# Patient Record
Sex: Female | Born: 1945 | Race: White | Hispanic: No
Health system: Southern US, Community
[De-identification: ages and names within clinical notes are randomized; demographics above are authoritative.]

## PROBLEM LIST (undated history)

## (undated) DIAGNOSIS — M069 Rheumatoid arthritis, unspecified: Secondary | ICD-10-CM

## (undated) DIAGNOSIS — M519 Unspecified thoracic, thoracolumbar and lumbosacral intervertebral disc disorder: Secondary | ICD-10-CM

## (undated) DIAGNOSIS — N189 Chronic kidney disease, unspecified: Secondary | ICD-10-CM

## (undated) DIAGNOSIS — E039 Hypothyroidism, unspecified: Secondary | ICD-10-CM

## (undated) DIAGNOSIS — F32A Depression, unspecified: Secondary | ICD-10-CM

## (undated) DIAGNOSIS — A6920 Lyme disease, unspecified: Secondary | ICD-10-CM

## (undated) DIAGNOSIS — J189 Pneumonia, unspecified organism: Secondary | ICD-10-CM

## (undated) DIAGNOSIS — G709 Myoneural disorder, unspecified: Secondary | ICD-10-CM

## (undated) DIAGNOSIS — M1990 Unspecified osteoarthritis, unspecified site: Secondary | ICD-10-CM

## (undated) DIAGNOSIS — Z87442 Personal history of urinary calculi: Secondary | ICD-10-CM

## (undated) DIAGNOSIS — M797 Fibromyalgia: Secondary | ICD-10-CM

## (undated) DIAGNOSIS — J342 Deviated nasal septum: Secondary | ICD-10-CM

## (undated) DIAGNOSIS — R5382 Chronic fatigue, unspecified: Secondary | ICD-10-CM

## (undated) DIAGNOSIS — N3010 Interstitial cystitis (chronic) without hematuria: Secondary | ICD-10-CM

## (undated) DIAGNOSIS — J449 Chronic obstructive pulmonary disease, unspecified: Secondary | ICD-10-CM

## (undated) DIAGNOSIS — M810 Age-related osteoporosis without current pathological fracture: Secondary | ICD-10-CM

## (undated) DIAGNOSIS — D649 Anemia, unspecified: Secondary | ICD-10-CM

## (undated) DIAGNOSIS — F419 Anxiety disorder, unspecified: Secondary | ICD-10-CM

## (undated) DIAGNOSIS — K219 Gastro-esophageal reflux disease without esophagitis: Secondary | ICD-10-CM

## (undated) DIAGNOSIS — F329 Major depressive disorder, single episode, unspecified: Secondary | ICD-10-CM

## (undated) DIAGNOSIS — J45909 Unspecified asthma, uncomplicated: Secondary | ICD-10-CM

## (undated) HISTORY — PX: EYE SURGERY: SHX253

## (undated) HISTORY — DX: Anxiety disorder, unspecified: F41.9

## (undated) HISTORY — DX: Unspecified asthma, uncomplicated: J45.909

## (undated) HISTORY — PX: BREAST SURGERY: SHX581

## (undated) HISTORY — DX: Chronic fatigue, unspecified: R53.82

---

## 1976-02-12 HISTORY — PX: APPENDECTOMY: SHX54

## 1996-02-12 HISTORY — PX: OTHER SURGICAL HISTORY: SHX169

## 1999-07-27 ENCOUNTER — Encounter: Payer: Self-pay | Admitting: Urology

## 1999-07-27 ENCOUNTER — Ambulatory Visit (HOSPITAL_COMMUNITY): Admission: RE | Admit: 1999-07-27 | Discharge: 1999-07-27 | Payer: Self-pay | Admitting: Urology

## 1999-11-19 ENCOUNTER — Encounter: Payer: Self-pay | Admitting: Urology

## 1999-11-19 ENCOUNTER — Encounter: Admission: RE | Admit: 1999-11-19 | Discharge: 1999-11-19 | Payer: Self-pay | Admitting: Urology

## 2003-02-12 HISTORY — PX: COSMETIC SURGERY: SHX468

## 2007-07-16 ENCOUNTER — Other Ambulatory Visit: Admission: RE | Admit: 2007-07-16 | Discharge: 2007-07-16 | Payer: Self-pay | Admitting: Obstetrics & Gynecology

## 2007-10-23 ENCOUNTER — Encounter: Admission: RE | Admit: 2007-10-23 | Discharge: 2007-10-23 | Payer: Self-pay | Admitting: Internal Medicine

## 2007-12-18 ENCOUNTER — Encounter: Admission: RE | Admit: 2007-12-18 | Discharge: 2007-12-18 | Payer: Self-pay | Admitting: Internal Medicine

## 2009-06-12 ENCOUNTER — Encounter: Admission: RE | Admit: 2009-06-12 | Discharge: 2009-08-10 | Payer: Self-pay | Admitting: Internal Medicine

## 2009-07-21 ENCOUNTER — Ambulatory Visit (HOSPITAL_COMMUNITY): Admission: RE | Admit: 2009-07-21 | Discharge: 2009-07-21 | Payer: Self-pay | Admitting: Internal Medicine

## 2010-06-29 NOTE — Op Note (Signed)
[facility]  Patient:    Pamela Larson, Pamela Larson                     MRN: [id number] Proc. Date: 07/27/99 Adm. Date:  [id number] Disc. Date: [id number] Attending:  Londell Moh CC:         [name], M.D.             Eliezer Bottom, M.D.                           Operative Report  SERVICE:  Urology.  PREOPERATIVE DIAGNOSES:  Chronic painful bladder syndrome consistent with interstitial cystitis. Severe clinical depression.  POSTOPERATIVE DIAGNOSES:  Chronic painful bladder syndrome consistent with interstitial cystitis. Severe clinical depression.  PROCEDURES:  Cystoscopy, urethral calibration and hydrodistention of the bladder, Marcaine and Pyridium installation, Marcaine and Kenalog injections.  SURGEON:  [name], M.D.  ANESTHESIA:  General.  COMPLICATIONS:  None.  DRAINS:  None.  BRIEF HISTORY:  This 65 year old female is known to have interstitial cystitis based on her initial history and physical which showed somewhat diminished bladder capacity along with glomerulations. The patient has been on combination therapy consisting of Elmiron and Atarax plus other medications. The patient has required several different antidepressants because of severe clinical depression and is actually out on disability due to both her bladder symptoms and her depression. The patients IC symptoms have flared significantly recently despite the fact that she has not changed her medications. She has requested that hydrodistention be performed. We note that she has done intravesical therapy in the past and recent course of DMSL was not helpful. The understands the risks and benefits of the procedure including the possibility that there could be no improvement in her symptoms with distention. She also is aware of the fact that she will have at least 2 weeks of increasing symptoms prior to any effect from the procedure. She gave full and informed  consent.  DESCRIPTION OF PROCEDURE:  After successful induction of general anesthesia, the patient was placed in the dorsal lithotomy position, prepped with Betadine and draped in the usual sterile fashion. The urethra was calibrated to 30 [place] with [employer] sounds. There was no evidence of stenosis or stricture. The cystoscope was inserted, the bladder was carefully inspected and was free of any tumor stones. Both ureteral orifices normal in configuration and location. A previous biopsy site was noted. This was unremarkable in its appearance. The patients bladder was then filled to a pressure of ______ water for 5 minutes and when the bladder was drained no real glomerulations were seen. This has been noted on her last hydrodistention. It appears that through the use of Elmiron therapy, her ______ has stabilized and that glomerulations are no longer present. The capacity is now increased up to the normal level and is at 1100 cc. This would suggest that she has had a strong response to the medication program. The patients bladder was drained and a mixture of Marcaine and Pyridium was left in the bladder. Marcaine and Kenalog were injected periurethrally, vaginal gauze was applied. She was given a Toradol injection. She was taken to the recovery room in good condition. She will give a B&O suppository as well as appropriate supportive pain medicine. She will be sent home with a prescription for Lorcet plus to taken as needed for pain and doxycycline. She will continue on all her other medications. The decision  as to the appropriate antidepressive therapy will need to be made by Dr. Evelene Croon and we will make no changes in her protocol at this time. D:  07/27/99 TD:  07/31/99 Job: [id number]

## 2011-02-12 DIAGNOSIS — M519 Unspecified thoracic, thoracolumbar and lumbosacral intervertebral disc disorder: Secondary | ICD-10-CM

## 2011-02-12 HISTORY — DX: Unspecified thoracic, thoracolumbar and lumbosacral intervertebral disc disorder: M51.9

## 2011-02-27 DIAGNOSIS — F4323 Adjustment disorder with mixed anxiety and depressed mood: Secondary | ICD-10-CM | POA: Diagnosis not present

## 2011-03-12 DIAGNOSIS — B370 Candidal stomatitis: Secondary | ICD-10-CM | POA: Diagnosis not present

## 2011-03-12 DIAGNOSIS — R5382 Chronic fatigue, unspecified: Secondary | ICD-10-CM | POA: Diagnosis not present

## 2011-03-12 DIAGNOSIS — R05 Cough: Secondary | ICD-10-CM | POA: Diagnosis not present

## 2011-03-13 DIAGNOSIS — F4323 Adjustment disorder with mixed anxiety and depressed mood: Secondary | ICD-10-CM | POA: Diagnosis not present

## 2011-04-19 DIAGNOSIS — M5126 Other intervertebral disc displacement, lumbar region: Secondary | ICD-10-CM | POA: Diagnosis not present

## 2011-05-06 DIAGNOSIS — M5126 Other intervertebral disc displacement, lumbar region: Secondary | ICD-10-CM | POA: Diagnosis not present

## 2011-05-23 DIAGNOSIS — M19049 Primary osteoarthritis, unspecified hand: Secondary | ICD-10-CM | POA: Diagnosis not present

## 2011-05-23 DIAGNOSIS — M069 Rheumatoid arthritis, unspecified: Secondary | ICD-10-CM | POA: Diagnosis not present

## 2011-05-23 DIAGNOSIS — R5383 Other fatigue: Secondary | ICD-10-CM | POA: Diagnosis not present

## 2011-05-23 DIAGNOSIS — IMO0001 Reserved for inherently not codable concepts without codable children: Secondary | ICD-10-CM | POA: Diagnosis not present

## 2011-05-23 DIAGNOSIS — R5381 Other malaise: Secondary | ICD-10-CM | POA: Diagnosis not present

## 2011-05-24 DIAGNOSIS — M5126 Other intervertebral disc displacement, lumbar region: Secondary | ICD-10-CM | POA: Diagnosis not present

## 2011-05-29 DIAGNOSIS — M5126 Other intervertebral disc displacement, lumbar region: Secondary | ICD-10-CM | POA: Diagnosis not present

## 2011-05-30 ENCOUNTER — Other Ambulatory Visit: Payer: Self-pay | Admitting: Otolaryngology

## 2011-06-05 ENCOUNTER — Encounter (HOSPITAL_COMMUNITY): Payer: Self-pay | Admitting: Pharmacy Technician

## 2011-06-06 ENCOUNTER — Ambulatory Visit (HOSPITAL_COMMUNITY)
Admission: RE | Admit: 2011-06-06 | Discharge: 2011-06-06 | Disposition: A | Discharge status: Home / Self Care | Payer: Medicare Other | Source: Ambulatory Visit | Attending: Otolaryngology | Admitting: Otolaryngology

## 2011-06-06 ENCOUNTER — Encounter (HOSPITAL_COMMUNITY)
Admission: RE | Admit: 2011-06-06 | Discharge: 2011-06-06 | Disposition: A | Discharge status: Home / Self Care | Payer: Medicare Other | Source: Ambulatory Visit | Attending: Specialist | Admitting: Specialist

## 2011-06-06 ENCOUNTER — Encounter (HOSPITAL_COMMUNITY): Payer: Self-pay

## 2011-06-06 DIAGNOSIS — Z01812 Encounter for preprocedural laboratory examination: Secondary | ICD-10-CM | POA: Insufficient documentation

## 2011-06-06 DIAGNOSIS — Z01818 Encounter for other preprocedural examination: Secondary | ICD-10-CM | POA: Insufficient documentation

## 2011-06-06 DIAGNOSIS — M5126 Other intervertebral disc displacement, lumbar region: Secondary | ICD-10-CM | POA: Diagnosis not present

## 2011-06-06 HISTORY — DX: Anemia, unspecified: D64.9

## 2011-06-06 HISTORY — DX: Hypothyroidism, unspecified: E03.9

## 2011-06-06 HISTORY — DX: Myoneural disorder, unspecified: G70.9

## 2011-06-06 HISTORY — DX: Major depressive disorder, single episode, unspecified: F32.9

## 2011-06-06 HISTORY — DX: Depression, unspecified: F32.A

## 2011-06-06 HISTORY — DX: Chronic kidney disease, unspecified: N18.9

## 2011-06-06 HISTORY — DX: Unspecified osteoarthritis, unspecified site: M19.90

## 2011-06-06 HISTORY — DX: Pneumonia, unspecified organism: J18.9

## 2011-06-06 HISTORY — DX: Lyme disease, unspecified: A69.20

## 2011-06-06 LAB — CBC
Hemoglobin: 13.9 g/dL (ref 12.0–15.0)
MCV: 98.6 fL (ref 78.0–100.0)
Platelets: 327 10*3/uL (ref 150–400)
RBC: 4.15 MIL/uL (ref 3.87–5.11)
WBC: 5.2 10*3/uL (ref 4.0–10.5)

## 2011-06-06 LAB — DIFFERENTIAL
Eosinophils Relative: 1 % (ref 0–5)
Lymphocytes Relative: 37 % (ref 12–46)
Lymphs Abs: 1.9 10*3/uL (ref 0.7–4.0)
Monocytes Absolute: 0.6 10*3/uL (ref 0.1–1.0)
Monocytes Relative: 11 % (ref 3–12)

## 2011-06-06 LAB — COMPREHENSIVE METABOLIC PANEL
ALT: 10 U/L (ref 0–35)
AST: 17 U/L (ref 0–37)
CO2: 26 mEq/L (ref 19–32)
Chloride: 99 mEq/L (ref 96–112)
Creatinine, Ser: 0.6 mg/dL (ref 0.50–1.10)
GFR calc non Af Amer: >90 mL/min (ref >90)
Sodium: 137 mEq/L (ref 135–145)
Total Bilirubin: 0.3 mg/dL (ref 0.3–1.2)

## 2011-06-06 LAB — URINALYSIS, ROUTINE W REFLEX MICROSCOPIC
Hgb urine dipstick: NEGATIVE
Protein, ur: NEGATIVE mg/dL
Urobilinogen, UA: 0.2 mg/dL (ref 0.0–1.0)

## 2011-06-06 LAB — URINE MICROSCOPIC-ADD ON

## 2011-06-06 NOTE — Pre-Procedure Instructions (Signed)
06/06/11 Left message with Harley Hallmark regarding urinalysis with leukocytes and few bacteria in urine microscopic.

## 2011-06-06 NOTE — Patient Instructions (Signed)
20 Pamela Larson  06/06/2011   Your procedure is scheduled on:  06/12/11 1030am-1219pm  Report to [facility] at 0800 AM.  Call this number if you have problems the morning of surgery: [phone]   Remember:   Do not eat food:After Midnight.  May have clear liquids:until Midnight .  Marland Kitchen  Take these medicines the morning of surgery with A SIP OF WATER:   Do not wear jewelry, make-up or nail polish.  Do not wear lotions, powders, or perfumes.   Do not shave 48 hours prior to surgery.  Do not bring valuables to the hospital.  Contacts, dentures or bridgework may not be worn into surgery.  Leave suitcase in the car. After surgery it may be brought to your room.  For patients admitted to the hospital, checkout time is 11:00 AM the day of discharge.   Special Instructions: CHG Shower Use Special Wash: 1/2 bottle night before surgery and 1/2 bottle morning of surgery. shower chin to toes with CHG.  Wash face and private parts with regular soap.    Please read over the following fact sheets that you were given: MRSA Information, Incentive Spirometry Fact Sheet, coughing and deep breathing exercises, leg exercises

## 2011-06-07 ENCOUNTER — Other Ambulatory Visit: Payer: Self-pay | Admitting: Neurosurgery

## 2011-06-07 DIAGNOSIS — M545 Low back pain, unspecified: Secondary | ICD-10-CM | POA: Diagnosis not present

## 2011-06-07 DIAGNOSIS — M5137 Other intervertebral disc degeneration, lumbosacral region: Secondary | ICD-10-CM | POA: Diagnosis not present

## 2011-06-07 DIAGNOSIS — M47817 Spondylosis without myelopathy or radiculopathy, lumbosacral region: Secondary | ICD-10-CM | POA: Diagnosis not present

## 2011-06-07 DIAGNOSIS — M5126 Other intervertebral disc displacement, lumbar region: Secondary | ICD-10-CM | POA: Diagnosis not present

## 2011-06-07 DIAGNOSIS — IMO0002 Reserved for concepts with insufficient information to code with codable children: Secondary | ICD-10-CM | POA: Diagnosis not present

## 2011-06-09 MED ORDER — CEFAZOLIN SODIUM 1-5 GM-% IV SOLN: 1.0000 g | INTRAVENOUS | Status: DC

## 2011-06-09 MED ORDER — CHLORHEXIDINE GLUCONATE 4 % EX LIQD: 60.0000 mL | Freq: Once | CUTANEOUS | Status: DC

## 2011-06-10 ENCOUNTER — Inpatient Hospital Stay (HOSPITAL_COMMUNITY): Payer: Medicare Other

## 2011-06-10 ENCOUNTER — Encounter (HOSPITAL_COMMUNITY): Payer: Self-pay | Admitting: *Deleted

## 2011-06-10 ENCOUNTER — Encounter (HOSPITAL_COMMUNITY)
Admission: RE | Disposition: A | Discharge status: Home / Self Care | Payer: Self-pay | Source: Ambulatory Visit | Attending: Neurosurgery

## 2011-06-10 ENCOUNTER — Encounter (HOSPITAL_COMMUNITY): Payer: Self-pay | Admitting: Anesthesiology

## 2011-06-10 ENCOUNTER — Inpatient Hospital Stay (HOSPITAL_COMMUNITY): Payer: Medicare Other | Admitting: Anesthesiology

## 2011-06-10 ENCOUNTER — Inpatient Hospital Stay (HOSPITAL_COMMUNITY)
Admission: RE | Admit: 2011-06-10 | Discharge: 2011-06-11 | DRG: 491 | Disposition: A | Discharge status: Home / Self Care | Payer: Medicare Other | Source: Ambulatory Visit | Attending: Neurosurgery | Admitting: Neurosurgery

## 2011-06-10 DIAGNOSIS — M51379 Other intervertebral disc degeneration, lumbosacral region without mention of lumbar back pain or lower extremity pain: Secondary | ICD-10-CM | POA: Diagnosis present

## 2011-06-10 DIAGNOSIS — F329 Major depressive disorder, single episode, unspecified: Secondary | ICD-10-CM | POA: Diagnosis present

## 2011-06-10 DIAGNOSIS — M519 Unspecified thoracic, thoracolumbar and lumbosacral intervertebral disc disorder: Secondary | ICD-10-CM | POA: Diagnosis not present

## 2011-06-10 DIAGNOSIS — E039 Hypothyroidism, unspecified: Secondary | ICD-10-CM | POA: Diagnosis not present

## 2011-06-10 DIAGNOSIS — M069 Rheumatoid arthritis, unspecified: Secondary | ICD-10-CM | POA: Diagnosis present

## 2011-06-10 DIAGNOSIS — M5137 Other intervertebral disc degeneration, lumbosacral region: Secondary | ICD-10-CM | POA: Diagnosis not present

## 2011-06-10 DIAGNOSIS — D510 Vitamin B12 deficiency anemia due to intrinsic factor deficiency: Secondary | ICD-10-CM | POA: Diagnosis present

## 2011-06-10 DIAGNOSIS — N189 Chronic kidney disease, unspecified: Secondary | ICD-10-CM | POA: Diagnosis not present

## 2011-06-10 DIAGNOSIS — M5126 Other intervertebral disc displacement, lumbar region: Principal | ICD-10-CM | POA: Diagnosis present

## 2011-06-10 DIAGNOSIS — Z79899 Other long term (current) drug therapy: Secondary | ICD-10-CM

## 2011-06-10 DIAGNOSIS — IMO0001 Reserved for inherently not codable concepts without codable children: Secondary | ICD-10-CM | POA: Diagnosis not present

## 2011-06-10 DIAGNOSIS — M47817 Spondylosis without myelopathy or radiculopathy, lumbosacral region: Secondary | ICD-10-CM | POA: Diagnosis present

## 2011-06-10 DIAGNOSIS — F3289 Other specified depressive episodes: Secondary | ICD-10-CM | POA: Diagnosis present

## 2011-06-10 DIAGNOSIS — IMO0002 Reserved for concepts with insufficient information to code with codable children: Secondary | ICD-10-CM | POA: Diagnosis not present

## 2011-06-10 HISTORY — DX: Fibromyalgia: M79.7

## 2011-06-10 HISTORY — PX: LUMBAR LAMINECTOMY/DECOMPRESSION MICRODISCECTOMY: SHX5026

## 2011-06-10 SURGERY — LUMBAR LAMINECTOMY/DECOMPRESSION MICRODISCECTOMY 1 LEVEL
Anesthesia: General | Laterality: Left | Wound class: Clean

## 2011-06-10 MED ORDER — PANTOPRAZOLE SODIUM 40 MG PO TBEC
40.0000 mg | DELAYED_RELEASE_TABLET | Freq: Every day | ORAL | Status: DC
  Filled 2011-06-10: qty 1

## 2011-06-10 MED ORDER — BISACODYL 10 MG RE SUPP: 10.0000 mg | Freq: Every day | RECTAL | Status: DC | PRN

## 2011-06-10 MED ORDER — GLYCOPYRROLATE 0.2 MG/ML IJ SOLN
INTRAMUSCULAR | Status: DC | PRN
  Administered 2011-06-10: .5 mg via INTRAVENOUS

## 2011-06-10 MED ORDER — MENTHOL 3 MG MT LOZG: 1.0000 | LOZENGE | OROMUCOSAL | Status: DC | PRN

## 2011-06-10 MED ORDER — SODIUM CHLORIDE 0.9 % IR SOLN
Status: DC | PRN
  Administered 2011-06-10: 12:00:00

## 2011-06-10 MED ORDER — ONDANSETRON HCL 4 MG/2ML IJ SOLN: 4.0000 mg | Freq: Once | INTRAMUSCULAR | Status: DC | PRN

## 2011-06-10 MED ORDER — HEMOSTATIC AGENTS (NO CHARGE) OPTIME
TOPICAL | Status: DC | PRN
  Administered 2011-06-10: 1 via TOPICAL

## 2011-06-10 MED ORDER — VANCOMYCIN HCL IN DEXTROSE 1-5 GM/200ML-% IV SOLN: 1000.0000 mg | INTRAVENOUS | Status: DC

## 2011-06-10 MED ORDER — HYDROCODONE-ACETAMINOPHEN 5-325 MG PO TABS
1.0000 | ORAL_TABLET | ORAL | Status: DC | PRN
  Administered 2011-06-10 (×3): 1 via ORAL
  Administered 2011-06-11: 2 via ORAL
  Administered 2011-06-11: 1 via ORAL
  Filled 2011-06-10: qty 2
  Filled 2011-06-10 (×4): qty 1

## 2011-06-10 MED ORDER — THROMBIN 5000 UNITS EX SOLR
CUTANEOUS | Status: DC | PRN
  Administered 2011-06-10 (×2): 5000 [IU] via TOPICAL

## 2011-06-10 MED ORDER — KETOROLAC TROMETHAMINE 30 MG/ML IJ SOLN
INTRAMUSCULAR | Status: AC
  Administered 2011-06-10: 30 mg
  Filled 2011-06-10: qty 1

## 2011-06-10 MED ORDER — HYDROMORPHONE HCL PF 1 MG/ML IJ SOLN
INTRAMUSCULAR | Status: AC
  Filled 2011-06-10: qty 1

## 2011-06-10 MED ORDER — FENTANYL CITRATE 0.05 MG/ML IJ SOLN
INTRAMUSCULAR | Status: DC | PRN
  Administered 2011-06-10: 100 ug via INTRAVENOUS
  Administered 2011-06-10: 150 ug via INTRAVENOUS

## 2011-06-10 MED ORDER — LIDOCAINE HCL 4 % MT SOLN
OROMUCOSAL | Status: DC | PRN
  Administered 2011-06-10: 4 mL via TOPICAL

## 2011-06-10 MED ORDER — ALPRAZOLAM 0.25 MG PO TABS
0.2500 mg | ORAL_TABLET | Freq: Three times a day (TID) | ORAL | Status: DC | PRN
  Administered 2011-06-10: 0.25 mg via ORAL
  Filled 2011-06-10: qty 1

## 2011-06-10 MED ORDER — FENTANYL CITRATE 0.05 MG/ML IJ SOLN
INTRAMUSCULAR | Status: DC | PRN
  Administered 2011-06-10: 100 ug via INTRAVENOUS

## 2011-06-10 MED ORDER — ZOLPIDEM TARTRATE 5 MG PO TABS: 10.0000 mg | ORAL_TABLET | Freq: Every evening | ORAL | Status: DC | PRN

## 2011-06-10 MED ORDER — MORPHINE SULFATE 4 MG/ML IJ SOLN: 4.0000 mg | INTRAMUSCULAR | Status: DC | PRN

## 2011-06-10 MED ORDER — PROPOFOL 10 MG/ML IV BOLUS
INTRAVENOUS | Status: DC | PRN
  Administered 2011-06-10: 200 mg via INTRAVENOUS

## 2011-06-10 MED ORDER — KETOROLAC TROMETHAMINE 30 MG/ML IJ SOLN
30.0000 mg | Freq: Four times a day (QID) | INTRAMUSCULAR | Status: DC
  Administered 2011-06-10 – 2011-06-11 (×3): 30 mg via INTRAVENOUS
  Filled 2011-06-10 (×7): qty 1

## 2011-06-10 MED ORDER — LIDOCAINE HCL (CARDIAC) 20 MG/ML IV SOLN
INTRAVENOUS | Status: DC | PRN
  Administered 2011-06-10: 40 mg via INTRAVENOUS

## 2011-06-10 MED ORDER — VECURONIUM BROMIDE 10 MG IV SOLR
INTRAVENOUS | Status: DC | PRN
  Administered 2011-06-10: 4 mg via INTRAVENOUS
  Administered 2011-06-10: 1 mg via INTRAVENOUS

## 2011-06-10 MED ORDER — BACITRACIN 50000 UNITS IM SOLR
INTRAMUSCULAR | Status: AC
  Filled 2011-06-10: qty 1

## 2011-06-10 MED ORDER — METHYLPREDNISOLONE ACETATE 80 MG/ML IJ SUSP
INTRAMUSCULAR | Status: DC | PRN
  Administered 2011-06-10: 80 mg

## 2011-06-10 MED ORDER — LIDOCAINE-EPINEPHRINE 1 %-1:100000 IJ SOLN
INTRAMUSCULAR | Status: DC | PRN
  Administered 2011-06-10: 10 mL

## 2011-06-10 MED ORDER — PHENOL 1.4 % MT LIQD: 1.0000 | OROMUCOSAL | Status: DC | PRN

## 2011-06-10 MED ORDER — OXYCODONE-ACETAMINOPHEN 5-325 MG PO TABS: 1.0000 | ORAL_TABLET | ORAL | Status: DC | PRN

## 2011-06-10 MED ORDER — SODIUM CHLORIDE 0.9 % IV SOLN
INTRAVENOUS | Status: AC
  Filled 2011-06-10: qty 500

## 2011-06-10 MED ORDER — HYDROXYZINE HCL 50 MG/ML IM SOLN: 50.0000 mg | INTRAMUSCULAR | Status: DC | PRN

## 2011-06-10 MED ORDER — LACTATED RINGERS IV SOLN: INTRAVENOUS | Status: DC

## 2011-06-10 MED ORDER — ACETAMINOPHEN 650 MG RE SUPP: 650.0000 mg | RECTAL | Status: DC | PRN

## 2011-06-10 MED ORDER — ALUM & MAG HYDROXIDE-SIMETH 200-200-20 MG/5ML PO SUSP: 30.0000 mL | Freq: Four times a day (QID) | ORAL | Status: DC | PRN

## 2011-06-10 MED ORDER — FLUOXETINE HCL 20 MG PO CAPS
20.0000 mg | ORAL_CAPSULE | Freq: Two times a day (BID) | ORAL | Status: DC
  Administered 2011-06-10 – 2011-06-11 (×3): 20 mg via ORAL
  Filled 2011-06-10 (×5): qty 1

## 2011-06-10 MED ORDER — ACETAMINOPHEN 325 MG PO TABS: 650.0000 mg | ORAL_TABLET | ORAL | Status: DC | PRN

## 2011-06-10 MED ORDER — CEFAZOLIN SODIUM 1-5 GM-% IV SOLN
INTRAVENOUS | Status: AC
  Administered 2011-06-10: 1 g via INTRAVENOUS
  Filled 2011-06-10: qty 50

## 2011-06-10 MED ORDER — HYDROXYZINE HCL 25 MG PO TABS: 50.0000 mg | ORAL_TABLET | ORAL | Status: DC | PRN

## 2011-06-10 MED ORDER — EPHEDRINE SULFATE 50 MG/ML IJ SOLN
INTRAMUSCULAR | Status: DC | PRN
  Administered 2011-06-10 (×2): 10 mg via INTRAVENOUS

## 2011-06-10 MED ORDER — ONDANSETRON HCL 4 MG/2ML IJ SOLN
INTRAMUSCULAR | Status: DC | PRN
  Administered 2011-06-10: 4 mg via INTRAVENOUS

## 2011-06-10 MED ORDER — SODIUM CHLORIDE 0.9 % IJ SOLN: 3.0000 mL | INTRAMUSCULAR | Status: DC | PRN

## 2011-06-10 MED ORDER — HYDROMORPHONE HCL PF 1 MG/ML IJ SOLN
0.2500 mg | INTRAMUSCULAR | Status: DC | PRN
  Administered 2011-06-10: 0.25 mg via INTRAVENOUS
  Administered 2011-06-10: 0.5 mg via INTRAVENOUS

## 2011-06-10 MED ORDER — LACTATED RINGERS IV SOLN
INTRAVENOUS | Status: DC | PRN
  Administered 2011-06-10 (×2): via INTRAVENOUS

## 2011-06-10 MED ORDER — VANCOMYCIN HCL IN DEXTROSE 1-5 GM/200ML-% IV SOLN: 1000.0000 mg | Freq: Once | INTRAVENOUS | Status: DC

## 2011-06-10 MED ORDER — MAGNESIUM HYDROXIDE 400 MG/5ML PO SUSP: 30.0000 mL | Freq: Every day | ORAL | Status: DC | PRN

## 2011-06-10 MED ORDER — FENTANYL CITRATE 0.05 MG/ML IJ SOLN
INTRAMUSCULAR | Status: AC
  Filled 2011-06-10: qty 2

## 2011-06-10 MED ORDER — NEOSTIGMINE METHYLSULFATE 1 MG/ML IJ SOLN
INTRAMUSCULAR | Status: DC | PRN
  Administered 2011-06-10: 3 mg via INTRAVENOUS

## 2011-06-10 MED ORDER — BUPIVACAINE HCL (PF) 0.5 % IJ SOLN
INTRAMUSCULAR | Status: DC | PRN
  Administered 2011-06-10: 10 mL

## 2011-06-10 MED ORDER — SODIUM CHLORIDE 0.9 % IJ SOLN
3.0000 mL | Freq: Two times a day (BID) | INTRAMUSCULAR | Status: DC
  Administered 2011-06-10 (×2): 3 mL via INTRAVENOUS

## 2011-06-10 MED ORDER — CIPROFLOXACIN HCL 250 MG PO TABS
250.0000 mg | ORAL_TABLET | Freq: Two times a day (BID) | ORAL | Status: DC
  Administered 2011-06-10 – 2011-06-11 (×3): 250 mg via ORAL
  Filled 2011-06-10 (×5): qty 1

## 2011-06-10 MED ORDER — KETOROLAC TROMETHAMINE 30 MG/ML IJ SOLN: 30.0000 mg | Freq: Once | INTRAMUSCULAR | Status: DC

## 2011-06-10 MED ORDER — LEVOTHYROXINE SODIUM 125 MCG PO TABS
125.0000 ug | ORAL_TABLET | Freq: Every day | ORAL | Status: DC
  Administered 2011-06-10 – 2011-06-11 (×2): 125 ug via ORAL
  Filled 2011-06-10 (×4): qty 1

## 2011-06-10 MED ORDER — CYCLOBENZAPRINE HCL 10 MG PO TABS
10.0000 mg | ORAL_TABLET | Freq: Three times a day (TID) | ORAL | Status: DC | PRN
  Administered 2011-06-10: 10 mg via ORAL
  Filled 2011-06-10: qty 1

## 2011-06-10 MED ORDER — DEXAMETHASONE SODIUM PHOSPHATE 4 MG/ML IJ SOLN
INTRAMUSCULAR | Status: DC | PRN
  Administered 2011-06-10: 4 mg via INTRAVENOUS

## 2011-06-10 MED ORDER — KCL IN DEXTROSE-NACL 20-5-0.45 MEQ/L-%-% IV SOLN
INTRAVENOUS | Status: DC
  Administered 2011-06-10: 16:00:00 via INTRAVENOUS
  Filled 2011-06-10 (×7): qty 1000

## 2011-06-10 SURGICAL SUPPLY — 60 items
ADH SKN CLS APL DERMABOND .7 (GAUZE/BANDAGES/DRESSINGS) ×2
APL SKNCLS STERI-STRIP NONHPOA (GAUZE/BANDAGES/DRESSINGS)
BAG DECANTER FOR FLEXI CONT (MISCELLANEOUS) ×2 IMPLANT
BENZOIN TINCTURE PRP APPL 2/3 (GAUZE/BANDAGES/DRESSINGS) IMPLANT
BLADE SURG ROTATE 9660 (MISCELLANEOUS) IMPLANT
BRUSH SCRUB EZ PLAIN DRY (MISCELLANEOUS) ×2 IMPLANT
BUR ACORN 6.0 ACORN (BURR) ×1 IMPLANT
BUR ACRON 5.0MM COATED (BURR) ×2 IMPLANT
BUR MATCHSTICK NEURO 3.0 LAGG (BURR) ×2 IMPLANT
CANISTER SUCTION 2500CC (MISCELLANEOUS) ×2 IMPLANT
CLOTH BEACON ORANGE TIMEOUT ST (SAFETY) ×2 IMPLANT
CONT SPEC 4OZ CLIKSEAL STRL BL (MISCELLANEOUS) IMPLANT
DERMABOND ADVANCED (GAUZE/BANDAGES/DRESSINGS) ×2
DERMABOND ADVANCED .7 DNX12 (GAUZE/BANDAGES/DRESSINGS) ×2 IMPLANT
DRAPE LAPAROTOMY 100X72X124 (DRAPES) ×2 IMPLANT
DRAPE MICROSCOPE LEICA (MISCELLANEOUS) ×2 IMPLANT
DRAPE POUCH INSTRU U-SHP 10X18 (DRAPES) ×2 IMPLANT
DRSG EMULSION OIL 3X3 NADH (GAUZE/BANDAGES/DRESSINGS) IMPLANT
ELECT REM PT RETURN 9FT ADLT (ELECTROSURGICAL) ×2
ELECTRODE REM PT RTRN 9FT ADLT (ELECTROSURGICAL) ×1 IMPLANT
GAUZE SPONGE 4X4 16PLY XRAY LF (GAUZE/BANDAGES/DRESSINGS) IMPLANT
GLOVE BIOGEL PI IND STRL 7.0 (GLOVE) IMPLANT
GLOVE BIOGEL PI IND STRL 8 (GLOVE) ×1 IMPLANT
GLOVE BIOGEL PI INDICATOR 7.0 (GLOVE) ×1
GLOVE BIOGEL PI INDICATOR 8 (GLOVE) ×1
GLOVE ECLIPSE 7.5 STRL STRAW (GLOVE) ×2 IMPLANT
GLOVE EXAM NITRILE LRG STRL (GLOVE) IMPLANT
GLOVE EXAM NITRILE MD LF STRL (GLOVE) IMPLANT
GLOVE EXAM NITRILE XL STR (GLOVE) IMPLANT
GLOVE EXAM NITRILE XS STR PU (GLOVE) IMPLANT
GLOVE SURG SS PI 6.5 STRL IVOR (GLOVE) ×4 IMPLANT
GOWN BRE IMP SLV AUR LG STRL (GOWN DISPOSABLE) ×3 IMPLANT
GOWN BRE IMP SLV AUR XL STRL (GOWN DISPOSABLE) ×2 IMPLANT
GOWN STRL REIN 2XL LVL4 (GOWN DISPOSABLE) IMPLANT
KIT BASIN OR (CUSTOM PROCEDURE TRAY) ×2 IMPLANT
KIT ROOM TURNOVER OR (KITS) ×2 IMPLANT
NDL HYPO 18GX1.5 BLUNT FILL (NEEDLE) IMPLANT
NDL SPNL 22GX3.5 QUINCKE BK (NEEDLE) ×1 IMPLANT
NEEDLE HYPO 18GX1.5 BLUNT FILL (NEEDLE) ×2 IMPLANT
NEEDLE SPNL 18GX3.5 QUINCKE PK (NEEDLE) ×2 IMPLANT
NEEDLE SPNL 22GX3.5 QUINCKE BK (NEEDLE) ×2 IMPLANT
NS IRRIG 1000ML POUR BTL (IV SOLUTION) ×2 IMPLANT
PACK LAMINECTOMY NEURO (CUSTOM PROCEDURE TRAY) ×2 IMPLANT
PAD ARMBOARD 7.5X6 YLW CONV (MISCELLANEOUS) ×6 IMPLANT
PATTIES SURGICAL .5 X1 (DISPOSABLE) ×2 IMPLANT
RUBBERBAND STERILE (MISCELLANEOUS) ×4 IMPLANT
SPONGE GAUZE 4X4 12PLY (GAUZE/BANDAGES/DRESSINGS) IMPLANT
SPONGE LAP 4X18 X RAY DECT (DISPOSABLE) IMPLANT
SPONGE SURGIFOAM ABS GEL SZ50 (HEMOSTASIS) ×2 IMPLANT
STRIP CLOSURE SKIN 1/2X4 (GAUZE/BANDAGES/DRESSINGS) IMPLANT
SUT PROLENE 6 0 BV (SUTURE) IMPLANT
SUT VIC AB 1 CT1 18XBRD ANBCTR (SUTURE) ×1 IMPLANT
SUT VIC AB 1 CT1 8-18 (SUTURE) ×2
SUT VIC AB 2-0 CP2 18 (SUTURE) ×2 IMPLANT
SUT VIC AB 3-0 SH 8-18 (SUTURE) ×1 IMPLANT
SYR 20CC LL (SYRINGE) ×2 IMPLANT
SYR 5ML LL (SYRINGE) ×1 IMPLANT
TOWEL OR 17X24 6PK STRL BLUE (TOWEL DISPOSABLE) ×2 IMPLANT
TOWEL OR 17X26 10 PK STRL BLUE (TOWEL DISPOSABLE) ×2 IMPLANT
WATER STERILE IRR 1000ML POUR (IV SOLUTION) ×2 IMPLANT

## 2011-06-10 NOTE — Anesthesia Procedure Notes (Signed)
Procedure Name: Intubation Date/Time: 06/10/2011 10:50 AM Performed by: Marena Chancy Pre-anesthesia Checklist: Patient identified, Emergency Drugs available, Suction available, Patient being monitored and Timeout performed Patient Re-evaluated:Patient Re-evaluated prior to inductionOxygen Delivery Method: Circle system utilized Preoxygenation: Pre-oxygenation with 100% oxygen Intubation Type: IV induction Ventilation: Mask ventilation without difficulty Laryngoscope Size: Miller and 2 Grade View: Grade I Tube type: Oral Tube size: 7.0 mm Number of attempts: 1 Placement Confirmation: ETT inserted through vocal cords under direct vision,  positive ETCO2 and breath sounds checked- equal and bilateral Secured at: 20 cm Tube secured with: Tape Dental Injury: Teeth and Oropharynx as per pre-operative assessment

## 2011-06-10 NOTE — Transfer of Care (Signed)
Immediate Anesthesia Transfer of Care Note  Patient: Pamela Larson  Procedure(s) Performed: Procedure(s) (LRB): LUMBAR LAMINECTOMY/DECOMPRESSION MICRODISCECTOMY 1 LEVEL (Left)  Patient Location: PACU  Anesthesia Type: General  Level of Consciousness: awake, alert  and oriented  Airway & Oxygen Therapy: Patient Spontanous Breathing and Patient connected to nasal cannula oxygen  Post-op Assessment: Report given to PACU RN, Post -op Vital signs reviewed and stable and Patient moving all extremities X 4  Post vital signs: Reviewed and stable  Complications: No apparent anesthesia complications

## 2011-06-10 NOTE — Progress Notes (Signed)
Filed Vitals:   06/10/11 1307 06/10/11 1309 06/10/11 1325 06/10/11 1540  BP: 105/53  127/66 138/74  Pulse:  75 80 76  Temp:   97.6 F (36.4 C) 97.6 F (36.4 C)  TempSrc:      Resp:  12 18 20   SpO2:  95% 99% 93%    Resting comfortably in bed. Excellent relief of radicular pain. Wound clean and dry. Voiding well. Eating well. Has ambulated in the halls.  Plan: Encouraged to continue to progress the postoperative recovery, encouraged to ambulate in halls.  Hewitt Shorts, MD 06/10/2011, 6:06 PM

## 2011-06-10 NOTE — Op Note (Signed)
06/10/2011  12:01 PM  PATIENT:  Pamela Larson  66 y.o. female  PRE-OPERATIVE DIAGNOSIS:  LUMBAR HERNIATED DISC, LUMBAR DEGENERATIVE DISC DISEASE, LUMBAR SPONDYLOSIS, LUMBAR RADICULOPATHY LEFT L5-S1  POST-OPERATIVE DIAGNOSIS:  LUMBAR HERNIATED DISC, LUMBAR DEGENERATIVE DISC DISEASE, LUMBAR SPONDYLOSIS, LUMBAR RADICULOPATHY LEFT L5-S1  PROCEDURE:  Procedure(s): LUMBAR LAMINECTOMY/DECOMPRESSION MICRODISCECTOMY 1 LEVEL: Left L5-S1 lumbar laminotomy and microdiscectomy, with microdissection, microsurgical technique, and the operating microscope  SURGEON:  Surgeon(s): Hewitt Shorts, MD  ANESTHESIA:   general  EBL:  Total I/O In: 1000 [I.V.:1000] Out: -   BLOOD ADMINISTERED:none  COUNT: Correct per nursing staff  DICTATION: Patient was brought to the operating room and placed under general endotracheal anesthesia. Patient was turned to prone position the lumbar region was prepped with Betadine soap and solution and draped in a sterile fashion. The midline was infiltrated with local anesthetic with epinephrine. A localizing x-ray was taken and the L5-S1 level was identified. Midline incision was made over the L5-S1 level and was carried down through the subcutaneous tissue to the lumbar fascia. The lumbar fascia was incised on the left side and the paraspinal muscles were dissected from the spinous processes and lamina in a subperiosteal fashion. Another x-ray was taken and the L5-S1 intralaminar space was identified. Laminotomy was performed using the high-speed drill and Kerrison punches. The ligamentum flavum was carefully resected. The underlying thecal sac and nerve root were identified. The disc herniation was identified and the thecal sac and nerve root gently retracted medially. A free fragment was found, it had migrated caudally behind the body of S1. It was removed in a piecemeal fashion, and all loose fact was the disc material were removed from the epidural space. We examined the  height of body of S1 and L2 the S1 nerve root foramen. We examined the annulus. A small rent in the annulus was seen, through which the disc herniation had occurred. However it is felt that good decompression of the neural structures had been achieved, and that there would be no chance which to entering the disc space and performing an intradiscal discectomy. Once the discectomy was completed and good decompression of the thecal sac and nerve had been achieved hemostasis was established with the use of bipolar cautery and Gelfoam with thrombin. The Gelfoam was removed and hemostasis confirmed. We then instilled 2 cc of fentanyl and 80 mg of Depo-Medrol into the epidural space. Deep fascia was closed with interrupted undyed 1 Vicryl sutures. Scarpa's fascia was closed with interrupted undyed 1 Vicryl sutures in the subcutaneous and subcuticular layer were closed with interrupted inverted 2-0 undyed Vicryl sutures. The skin edges were approximated with Dermabond. Following surgery the patient was turned back to a supine position to be reversed from the anesthetic extubated and transferred to the recovery room for further care.   PLAN OF CARE: Admit to inpatient   PATIENT DISPOSITION:  PACU - hemodynamically stable.   Delay start of Pharmacological VTE agent (>24hrs) due to surgical blood loss or risk of bleeding:  yes

## 2011-06-10 NOTE — Anesthesia Preprocedure Evaluation (Signed)
Anesthesia Evaluation  Patient identified by MRN, date of birth, ID band Patient awake    Reviewed: Allergy & Precautions, H&P , NPO status , Patient's Chart, lab work & pertinent test results  Airway Mallampati: I TM Distance: >3 FB Neck ROM: full    Dental  (+) Teeth Intact   Pulmonary          Cardiovascular Rhythm:regular Rate:Normal     Neuro/Psych PSYCHIATRIC DISORDERS  Neuromuscular disease    GI/Hepatic   Endo/Other    Renal/GU      Musculoskeletal   Abdominal   Peds  Hematology   Anesthesia Other Findings   Reproductive/Obstetrics                           Anesthesia Physical Anesthesia Plan  ASA: II  Anesthesia Plan: General   Post-op Pain Management:    Induction: Intravenous  Airway Management Planned: Oral ETT  Additional Equipment:   Intra-op Plan:   Post-operative Plan: Extubation in OR  Informed Consent: I have reviewed the patients History and Physical, chart, labs and discussed the procedure including the risks, benefits and alternatives for the proposed anesthesia with the patient or authorized representative who has indicated his/her understanding and acceptance.     Plan Discussed with: CRNA, Anesthesiologist and Surgeon  Anesthesia Plan Comments:         Anesthesia Quick Evaluation

## 2011-06-10 NOTE — H&P (Signed)
Subjective: Patient is a 66 y.o. female who is admitted for treatment of left lumbar radiculopathy secondary to a L5-S1 lumbar disc herniation. Symptoms began about very first of this year with pain radiating from the left buttock of the posterior thigh and calf to the lateral aspect of the left foot. She denies midline low back pain. She does have numbness and paresthesias in the left buttock, posterior thigh, calf, and at the lateral left foot. She has a sense of weakness in the left calf, and believes he may be some atrophy in the left calf. Patient was treated with prednisone Dosepak without any significant lasting improvement, as well as epidural steroid injection which did not give her any relief, she's been using hydrocodone and Aleve, but continues to have disabling pain. MRI scan reveals a broad based L5-S1 lumbar disc herniation, extending more to the left at the right, corresponding to a radiculopathy. Patient is admitted now for a left L5-S1 lumbar laminotomy and microdiscectomy.    Past Medical History  Diagnosis Date  . Pneumonia     hx of   . Hypothyroidism   . Anemia     pernicious anemia - 1991   . Chronic kidney disease     hx of interstitial cystitis   . 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 Arthritis     rheumatoid arthritis   . Depression   . Lyme disease     hx of possible   . Fibromyalgia     Past Surgical History  Procedure Date  . Appendectomy   . Bladder hydrodistention 1998    Prescriptions prior to admission  Medication Sig Dispense Refill  . acetaminophen (TYLENOL) 500 MG tablet Take 1,000 mg by mouth every 6 (six) hours as needed. For pain      . ALPRAZolam (XANAX) 0.25 MG tablet Take 0.25 mg by mouth 3 (three) times daily as needed. For anxiety      . calcium carbonate (OS-CAL - DOSED IN MG OF ELEMENTAL CALCIUM) 1250 MG tablet Take 1 tablet by mouth daily.      . ciprofloxacin (CIPRO) 250 MG tablet Take 250 mg  by mouth 2 (two) times daily.      Marland Kitchen FLUoxetine (PROZAC) 20 MG capsule Take 20 mg by mouth 2 (two) times daily.      Marland Kitchen HYDROcodone-acetaminophen (NORCO) 5-325 MG per tablet Take 2 tablets by mouth every 6 (six) hours as needed.      Marland Kitchen HYDROcodone-acetaminophen (VICODIN) 5-500 MG per tablet Take 2 tablets by mouth every 6 (six) hours as needed. For pain       . levothyroxine (SYNTHROID, LEVOTHROID) 125 MCG tablet Take 125 mcg by mouth daily with breakfast.      . naproxen sodium (ANAPROX) 220 MG tablet Take 220 mg by mouth 2 (two) times daily as needed. For pain      . omeprazole (PRILOSEC) 20 MG capsule Take 20 mg by mouth daily as needed. For indigestion      . Vitamin D, Ergocalciferol, (DRISDOL) 50000 UNITS CAPS Take 50,000 Units by mouth every 7 (seven) days. On Mondays       Allergies  Allergen Reactions  . Augmentin Nausea And Vomiting  . Demerol (Meperidine Hcl) Nausea And Vomiting  . Sulfa Antibiotics Nausea And Vomiting    History  Substance Use Topics  . Smoking status: Former Smoker    Quit date: 02/11/1990  . Smokeless tobacco: Never  Used  . Alcohol Use: 8.4 oz/week    14 Glasses of wine per week    History reviewed. No pertinent family history.   Review of Systems A comprehensive review of systems was negative.  Objective: Vital signs in last 24 hours: Temp:  [98.6 F (37 C)] 98.6 F (37 C) (04/29 0659) Pulse Rate:  [79] 79  (04/29 0659) Resp:  [18] 18  (04/29 0659) BP: (126)/(67) 126/67 mmHg (04/29 0659) SpO2:  [94 %] 94 % (04/29 0659)  EXAM:  Patient is a well-developed well-nourished thin white female in no acute distress. Lungs are clear to auscultation , the patient has symmetrical respiratory excursion. Heart has a regular rate and rhythm normal S1 and S2 no murmur.   Abdomen is soft nontender nondistended bowel sounds are present. Extremity examination shows no clubbing cyanosis or edema. Musculoskeletal examination shows no tenderness to palpation over the  lumbar spinous processes or paralumbar musculature. Mobility is significantly limited, flexion is limited to about 60 extension is limited at less than 5. Straight leg raising is positive the left at 60, and negative on the right. Motor exam shows 5 over 5 strength to direct testing to confrontation in the iliopsoas, quadriceps, dorsiflexor, centralis longus, and plantar flexors bilaterally. However she has difficulty with toe standing on the left and no difficulty with toe standing on the right. This suggests mild weakness of the left gastrocnemius. Further examination shows atrophy of the left calf musculature compared to the right calf musculature. Sensory examination shows intact sensation to pinprick in the legs and feet bilaterally. Reflexes are 2 in the quadriceps bilaterally, left gastrocnemius is minimal, and right gastrocnemius is 2. Toes are downgoing bilaterally. She has a normal gait and stance.  Data Review:CBC    Component Value Date/Time   WBC 5.2 06/06/2011 1130   RBC 4.15 06/06/2011 1130   HGB 13.9 06/06/2011 1130   HCT 40.9 06/06/2011 1130   PLT 327 06/06/2011 1130   MCV 98.6 06/06/2011 1130   MCH 33.5 06/06/2011 1130   MCHC 34.0 06/06/2011 1130   RDW 13.6 06/06/2011 1130   LYMPHSABS 1.9 06/06/2011 1130   MONOABS 0.6 06/06/2011 1130   EOSABS 0.1 06/06/2011 1130   BASOSABS 0.0 06/06/2011 1130                          BMET    Component Value Date/Time   NA 137 06/06/2011 1130   K 3.8 06/06/2011 1130   CL 99 06/06/2011 1130   CO2 26 06/06/2011 1130   GLUCOSE 91 06/06/2011 1130   BUN 15 06/06/2011 1130   CREATININE 0.60 06/06/2011 1130   CALCIUM 9.7 06/06/2011 1130   GFRNONAA >90 06/06/2011 1130   GFRAA >90 06/06/2011 1130     Assessment/Plan:  Patient with a left lumbar radiculopathy secondary to a broad-based L5-S1 disc herniation extending more to the left at the right. She is admitted now for a left L5-S1 lumbar laminotomy and microdiscectomy. I've discussed with the patient the  nature of his condition, the nature the surgical procedure, the typical length of surgery, hospital stay, and overall recuperation. We discussed limitations postoperatively. I discussed risks of surgery including risks of infection, bleeding, possibly need for transfusion, the risk of nerve root dysfunction with pain, weakness, numbness, or paresthesias, or risk of dural tear and CSF leakage and possible need for further surgery, the risk of recurrent disc herniation and the possible need for further surgery, and the  risk of anesthetic complications including myocardial infarction, stroke, pneumonia, and death. Understanding all this the patient does wish to proceed with surgery and is admitted for such.    Hewitt Shorts, MD 06/10/2011 7:53 AM

## 2011-06-10 NOTE — Preoperative (Signed)
Beta Blockers   Reason not to administer Beta Blockers:Not Applicable 

## 2011-06-10 NOTE — Anesthesia Postprocedure Evaluation (Signed)
  Anesthesia Post-op Note  Patient: Pamela Larson  Procedure(s) Performed: Procedure(s) (LRB): LUMBAR LAMINECTOMY/DECOMPRESSION MICRODISCECTOMY 1 LEVEL (Left)  Patient Location: PACU  Anesthesia Type: General  Level of Consciousness: awake, alert , oriented and patient cooperative  Airway and Oxygen Therapy: Patient Spontanous Breathing and Patient connected to nasal cannula oxygen  Post-op Pain: mild  Post-op Assessment: Post-op Vital signs reviewed, Patient's Cardiovascular Status Stable, Respiratory Function Stable, Patent Airway, No signs of Nausea or vomiting and Pain level controlled  Post-op Vital Signs: stable  Complications: No apparent anesthesia complications

## 2011-06-11 ENCOUNTER — Encounter (HOSPITAL_COMMUNITY): Payer: Self-pay | Admitting: Neurosurgery

## 2011-06-11 MED ORDER — HYDROCODONE-ACETAMINOPHEN 5-325 MG PO TABS: 0.5000 | ORAL_TABLET | ORAL | Status: AC | PRN

## 2011-06-11 NOTE — Discharge Instructions (Signed)
Wound Care Leave incision open to air. You may shower. Do not scrub directly on incision.  Do not put any creams, lotions, or ointments on incision. Activity Walk each and every day, increasing distance each day. No lifting greater than 5 lbs.  Avoid bending, arching, and twisting. No driving for 2 weeks; may ride as a passenger locally. No bending,no lifting, and No twisting. Diet Resume your normal diet.  Return to Work Will be discussed at you follow up appointment. Call Your Doctor If Any of These Occur Redness, drainage, or swelling at the wound.  Temperature greater than 101 degrees. Severe pain not relieved by pain medication. Incision starts to come apart. Follow Up Appt Call today for appointment in 3 weeks [phone]) or for problems.  If you have any hardware placed in your spine, you will need an x-ray before your appointment.

## 2011-06-11 NOTE — Discharge Summary (Signed)
Physician Discharge Summary  Patient ID: Pamela Larson MRN: [id number] DOB/AGE: 1945-07-08 66 y.o.  Admit date: 06/10/2011 Discharge date: 06/11/2011  Admission Diagnoses: Lumbar HNP  Discharge Diagnoses: Lumbar HNP  Discharged Condition: good  Hospital Course: Patient was admitted underwent a left L5-S1 lumbar laminotomy and microdiscectomy. She has had excellent relief of her radicular pain. She's been up and ambulating. Her wound is healing nicely. She is being discharged home with instructions regarding wound care and activities following discharge. She is to return for followup with me in 3 weeks.  Discharge Exam: Blood pressure 135/58, pulse 73, temperature 98.5 F (36.9 C), temperature source Oral, resp. rate 18, SpO2 96.00%.  Disposition: Home   Medication List  As of 06/11/2011  8:19 AM   TAKE these medications         acetaminophen 500 MG tablet   Commonly known as: TYLENOL   Take 1,000 mg by mouth every 6 (six) hours as needed. For pain      ALPRAZolam 0.25 MG tablet   Commonly known as: XANAX   Take 0.25 mg by mouth 3 (three) times daily as needed. For anxiety      calcium carbonate 1250 MG tablet   Commonly known as: OS-CAL - dosed in mg of elemental calcium   Take 1 tablet by mouth daily.      ciprofloxacin 250 MG tablet   Commonly known as: CIPRO   Take 250 mg by mouth 2 (two) times daily.      FLUoxetine 20 MG capsule   Commonly known as: PROZAC   Take 20 mg by mouth 2 (two) times daily.      HYDROcodone-acetaminophen 5-500 MG per tablet   Commonly known as: VICODIN   Take 2 tablets by mouth every 6 (six) hours as needed. For pain        HYDROcodone-acetaminophen 5-325 MG per tablet   Commonly known as: NORCO   Take 2 tablets by mouth every 6 (six) hours as needed.      HYDROcodone-acetaminophen 5-325 MG per tablet   Commonly known as: NORCO   Take 0.5-1 tablets by mouth every 4 (four) hours as needed for pain.      levothyroxine 125 MCG  tablet   Commonly known as: SYNTHROID, LEVOTHROID   Take 125 mcg by mouth daily with breakfast.      naproxen sodium 220 MG tablet   Commonly known as: ANAPROX   Take 220 mg by mouth 2 (two) times daily as needed. For pain      omeprazole 20 MG capsule   Commonly known as: PRILOSEC   Take 20 mg by mouth daily as needed. For indigestion      Vitamin D (Ergocalciferol) 50000 UNITS Caps   Commonly known as: DRISDOL   Take 50,000 Units by mouth every 7 (seven) days. On Mondays             Signed: Hewitt Shorts, MD 06/11/2011, 8:19 AM

## 2011-06-12 ENCOUNTER — Encounter (HOSPITAL_COMMUNITY): Admission: RE | Payer: Self-pay | Source: Ambulatory Visit

## 2011-06-12 ENCOUNTER — Ambulatory Visit (HOSPITAL_COMMUNITY): Admission: RE | Admit: 2011-06-12 | Payer: Medicare Other | Source: Ambulatory Visit | Admitting: Specialist

## 2011-06-12 SURGERY — LUMBAR LAMINECTOMY/DECOMPRESSION MICRODISCECTOMY
Anesthesia: General | Laterality: Bilateral

## 2011-06-18 DIAGNOSIS — M810 Age-related osteoporosis without current pathological fracture: Secondary | ICD-10-CM | POA: Diagnosis not present

## 2011-06-18 DIAGNOSIS — E039 Hypothyroidism, unspecified: Secondary | ICD-10-CM | POA: Diagnosis not present

## 2011-06-18 DIAGNOSIS — E559 Vitamin D deficiency, unspecified: Secondary | ICD-10-CM | POA: Diagnosis not present

## 2011-06-18 DIAGNOSIS — M069 Rheumatoid arthritis, unspecified: Secondary | ICD-10-CM | POA: Diagnosis not present

## 2011-06-18 DIAGNOSIS — IMO0001 Reserved for inherently not codable concepts without codable children: Secondary | ICD-10-CM | POA: Diagnosis not present

## 2011-06-18 DIAGNOSIS — D510 Vitamin B12 deficiency anemia due to intrinsic factor deficiency: Secondary | ICD-10-CM | POA: Diagnosis not present

## 2011-06-19 ENCOUNTER — Encounter: Payer: Self-pay | Admitting: Physical Therapy

## 2011-06-25 ENCOUNTER — Encounter: Payer: Self-pay | Admitting: Gastroenterology

## 2011-06-25 DIAGNOSIS — Z0000 Encounter for general adult medical examination without abnormal findings: Secondary | ICD-10-CM | POA: Diagnosis not present

## 2011-06-25 DIAGNOSIS — E039 Hypothyroidism, unspecified: Secondary | ICD-10-CM | POA: Diagnosis not present

## 2011-06-25 DIAGNOSIS — F329 Major depressive disorder, single episode, unspecified: Secondary | ICD-10-CM | POA: Diagnosis not present

## 2011-06-25 DIAGNOSIS — R5382 Chronic fatigue, unspecified: Secondary | ICD-10-CM | POA: Diagnosis not present

## 2011-07-17 DIAGNOSIS — M810 Age-related osteoporosis without current pathological fracture: Secondary | ICD-10-CM | POA: Diagnosis not present

## 2011-08-20 ENCOUNTER — Encounter: Payer: Medicare Other | Admitting: Internal Medicine

## 2011-09-17 DIAGNOSIS — M5126 Other intervertebral disc displacement, lumbar region: Secondary | ICD-10-CM | POA: Diagnosis not present

## 2011-09-27 DIAGNOSIS — F4323 Adjustment disorder with mixed anxiety and depressed mood: Secondary | ICD-10-CM | POA: Diagnosis not present

## 2011-10-02 DIAGNOSIS — F4323 Adjustment disorder with mixed anxiety and depressed mood: Secondary | ICD-10-CM | POA: Diagnosis not present

## 2011-10-25 DIAGNOSIS — F4323 Adjustment disorder with mixed anxiety and depressed mood: Secondary | ICD-10-CM | POA: Diagnosis not present

## 2011-11-01 DIAGNOSIS — H2510 Age-related nuclear cataract, unspecified eye: Secondary | ICD-10-CM | POA: Diagnosis not present

## 2011-11-01 DIAGNOSIS — H5210 Myopia, unspecified eye: Secondary | ICD-10-CM | POA: Diagnosis not present

## 2011-11-06 DIAGNOSIS — F4323 Adjustment disorder with mixed anxiety and depressed mood: Secondary | ICD-10-CM | POA: Diagnosis not present

## 2011-11-13 DIAGNOSIS — F4323 Adjustment disorder with mixed anxiety and depressed mood: Secondary | ICD-10-CM | POA: Diagnosis not present

## 2011-11-20 DIAGNOSIS — M810 Age-related osteoporosis without current pathological fracture: Secondary | ICD-10-CM | POA: Diagnosis not present

## 2011-11-20 DIAGNOSIS — Z79899 Other long term (current) drug therapy: Secondary | ICD-10-CM | POA: Diagnosis not present

## 2011-11-20 DIAGNOSIS — E559 Vitamin D deficiency, unspecified: Secondary | ICD-10-CM | POA: Diagnosis not present

## 2011-11-20 DIAGNOSIS — Z23 Encounter for immunization: Secondary | ICD-10-CM | POA: Diagnosis not present

## 2011-11-26 DIAGNOSIS — F4323 Adjustment disorder with mixed anxiety and depressed mood: Secondary | ICD-10-CM | POA: Diagnosis not present

## 2011-12-06 ENCOUNTER — Ambulatory Visit (HOSPITAL_COMMUNITY): Payer: Medicare Other

## 2011-12-06 DIAGNOSIS — F4323 Adjustment disorder with mixed anxiety and depressed mood: Secondary | ICD-10-CM | POA: Diagnosis not present

## 2011-12-10 ENCOUNTER — Other Ambulatory Visit (HOSPITAL_COMMUNITY): Payer: Self-pay | Admitting: *Deleted

## 2011-12-11 ENCOUNTER — Encounter (HOSPITAL_COMMUNITY): Payer: Self-pay

## 2011-12-11 ENCOUNTER — Encounter (HOSPITAL_COMMUNITY)
Admission: RE | Admit: 2011-12-11 | Discharge: 2011-12-11 | Disposition: A | Discharge status: Home / Self Care | Payer: Medicare Other | Source: Ambulatory Visit | Attending: Internal Medicine | Admitting: Internal Medicine

## 2011-12-11 DIAGNOSIS — F4323 Adjustment disorder with mixed anxiety and depressed mood: Secondary | ICD-10-CM | POA: Diagnosis not present

## 2011-12-11 DIAGNOSIS — M810 Age-related osteoporosis without current pathological fracture: Secondary | ICD-10-CM | POA: Insufficient documentation

## 2011-12-11 HISTORY — DX: Age-related osteoporosis without current pathological fracture: M81.0

## 2011-12-11 MED ORDER — SODIUM CHLORIDE 0.9 % IV SOLN
Freq: Once | INTRAVENOUS | Status: AC
  Administered 2011-12-11: 14:00:00 via INTRAVENOUS

## 2011-12-11 MED ORDER — ZOLEDRONIC ACID 5 MG/100ML IV SOLN
5.0000 mg | Freq: Once | INTRAVENOUS | Status: AC
  Administered 2011-12-11: 5 mg via INTRAVENOUS
  Filled 2011-12-11: qty 100

## 2011-12-19 DIAGNOSIS — F4323 Adjustment disorder with mixed anxiety and depressed mood: Secondary | ICD-10-CM | POA: Diagnosis not present

## 2011-12-25 DIAGNOSIS — F4323 Adjustment disorder with mixed anxiety and depressed mood: Secondary | ICD-10-CM | POA: Diagnosis not present

## 2011-12-26 DIAGNOSIS — J0100 Acute maxillary sinusitis, unspecified: Secondary | ICD-10-CM | POA: Diagnosis not present

## 2011-12-26 DIAGNOSIS — R05 Cough: Secondary | ICD-10-CM | POA: Diagnosis not present

## 2012-01-01 DIAGNOSIS — F4323 Adjustment disorder with mixed anxiety and depressed mood: Secondary | ICD-10-CM | POA: Diagnosis not present

## 2012-01-07 DIAGNOSIS — F4323 Adjustment disorder with mixed anxiety and depressed mood: Secondary | ICD-10-CM | POA: Diagnosis not present

## 2012-01-15 DIAGNOSIS — F4323 Adjustment disorder with mixed anxiety and depressed mood: Secondary | ICD-10-CM | POA: Diagnosis not present

## 2012-01-21 DIAGNOSIS — F4323 Adjustment disorder with mixed anxiety and depressed mood: Secondary | ICD-10-CM | POA: Diagnosis not present

## 2012-01-28 DIAGNOSIS — F4323 Adjustment disorder with mixed anxiety and depressed mood: Secondary | ICD-10-CM | POA: Diagnosis not present

## 2012-02-11 DIAGNOSIS — F4323 Adjustment disorder with mixed anxiety and depressed mood: Secondary | ICD-10-CM | POA: Diagnosis not present

## 2012-02-11 DIAGNOSIS — R05 Cough: Secondary | ICD-10-CM | POA: Diagnosis not present

## 2012-02-11 DIAGNOSIS — Z1231 Encounter for screening mammogram for malignant neoplasm of breast: Secondary | ICD-10-CM | POA: Diagnosis not present

## 2012-02-11 DIAGNOSIS — J0100 Acute maxillary sinusitis, unspecified: Secondary | ICD-10-CM | POA: Diagnosis not present

## 2012-02-11 DIAGNOSIS — J209 Acute bronchitis, unspecified: Secondary | ICD-10-CM | POA: Diagnosis not present

## 2012-02-11 DIAGNOSIS — N6489 Other specified disorders of breast: Secondary | ICD-10-CM | POA: Diagnosis not present

## 2012-02-12 ENCOUNTER — Ambulatory Visit (INDEPENDENT_AMBULATORY_CARE_PROVIDER_SITE_OTHER): Payer: Medicare Other | Admitting: Family Medicine

## 2012-02-12 VITALS — BP 145/86 | HR 78 | Temp 98.9°F | Resp 16 | Ht 62.0 in | Wt 108.2 lb

## 2012-02-12 DIAGNOSIS — D539 Nutritional anemia, unspecified: Secondary | ICD-10-CM

## 2012-02-12 DIAGNOSIS — R110 Nausea: Secondary | ICD-10-CM | POA: Diagnosis not present

## 2012-02-12 DIAGNOSIS — S0990XA Unspecified injury of head, initial encounter: Secondary | ICD-10-CM | POA: Diagnosis not present

## 2012-02-12 DIAGNOSIS — J111 Influenza due to unidentified influenza virus with other respiratory manifestations: Secondary | ICD-10-CM

## 2012-02-12 DIAGNOSIS — R52 Pain, unspecified: Secondary | ICD-10-CM

## 2012-02-12 LAB — POCT CBC
Granulocyte percent: 71.7 %G (ref 37–80)
Hemoglobin: 11.8 g/dL — AB (ref 12.2–16.2)
MCH, POC: 32.8 pg — AB (ref 27–31.2)
MCV: 105.8 fL — AB (ref 80–97)
MID (cbc): 0.4 (ref 0–0.9)
Platelet Count, POC: 194 10*3/uL (ref 142–424)
RBC: 3.6 M/uL — AB (ref 4.04–5.48)
WBC: 4.2 10*3/uL — AB (ref 4.6–10.2)

## 2012-02-12 LAB — POCT INFLUENZA A/B
Influenza A, POC: POSITIVE
Influenza B, POC: NEGATIVE

## 2012-02-12 LAB — FOLATE: Folate: 10.9 ng/mL

## 2012-02-12 MED ORDER — OSELTAMIVIR PHOSPHATE 75 MG PO CAPS: 75.0000 mg | ORAL_CAPSULE | Freq: Two times a day (BID) | ORAL | Status: DC

## 2012-02-12 NOTE — Patient Instructions (Addendum)
Add the tamiflu to your regimen.  If you are not getting better in the next couple of days please let me know.  Please have Fraser Din send me the weights of both boys and everyone's date of birth so I can send tamiflu in for the rest of the family.  We hope that you feel better soon!

## 2012-02-12 NOTE — Progress Notes (Signed)
[facility] [address], [place] [address] [id number]  Date:  02/12/2012   Name:  Pamela Larson   DOB:  06/28/1945   MRN:  [id number]  PCP:  No primary provider on file.    Chief Complaint: Cough, Headache and Generalized Body Aches   History of Present Illness:  Pamela Larson is a 67 y.o. very pleasant female patient who presents with the following:  Here today with illness.  She recently went to [place] with her daughter and family.  She tripped and fell last Friday (today is Wednesday)- she had some resulting body aches and hit her head.  She fell forward and broke her fall with her hands.  Her head did hit the ground but she did not have a severe blow to the head or any LOC.The next day she noted some more aches and HA, thought this was due to her fall.  They drove home from [place] on saturday and she threw up in the car.   Sunday she work up "felling like I had been hit by a truck" with HA and body aches.  She felt awful on Monday.  She felt exhausted, and started to cough Monday night.  She was seen at her PCPs office on Monday and was diagnosed with bronchitis.  She was started on levaquin and a steroid nasal spray.  However, her cough and other symptoms have not improved yet.    Last night she had a hard time sleeping with body aches and HA.    She is taking aleve every 12 hours and tylenol for the last several days. No aleve since 1230 this morning, no tylenol this am either.   She does have a ST and earache.    She has vomited a couple of times- this last occurred on Monday- she last felt nauseated yesterday.  Today the nausea is ok and her HA is lessened  There is no problem list on file for this patient.   Past Medical History  Diagnosis Date  . Pneumonia     hx of   . Hypothyroidism   . Anemia     pernicious anemia - 1991   . Chronic kidney disease     hx of interstitial cystitis   . Neuromuscular disorder     hx of neuropathy  in 1991 due to pernicious anemia   . Arthritis     rheumatoid arthritis   . Depression   . Lyme disease     hx of possible   . Fibromyalgia   . Osteoporosis     Past Surgical History  Procedure Date  . Appendectomy   . Bladder hydrodistention 1998  . Lumbar laminectomy/decompression microdiscectomy 06/10/2011    Procedure: LUMBAR LAMINECTOMY/DECOMPRESSION MICRODISCECTOMY 1 LEVEL;  Surgeon: Hewitt Shorts, MD;  Location: [facility];  Service: Neurosurgery;  Laterality: Left;  Lumbar Five Sacral One Lumbar Laminectomy Decompression Microdiscectomy   . Breast surgery   . Spine surgery     History  Substance Use Topics  . Smoking status: Former Smoker    Quit date: 02/11/1990  . Smokeless tobacco: Never Used  . Alcohol Use: 8.4 oz/week    14 Glasses of wine per week    Family History  Problem Relation Age of Onset  . Cancer Mother   . Cancer Maternal Grandmother     Allergies  Allergen Reactions  . Amoxicillin-Pot Clavulanate Nausea And Vomiting  . Demerol (Meperidine Hcl) Nausea And  Vomiting  . Sulfa Antibiotics Nausea And Vomiting    Medication list has been reviewed and updated.  Current Outpatient Prescriptions on File Prior to Visit  Medication Sig Dispense Refill  . acetaminophen (TYLENOL) 500 MG tablet Take 1,000 mg by mouth every 6 (six) hours as needed. For pain      . ALPRAZolam (XANAX) 0.25 MG tablet Take 0.25 mg by mouth 3 (three) times daily as needed. For anxiety      . calcium carbonate (OS-CAL - DOSED IN MG OF ELEMENTAL CALCIUM) 1250 MG tablet Take 1 tablet by mouth daily.      Marland Kitchen FLUoxetine (PROZAC) 20 MG capsule Take 20 mg by mouth 2 (two) times daily.      Marland Kitchen levothyroxine (SYNTHROID, LEVOTHROID) 125 MCG tablet Take 125 mcg by mouth daily with breakfast.      . omeprazole (PRILOSEC) 20 MG capsule Take 20 mg by mouth daily as needed. For indigestion      . Vitamin D, Ergocalciferol, (DRISDOL) 50000 UNITS CAPS Take 50,000 Units by mouth every 7  (seven) days. On Mondays      . ciprofloxacin (CIPRO) 250 MG tablet Take 250 mg by mouth 2 (two) times daily.      Marland Kitchen HYDROcodone-acetaminophen (NORCO) 5-325 MG per tablet Take 2 tablets by mouth every 6 (six) hours as needed.      Marland Kitchen HYDROcodone-acetaminophen (VICODIN) 5-500 MG per tablet Take 2 tablets by mouth every 6 (six) hours as needed. For pain       . naproxen sodium (ANAPROX) 220 MG tablet Take 220 mg by mouth 2 (two) times daily as needed. For pain        Review of Systems:  As per HPI- otherwise negative.   Physical Examination: Filed Vitals:   02/12/12 1100  BP: 145/86  Pulse: 78  Temp: 98.9 F (37.2 C)  Resp: 16   Filed Vitals:   02/12/12 1100  Height: 5\' 2"  (1.575 m)  Weight: 108 lb 3.2 oz (49.079 kg)   Body mass index is 19.79 kg/(m^2). Ideal Body Weight: Weight in (lb) to have BMI = 25: 136.4   GEN: WDWN, NAD, Non-toxic, A & O x 3, slim build, appears to have the flu HEENT: Atraumatic, Normocephalic. Neck supple. No masses, No LAD. Bilateral TM wnl, oropharynx normal.  PEERL,EOMI.   Ears and Nose: No external deformity. CV: RRR, No M/G/R. No JVD. No thrill. No extra heart sounds. PULM: CTA B, no wheezes, crackles, rhonchi. No retractions. No resp. distress. No accessory muscle use. ABD: S, NT, ND, +BS. No rebound. No HSM. EXTR: No c/c/e.  Hands, wrists, forearms ok, no evidence of fracture such as tenderness or swelling/ bruising NEURO Normal gait.  PSYCH: Normally interactive. Conversant. Not depressed or anxious appearing.  Calm demeanor.   Results for orders placed in visit on 02/12/12  POCT INFLUENZA A/B      Component Value Range   Influenza A, POC Positive     Influenza B, POC Negative    POCT CBC      Component Value Range   WBC 4.2 (*) 4.6 - 10.2 K/uL   Lymph, poc 0.8  0.6 - 3.4   POC LYMPH PERCENT 19.2  10 - 50 %L   MID (cbc) 0.4  0 - 0.9   POC MID % 9.1  0 - 12 %M   POC Granulocyte 3.0  2 - 6.9   Granulocyte percent 71.7  37 - 80 %G   RBC  3.60 (*)  4.04 - 5.48 M/uL   Hemoglobin 11.8 (*) 12.2 - 16.2 g/dL   HCT, POC 14.7  82.9 - 47.9 %   MCV 105.8 (*) 80 - 97 fL   MCH, POC 32.8 (*) 27 - 31.2 pg   MCHC 31.0 (*) 31.8 - 35.4 g/dL   RDW, POC 56.2     Platelet Count, POC 194  142 - 424 K/uL   MPV 7.7  0 - 99.8 fL   Per pt she has a known history of pernicious anemia which has been monitored over time.   Assessment and Plan: 1. Body aches  POCT Influenza A/B, POCT CBC  2. Nausea  POCT Influenza A/B, POCT CBC  3. Head injury  CT Head Wo Contrast  4. Influenza  oseltamivir (TAMIFLU) 75 MG capsule  5. Macrocytic anemia  Vitamin B12, Folate   Meds ordered this encounter  Medications  . oseltamivir (TAMIFLU) 75 MG capsule    Sig: Take 1 capsule (75 mg total) by mouth 2 (two) times daily.    Dispense:  10 capsule    Refill:  0    Will start treatment for influenza with tamiflu.  Ok to continue levaquin.  Discussed doing a head CT in detail as she did hit her head and then had vomiting a couple of days later. Benefits include discovery of any serious pathology such as a bleed, drawbacks include radiation.   At this time we have another explanation for her symptoms and Tamela Oddi does not wish to do the CT. If she develops worsening HA or her vomiting returns she will seek help right away.  Mild macrocytic anemia- will check B12 and folate  Betsy's daughter Fraser Din is a friend and neighbor. The whole family was together on their trip to [place] when [place] got sick. She would like to treat the family prophyactically with Tamiflu. Asked her to weigh her two sons Reita Cliche and Sherilyn Cooter for appropriate dosing.  None of the family has any medication allergies or significant medical histories/ medication interactions, and all are currently well.  Called in Tamiflu at prophylactic dosage for Raynelle Fanning, and the children Reita Cliche and Sherilyn Cooter to the [facility] on [address]  Penni Homans 07/03/76 Ursula Beath 11/25/75 Bobby Young 63 lbs, 11/28/04 Myrtie Soman  35 lbs, 10/15/06    Abbe Amsterdam, MD

## 2012-02-13 ENCOUNTER — Emergency Department (HOSPITAL_COMMUNITY): Payer: Medicare Other

## 2012-02-13 ENCOUNTER — Encounter (HOSPITAL_COMMUNITY): Payer: Self-pay

## 2012-02-13 ENCOUNTER — Emergency Department (HOSPITAL_COMMUNITY)
Admission: EM | Admit: 2012-02-13 | Discharge: 2012-02-13 | Disposition: A | Discharge status: Home / Self Care | Payer: Medicare Other | Attending: Emergency Medicine | Admitting: Emergency Medicine

## 2012-02-13 DIAGNOSIS — Z8739 Personal history of other diseases of the musculoskeletal system and connective tissue: Secondary | ICD-10-CM | POA: Diagnosis not present

## 2012-02-13 DIAGNOSIS — R51 Headache: Secondary | ICD-10-CM | POA: Diagnosis not present

## 2012-02-13 DIAGNOSIS — R059 Cough, unspecified: Secondary | ICD-10-CM | POA: Insufficient documentation

## 2012-02-13 DIAGNOSIS — Z862 Personal history of diseases of the blood and blood-forming organs and certain disorders involving the immune mechanism: Secondary | ICD-10-CM | POA: Diagnosis not present

## 2012-02-13 DIAGNOSIS — J111 Influenza due to unidentified influenza virus with other respiratory manifestations: Secondary | ICD-10-CM | POA: Insufficient documentation

## 2012-02-13 DIAGNOSIS — R509 Fever, unspecified: Secondary | ICD-10-CM | POA: Insufficient documentation

## 2012-02-13 DIAGNOSIS — M069 Rheumatoid arthritis, unspecified: Secondary | ICD-10-CM | POA: Insufficient documentation

## 2012-02-13 DIAGNOSIS — Z8701 Personal history of pneumonia (recurrent): Secondary | ICD-10-CM | POA: Diagnosis not present

## 2012-02-13 DIAGNOSIS — Z79899 Other long term (current) drug therapy: Secondary | ICD-10-CM | POA: Insufficient documentation

## 2012-02-13 DIAGNOSIS — M810 Age-related osteoporosis without current pathological fracture: Secondary | ICD-10-CM | POA: Diagnosis not present

## 2012-02-13 DIAGNOSIS — E039 Hypothyroidism, unspecified: Secondary | ICD-10-CM | POA: Diagnosis not present

## 2012-02-13 DIAGNOSIS — N189 Chronic kidney disease, unspecified: Secondary | ICD-10-CM | POA: Insufficient documentation

## 2012-02-13 DIAGNOSIS — Z8669 Personal history of other diseases of the nervous system and sense organs: Secondary | ICD-10-CM | POA: Insufficient documentation

## 2012-02-13 DIAGNOSIS — F329 Major depressive disorder, single episode, unspecified: Secondary | ICD-10-CM | POA: Diagnosis not present

## 2012-02-13 DIAGNOSIS — R112 Nausea with vomiting, unspecified: Secondary | ICD-10-CM | POA: Diagnosis not present

## 2012-02-13 DIAGNOSIS — Z1231 Encounter for screening mammogram for malignant neoplasm of breast: Secondary | ICD-10-CM | POA: Diagnosis not present

## 2012-02-13 DIAGNOSIS — IMO0002 Reserved for concepts with insufficient information to code with codable children: Secondary | ICD-10-CM | POA: Insufficient documentation

## 2012-02-13 DIAGNOSIS — R05 Cough: Secondary | ICD-10-CM | POA: Insufficient documentation

## 2012-02-13 DIAGNOSIS — Y939 Activity, unspecified: Secondary | ICD-10-CM | POA: Insufficient documentation

## 2012-02-13 DIAGNOSIS — S0990XA Unspecified injury of head, initial encounter: Secondary | ICD-10-CM | POA: Diagnosis not present

## 2012-02-13 DIAGNOSIS — Z872 Personal history of diseases of the skin and subcutaneous tissue: Secondary | ICD-10-CM | POA: Insufficient documentation

## 2012-02-13 DIAGNOSIS — N6489 Other specified disorders of breast: Secondary | ICD-10-CM | POA: Diagnosis not present

## 2012-02-13 DIAGNOSIS — Y929 Unspecified place or not applicable: Secondary | ICD-10-CM | POA: Insufficient documentation

## 2012-02-13 DIAGNOSIS — Z87891 Personal history of nicotine dependence: Secondary | ICD-10-CM | POA: Insufficient documentation

## 2012-02-13 DIAGNOSIS — F3289 Other specified depressive episodes: Secondary | ICD-10-CM | POA: Insufficient documentation

## 2012-02-13 LAB — CBC WITH DIFFERENTIAL/PLATELET
Basophils Absolute: 0 10*3/uL (ref 0.0–0.1)
HCT: 35.1 % — ABNORMAL LOW (ref 36.0–46.0)
Lymphocytes Relative: 14 % (ref 12–46)
Lymphs Abs: 0.5 10*3/uL — ABNORMAL LOW (ref 0.7–4.0)
Monocytes Absolute: 0.3 10*3/uL (ref 0.1–1.0)
Neutro Abs: 2.8 10*3/uL (ref 1.7–7.7)
Platelets: 170 10*3/uL (ref 150–400)
RBC: 3.5 MIL/uL — ABNORMAL LOW (ref 3.87–5.11)
RDW: 13.4 % (ref 11.5–15.5)
WBC: 3.6 10*3/uL — ABNORMAL LOW (ref 4.0–10.5)

## 2012-02-13 LAB — COMPREHENSIVE METABOLIC PANEL
ALT: 13 U/L (ref 0–35)
AST: 23 U/L (ref 0–37)
CO2: 26 mEq/L (ref 19–32)
Chloride: 100 mEq/L (ref 96–112)
GFR calc non Af Amer: >90 mL/min (ref >90)
Glucose, Bld: 104 mg/dL — ABNORMAL HIGH (ref 70–99)
Sodium: 137 mEq/L (ref 135–145)
Total Bilirubin: 0.2 mg/dL — ABNORMAL LOW (ref 0.3–1.2)

## 2012-02-13 LAB — URINALYSIS, MICROSCOPIC ONLY
Bilirubin Urine: NEGATIVE
Hgb urine dipstick: NEGATIVE
Ketones, ur: 15 mg/dL — AB
Protein, ur: NEGATIVE mg/dL
Urobilinogen, UA: 0.2 mg/dL (ref 0.0–1.0)

## 2012-02-13 MED ORDER — SODIUM CHLORIDE 0.9 % IV BOLUS (SEPSIS)
500.0000 mL | Freq: Once | INTRAVENOUS | Status: AC
  Administered 2012-02-13: 500 mL via INTRAVENOUS

## 2012-02-13 MED ORDER — ONDANSETRON HCL 4 MG PO TABS: 4.0000 mg | ORAL_TABLET | Freq: Four times a day (QID) | ORAL | Status: DC

## 2012-02-13 NOTE — ED Provider Notes (Signed)
History     CSN: [id number]  Arrival date & time 02/13/12  1659   First MD Initiated Contact with Patient 02/13/12 1857      Chief Complaint  Patient presents with  . flu sx   . Head Injury  . Emesis    (Consider location/radiation/quality/duration/timing/severity/associated sxs/prior treatment) HPI Comments: Patient comes to the ER for evaluation multiple problems. Patient reports that she felt more than a week ago and hit her head. There was no loss of consciousness. She had a headache the next day and it got worse for the next couple of days. Then she started having fever, chills or flu symptoms. She saw her doctor a week ago was put on Levaquin for bronchitis. Symptoms worsened yesterday and she was diagnosed with the flu, started on Tamiflu. Today she started having nausea and vomiting. There is no diarrhea. Patient has not had chest pain. She does have a cough productive of yellow sputum.  Patient is a 67 y.o. female presenting with head injury and vomiting.  Head Injury  Associated symptoms include vomiting.  Emesis  Associated symptoms include chills, cough, a fever and headaches. Pertinent negatives include no diarrhea.    Past Medical History  Diagnosis Date  . Pneumonia     hx of   . Hypothyroidism   . Anemia     pernicious anemia - 1991   . Chronic kidney disease     hx of interstitial cystitis   . 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 Arthritis     rheumatoid arthritis   . Depression   . Lyme disease     hx of possible   . Fibromyalgia   . Osteoporosis     Past Surgical History  Procedure Date  . Appendectomy   . Bladder hydrodistention 1998  . Lumbar laminectomy/decompression microdiscectomy 06/10/2011    Procedure: LUMBAR LAMINECTOMY/DECOMPRESSION MICRODISCECTOMY 1 LEVEL;  Surgeon: Hewitt Shorts, MD;  Location: [facility];  Service: Neurosurgery;  Laterality: Left;  Lumbar Five Sacral One Lumbar  Laminectomy Decompression Microdiscectomy   . Breast surgery   . Spine surgery     Family History  Problem Relation Age of Onset  . Cancer Mother   . Cancer Maternal Grandmother     History  Substance Use Topics  . Smoking status: Former Smoker    Quit date: 02/11/1990  . Smokeless tobacco: Never Used  . Alcohol Use: 8.4 oz/week    14 Glasses of wine per week    OB History    Grav Para Term Preterm Abortions TAB SAB Ect Mult Living                  Review of Systems  Constitutional: Positive for fever, chills and fatigue.  Respiratory: Positive for cough.   Gastrointestinal: Positive for nausea and vomiting. Negative for diarrhea.  Neurological: Positive for headaches.  All other systems reviewed and are negative.    Allergies  Amoxicillin-pot clavulanate; Demerol; and Sulfa antibiotics  Home Medications   Current Outpatient Rx  Name  Route  Sig  Dispense  Refill  . ACETAMINOPHEN 500 MG PO TABS   Oral   Take 1,000 mg by mouth every 6 (six) hours as needed. For pain         . ALBUTEROL SULFATE HFA 108 (90 BASE) MCG/ACT IN AERS   Inhalation   Inhale 2 puffs into the lungs every 6 (six) hours as  needed. For shortness of breath         . ALPRAZOLAM 0.25 MG PO TABS   Oral   Take 0.25 mg by mouth 3 (three) times daily as needed. For anxiety         . BENZONATATE 100 MG PO CAPS   Oral   Take 100 mg by mouth 3 (three) times daily as needed. For cough         . CALCIUM CARBONATE 1250 MG PO TABS   Oral   Take 1 tablet by mouth daily.         Marland Kitchen FLUOXETINE HCL 20 MG PO CAPS   Oral   Take 20 mg by mouth 2 (two) times daily.         . GUAIFENESIN ER 600 MG PO TB12   Oral   Take 1,200 mg by mouth 2 (two) times daily.         Marland Kitchen LEVOFLOXACIN 500 MG PO TABS   Oral   Take 500 mg by mouth daily. Started 1.1.14 for 7 days, ending 1.8.14         . LEVOTHYROXINE SODIUM 125 MCG PO TABS   Oral   Take 125 mcg by mouth daily with breakfast.           . NAPROXEN SODIUM 220 MG PO TABS   Oral   Take 220 mg by mouth 2 (two) times daily as needed. For pain         . OMEPRAZOLE 20 MG PO CPDR   Oral   Take 20 mg by mouth daily as needed. For indigestion         . OSELTAMIVIR PHOSPHATE 75 MG PO CAPS   Oral   Take 1 capsule (75 mg total) by mouth 2 (two) times daily.   10 capsule   0   . VITAMIN D (ERGOCALCIFEROL) 50000 UNITS PO CAPS   Oral   Take 50,000 Units by mouth every 7 (seven) days. On Mondays           BP 160/80  Pulse 74  Temp 98.2 F (36.8 C) (Oral)  Resp 16  SpO2 99%  Physical Exam  Constitutional: She is oriented to person, place, and time. She appears well-developed and well-nourished. No distress.  HENT:  Head: Normocephalic and atraumatic.  Right Ear: Hearing normal.  Nose: Nose normal.  Mouth/Throat: Oropharynx is clear and moist and mucous membranes are normal.  Eyes: Conjunctivae normal and EOM are normal. Pupils are equal, round, and reactive to light.  Neck: Normal range of motion. Neck supple.  Cardiovascular: Normal rate, regular rhythm, S1 normal and S2 normal.  Exam reveals no gallop and no friction rub.   No murmur heard. Pulmonary/Chest: Effort normal and breath sounds normal. No respiratory distress. She exhibits no tenderness.  Abdominal: Soft. Normal appearance and bowel sounds are normal. There is no hepatosplenomegaly. There is no tenderness. There is no rebound, no guarding, no tenderness at McBurney's point and negative Murphy's sign. No hernia.  Musculoskeletal: Normal range of motion.  Neurological: She is alert and oriented to person, place, and time. She has normal strength. No cranial nerve deficit or sensory deficit. Coordination normal. GCS eye subscore is 4. GCS verbal subscore is 5. GCS motor subscore is 6.  Skin: Skin is warm, dry and intact. No rash noted. No cyanosis.  Psychiatric: She has a normal mood and affect. Her speech is normal and behavior is normal. Thought content  normal.    ED  Course  Procedures (including critical care time)  Labs Reviewed  CBC WITH DIFFERENTIAL - Abnormal; Notable for the following:    WBC 3.6 (*)     RBC 3.50 (*)     Hemoglobin 11.7 (*)     HCT 35.1 (*)     MCV 100.3 (*)     Lymphs Abs 0.5 (*)     All other components within normal limits  COMPREHENSIVE METABOLIC PANEL - Abnormal; Notable for the following:    Potassium 3.3 (*)     Glucose, Bld 104 (*)     Total Bilirubin 0.2 (*)     All other components within normal limits  URINALYSIS, MICROSCOPIC ONLY   Ct Head Wo Contrast  02/13/2012  *RADIOLOGY REPORT*  Clinical Data: Trauma, emesis  CT HEAD WITHOUT CONTRAST  Technique:  Contiguous axial images were obtained from the base of the skull through the vertex without contrast.  Comparison: NONE Findings: No intracranial hemorrhage.  No parenchymal contusion. No midline shift or mass effect.  Basilar cisterns are patent. No skull base fracture.  No fluid in the paranasal sinuses or mastoid air cells.  There is a hypodensity in the external capsule on the left beneath the insular ribbon consistent with a white matter infarction. White matter hypodensities in the right external capsule which are less prominent.  IMPRESSION:  1.  No intracranial trauma. 2.  Deep white matter hypodensities in the external capsules are most consistent with chronic microvascular disease.   Original Report Authenticated By: Genevive Bi, M.D.      1. Influenza       MDM  Patient presents with flu symptoms. She was diagnosed with influenza by her primary doctor and started on Tamiflu yesterday. Her current symptoms are consistent with flu. She was concerned, however, because she has a headache he did hit her head in the past. CT of head was negative. Blood work was unremarkable. Patient reassured, hydrated and will be discharged with symptomatic treatment.        Gilda Crease, MD 02/13/12 2004

## 2012-02-13 NOTE — ED Notes (Addendum)
Pt reports she feels she needs to have a CT scan on her head d/t the fall. Pt reports her pcp sent her to ED to have a CT scan of her head

## 2012-02-13 NOTE — ED Notes (Signed)
Dx with flu yesterday-- also c/o fell Saturday night, hit head on concrete- no LOC, having vomiting and headache now

## 2012-02-13 NOTE — ED Notes (Signed)
Pt reports falling last Saturday while at [facility], headache x5 days, dx w/the flu yesterday, and vomiting x1 hour. Pt denies abd pain, chest pain, or diarrhea. Pt also dx w/Bronchitis on Tuesday, productive cough w/yellow colored mucus x3 days, pt currently taking Levaquin

## 2012-02-13 NOTE — ED Notes (Signed)
Pt c/o HA x 5 days after a fall where she tripped and landed on her elbows and forehead. Pt also c/o n/v. Pt reports she was dx with the flu and has been taking tamiflu since Wednesday. Pt wasn't sure if the vomiting was caused by the head injury or flu sx and wanted to rule that out. Pt was sent here by PCP for a head CT.

## 2012-02-13 NOTE — ED Notes (Signed)
Pt reports she is feeling much better

## 2012-02-18 ENCOUNTER — Encounter: Payer: Self-pay | Admitting: Family Medicine

## 2012-03-05 ENCOUNTER — Encounter: Payer: Self-pay | Admitting: Internal Medicine

## 2012-03-05 DIAGNOSIS — E039 Hypothyroidism, unspecified: Secondary | ICD-10-CM | POA: Diagnosis not present

## 2012-03-05 DIAGNOSIS — J0100 Acute maxillary sinusitis, unspecified: Secondary | ICD-10-CM | POA: Diagnosis not present

## 2012-03-05 DIAGNOSIS — D510 Vitamin B12 deficiency anemia due to intrinsic factor deficiency: Secondary | ICD-10-CM | POA: Diagnosis not present

## 2012-03-05 DIAGNOSIS — R5382 Chronic fatigue, unspecified: Secondary | ICD-10-CM | POA: Diagnosis not present

## 2012-03-09 DIAGNOSIS — E039 Hypothyroidism, unspecified: Secondary | ICD-10-CM | POA: Diagnosis not present

## 2012-03-09 DIAGNOSIS — J0100 Acute maxillary sinusitis, unspecified: Secondary | ICD-10-CM | POA: Diagnosis not present

## 2012-03-09 DIAGNOSIS — D510 Vitamin B12 deficiency anemia due to intrinsic factor deficiency: Secondary | ICD-10-CM | POA: Diagnosis not present

## 2012-03-09 DIAGNOSIS — R5382 Chronic fatigue, unspecified: Secondary | ICD-10-CM | POA: Diagnosis not present

## 2012-03-09 DIAGNOSIS — D649 Anemia, unspecified: Secondary | ICD-10-CM | POA: Diagnosis not present

## 2012-03-09 DIAGNOSIS — R05 Cough: Secondary | ICD-10-CM | POA: Diagnosis not present

## 2012-03-13 DIAGNOSIS — M810 Age-related osteoporosis without current pathological fracture: Secondary | ICD-10-CM | POA: Diagnosis not present

## 2012-03-13 DIAGNOSIS — D649 Anemia, unspecified: Secondary | ICD-10-CM | POA: Diagnosis not present

## 2012-03-25 DIAGNOSIS — D649 Anemia, unspecified: Secondary | ICD-10-CM | POA: Diagnosis not present

## 2012-04-02 ENCOUNTER — Ambulatory Visit (AMBULATORY_SURGERY_CENTER): Payer: Medicare Other | Admitting: *Deleted

## 2012-04-02 DIAGNOSIS — Z1211 Encounter for screening for malignant neoplasm of colon: Secondary | ICD-10-CM

## 2012-04-10 ENCOUNTER — Telehealth: Payer: Self-pay | Admitting: Internal Medicine

## 2012-04-10 NOTE — Telephone Encounter (Signed)
Pt called and states that she came in for a previsit for her colon on 04/02/12. Pt mentioned to previsit RN that she had been to an urgent care and her Hgb had been down in Jan. Pts Hgb was 11.7. Pt was taken off the schedule for a colon and scheduled for an OV with Dr. Marina Goodell. Pt state that her PCP Dr. Buren Kos rechecked her HGB and it was over 14, states he did not understand why her colon had been cancelled. Pt wants to know if she needs to have the OV now or can she just have the Colonoscopy that they originally planned for. Please advise.

## 2012-04-10 NOTE — Telephone Encounter (Signed)
Explain that anemia can't be from many causes. Often reassess to see if there are other areas of the gastrointestinal tract it may need investigated, in addition to the colon. That is why we often have patient seen a doctor in the office for anemia, prior to scheduling procedures. All in an effort to provide her with the best and most efficient healthcare. In any event, as her hemoglobin is normal, she may be set up for a direct colonoscopy. Thank you

## 2012-04-10 NOTE — Telephone Encounter (Signed)
Pt aware and scheduled for previsit and colon. Pt aware of appt dates and times.

## 2012-04-16 ENCOUNTER — Encounter: Payer: Medicare Other | Admitting: Internal Medicine

## 2012-04-20 ENCOUNTER — Encounter: Payer: Medicare Other | Admitting: Internal Medicine

## 2012-04-21 ENCOUNTER — Ambulatory Visit: Payer: Medicare Other | Admitting: Internal Medicine

## 2012-04-22 ENCOUNTER — Ambulatory Visit: Payer: Medicare Other | Admitting: Internal Medicine

## 2012-05-18 ENCOUNTER — Ambulatory Visit (AMBULATORY_SURGERY_CENTER): Payer: Medicare Other | Admitting: *Deleted

## 2012-05-18 ENCOUNTER — Telehealth: Payer: Self-pay | Admitting: *Deleted

## 2012-05-18 VITALS — Ht 61.0 in | Wt 105.0 lb

## 2012-05-18 DIAGNOSIS — Z1211 Encounter for screening for malignant neoplasm of colon: Secondary | ICD-10-CM

## 2012-05-18 MED ORDER — MOVIPREP 100 G PO SOLR: ORAL | Status: DC

## 2012-05-18 NOTE — Progress Notes (Addendum)
Medical Record Release sent to [facility] CMA.  [name] had her first 2 colon at [facility].  They were normal.  She has family history of colon cancer in her Mother.  Her colon is scheduled for 06/02/12 with Dr. Marina Goodell.    Pt. Did not sign the medical release form;  I left a message on her phone to call with fax number where I could send form for her to sign.  Pamela Larson

## 2012-05-18 NOTE — Telephone Encounter (Signed)
I spoke with Pamela Larson and she will call back in the morning and give [place] her Fax number so we can fax the Request for medical information form to her to sign.

## 2012-05-18 NOTE — Telephone Encounter (Signed)
Medical Record Release sent to [facility] CMA.  Pamela Larson had her first 2 colon at [facility].  They were normal.  She has family history of colon cancer in her Mother.  Her colon is scheduled for 06/02/12 with Dr. Marina Goodell.

## 2012-05-18 NOTE — Telephone Encounter (Signed)
Ms. Ladd did not sign her Medical Release Form.  I left a message for her to call [phone] and leave me a FAX number so I can get her sig.

## 2012-06-01 ENCOUNTER — Encounter: Payer: Self-pay | Admitting: *Deleted

## 2012-06-02 ENCOUNTER — Encounter: Payer: Self-pay | Admitting: Internal Medicine

## 2012-06-02 ENCOUNTER — Ambulatory Visit (AMBULATORY_SURGERY_CENTER): Payer: Medicare Other | Admitting: Internal Medicine

## 2012-06-02 VITALS — BP 126/58 | HR 67 | Temp 97.2°F | Resp 22 | Ht 61.0 in | Wt 105.0 lb

## 2012-06-02 DIAGNOSIS — Z800 Family history of malignant neoplasm of digestive organs: Secondary | ICD-10-CM | POA: Diagnosis not present

## 2012-06-02 DIAGNOSIS — D126 Benign neoplasm of colon, unspecified: Secondary | ICD-10-CM | POA: Diagnosis not present

## 2012-06-02 DIAGNOSIS — Z1211 Encounter for screening for malignant neoplasm of colon: Secondary | ICD-10-CM | POA: Diagnosis not present

## 2012-06-02 MED ORDER — SODIUM CHLORIDE 0.9 % IV SOLN: 500.0000 mL | INTRAVENOUS | Status: DC

## 2012-06-02 NOTE — Progress Notes (Signed)
Patient did not experience any of the following events: a burn prior to discharge; a fall within the facility; wrong site/side/patient/procedure/implant event; or a hospital transfer or hospital admission upon discharge from the facility. (G8907) Patient did not have preoperative order for IV antibiotic SSI prophylaxis. (G8918)  

## 2012-06-02 NOTE — Op Note (Signed)
[facility] [address] [place], [address]   COLONOSCOPY PROCEDURE REPORT  PATIENT: Pamela, [place]ta[place] Capucine Larson  MR#: [id number] BIRTHDATE: 1945-03-31 , 66  yrs. old GENDER: Female ENDOSCOPIST: Roxy Cedar, MD REFERRED BY:W.  Buren Kos, M.D. PROCEDURE DATE:  06/02/2012 PROCEDURE:   Colonoscopy with snare polypectomy  x 3 ASA CLASS:   Class II INDICATIONS:Patient's immediate family (mother 18's) history of colon cancer.  Prior colonoscopies at [facility] (no polyps). Last exam 6 yrs ago MEDICATIONS: MAC sedation, administered by CRNA and propofol (Diprivan) 350mg  IV  DESCRIPTION OF PROCEDURE:   After the risks benefits and alternatives of the procedure were thoroughly explained, informed consent was obtained.  A digital rectal exam revealed no abnormalities of the rectum.   The LB [id number]  endoscope was introduced through the anus and advanced to the cecum, which was identified by both the appendix and ileocecal valve. No adverse events experienced.   The quality of the prep was excellent, using MoviPrep  The instrument was then slowly withdrawn as the colon was fully examined.      COLON FINDINGS: Three polyps were found at the cecum (3mm), in the ascending colon (3mm), and transverse colon (6mm sessile). Polyps removed with cold snare , retrieved, and submitted.   The colon mucosa was otherwise normal.  Retroflexed views revealed no abnormalities. The time to cecum=5 minutes 05 seconds.  Withdrawal time=9 minutes 50 seconds.  The scope was withdrawn and the procedure completed. COMPLICATIONS: There were no complications.  ENDOSCOPIC IMPRESSION: 1.   Three polyps were found at the cecum, ascending colon, and transverse colon were removed with cold snare 2.   The colon mucosa was otherwise normal  RECOMMENDATIONS: 1. Follow up colonoscopy in 5 years   eSigned:  Roxy Cedar, MD 06/02/2012 9:37 AM   cc: Kari Baars, MD and The  Patient   PATIENT NAME:  Larson, Pamela Kassin MR#: [id number]

## 2012-06-02 NOTE — Patient Instructions (Addendum)

## 2012-06-02 NOTE — Progress Notes (Signed)
Called to room to assist during endoscopic procedure.  Patient ID and intended procedure confirmed with present staff. Received instructions for my participation in the procedure from the performing physician.  

## 2012-06-03 ENCOUNTER — Telehealth: Payer: Self-pay

## 2012-06-03 NOTE — Telephone Encounter (Signed)
  Follow up Call-  Call back number 06/02/2012  Post procedure Call Back phone  # [phone]  Permission to leave phone message Yes     Patient questions:  Do you have a fever, pain , or abdominal swelling? no Pain Score  0 *  Have you tolerated food without any problems? yes  Have you been able to return to your normal activities? yes  Do you have any questions about your discharge instructions: Diet   no Medications  no Follow up visit  no  Do you have questions or concerns about your Care? no  Actions: * If pain score is 4 or above: No action needed, pain <4.   No problems per the pt. Maw

## 2012-06-09 ENCOUNTER — Encounter: Payer: Self-pay | Admitting: Internal Medicine

## 2012-06-09 ENCOUNTER — Other Ambulatory Visit: Payer: Self-pay

## 2012-06-22 DIAGNOSIS — E039 Hypothyroidism, unspecified: Secondary | ICD-10-CM | POA: Diagnosis not present

## 2012-06-22 DIAGNOSIS — I951 Orthostatic hypotension: Secondary | ICD-10-CM | POA: Diagnosis not present

## 2012-06-22 DIAGNOSIS — E559 Vitamin D deficiency, unspecified: Secondary | ICD-10-CM | POA: Diagnosis not present

## 2012-06-22 DIAGNOSIS — D539 Nutritional anemia, unspecified: Secondary | ICD-10-CM | POA: Diagnosis not present

## 2012-06-22 DIAGNOSIS — D649 Anemia, unspecified: Secondary | ICD-10-CM | POA: Diagnosis not present

## 2012-06-29 DIAGNOSIS — D510 Vitamin B12 deficiency anemia due to intrinsic factor deficiency: Secondary | ICD-10-CM | POA: Diagnosis not present

## 2012-06-29 DIAGNOSIS — M810 Age-related osteoporosis without current pathological fracture: Secondary | ICD-10-CM | POA: Diagnosis not present

## 2012-06-29 DIAGNOSIS — IMO0001 Reserved for inherently not codable concepts without codable children: Secondary | ICD-10-CM | POA: Diagnosis not present

## 2012-06-29 DIAGNOSIS — F329 Major depressive disorder, single episode, unspecified: Secondary | ICD-10-CM | POA: Diagnosis not present

## 2012-06-29 DIAGNOSIS — E559 Vitamin D deficiency, unspecified: Secondary | ICD-10-CM | POA: Diagnosis not present

## 2012-06-29 DIAGNOSIS — Z23 Encounter for immunization: Secondary | ICD-10-CM | POA: Diagnosis not present

## 2012-06-29 DIAGNOSIS — Z0000 Encounter for general adult medical examination without abnormal findings: Secondary | ICD-10-CM | POA: Diagnosis not present

## 2012-06-29 DIAGNOSIS — E039 Hypothyroidism, unspecified: Secondary | ICD-10-CM | POA: Diagnosis not present

## 2012-06-29 DIAGNOSIS — R5382 Chronic fatigue, unspecified: Secondary | ICD-10-CM | POA: Diagnosis not present

## 2012-07-01 DIAGNOSIS — N3010 Interstitial cystitis (chronic) without hematuria: Secondary | ICD-10-CM | POA: Diagnosis not present

## 2012-07-15 DIAGNOSIS — D510 Vitamin B12 deficiency anemia due to intrinsic factor deficiency: Secondary | ICD-10-CM | POA: Diagnosis not present

## 2012-07-30 DIAGNOSIS — IMO0002 Reserved for concepts with insufficient information to code with codable children: Secondary | ICD-10-CM | POA: Diagnosis not present

## 2012-07-30 DIAGNOSIS — M47812 Spondylosis without myelopathy or radiculopathy, cervical region: Secondary | ICD-10-CM | POA: Diagnosis not present

## 2012-07-30 DIAGNOSIS — S139XXA Sprain of joints and ligaments of unspecified parts of neck, initial encounter: Secondary | ICD-10-CM | POA: Diagnosis not present

## 2012-07-30 DIAGNOSIS — D510 Vitamin B12 deficiency anemia due to intrinsic factor deficiency: Secondary | ICD-10-CM | POA: Diagnosis not present

## 2012-09-23 DIAGNOSIS — J019 Acute sinusitis, unspecified: Secondary | ICD-10-CM | POA: Diagnosis not present

## 2012-09-23 DIAGNOSIS — R05 Cough: Secondary | ICD-10-CM | POA: Diagnosis not present

## 2012-09-23 DIAGNOSIS — IMO0002 Reserved for concepts with insufficient information to code with codable children: Secondary | ICD-10-CM | POA: Diagnosis not present

## 2012-09-23 DIAGNOSIS — D649 Anemia, unspecified: Secondary | ICD-10-CM | POA: Diagnosis not present

## 2012-10-01 DIAGNOSIS — IMO0002 Reserved for concepts with insufficient information to code with codable children: Secondary | ICD-10-CM | POA: Diagnosis not present

## 2012-10-01 DIAGNOSIS — E559 Vitamin D deficiency, unspecified: Secondary | ICD-10-CM | POA: Diagnosis not present

## 2012-10-01 DIAGNOSIS — J019 Acute sinusitis, unspecified: Secondary | ICD-10-CM | POA: Diagnosis not present

## 2012-10-01 DIAGNOSIS — D510 Vitamin B12 deficiency anemia due to intrinsic factor deficiency: Secondary | ICD-10-CM | POA: Diagnosis not present

## 2012-10-01 DIAGNOSIS — E039 Hypothyroidism, unspecified: Secondary | ICD-10-CM | POA: Diagnosis not present

## 2012-10-01 DIAGNOSIS — M810 Age-related osteoporosis without current pathological fracture: Secondary | ICD-10-CM | POA: Diagnosis not present

## 2012-10-16 DIAGNOSIS — F329 Major depressive disorder, single episode, unspecified: Secondary | ICD-10-CM | POA: Diagnosis not present

## 2012-10-16 DIAGNOSIS — G9332 Myalgic encephalomyelitis/chronic fatigue syndrome: Secondary | ICD-10-CM | POA: Diagnosis not present

## 2012-10-16 DIAGNOSIS — D510 Vitamin B12 deficiency anemia due to intrinsic factor deficiency: Secondary | ICD-10-CM | POA: Diagnosis not present

## 2012-10-16 DIAGNOSIS — IMO0002 Reserved for concepts with insufficient information to code with codable children: Secondary | ICD-10-CM | POA: Diagnosis not present

## 2012-10-16 DIAGNOSIS — E039 Hypothyroidism, unspecified: Secondary | ICD-10-CM | POA: Diagnosis not present

## 2012-10-16 DIAGNOSIS — R5382 Chronic fatigue, unspecified: Secondary | ICD-10-CM | POA: Diagnosis not present

## 2012-10-16 DIAGNOSIS — D649 Anemia, unspecified: Secondary | ICD-10-CM | POA: Diagnosis not present

## 2012-10-16 DIAGNOSIS — J329 Chronic sinusitis, unspecified: Secondary | ICD-10-CM | POA: Diagnosis not present

## 2012-11-05 DIAGNOSIS — D510 Vitamin B12 deficiency anemia due to intrinsic factor deficiency: Secondary | ICD-10-CM | POA: Diagnosis not present

## 2012-11-16 DIAGNOSIS — H2510 Age-related nuclear cataract, unspecified eye: Secondary | ICD-10-CM | POA: Diagnosis not present

## 2012-11-26 DIAGNOSIS — D649 Anemia, unspecified: Secondary | ICD-10-CM | POA: Diagnosis not present

## 2012-11-26 DIAGNOSIS — E559 Vitamin D deficiency, unspecified: Secondary | ICD-10-CM | POA: Diagnosis not present

## 2012-11-26 DIAGNOSIS — M810 Age-related osteoporosis without current pathological fracture: Secondary | ICD-10-CM | POA: Diagnosis not present

## 2012-11-26 DIAGNOSIS — IMO0002 Reserved for concepts with insufficient information to code with codable children: Secondary | ICD-10-CM | POA: Diagnosis not present

## 2012-12-24 ENCOUNTER — Other Ambulatory Visit (HOSPITAL_COMMUNITY): Payer: Self-pay | Admitting: Internal Medicine

## 2012-12-24 ENCOUNTER — Ambulatory Visit (HOSPITAL_COMMUNITY): Admission: RE | Admit: 2012-12-24 | Payer: Medicare Other | Source: Ambulatory Visit

## 2012-12-24 DIAGNOSIS — D510 Vitamin B12 deficiency anemia due to intrinsic factor deficiency: Secondary | ICD-10-CM | POA: Diagnosis not present

## 2012-12-25 DIAGNOSIS — R5382 Chronic fatigue, unspecified: Secondary | ICD-10-CM | POA: Diagnosis not present

## 2012-12-25 DIAGNOSIS — L03039 Cellulitis of unspecified toe: Secondary | ICD-10-CM | POA: Diagnosis not present

## 2012-12-25 DIAGNOSIS — Z23 Encounter for immunization: Secondary | ICD-10-CM | POA: Diagnosis not present

## 2012-12-25 DIAGNOSIS — IMO0002 Reserved for concepts with insufficient information to code with codable children: Secondary | ICD-10-CM | POA: Diagnosis not present

## 2013-06-30 DIAGNOSIS — E559 Vitamin D deficiency, unspecified: Secondary | ICD-10-CM | POA: Diagnosis not present

## 2013-06-30 DIAGNOSIS — D472 Monoclonal gammopathy: Secondary | ICD-10-CM | POA: Diagnosis not present

## 2013-06-30 DIAGNOSIS — D649 Anemia, unspecified: Secondary | ICD-10-CM | POA: Diagnosis not present

## 2013-06-30 DIAGNOSIS — I951 Orthostatic hypotension: Secondary | ICD-10-CM | POA: Diagnosis not present

## 2013-06-30 DIAGNOSIS — E039 Hypothyroidism, unspecified: Secondary | ICD-10-CM | POA: Diagnosis not present

## 2013-07-07 DIAGNOSIS — E039 Hypothyroidism, unspecified: Secondary | ICD-10-CM | POA: Diagnosis not present

## 2013-07-07 DIAGNOSIS — N3010 Interstitial cystitis (chronic) without hematuria: Secondary | ICD-10-CM | POA: Diagnosis not present

## 2013-07-07 DIAGNOSIS — D472 Monoclonal gammopathy: Secondary | ICD-10-CM | POA: Diagnosis not present

## 2013-07-07 DIAGNOSIS — Z0000 Encounter for general adult medical examination without abnormal findings: Secondary | ICD-10-CM | POA: Diagnosis not present

## 2013-07-07 DIAGNOSIS — Z1331 Encounter for screening for depression: Secondary | ICD-10-CM | POA: Diagnosis not present

## 2013-07-07 DIAGNOSIS — D510 Vitamin B12 deficiency anemia due to intrinsic factor deficiency: Secondary | ICD-10-CM | POA: Diagnosis not present

## 2013-07-07 DIAGNOSIS — M810 Age-related osteoporosis without current pathological fracture: Secondary | ICD-10-CM | POA: Diagnosis not present

## 2013-07-07 DIAGNOSIS — Z23 Encounter for immunization: Secondary | ICD-10-CM | POA: Diagnosis not present

## 2013-07-07 DIAGNOSIS — IMO0001 Reserved for inherently not codable concepts without codable children: Secondary | ICD-10-CM | POA: Diagnosis not present

## 2013-07-07 DIAGNOSIS — E559 Vitamin D deficiency, unspecified: Secondary | ICD-10-CM | POA: Diagnosis not present

## 2013-07-09 DIAGNOSIS — Z1231 Encounter for screening mammogram for malignant neoplasm of breast: Secondary | ICD-10-CM | POA: Diagnosis not present

## 2013-07-09 DIAGNOSIS — Z803 Family history of malignant neoplasm of breast: Secondary | ICD-10-CM | POA: Diagnosis not present

## 2013-07-23 MED ORDER — ZOLEDRONIC ACID 5 MG/100ML IV SOLN: 5.0000 mg | Freq: Once | INTRAVENOUS | Status: DC

## 2013-07-26 ENCOUNTER — Encounter (HOSPITAL_COMMUNITY): Payer: Self-pay

## 2013-07-26 ENCOUNTER — Other Ambulatory Visit (HOSPITAL_COMMUNITY): Payer: Self-pay | Admitting: Internal Medicine

## 2013-07-26 ENCOUNTER — Ambulatory Visit (HOSPITAL_COMMUNITY)
Admission: RE | Admit: 2013-07-26 | Discharge: 2013-07-26 | Disposition: A | Discharge status: Home / Self Care | Payer: Medicare Other | Source: Ambulatory Visit | Attending: Internal Medicine | Admitting: Internal Medicine

## 2013-07-26 DIAGNOSIS — M810 Age-related osteoporosis without current pathological fracture: Secondary | ICD-10-CM | POA: Insufficient documentation

## 2013-07-26 HISTORY — DX: Unspecified thoracic, thoracolumbar and lumbosacral intervertebral disc disorder: M51.9

## 2013-07-26 MED ORDER — ZOLEDRONIC ACID 5 MG/100ML IV SOLN
5.0000 mg | Freq: Once | INTRAVENOUS | Status: AC
  Administered 2013-07-26: 5 mg via INTRAVENOUS
  Filled 2013-07-26: qty 100

## 2013-07-26 MED ORDER — SODIUM CHLORIDE 0.9 % IV SOLN
INTRAVENOUS | Status: DC
  Administered 2013-07-26: 14:00:00 via INTRAVENOUS

## 2013-08-03 DIAGNOSIS — B029 Zoster without complications: Secondary | ICD-10-CM | POA: Diagnosis not present

## 2013-08-03 DIAGNOSIS — IMO0002 Reserved for concepts with insufficient information to code with codable children: Secondary | ICD-10-CM | POA: Diagnosis not present

## 2013-08-03 DIAGNOSIS — D510 Vitamin B12 deficiency anemia due to intrinsic factor deficiency: Secondary | ICD-10-CM | POA: Diagnosis not present

## 2013-09-19 ENCOUNTER — Ambulatory Visit (INDEPENDENT_AMBULATORY_CARE_PROVIDER_SITE_OTHER): Payer: Medicare Other

## 2013-09-19 ENCOUNTER — Ambulatory Visit (INDEPENDENT_AMBULATORY_CARE_PROVIDER_SITE_OTHER): Payer: Medicare Other | Admitting: Family Medicine

## 2013-09-19 VITALS — BP 134/80 | HR 80 | Temp 98.5°F | Resp 12 | Ht 60.71 in | Wt 105.1 lb

## 2013-09-19 DIAGNOSIS — R059 Cough, unspecified: Secondary | ICD-10-CM

## 2013-09-19 DIAGNOSIS — Z8701 Personal history of pneumonia (recurrent): Secondary | ICD-10-CM

## 2013-09-19 DIAGNOSIS — R0789 Other chest pain: Secondary | ICD-10-CM

## 2013-09-19 DIAGNOSIS — J069 Acute upper respiratory infection, unspecified: Secondary | ICD-10-CM

## 2013-09-19 DIAGNOSIS — R05 Cough: Secondary | ICD-10-CM

## 2013-09-19 MED ORDER — AZITHROMYCIN 250 MG PO TABS: ORAL_TABLET | ORAL | Status: DC

## 2013-09-19 NOTE — Patient Instructions (Addendum)
We can start antibiotic, and will have reading from radiologist in next day or two.  There is a possible infection in right side of xray, but this can also be scarring form a previous pneumonia.  We can always recheck a chest xray in 4-6 weeks to make sure this resolves.  Mucinex if needed for cough, rest and drink plenty of fluids.  Return to the clinic or go to the nearest emergency room if any of your symptoms worsen or new symptoms occur.  Cough, Adult  A cough is a reflex that helps clear your throat and airways. It can help heal the body or may be a reaction to an irritated airway. A cough may only last 2 or 3 weeks (acute) or may last more than 8 weeks (chronic).  CAUSES Acute cough:  Viral or bacterial infections. Chronic cough:  Infections.  Allergies.  Asthma.  Post-nasal drip.  Smoking.  Heartburn or acid reflux.  Some medicines.  Chronic lung problems (COPD).  Cancer. SYMPTOMS   Cough.  Fever.  Chest pain.  Increased breathing rate.  High-pitched whistling sound when breathing (wheezing).  Colored mucus that you cough up (sputum). TREATMENT   A bacterial cough may be treated with antibiotic medicine.  A viral cough must run its course and will not respond to antibiotics.  Your caregiver may recommend other treatments if you have a chronic cough. HOME CARE INSTRUCTIONS   Only take over-the-counter or prescription medicines for pain, discomfort, or fever as directed by your caregiver. Use cough suppressants only as directed by your caregiver.  Use a cold steam vaporizer or humidifier in your bedroom or home to help loosen secretions.  Sleep in a semi-upright position if your cough is worse at night.  Rest as needed.  Stop smoking if you smoke. SEEK IMMEDIATE MEDICAL CARE IF:   You have pus in your sputum.  Your cough starts to worsen.  You cannot control your cough with suppressants and are losing sleep.  You begin coughing up  blood.  You have difficulty breathing.  You develop pain which is getting worse or is uncontrolled with medicine.  You have a fever. MAKE SURE YOU:   Understand these instructions.  Will watch your condition.  Will get help right away if you are not doing well or get worse. Document Released: 07/27/2010 Document Revised: 04/22/2011 Document Reviewed: 07/27/2010 [facility] Patient Information 2015 [place], [place]. This information is not intended to replace advice given to you by your health care provider. Make sure you discuss any questions you have with your health care provider.

## 2013-09-19 NOTE — Progress Notes (Signed)
This chart was scribed for Pamela Ray, MD by Thea Alken, ED Scribe. This patient was seen in room 2 and the patient's care was started at 4:08 PM.  Subjective:    Patient ID: Pamela AGRUSA, female    DOB: [birth year], 68 y.o.   MRN: [id number]  Cough Associated symptoms include chills and rhinorrhea. Pertinent negatives include no chest pain or fever.  URI  Associated symptoms include coughing and rhinorrhea. Pertinent negatives include no chest pain.   Chief Complaint  Patient presents with  . Cough    Chest Tightness  . Chills  . URI   HPI Comments: Pamela Larson is a 68 y.o. female who presents to the [facility] complaining of URI symptoms onset last night. Pt states she woke up this morning with chest tightness, chest congestion and shoulder pain followed by a cough, chills, and mild rhinnorrhea. She also c/o of scratchy voice with tightness in throat that she believes is laryngitis. Pt denies taking OTC medication. Pt denies h/o heart problem, heart disease, and HTN. Pt denies sick contacts  Pt denies fever.  Pt has h/o pneumonia 4 years ago. Pt reports last having blood work in the spring.   Had breast reduction surgery in the past, and with this, has to have other readings done at times with breast exam (?scar tissue).   Past Medical History  Diagnosis Date  . Pneumonia     hx of   . Hypothyroidism   . Anemia     pernicious anemia - 1991   . Chronic kidney disease     hx of interstitial cystitis   . 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 Arthritis     rheumatoid arthritis   . Depression   . Lyme disease     hx of possible   . Fibromyalgia   . Osteoporosis   . Anxiety   . Disc disorder of lumbar region 2013   Past Surgical History  Procedure Laterality Date  . Bladder hydrodistention  1998  . Lumbar laminectomy/decompression microdiscectomy  06/10/2011    Procedure: LUMBAR  LAMINECTOMY/DECOMPRESSION MICRODISCECTOMY 1 LEVEL;  Surgeon: Hosie Spangle, MD;  Location: [facility];  Service: Neurosurgery;  Laterality: Left;  Lumbar Five Sacral One Lumbar Laminectomy Decompression Microdiscectomy   . Breast surgery      augment  . Appendectomy  1978   Prior to Admission medications   Medication Sig Start Date End Date Taking? Authorizing Provider  acetaminophen (TYLENOL) 500 MG tablet Take 1,000 mg by mouth every 6 (six) hours as needed. For pain   Yes Historical Provider, MD  albuterol (PROVENTIL HFA;VENTOLIN HFA) 108 (90 BASE) MCG/ACT inhaler Inhale 2 puffs into the lungs every 6 (six) hours as needed. For shortness of breath   Yes Historical Provider, MD  ALPRAZolam (XANAX) 0.25 MG tablet Take 0.25 mg by mouth 3 (three) times daily as needed. For anxiety   Yes Historical Provider, MD  calcium carbonate (OS-CAL - DOSED IN MG OF ELEMENTAL CALCIUM) 1250 MG tablet Take 1 tablet by mouth daily.   Yes Historical Provider, MD  FLUoxetine (PROZAC) 20 MG capsule Take 20 mg by mouth 2 (two) times daily.   Yes Historical Provider, MD  guaiFENesin (MUCINEX) 600 MG 12 hr tablet Take 1,200 mg by mouth 2 (two) times daily.   Yes Historical Provider, MD  levothyroxine (SYNTHROID, LEVOTHROID) 125 MCG tablet Take 125 mcg by  mouth daily with breakfast.   Yes Historical Provider, MD  omeprazole (PRILOSEC) 20 MG capsule Take 20 mg by mouth daily as needed. For indigestion   Yes Historical Provider, MD  Vitamin D, Ergocalciferol, (DRISDOL) 50000 UNITS CAPS Take 50,000 Units by mouth every 7 (seven) days. On Mondays   Yes Historical Provider, MD  benzonatate (TESSALON) 100 MG capsule Take 100 mg by mouth 3 (three) times daily as needed. For cough    Historical Provider, MD   Review of Systems  Constitutional: Positive for chills. Negative for fever.  HENT: Positive for rhinorrhea.   Respiratory: Positive for cough and chest tightness.   Cardiovascular: Negative for chest pain.    Objective:   Physical Exam  Vitals reviewed. Constitutional: She is oriented to person, place, and time. She appears well-developed and well-nourished. No distress.  HENT:  Head: Normocephalic and atraumatic.  Right Ear: Hearing, tympanic membrane, external ear and ear canal normal.  Left Ear: Hearing, tympanic membrane, external ear and ear canal normal.  Nose: Nose normal.  Mouth/Throat: Oropharynx is clear and moist. No oropharyngeal exudate.  Eyes: Conjunctivae and EOM are normal. Pupils are equal, round, and reactive to light.  Cardiovascular: Normal rate, regular rhythm, normal heart sounds and intact distal pulses.  Exam reveals no gallop and no friction rub.   No murmur heard. Pulmonary/Chest: Effort normal and breath sounds normal. No stridor ( breathing normally). No respiratory distress. She has no wheezes. She has no rhonchi. She has no rales. She exhibits no tenderness.  Neurological: She is alert and oriented to person, place, and time.  Skin: Skin is warm and dry. No rash noted.  Psychiatric: She has a normal mood and affect. Her behavior is normal.   Filed Vitals:   09/19/13 1600  BP: 134/80  Pulse: 80  Temp: 98.5 F (36.9 C)  TempSrc: Oral  Resp: 12  Height: 5' 0.71" (1.542 m)  Weight: 105 lb 2 oz (47.684 kg)  SpO2: 96%   [facility] reading (PRIMARY) by Dr. Carlota Raspberry. No acute findings. Increased right lower lobe marking without infiltrate.    EKG reading. Sinus rhythm low voltage but no acute finding   Assessment & Plan:   Pamela Larson is a 68 y.o. female Cough - Plan: DG Chest 2 View, azithromycin (ZITHROMAX) 250 MG tablet  Chest tightness - Plan: DG Chest 2 View, EKG 12-Lead, azithromycin (ZITHROMAX) 250 MG tablet  Acute upper respiratory infections of unspecified site - Plan: azithromycin (ZITHROMAX) 250 MG tablet  History of pneumonia  Sx's of cough, malaise, chest tightness since last night, no true fever, but subjective f/c and scratchy throat.  Viral syndrome possible, but possible mild infiltrate/increased markings RLL. Will start zpak, XR to over read, Mucinex and sx care. ER/RTC precautions discussed.  Consider repeat CXR in 4-6 weeks if infiltrate is present on overread.    Meds ordered this encounter  Medications  . azithromycin (ZITHROMAX) 250 MG tablet    Sig: Take 2 pills by mouth on day 1, then 1 pill by mouth per day on days 2 through 5.    Dispense:  6 tablet    Refill:  0   Patient Instructions  We can start antibiotic, and will have reading from radiologist in next day or two.  There is a possible infection in right side of xray, but this can also be scarring form a previous pneumonia.  We can always recheck a chest xray in 4-6 weeks to make sure this resolves.  Mucinex  if needed for cough, rest and drink plenty of fluids.  Return to the clinic or go to the nearest emergency room if any of your symptoms worsen or new symptoms occur.  Cough, Adult  A cough is a reflex that helps clear your throat and airways. It can help heal the body or may be a reaction to an irritated airway. A cough may only last 2 or 3 weeks (acute) or may last more than 8 weeks (chronic).  CAUSES Acute cough:  Viral or bacterial infections. Chronic cough:  Infections.  Allergies.  Asthma.  Post-nasal drip.  Smoking.  Heartburn or acid reflux.  Some medicines.  Chronic lung problems (COPD).  Cancer. SYMPTOMS   Cough.  Fever.  Chest pain.  Increased breathing rate.  High-pitched whistling sound when breathing (wheezing).  Colored mucus that you cough up (sputum). TREATMENT   A bacterial cough may be treated with antibiotic medicine.  A viral cough must run its course and will not respond to antibiotics.  Your caregiver may recommend other treatments if you have a chronic cough. HOME CARE INSTRUCTIONS   Only take over-the-counter or prescription medicines for pain, discomfort, or fever as directed by your  caregiver. Use cough suppressants only as directed by your caregiver.  Use a cold steam vaporizer or humidifier in your bedroom or home to help loosen secretions.  Sleep in a semi-upright position if your cough is worse at night.  Rest as needed.  Stop smoking if you smoke. SEEK IMMEDIATE MEDICAL CARE IF:   You have pus in your sputum.  Your cough starts to worsen.  You cannot control your cough with suppressants and are losing sleep.  You begin coughing up blood.  You have difficulty breathing.  You develop pain which is getting worse or is uncontrolled with medicine.  You have a fever. MAKE SURE YOU:   Understand these instructions.  Will watch your condition.  Will get help right away if you are not doing well or get worse. Document Released: 07/27/2010 Document Revised: 04/22/2011 Document Reviewed: 07/27/2010 [facility] Patient Information 2015 [place], [place]. This information is not intended to replace advice given to you by your health care provider. Make sure you discuss any questions you have with your health care provider.     I personally performed the services described in this documentation, which was scribed in my presence. The recorded information has been reviewed and considered, and addended by me as needed.

## 2013-09-20 ENCOUNTER — Telehealth: Payer: Self-pay

## 2013-09-20 NOTE — Telephone Encounter (Signed)
Pt requesting x-ray cd of chest and her ekg results,she will sign release when she is called to pick up same   Best phone number is [phone]

## 2013-09-20 NOTE — Telephone Encounter (Signed)
CxR copy made on CD at the patient's request and put in the pick up drawer.

## 2013-09-20 NOTE — Telephone Encounter (Signed)
Gave Xray a copy of this message to make copy of CD. Printed EKG and put in pick up drawer. LM for pt to advise pick up.

## 2013-10-03 ENCOUNTER — Ambulatory Visit: Payer: Medicare Other

## 2013-10-03 DIAGNOSIS — J209 Acute bronchitis, unspecified: Secondary | ICD-10-CM | POA: Diagnosis not present

## 2013-10-05 DIAGNOSIS — R059 Cough, unspecified: Secondary | ICD-10-CM | POA: Diagnosis not present

## 2013-10-05 DIAGNOSIS — J209 Acute bronchitis, unspecified: Secondary | ICD-10-CM | POA: Diagnosis not present

## 2013-10-05 DIAGNOSIS — IMO0002 Reserved for concepts with insufficient information to code with codable children: Secondary | ICD-10-CM | POA: Diagnosis not present

## 2013-10-05 DIAGNOSIS — R05 Cough: Secondary | ICD-10-CM | POA: Diagnosis not present

## 2013-10-07 DIAGNOSIS — M810 Age-related osteoporosis without current pathological fracture: Secondary | ICD-10-CM | POA: Diagnosis not present

## 2013-10-07 DIAGNOSIS — B370 Candidal stomatitis: Secondary | ICD-10-CM | POA: Diagnosis not present

## 2013-10-07 DIAGNOSIS — IMO0002 Reserved for concepts with insufficient information to code with codable children: Secondary | ICD-10-CM | POA: Diagnosis not present

## 2013-10-07 DIAGNOSIS — D510 Vitamin B12 deficiency anemia due to intrinsic factor deficiency: Secondary | ICD-10-CM | POA: Diagnosis not present

## 2013-10-07 DIAGNOSIS — J209 Acute bronchitis, unspecified: Secondary | ICD-10-CM | POA: Diagnosis not present

## 2013-10-07 DIAGNOSIS — E039 Hypothyroidism, unspecified: Secondary | ICD-10-CM | POA: Diagnosis not present

## 2013-10-20 DIAGNOSIS — R05 Cough: Secondary | ICD-10-CM | POA: Diagnosis not present

## 2013-10-20 DIAGNOSIS — IMO0002 Reserved for concepts with insufficient information to code with codable children: Secondary | ICD-10-CM | POA: Diagnosis not present

## 2013-10-20 DIAGNOSIS — R059 Cough, unspecified: Secondary | ICD-10-CM | POA: Diagnosis not present

## 2013-10-20 DIAGNOSIS — J069 Acute upper respiratory infection, unspecified: Secondary | ICD-10-CM | POA: Diagnosis not present

## 2013-10-22 DIAGNOSIS — IMO0002 Reserved for concepts with insufficient information to code with codable children: Secondary | ICD-10-CM | POA: Diagnosis not present

## 2013-10-22 DIAGNOSIS — J069 Acute upper respiratory infection, unspecified: Secondary | ICD-10-CM | POA: Diagnosis not present

## 2013-10-29 DIAGNOSIS — R059 Cough, unspecified: Secondary | ICD-10-CM | POA: Diagnosis not present

## 2013-10-29 DIAGNOSIS — IMO0002 Reserved for concepts with insufficient information to code with codable children: Secondary | ICD-10-CM | POA: Diagnosis not present

## 2013-10-29 DIAGNOSIS — J069 Acute upper respiratory infection, unspecified: Secondary | ICD-10-CM | POA: Diagnosis not present

## 2013-10-29 DIAGNOSIS — R05 Cough: Secondary | ICD-10-CM | POA: Diagnosis not present

## 2013-11-02 ENCOUNTER — Emergency Department (HOSPITAL_COMMUNITY): Payer: Medicare Other

## 2013-11-02 ENCOUNTER — Encounter (HOSPITAL_COMMUNITY): Payer: Self-pay | Admitting: Emergency Medicine

## 2013-11-02 ENCOUNTER — Emergency Department (HOSPITAL_COMMUNITY)
Admission: EM | Admit: 2013-11-02 | Discharge: 2013-11-02 | Disposition: A | Discharge status: Home / Self Care | Payer: Medicare Other | Attending: Emergency Medicine | Admitting: Emergency Medicine

## 2013-11-02 DIAGNOSIS — M069 Rheumatoid arthritis, unspecified: Secondary | ICD-10-CM | POA: Diagnosis not present

## 2013-11-02 DIAGNOSIS — IMO0001 Reserved for inherently not codable concepts without codable children: Secondary | ICD-10-CM | POA: Insufficient documentation

## 2013-11-02 DIAGNOSIS — G9332 Myalgic encephalomyelitis/chronic fatigue syndrome: Secondary | ICD-10-CM | POA: Diagnosis not present

## 2013-11-02 DIAGNOSIS — Z8701 Personal history of pneumonia (recurrent): Secondary | ICD-10-CM | POA: Insufficient documentation

## 2013-11-02 DIAGNOSIS — Z880 Allergy status to penicillin: Secondary | ICD-10-CM | POA: Diagnosis not present

## 2013-11-02 DIAGNOSIS — IMO0002 Reserved for concepts with insufficient information to code with codable children: Secondary | ICD-10-CM | POA: Diagnosis not present

## 2013-11-02 DIAGNOSIS — S0180XA Unspecified open wound of other part of head, initial encounter: Secondary | ICD-10-CM | POA: Insufficient documentation

## 2013-11-02 DIAGNOSIS — W19XXXA Unspecified fall, initial encounter: Secondary | ICD-10-CM

## 2013-11-02 DIAGNOSIS — S199XXA Unspecified injury of neck, initial encounter: Secondary | ICD-10-CM | POA: Diagnosis not present

## 2013-11-02 DIAGNOSIS — Z79899 Other long term (current) drug therapy: Secondary | ICD-10-CM | POA: Insufficient documentation

## 2013-11-02 DIAGNOSIS — F3289 Other specified depressive episodes: Secondary | ICD-10-CM | POA: Insufficient documentation

## 2013-11-02 DIAGNOSIS — S0100XA Unspecified open wound of scalp, initial encounter: Secondary | ICD-10-CM | POA: Diagnosis not present

## 2013-11-02 DIAGNOSIS — M5137 Other intervertebral disc degeneration, lumbosacral region: Secondary | ICD-10-CM | POA: Diagnosis not present

## 2013-11-02 DIAGNOSIS — F329 Major depressive disorder, single episode, unspecified: Secondary | ICD-10-CM | POA: Diagnosis not present

## 2013-11-02 DIAGNOSIS — Z862 Personal history of diseases of the blood and blood-forming organs and certain disorders involving the immune mechanism: Secondary | ICD-10-CM | POA: Insufficient documentation

## 2013-11-02 DIAGNOSIS — M51379 Other intervertebral disc degeneration, lumbosacral region without mention of lumbar back pain or lower extremity pain: Secondary | ICD-10-CM | POA: Diagnosis not present

## 2013-11-02 DIAGNOSIS — Z87891 Personal history of nicotine dependence: Secondary | ICD-10-CM | POA: Insufficient documentation

## 2013-11-02 DIAGNOSIS — Z8669 Personal history of other diseases of the nervous system and sense organs: Secondary | ICD-10-CM | POA: Insufficient documentation

## 2013-11-02 DIAGNOSIS — S0990XA Unspecified injury of head, initial encounter: Secondary | ICD-10-CM | POA: Diagnosis not present

## 2013-11-02 DIAGNOSIS — Y9389 Activity, other specified: Secondary | ICD-10-CM | POA: Insufficient documentation

## 2013-11-02 DIAGNOSIS — Y92009 Unspecified place in unspecified non-institutional (private) residence as the place of occurrence of the external cause: Secondary | ICD-10-CM | POA: Diagnosis not present

## 2013-11-02 DIAGNOSIS — F411 Generalized anxiety disorder: Secondary | ICD-10-CM | POA: Diagnosis not present

## 2013-11-02 DIAGNOSIS — E039 Hypothyroidism, unspecified: Secondary | ICD-10-CM | POA: Insufficient documentation

## 2013-11-02 DIAGNOSIS — S0003XA Contusion of scalp, initial encounter: Secondary | ICD-10-CM | POA: Insufficient documentation

## 2013-11-02 DIAGNOSIS — Z8619 Personal history of other infectious and parasitic diseases: Secondary | ICD-10-CM | POA: Insufficient documentation

## 2013-11-02 DIAGNOSIS — S0993XA Unspecified injury of face, initial encounter: Secondary | ICD-10-CM | POA: Diagnosis not present

## 2013-11-02 DIAGNOSIS — W06XXXA Fall from bed, initial encounter: Secondary | ICD-10-CM | POA: Diagnosis not present

## 2013-11-02 DIAGNOSIS — N189 Chronic kidney disease, unspecified: Secondary | ICD-10-CM | POA: Diagnosis not present

## 2013-11-02 DIAGNOSIS — M810 Age-related osteoporosis without current pathological fracture: Secondary | ICD-10-CM | POA: Diagnosis not present

## 2013-11-02 DIAGNOSIS — R5382 Chronic fatigue, unspecified: Secondary | ICD-10-CM | POA: Diagnosis not present

## 2013-11-02 DIAGNOSIS — T148XXA Other injury of unspecified body region, initial encounter: Secondary | ICD-10-CM | POA: Diagnosis not present

## 2013-11-02 DIAGNOSIS — D510 Vitamin B12 deficiency anemia due to intrinsic factor deficiency: Secondary | ICD-10-CM | POA: Diagnosis not present

## 2013-11-02 DIAGNOSIS — J069 Acute upper respiratory infection, unspecified: Secondary | ICD-10-CM | POA: Diagnosis not present

## 2013-11-02 DIAGNOSIS — S0181XA Laceration without foreign body of other part of head, initial encounter: Secondary | ICD-10-CM

## 2013-11-02 DIAGNOSIS — S1093XA Contusion of unspecified part of neck, initial encounter: Secondary | ICD-10-CM

## 2013-11-02 DIAGNOSIS — S0083XA Contusion of other part of head, initial encounter: Secondary | ICD-10-CM | POA: Insufficient documentation

## 2013-11-02 LAB — COMPREHENSIVE METABOLIC PANEL
ALBUMIN: 3.8 g/dL (ref 3.5–5.2)
ALK PHOS: 59 U/L (ref 39–117)
ALT: 10 U/L (ref 0–35)
AST: 18 U/L (ref 0–37)
Anion gap: 18 — ABNORMAL HIGH (ref 5–15)
BUN: 10 mg/dL (ref 6–23)
CHLORIDE: 99 meq/L (ref 96–112)
CO2: 22 meq/L (ref 19–32)
Calcium: 9.1 mg/dL (ref 8.4–10.5)
Creatinine, Ser: 0.69 mg/dL (ref 0.50–1.10)
GFR calc Af Amer: >90 mL/min (ref >90)
GFR, EST NON AFRICAN AMERICAN: 89 mL/min — AB (ref >90)
Glucose, Bld: 115 mg/dL — ABNORMAL HIGH (ref 70–99)
POTASSIUM: 3.4 meq/L — AB (ref 3.7–5.3)
SODIUM: 139 meq/L (ref 137–147)
Total Protein: 6.9 g/dL (ref 6.0–8.3)

## 2013-11-02 LAB — CBC WITH DIFFERENTIAL/PLATELET
BASOS ABS: 0 10*3/uL (ref 0.0–0.1)
BASOS PCT: 0 % (ref 0–1)
Eosinophils Absolute: 0.1 10*3/uL (ref 0.0–0.7)
Eosinophils Relative: 1 % (ref 0–5)
HEMATOCRIT: 41 % (ref 36.0–46.0)
Hemoglobin: 14 g/dL (ref 12.0–15.0)
LYMPHS PCT: 25 % (ref 12–46)
Lymphs Abs: 1.9 10*3/uL (ref 0.7–4.0)
MCH: 33.7 pg (ref 26.0–34.0)
MCHC: 34.1 g/dL (ref 30.0–36.0)
MCV: 98.6 fL (ref 78.0–100.0)
MONO ABS: 0.5 10*3/uL (ref 0.1–1.0)
Monocytes Relative: 7 % (ref 3–12)
NEUTROS ABS: 5.1 10*3/uL (ref 1.7–7.7)
NEUTROS PCT: 67 % (ref 43–77)
PLATELETS: 310 10*3/uL (ref 150–400)
RBC: 4.16 MIL/uL (ref 3.87–5.11)
RDW: 13.1 % (ref 11.5–15.5)
WBC: 7.5 10*3/uL (ref 4.0–10.5)

## 2013-11-02 LAB — URINALYSIS, ROUTINE W REFLEX MICROSCOPIC
Bilirubin Urine: NEGATIVE
GLUCOSE, UA: NEGATIVE mg/dL
HGB URINE DIPSTICK: NEGATIVE
KETONES UR: NEGATIVE mg/dL
LEUKOCYTES UA: NEGATIVE
Nitrite: NEGATIVE
PH: 5.5 (ref 5.0–8.0)
PROTEIN: NEGATIVE mg/dL
Specific Gravity, Urine: 1.005 (ref 1.005–1.030)
Urobilinogen, UA: 0.2 mg/dL (ref 0.0–1.0)

## 2013-11-02 MED ORDER — TRAMADOL HCL 50 MG PO TABS: 50.0000 mg | ORAL_TABLET | Freq: Four times a day (QID) | ORAL | Status: DC | PRN

## 2013-11-02 MED ORDER — LIDOCAINE-EPINEPHRINE (PF) 2 %-1:200000 IJ SOLN
10.0000 mg | Freq: Once | INTRAMUSCULAR | Status: AC
  Administered 2013-11-02: 10 mg
  Filled 2013-11-02: qty 20

## 2013-11-02 MED ORDER — TRAMADOL HCL 50 MG PO TABS
50.0000 mg | ORAL_TABLET | Freq: Once | ORAL | Status: AC
  Administered 2013-11-02: 50 mg via ORAL
  Filled 2013-11-02: qty 1

## 2013-11-02 NOTE — ED Notes (Signed)
Patient transported to CT 

## 2013-11-02 NOTE — Discharge Instructions (Signed)
Sutures need to be removed in 4-5 days. That can be done here, at your doctor's office, or at an urgent care center.  Fall Prevention and Home Safety Falls cause injuries and can affect all age groups. It is possible to use preventive measures to significantly decrease the likelihood of falls. There are many simple measures which can make your home safer and prevent falls. OUTDOORS  Repair cracks and edges of walkways and driveways.  Remove high doorway thresholds.  Trim shrubbery on the main path into your home.  Have good outside lighting.  Clear walkways of tools, rocks, debris, and clutter.  Check that handrails are not broken and are securely fastened. Both sides of steps should have handrails.  Have leaves, snow, and ice cleared regularly.  Use sand or salt on walkways during winter months.  In the garage, clean up grease or oil spills. BATHROOM  Install night lights.  Install grab bars by the toilet and in the tub and shower.  Use non-skid mats or decals in the tub or shower.  Place a plastic non-slip stool in the shower to sit on, if needed.  Keep floors dry and clean up all water on the floor immediately.  Remove soap buildup in the tub or shower on a regular basis.  Secure bath mats with non-slip, double-sided rug tape.  Remove throw rugs and tripping hazards from the floors. BEDROOMS  Install night lights.  Make sure a bedside light is easy to reach.  Do not use oversized bedding.  Keep a telephone by your bedside.  Have a firm chair with side arms to use for getting dressed.  Remove throw rugs and tripping hazards from the floor. KITCHEN  Keep handles on pots and pans turned toward the center of the stove. Use back burners when possible.  Clean up spills quickly and allow time for drying.  Avoid walking on wet floors.  Avoid hot utensils and knives.  Position shelves so they are not too high or low.  Place commonly used objects within easy  reach.  If necessary, use a sturdy step stool with a grab bar when reaching.  Keep electrical cables out of the way.  Do not use floor polish or wax that makes floors slippery. If you must use wax, use non-skid floor wax.  Remove throw rugs and tripping hazards from the floor. STAIRWAYS  Never leave objects on stairs.  Place handrails on both sides of stairways and use them. Fix any loose handrails. Make sure handrails on both sides of the stairways are as long as the stairs.  Check carpeting to make sure it is firmly attached along stairs. Make repairs to worn or loose carpet promptly.  Avoid placing throw rugs at the top or bottom of stairways, or properly secure the rug with carpet tape to prevent slippage. Get rid of throw rugs, if possible.  Have an electrician put in a light switch at the top and bottom of the stairs. OTHER FALL PREVENTION TIPS  Wear low-heel or rubber-soled shoes that are supportive and fit well. Wear closed toe shoes.  When using a stepladder, make sure it is fully opened and both spreaders are firmly locked. Do not climb a closed stepladder.  Add color or contrast paint or tape to grab bars and handrails in your home. Place contrasting color strips on first and last steps.  Learn and use mobility aids as needed. Install an electrical emergency response system.  Turn on lights to avoid dark areas. Replace  light bulbs that burn out immediately. Get light switches that glow.  Arrange furniture to create clear pathways. Keep furniture in the same place.  Firmly attach carpet with non-skid or double-sided tape.  Eliminate uneven floor surfaces.  Select a carpet pattern that does not visually hide the edge of steps.  Be aware of all pets. OTHER HOME SAFETY TIPS  Set the water temperature for 120 F (48.8 C).  Keep emergency numbers on or near the telephone.  Keep smoke detectors on every level of the home and near sleeping areas. Document Released:  01/18/2002 Document Revised: 07/30/2011 Document Reviewed: 04/19/2011 [facility] Patient Information 2015 [place], [place]. This information is not intended to replace advice given to you by your health care provider. Make sure you discuss any questions you have with your health care provider.   Contusion A contusion is a deep bruise. Contusions are the result of an injury that caused bleeding under the skin. The contusion may turn blue, purple, or yellow. Minor injuries will give you a painless contusion, but more severe contusions may stay painful and swollen for a few weeks.  CAUSES  A contusion is usually caused by a blow, trauma, or direct force to an area of the body. SYMPTOMS   Swelling and redness of the injured area.  Bruising of the injured area.  Tenderness and soreness of the injured area.  Pain. DIAGNOSIS  The diagnosis can be made by taking a history and physical exam. An X-ray, CT scan, or MRI may be needed to determine if there were any associated injuries, such as fractures. TREATMENT  Specific treatment will depend on what area of the body was injured. In general, the best treatment for a contusion is resting, icing, elevating, and applying cold compresses to the injured area. Over-the-counter medicines may also be recommended for pain control. Ask your caregiver what the best treatment is for your contusion. HOME CARE INSTRUCTIONS   Put ice on the injured area.  Put ice in a plastic bag.  Place a towel between your skin and the bag.  Leave the ice on for 15-20 minutes, 3-4 times a day, or as directed by your health care provider.  Only take over-the-counter or prescription medicines for pain, discomfort, or fever as directed by your caregiver. Your caregiver may recommend avoiding anti-inflammatory medicines (aspirin, ibuprofen, and naproxen) for 48 hours because these medicines may increase bruising.  Rest the injured area.  If possible, elevate the injured area to  reduce swelling. SEEK IMMEDIATE MEDICAL CARE IF:   You have increased bruising or swelling.  You have pain that is getting worse.  Your swelling or pain is not relieved with medicines. MAKE SURE YOU:   Understand these instructions.  Will watch your condition.  Will get help right away if you are not doing well or get worse. Document Released: 11/07/2004 Document Revised: 02/02/2013 Document Reviewed: 12/03/2010 [facility] Patient Information 2015 [place], [place]. This information is not intended to replace advice given to you by your health care provider. Make sure you discuss any questions you have with your health care provider.  Laceration Care, Adult A laceration is a cut or lesion that goes through all layers of the skin and into the tissue just beneath the skin. TREATMENT  Some lacerations may not require closure. Some lacerations may not be able to be closed due to an increased risk of infection. It is important to see your caregiver as soon as possible after an injury to minimize the risk of  infection and maximize the opportunity for successful closure. If closure is appropriate, pain medicines may be given, if needed. The wound will be cleaned to help prevent infection. Your caregiver will use stitches (sutures), staples, wound glue (adhesive), or skin adhesive strips to repair the laceration. These tools bring the skin edges together to allow for faster healing and a better cosmetic outcome. However, all wounds will heal with a scar. Once the wound has healed, scarring can be minimized by covering the wound with sunscreen during the day for 1 full year. HOME CARE INSTRUCTIONS  For sutures or staples:  Keep the wound clean and dry.  If you were given a bandage (dressing), you should change it at least once a day. Also, change the dressing if it becomes wet or dirty, or as directed by your caregiver.  Wash the wound with soap and water 2 times a day. Rinse the wound off with  water to remove all soap. Pat the wound dry with a clean towel.  After cleaning, apply a thin layer of the antibiotic ointment as recommended by your caregiver. This will help prevent infection and keep the dressing from sticking.  You may shower as usual after the first 24 hours. Do not soak the wound in water until the sutures are removed.  Only take over-the-counter or prescription medicines for pain, discomfort, or fever as directed by your caregiver.  Get your sutures or staples removed as directed by your caregiver. For skin adhesive strips:  Keep the wound clean and dry.  Do not get the skin adhesive strips wet. You may bathe carefully, using caution to keep the wound dry.  If the wound gets wet, pat it dry with a clean towel.  Skin adhesive strips will fall off on their own. You may trim the strips as the wound heals. Do not remove skin adhesive strips that are still stuck to the wound. They will fall off in time. For wound adhesive:  You may briefly wet your wound in the shower or bath. Do not soak or scrub the wound. Do not swim. Avoid periods of heavy perspiration until the skin adhesive has fallen off on its own. After showering or bathing, gently pat the wound dry with a clean towel.  Do not apply liquid medicine, cream medicine, or ointment medicine to your wound while the skin adhesive is in place. This may loosen the film before your wound is healed.  If a dressing is placed over the wound, be careful not to apply tape directly over the skin adhesive. This may cause the adhesive to be pulled off before the wound is healed.  Avoid prolonged exposure to sunlight or tanning lamps while the skin adhesive is in place. Exposure to ultraviolet light in the first year will darken the scar.  The skin adhesive will usually remain in place for 5 to 10 days, then naturally fall off the skin. Do not pick at the adhesive film. You may need a tetanus shot if:  You cannot remember when  you had your last tetanus shot.  You have never had a tetanus shot. If you get a tetanus shot, your arm may swell, get red, and feel warm to the touch. This is common and not a problem. If you need a tetanus shot and you choose not to have one, there is a rare chance of getting tetanus. Sickness from tetanus can be serious. SEEK MEDICAL CARE IF:   You have redness, swelling, or increasing pain in the  wound.  You see a red line that goes away from the wound.  You have yellowish-white fluid (pus) coming from the wound.  You have a fever.  You notice a bad smell coming from the wound or dressing.  Your wound breaks open before or after sutures have been removed.  You notice something coming out of the wound such as wood or glass.  Your wound is on your hand or foot and you cannot move a finger or toe. SEEK IMMEDIATE MEDICAL CARE IF:   Your pain is not controlled with prescribed medicine.  You have severe swelling around the wound causing pain and numbness or a change in color in your arm, hand, leg, or foot.  Your wound splits open and starts bleeding.  You have worsening numbness, weakness, or loss of function of any joint around or beyond the wound.  You develop painful lumps near the wound or on the skin anywhere on your body. MAKE SURE YOU:   Understand these instructions.  Will watch your condition.  Will get help right away if you are not doing well or get worse. Document Released: 01/28/2005 Document Revised: 04/22/2011 Document Reviewed: 07/24/2010 [facility] Patient Information 2015 [place], [place]. This information is not intended to replace advice given to you by your health care provider. Make sure you discuss any questions you have with your health care provider.  Tramadol tablets What is this medicine? TRAMADOL (TRA ma dole) is a pain reliever. It is used to treat moderate to severe pain in adults. This medicine may be used for other purposes; ask your health  care provider or pharmacist if you have questions. COMMON BRAND NAME(S): Ultram What should I tell my health care provider before I take this medicine? They need to know if you have any of these conditions: -brain tumor -depression -drug abuse or addiction -head injury -if you frequently drink alcohol containing drinks -kidney disease or trouble passing urine -liver disease -lung disease, asthma, or breathing problems -seizures or epilepsy -suicidal thoughts, plans, or attempt; a previous suicide attempt by you or a family member -an unusual or allergic reaction to tramadol, codeine, other medicines, foods, dyes, or preservatives -pregnant or trying to get pregnant -breast-feeding How should I use this medicine? Take this medicine by mouth with a full glass of water. Follow the directions on the prescription label. If the medicine upsets your stomach, take it with food or milk. Do not take more medicine than you are told to take. Talk to your pediatrician regarding the use of this medicine in children. Special care may be needed. Overdosage: If you think you have taken too much of this medicine contact a poison control center or emergency room at once. NOTE: This medicine is only for you. Do not share this medicine with others. What if I miss a dose? If you miss a dose, take it as soon as you can. If it is almost time for your next dose, take only that dose. Do not take double or extra doses. What may interact with this medicine? Do not take this medicine with any of the following medications: -MAOIs like Carbex, Eldepryl, Marplan, Nardil, and Parnate This medicine may also interact with the following medications: -alcohol or medicines that contain alcohol -antihistamines -benzodiazepines -bupropion -carbamazepine or oxcarbazepine -clozapine -cyclobenzaprine -digoxin -furazolidone -linezolid -medicines for depression, anxiety, or psychotic disturbances -medicines for migraine  headache like almotriptan, eletriptan, frovatriptan, naratriptan, rizatriptan, sumatriptan, zolmitriptan -medicines for pain like pentazocine, buprenorphine, butorphanol, meperidine, nalbuphine, and propoxyphene -medicines  for sleep -muscle relaxants -naltrexone -phenobarbital -phenothiazines like perphenazine, thioridazine, chlorpromazine, mesoridazine, fluphenazine, prochlorperazine, promazine, and trifluoperazine -procarbazine -warfarin This list may not describe all possible interactions. Give your health care provider a list of all the medicines, herbs, non-prescription drugs, or dietary supplements you use. Also tell them if you smoke, drink alcohol, or use illegal drugs. Some items may interact with your medicine. What should I watch for while using this medicine? Tell your doctor or health care professional if your pain does not go away, if it gets worse, or if you have new or a different type of pain. You may develop tolerance to the medicine. Tolerance means that you will need a higher dose of the medicine for pain relief. Tolerance is normal and is expected if you take this medicine for a long time. Do not suddenly stop taking your medicine because you may develop a severe reaction. Your body becomes used to the medicine. This does NOT mean you are addicted. Addiction is a behavior related to getting and using a drug for a non-medical reason. If you have pain, you have a medical reason to take pain medicine. Your doctor will tell you how much medicine to take. If your doctor wants you to stop the medicine, the dose will be slowly lowered over time to avoid any side effects. You may get drowsy or dizzy. Do not drive, use machinery, or do anything that needs mental alertness until you know how this medicine affects you. Do not stand or sit up quickly, especially if you are an older patient. This reduces the risk of dizzy or fainting spells. Alcohol can increase or decrease the effects of this  medicine. Avoid alcoholic drinks. You may have constipation. Try to have a bowel movement at least every 2 to 3 days. If you do not have a bowel movement for 3 days, call your doctor or health care professional. Your mouth may get dry. Chewing sugarless gum or sucking hard candy, and drinking plenty of water may help. Contact your doctor if the problem does not go away or is severe. What side effects may I notice from receiving this medicine? Side effects that you should report to your doctor or health care professional as soon as possible: -allergic reactions like skin rash, itching or hives, swelling of the face, lips, or tongue -breathing difficulties, wheezing -confusion -itching -light headedness or fainting spells -redness, blistering, peeling or loosening of the skin, including inside the mouth -seizures Side effects that usually do not require medical attention (report to your doctor or health care professional if they continue or are bothersome): -constipation -dizziness -drowsiness -headache -nausea, vomiting This list may not describe all possible side effects. Call your doctor for medical advice about side effects. You may report side effects to FDA at 1-800-FDA-1088. Where should I keep my medicine? Keep out of the reach of children. Store at room temperature between 15 and 30 degrees C (59 and 86 degrees F). Keep container tightly closed. Throw away any unused medicine after the expiration date. NOTE: This sheet is a summary. It may not cover all possible information. If you have questions about this medicine, talk to your doctor, pharmacist, or health care provider.  2015, [employer]. (2009-10-11 11:55:44)

## 2013-11-02 NOTE — ED Notes (Signed)
Notified Md that pt now request pain med

## 2013-11-02 NOTE — ED Notes (Signed)
Suture cart set up at bedside

## 2013-11-02 NOTE — ED Notes (Signed)
EMS - Pt coming from home after having a fall.  Pt was conscious but unaware of the situation.  Hematoma to the left temporal area.  Pt states she had a couple of drinks (B[employer]) last night.  No neurological deficits.   12 Lead is unremarkable.  Pt had walked from her apartment to her neighbors to call for help.  The blood trail started in the patients bathroom and lead to the neighbors house.  No blood thinners.

## 2013-11-02 NOTE — ED Provider Notes (Addendum)
CSN: [id number]     Arrival date & time 11/02/13  0402 History   First MD Initiated Contact with Patient 11/02/13 0411     Chief Complaint  Patient presents with  . Fall     (Consider location/radiation/quality/duration/timing/severity/associated sxs/prior Treatment) The history is provided by the patient and a friend.   68 year old female apparently fell getting out of bed going to the bathroom. She walked to a neighbors house and they did find blood on her bathroom floor. She does not remember the incident. She feels she is up-to-date on tetanus immunizations. She suffered a laceration to her for head. Neighbors states that her mental status about at her baseline. There has been no nausea or vomiting. She denies any other injury. She is not on any anticoagulants. Of note, she was treated for pneumonia about 6 weeks ago and has had ongoing problems of feeling generally weak since then.  Past Medical History  Diagnosis Date  . Pneumonia     hx of   . Hypothyroidism   . Anemia     pernicious anemia - 1991   . Chronic kidney disease     hx of interstitial cystitis   . 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 Arthritis     rheumatoid arthritis   . Depression   . Lyme disease     hx of possible   . Fibromyalgia   . Osteoporosis   . Anxiety   . Disc disorder of lumbar region 2013   Past Surgical History  Procedure Laterality Date  . Bladder hydrodistention  1998  . Lumbar laminectomy/decompression microdiscectomy  06/10/2011    Procedure: LUMBAR LAMINECTOMY/DECOMPRESSION MICRODISCECTOMY 1 LEVEL;  Surgeon: Hosie Spangle, MD;  Location: [facility];  Service: Neurosurgery;  Laterality: Left;  Lumbar Five Sacral One Lumbar Laminectomy Decompression Microdiscectomy   . Breast surgery      augment  . Appendectomy  1978   Family History  Problem Relation Age of Onset  . Cancer Mother   . Breast cancer Mother   . Colon cancer Mother   .  Pancreatic cancer Mother   . Cancer Maternal Grandmother    History  Substance Use Topics  . Smoking status: Former Smoker    Quit date: 02/11/1990  . Smokeless tobacco: Never Used  . Alcohol Use: 4.8 oz/week    8 Glasses of wine per week   OB History   Grav Para Term Preterm Abortions TAB SAB Ect Mult Living                 Review of Systems  All other systems reviewed and are negative.     Allergies  Amoxicillin-pot clavulanate; Demerol; and Sulfa antibiotics  Home Medications   Prior to Admission medications   Medication Sig Start Date End Date Taking? Authorizing Provider  acetaminophen (TYLENOL) 500 MG tablet Take 1,000 mg by mouth every 6 (six) hours as needed. For pain    Historical Provider, MD  albuterol (PROVENTIL HFA;VENTOLIN HFA) 108 (90 BASE) MCG/ACT inhaler Inhale 2 puffs into the lungs every 6 (six) hours as needed. For shortness of breath    Historical Provider, MD  ALPRAZolam (XANAX) 0.25 MG tablet Take 0.25 mg by mouth 3 (three) times daily as needed. For anxiety    Historical Provider, MD  azithromycin (ZITHROMAX) 250 MG tablet Take 2 pills by mouth on day 1, then 1 pill by mouth per day on days  2 through 5. 09/19/13   Wendie Agreste, MD  benzonatate (TESSALON) 100 MG capsule Take 100 mg by mouth 3 (three) times daily as needed. For cough    Historical Provider, MD  calcium carbonate (OS-CAL - DOSED IN MG OF ELEMENTAL CALCIUM) 1250 MG tablet Take 1 tablet by mouth daily.    Historical Provider, MD  FLUoxetine (PROZAC) 20 MG capsule Take 20 mg by mouth 2 (two) times daily.    Historical Provider, MD  guaiFENesin (MUCINEX) 600 MG 12 hr tablet Take 1,200 mg by mouth 2 (two) times daily.    Historical Provider, MD  levothyroxine (SYNTHROID, LEVOTHROID) 125 MCG tablet Take 125 mcg by mouth daily with breakfast.    Historical Provider, MD  omeprazole (PRILOSEC) 20 MG capsule Take 20 mg by mouth daily as needed. For indigestion    Historical Provider, MD  Vitamin D,  Ergocalciferol, (DRISDOL) 50000 UNITS CAPS Take 50,000 Units by mouth every 7 (seven) days. On Mondays    Historical Provider, MD   BP 156/67  Temp(Src) 98.1 F (36.7 C) (Oral)  Resp 22  Wt 104 lb (47.174 kg)  SpO2 95% Physical Exam  Nursing note and vitals reviewed.  68 year old female, resting comfortably and in no acute distress. Vital signs are significant for hypertension and tachypnea. Oxygen saturation is 95%, which is normal. Head is normocephalic. PERRLA, EOMI. Oropharynx is clear. Stellate laceration is present over the onset of the forehead. 4 head hematoma is present. Neck is immobilized in a stiff cervical collar and is mildly tender in the midline. There is no adenopathy or JVD. Back is nontender and there is no CVA tenderness. Lungs are clear without rales, wheezes, or rhonchi. Chest is nontender. Heart has regular rate and rhythm without murmur. Abdomen is soft, flat, nontender without masses or hepatosplenomegaly and peristalsis is normoactive. Extremities have no cyanosis or edema, full range of motion is present. Skin is warm and dry without rash. Neurologic: Mental status is normal, cranial nerves are intact, there are no motor or sensory deficits.  ED Course  Procedures (including critical care time) LACERATION REPAIR Performed by: VWUJW,JXBJY Authorized by: NWGNF,AOZHY Consent: Verbal consent obtained. Risks and benefits: risks, benefits and alternatives were discussed Consent given by: patient Patient identity confirmed: provided demographic data Prepped and Draped in normal sterile fashion Wound explored  Laceration Location: forehead Laceration Length: 2.5 cm  No Foreign Bodies seen or palpated  Anesthesia: local infiltration  Local anesthetic: lidocaine 2% with epinephrine  Anesthetic total: 3 ml  Amount of cleaning: standard  Skin closure: close  Number of sutures: 5 5-0 Prolene  Technique: simple interrupted  Patient tolerance: Patient  tolerated the procedure well with no immediate complications.  Labs Review Results for orders placed during the hospital encounter of 11/02/13  CBC WITH DIFFERENTIAL      Result Value Ref Range   WBC 7.5  4.0 - 10.5 K/uL   RBC 4.16  3.87 - 5.11 MIL/uL   Hemoglobin 14.0  12.0 - 15.0 g/dL   HCT 41.0  36.0 - 46.0 %   MCV 98.6  78.0 - 100.0 fL   MCH 33.7  26.0 - 34.0 pg   MCHC 34.1  30.0 - 36.0 g/dL   RDW 13.1  11.5 - 15.5 %   Platelets 310  150 - 400 K/uL   Neutrophils Relative % 67  43 - 77 %   Neutro Abs 5.1  1.7 - 7.7 K/uL   Lymphocytes Relative 25  12 -  46 %   Lymphs Abs 1.9  0.7 - 4.0 K/uL   Monocytes Relative 7  3 - 12 %   Monocytes Absolute 0.5  0.1 - 1.0 K/uL   Eosinophils Relative 1  0 - 5 %   Eosinophils Absolute 0.1  0.0 - 0.7 K/uL   Basophils Relative 0  0 - 1 %   Basophils Absolute 0.0  0.0 - 0.1 K/uL  COMPREHENSIVE METABOLIC PANEL      Result Value Ref Range   Sodium 139  137 - 147 mEq/L   Potassium 3.4 (*) 3.7 - 5.3 mEq/L   Chloride 99  96 - 112 mEq/L   CO2 22  19 - 32 mEq/L   Glucose, Bld 115 (*) 70 - 99 mg/dL   BUN 10  6 - 23 mg/dL   Creatinine, Ser 0.69  0.50 - 1.10 mg/dL   Calcium 9.1  8.4 - 10.5 mg/dL   Total Protein 6.9  6.0 - 8.3 g/dL   Albumin 3.8  3.5 - 5.2 g/dL   AST 18  0 - 37 U/L   ALT 10  0 - 35 U/L   Alkaline Phosphatase 59  39 - 117 U/L   Total Bilirubin <0.2 (*) 0.3 - 1.2 mg/dL   GFR calc non Af Amer 89 (*) >90 mL/min   GFR calc Af Amer >90  >90 mL/min   Anion gap 18 (*) 5 - 15   Imaging Review Ct Head Wo Contrast  11/02/2013   CLINICAL DATA:  Fall  EXAM: CT HEAD WITHOUT CONTRAST  CT CERVICAL SPINE WITHOUT CONTRAST  TECHNIQUE: Multidetector CT imaging of the head and cervical spine was performed following the standard protocol without intravenous contrast. Multiplanar CT image reconstructions of the cervical spine were also generated.  COMPARISON:  Prior study from 02/13/2012  FINDINGS: CT HEAD FINDINGS  There is no acute intracranial  hemorrhage or infarct. No mass lesion or midline shift. Gray-white matter differentiation is well maintained. Ventricles are normal in size without evidence of hydrocephalus. CSF containing spaces are within normal limits. No extra-axial fluid collection.  The calvarium is intact.  Orbital soft tissues are within normal limits.  The paranasal sinuses and mastoid air cells are well pneumatized and free of fluid.  Left forehead contusion present.  CT CERVICAL SPINE FINDINGS  Trace anterolisthesis of C3 on C4 and C4 on C5 is present, likely chronic in nature. Vertebral bodies are otherwise normally aligned. Vertebral body heights are preserved. Normal C1-2 articulations are intact. No prevertebral soft tissue swelling. No acute fracture or listhesis.  Mild to moderate multilevel degenerative disc disease present as evidenced by intervertebral disc space narrowing and endplate osteophytosis. Multilevel facet arthropathy present throughout the cervical spine, most prevalent at the C3-4 level bilaterally.  Visualized soft tissues of the neck are within normal limits. Visualized lung apices are clear without evidence of apical pneumothorax.  IMPRESSION: CT BRAIN:  1. No acute intracranial process. 2. Left forehead contusion.  CT CERVICAL SPINE:  No acute traumatic injury within the cervical spine.   Electronically Signed   By: Jeannine Boga M.D.   On: 11/02/2013 05:39   Ct Cervical Spine Wo Contrast  11/02/2013   CLINICAL DATA:  Fall  EXAM: CT HEAD WITHOUT CONTRAST  CT CERVICAL SPINE WITHOUT CONTRAST  TECHNIQUE: Multidetector CT imaging of the head and cervical spine was performed following the standard protocol without intravenous contrast. Multiplanar CT image reconstructions of the cervical spine were also generated.  COMPARISON:  Prior  study from 02/13/2012  FINDINGS: CT HEAD FINDINGS  There is no acute intracranial hemorrhage or infarct. No mass lesion or midline shift. Gray-white matter differentiation is  well maintained. Ventricles are normal in size without evidence of hydrocephalus. CSF containing spaces are within normal limits. No extra-axial fluid collection.  The calvarium is intact.  Orbital soft tissues are within normal limits.  The paranasal sinuses and mastoid air cells are well pneumatized and free of fluid.  Left forehead contusion present.  CT CERVICAL SPINE FINDINGS  Trace anterolisthesis of C3 on C4 and C4 on C5 is present, likely chronic in nature. Vertebral bodies are otherwise normally aligned. Vertebral body heights are preserved. Normal C1-2 articulations are intact. No prevertebral soft tissue swelling. No acute fracture or listhesis.  Mild to moderate multilevel degenerative disc disease present as evidenced by intervertebral disc space narrowing and endplate osteophytosis. Multilevel facet arthropathy present throughout the cervical spine, most prevalent at the C3-4 level bilaterally.  Visualized soft tissues of the neck are within normal limits. Visualized lung apices are clear without evidence of apical pneumothorax.  IMPRESSION: CT BRAIN:  1. No acute intracranial process. 2. Left forehead contusion.  CT CERVICAL SPINE:  No acute traumatic injury within the cervical spine.   Electronically Signed   By: Jeannine Boga M.D.   On: 11/02/2013 05:39    MDM   Final diagnoses:  Fall at home, initial encounter  Laceration of forehead, initial encounter  Contusion of forehead, initial encounter    Fall with forehead laceration. She will be sent for CT of head and cervical spine. Laceration will need suture repair.  CT shows no acute injury. She is discharged with prescription for tramadol for pain in his shoulder sutures removed in 4-5 days.  Delora Fuel, MD 45/62/56 [id number]  Delora Fuel, MD 73/42/87 [id number]

## 2013-11-02 NOTE — ED Notes (Signed)
MD at bedside. 

## 2013-11-08 DIAGNOSIS — IMO0002 Reserved for concepts with insufficient information to code with codable children: Secondary | ICD-10-CM | POA: Diagnosis not present

## 2013-11-08 DIAGNOSIS — D510 Vitamin B12 deficiency anemia due to intrinsic factor deficiency: Secondary | ICD-10-CM | POA: Diagnosis not present

## 2013-11-08 DIAGNOSIS — S0180XA Unspecified open wound of other part of head, initial encounter: Secondary | ICD-10-CM | POA: Diagnosis not present

## 2013-11-08 DIAGNOSIS — Z4802 Encounter for removal of sutures: Secondary | ICD-10-CM | POA: Diagnosis not present

## 2013-12-05 DIAGNOSIS — J209 Acute bronchitis, unspecified: Secondary | ICD-10-CM | POA: Diagnosis not present

## 2013-12-24 DIAGNOSIS — H2513 Age-related nuclear cataract, bilateral: Secondary | ICD-10-CM | POA: Diagnosis not present

## 2014-01-01 DIAGNOSIS — Z23 Encounter for immunization: Secondary | ICD-10-CM | POA: Diagnosis not present

## 2014-01-26 DIAGNOSIS — H2513 Age-related nuclear cataract, bilateral: Secondary | ICD-10-CM | POA: Diagnosis not present

## 2014-02-17 DIAGNOSIS — Z881 Allergy status to other antibiotic agents status: Secondary | ICD-10-CM | POA: Diagnosis not present

## 2014-02-17 DIAGNOSIS — E079 Disorder of thyroid, unspecified: Secondary | ICD-10-CM | POA: Diagnosis not present

## 2014-02-17 DIAGNOSIS — H2512 Age-related nuclear cataract, left eye: Secondary | ICD-10-CM | POA: Diagnosis not present

## 2014-02-17 DIAGNOSIS — Z885 Allergy status to narcotic agent status: Secondary | ICD-10-CM | POA: Diagnosis not present

## 2014-02-17 DIAGNOSIS — Z87891 Personal history of nicotine dependence: Secondary | ICD-10-CM | POA: Diagnosis not present

## 2014-02-17 DIAGNOSIS — Z79899 Other long term (current) drug therapy: Secondary | ICD-10-CM | POA: Diagnosis not present

## 2014-02-17 DIAGNOSIS — Z880 Allergy status to penicillin: Secondary | ICD-10-CM | POA: Diagnosis not present

## 2014-02-17 DIAGNOSIS — Z882 Allergy status to sulfonamides status: Secondary | ICD-10-CM | POA: Diagnosis not present

## 2014-03-03 DIAGNOSIS — D510 Vitamin B12 deficiency anemia due to intrinsic factor deficiency: Secondary | ICD-10-CM | POA: Diagnosis not present

## 2014-03-17 DIAGNOSIS — Z79899 Other long term (current) drug therapy: Secondary | ICD-10-CM | POA: Diagnosis not present

## 2014-03-17 DIAGNOSIS — Z882 Allergy status to sulfonamides status: Secondary | ICD-10-CM | POA: Diagnosis not present

## 2014-03-17 DIAGNOSIS — Z87891 Personal history of nicotine dependence: Secondary | ICD-10-CM | POA: Diagnosis not present

## 2014-03-17 DIAGNOSIS — Z9842 Cataract extraction status, left eye: Secondary | ICD-10-CM | POA: Diagnosis not present

## 2014-03-17 DIAGNOSIS — Z880 Allergy status to penicillin: Secondary | ICD-10-CM | POA: Diagnosis not present

## 2014-03-17 DIAGNOSIS — Z961 Presence of intraocular lens: Secondary | ICD-10-CM | POA: Diagnosis not present

## 2014-03-17 DIAGNOSIS — E039 Hypothyroidism, unspecified: Secondary | ICD-10-CM | POA: Diagnosis not present

## 2014-03-17 DIAGNOSIS — H2511 Age-related nuclear cataract, right eye: Secondary | ICD-10-CM | POA: Diagnosis not present

## 2014-04-28 DIAGNOSIS — H43812 Vitreous degeneration, left eye: Secondary | ICD-10-CM | POA: Diagnosis not present

## 2014-05-10 DIAGNOSIS — N3010 Interstitial cystitis (chronic) without hematuria: Secondary | ICD-10-CM | POA: Diagnosis not present

## 2014-05-10 DIAGNOSIS — R399 Unspecified symptoms and signs involving the genitourinary system: Secondary | ICD-10-CM | POA: Diagnosis not present

## 2014-05-10 DIAGNOSIS — M792 Neuralgia and neuritis, unspecified: Secondary | ICD-10-CM | POA: Diagnosis not present

## 2014-06-15 DIAGNOSIS — M810 Age-related osteoporosis without current pathological fracture: Secondary | ICD-10-CM | POA: Diagnosis not present

## 2014-06-15 DIAGNOSIS — D649 Anemia, unspecified: Secondary | ICD-10-CM | POA: Diagnosis not present

## 2014-06-15 DIAGNOSIS — E559 Vitamin D deficiency, unspecified: Secondary | ICD-10-CM | POA: Diagnosis not present

## 2014-07-12 DIAGNOSIS — N3010 Interstitial cystitis (chronic) without hematuria: Secondary | ICD-10-CM | POA: Diagnosis not present

## 2014-08-11 DIAGNOSIS — Z1231 Encounter for screening mammogram for malignant neoplasm of breast: Secondary | ICD-10-CM | POA: Diagnosis not present

## 2014-08-11 DIAGNOSIS — Z803 Family history of malignant neoplasm of breast: Secondary | ICD-10-CM | POA: Diagnosis not present

## 2014-10-10 DIAGNOSIS — M25511 Pain in right shoulder: Secondary | ICD-10-CM | POA: Diagnosis not present

## 2014-10-13 DIAGNOSIS — M25511 Pain in right shoulder: Secondary | ICD-10-CM | POA: Diagnosis not present

## 2014-11-06 ENCOUNTER — Encounter (HOSPITAL_COMMUNITY): Payer: Self-pay | Admitting: Emergency Medicine

## 2014-11-06 ENCOUNTER — Emergency Department (HOSPITAL_COMMUNITY): Payer: Medicare Other

## 2014-11-06 ENCOUNTER — Emergency Department (HOSPITAL_COMMUNITY)
Admission: EM | Admit: 2014-11-06 | Discharge: 2014-11-06 | Disposition: A | Discharge status: Home / Self Care | Payer: Medicare Other | Attending: Emergency Medicine | Admitting: Emergency Medicine

## 2014-11-06 ENCOUNTER — Emergency Department (INDEPENDENT_AMBULATORY_CARE_PROVIDER_SITE_OTHER)
Admission: EM | Admit: 2014-11-06 | Discharge: 2014-11-06 | Disposition: A | Discharge status: Nursing Home (Includes ICF) | Payer: Medicare Other | Source: Home / Self Care | Attending: Emergency Medicine | Admitting: Emergency Medicine

## 2014-11-06 DIAGNOSIS — M1990 Unspecified osteoarthritis, unspecified site: Secondary | ICD-10-CM | POA: Insufficient documentation

## 2014-11-06 DIAGNOSIS — M797 Fibromyalgia: Secondary | ICD-10-CM | POA: Insufficient documentation

## 2014-11-06 DIAGNOSIS — Z880 Allergy status to penicillin: Secondary | ICD-10-CM | POA: Insufficient documentation

## 2014-11-06 DIAGNOSIS — Z862 Personal history of diseases of the blood and blood-forming organs and certain disorders involving the immune mechanism: Secondary | ICD-10-CM | POA: Diagnosis not present

## 2014-11-06 DIAGNOSIS — Z79899 Other long term (current) drug therapy: Secondary | ICD-10-CM | POA: Insufficient documentation

## 2014-11-06 DIAGNOSIS — Z8701 Personal history of pneumonia (recurrent): Secondary | ICD-10-CM | POA: Diagnosis not present

## 2014-11-06 DIAGNOSIS — R079 Chest pain, unspecified: Secondary | ICD-10-CM | POA: Diagnosis not present

## 2014-11-06 DIAGNOSIS — E039 Hypothyroidism, unspecified: Secondary | ICD-10-CM | POA: Insufficient documentation

## 2014-11-06 DIAGNOSIS — Z87891 Personal history of nicotine dependence: Secondary | ICD-10-CM | POA: Insufficient documentation

## 2014-11-06 DIAGNOSIS — F419 Anxiety disorder, unspecified: Secondary | ICD-10-CM | POA: Diagnosis not present

## 2014-11-06 DIAGNOSIS — N189 Chronic kidney disease, unspecified: Secondary | ICD-10-CM | POA: Diagnosis not present

## 2014-11-06 DIAGNOSIS — R0789 Other chest pain: Secondary | ICD-10-CM | POA: Diagnosis not present

## 2014-11-06 DIAGNOSIS — F329 Major depressive disorder, single episode, unspecified: Secondary | ICD-10-CM | POA: Insufficient documentation

## 2014-11-06 LAB — BASIC METABOLIC PANEL
Anion gap: 13 (ref 5–15)
BUN: 10 mg/dL (ref 6–20)
CHLORIDE: 99 mmol/L — AB (ref 101–111)
CO2: 27 mmol/L (ref 22–32)
CREATININE: 0.77 mg/dL (ref 0.44–1.00)
Calcium: 9.7 mg/dL (ref 8.9–10.3)
GFR calc Af Amer: >60 mL/min (ref >60)
GFR calc non Af Amer: >60 mL/min (ref >60)
GLUCOSE: 87 mg/dL (ref 65–99)
POTASSIUM: 4.1 mmol/L (ref 3.5–5.1)
SODIUM: 139 mmol/L (ref 135–145)

## 2014-11-06 LAB — CBC
HEMATOCRIT: 41.1 % (ref 36.0–46.0)
Hemoglobin: 13.7 g/dL (ref 12.0–15.0)
MCH: 34 pg (ref 26.0–34.0)
MCHC: 33.3 g/dL (ref 30.0–36.0)
MCV: 102 fL — AB (ref 78.0–100.0)
PLATELETS: 305 10*3/uL (ref 150–400)
RBC: 4.03 MIL/uL (ref 3.87–5.11)
RDW: 12.8 % (ref 11.5–15.5)
WBC: 8.5 10*3/uL (ref 4.0–10.5)

## 2014-11-06 LAB — I-STAT TROPONIN, ED: Troponin i, poc: 0 ng/mL (ref 0.00–0.08)

## 2014-11-06 NOTE — ED Provider Notes (Signed)
CSN: [id number]     Arrival date & time 11/06/14  1950 History   First MD Initiated Contact with Patient 11/06/14 2025     Chief Complaint  Patient presents with  . Chest Pain   (Consider location/radiation/quality/duration/timing/severity/associated sxs/prior Treatment) HPI Comments: 69 year old female presents to the urgent care stating that she has not felt well in general today. While driving around 1:19 PM she experienced pressure in the chest that radiated to the back. She states the pressure is located in the center of the chest. She has had no shortness of breath, no fever, no nausea or vomiting or diaphoresis. She is anxious and attributed the discomfort to anxiety. She has no CAD history. No history of embolic disease.  Patient is a 69 y.o. female presenting with chest pain. The history is provided by the patient.  Chest Pain Pain location:  Substernal area Pain quality: pressure   Pain radiates to:  Mid back Pain radiates to the back: yes   Pain severity:  Mild Onset quality:  Sudden Timing:  Constant Progression:  Partially resolved Chronicity:  New Context: at rest and stress   Context: not breathing, not eating, not lifting, no movement, not raising an arm and no trauma   Associated symptoms: anxiety and back pain   Associated symptoms: no abdominal pain, no altered mental status, no cough, no fatigue, no fever, no lower extremity edema, no nausea, no palpitations, no shortness of breath, not vomiting and no weakness   Risk factors: no aortic disease, no coronary artery disease, no diabetes mellitus, no hypertension and not obese     Past Medical History  Diagnosis Date  . Pneumonia     hx of   . Hypothyroidism   . Anemia     pernicious anemia - 1991   . Chronic kidney disease     hx of interstitial cystitis   . 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 Arthritis     rheumatoid arthritis   . Depression   . Lyme disease     hx of possible   . Fibromyalgia   . Osteoporosis   . Anxiety   . Disc disorder of lumbar region 2013   Past Surgical History  Procedure Laterality Date  . Bladder hydrodistention  1998  . Lumbar laminectomy/decompression microdiscectomy  06/10/2011    Procedure: LUMBAR LAMINECTOMY/DECOMPRESSION MICRODISCECTOMY 1 LEVEL;  Surgeon: Hosie Spangle, MD;  Location: [facility];  Service: Neurosurgery;  Laterality: Left;  Lumbar Five Sacral One Lumbar Laminectomy Decompression Microdiscectomy   . Breast surgery      augment  . Appendectomy  1978   Family History  Problem Relation Age of Onset  . Cancer Mother   . Breast cancer Mother   . Colon cancer Mother   . Pancreatic cancer Mother   . Cancer Maternal Grandmother    Social History  Substance Use Topics  . Smoking status: Former Smoker    Quit date: 02/11/1990  . Smokeless tobacco: Never Used  . Alcohol Use: 4.8 oz/week    8 Glasses of wine per week     Comment: 2-3oz every night   OB History    No data available     Review of Systems  Constitutional: Negative for fever, activity change and fatigue.  HENT: Negative.   Respiratory: Negative for cough and shortness of breath.   Cardiovascular: Positive for chest pain. Negative for palpitations and leg swelling.  Gastrointestinal: Negative.  Negative for nausea, vomiting and abdominal pain.  Genitourinary: Negative.   Musculoskeletal: Positive for back pain.  Skin: Negative.   Neurological: Negative.  Negative for weakness.  Psychiatric/Behavioral: Negative.     Allergies  Amoxicillin-pot clavulanate; Demerol; and Sulfa antibiotics  Home Medications   Prior to Admission medications   Medication Sig Start Date End Date Taking? Authorizing Provider  FLUoxetine (PROZAC) 20 MG capsule Take 20 mg by mouth 2 (two) times daily.   Yes Historical Provider, MD  levothyroxine (SYNTHROID, LEVOTHROID) 125 MCG tablet Take 125 mcg by mouth daily with breakfast.   Yes Historical  Provider, MD  acetaminophen (TYLENOL) 500 MG tablet Take 1,000 mg by mouth every 6 (six) hours as needed. For pain    Historical Provider, MD  albuterol (PROVENTIL HFA;VENTOLIN HFA) 108 (90 BASE) MCG/ACT inhaler Inhale 2 puffs into the lungs every 6 (six) hours as needed. For shortness of breath    Historical Provider, MD  ALPRAZolam (XANAX) 0.25 MG tablet Take 0.25 mg by mouth 3 (three) times daily as needed. For anxiety    Historical Provider, MD  azithromycin (ZITHROMAX) 250 MG tablet Take 2 pills by mouth on day 1, then 1 pill by mouth per day on days 2 through 5. 09/19/13   Wendie Agreste, MD  benzonatate (TESSALON) 100 MG capsule Take 100 mg by mouth 3 (three) times daily as needed. For cough    Historical Provider, MD  calcium carbonate (OS-CAL - DOSED IN MG OF ELEMENTAL CALCIUM) 1250 MG tablet Take 1 tablet by mouth daily.    Historical Provider, MD  guaiFENesin (MUCINEX) 600 MG 12 hr tablet Take 1,200 mg by mouth 2 (two) times daily.    Historical Provider, MD  omeprazole (PRILOSEC) 20 MG capsule Take 20 mg by mouth daily as needed. For indigestion    Historical Provider, MD  traMADol (ULTRAM) 50 MG tablet Take 1 tablet (50 mg total) by mouth every 6 (six) hours as needed. 8/85/02   Delora Fuel, MD  Vitamin D, Ergocalciferol, (DRISDOL) 50000 UNITS CAPS Take 50,000 Units by mouth every 7 (seven) days. On Mondays    Historical Provider, MD   Meds Ordered and Administered this Visit  Medications - No data to display  BP 191/72 mmHg  Pulse 93  Temp(Src) 98.3 F (36.8 C) (Oral)  Resp 16  SpO2 96% No data found.   Physical Exam  Constitutional: She is oriented to person, place, and time. She appears well-developed and well-nourished. No distress.  Eyes: Conjunctivae and EOM are normal.  Neck: Normal range of motion. Neck supple.  Cardiovascular: Normal rate, regular rhythm, normal heart sounds and intact distal pulses.   Pulmonary/Chest: Effort normal and breath sounds normal. No  respiratory distress. She has no wheezes. She has no rales. She exhibits no tenderness.  Abdominal: Soft. There is no tenderness.  Musculoskeletal: She exhibits no edema or tenderness.  Neurological: She is alert and oriented to person, place, and time. She exhibits normal muscle tone.  Skin: Skin is warm and dry. No erythema.  Psychiatric: Her mood appears anxious.  Nursing note and vitals reviewed.   ED Course  Procedures (including critical care time)  Labs Review Labs Reviewed - No data to display  Imaging Review No results found. ED ECG REPORT   Date: 11/06/2014  Rate: 86  Rhythm: normal sinus rhythm  QRS Axis: normal  Intervals: normal  ST/T Wave abnormalities: normal  Conduction Disutrbances:none  Narrative Interpretation:   Old EKG Reviewed:  none available  I have personally reviewed the EKG tracing and agree with the computerized printout as noted.   Visual Acuity Review  Right Eye Distance:   Left Eye Distance:   Bilateral Distance:    Right Eye Near:   Left Eye Near:    Bilateral Near:         MDM   1. Chest pain, unspecified chest pain type    Patient is alert and stable. She is obviously anxious on arrival to the urgent care. She appears generally well. EKG is normal sinus rhythm without evidence of ischemia and considered normal. She has no history of CAD or thromboembolic disease. Due to her age and the history of precordial chest pressure radiating to the back she will be transferred to the emergency department via shuttle.    Janne Napoleon, NP 11/06/14 2046

## 2014-11-06 NOTE — Discharge Instructions (Signed)

## 2014-11-06 NOTE — ED Notes (Signed)
Pt. transferred from [facility] reports central chest tightness/pressure radiating to mid back onset this afternoon , denies nausea or diaphoresis , no SOB or cough .

## 2014-11-06 NOTE — ED Notes (Signed)
Reports chest pain that started around 5:00 pm.  Patient was driving when pain started.  Patient reports no vomiting, patient says no sob.  Patient has minimal nausea.  Pain in chest and through to back.  Reports arms feel heavy.  Reports discomfort reminds her of anxiety

## 2014-11-06 NOTE — ED Notes (Signed)
Discharge instructions reviewed, voiced understanding.  

## 2014-11-06 NOTE — ED Notes (Signed)
Patient states earlier today she ate a fried chicken wing at her daughters house.  About 30 mins after eating the chicken wing she started with epigastric discomfort.  Stated it continued for about 2 hours and then she decided to go to Urgent Care.  Patient admits to having to take Zantac off and on and sometimes for about a month at a time.  States she gets a "burning feeling" when she has to take the Zantac.  Denies any pain or discomfort at this time.

## 2014-11-06 NOTE — ED Provider Notes (Signed)
CSN: [id number]     Arrival date & time 11/06/14  2109 History   First MD Initiated Contact with Patient 11/06/14 2124     Chief Complaint  Patient presents with  . Chest Pain     (Consider location/radiation/quality/duration/timing/severity/associated sxs/prior Treatment) HPI   Eleisha Branscomb Maslowski is a 69 y.o. female who presents for evaluation of a chest tightness which occurred, shortly after she ate a chicken wing. A tightness feels "heavy". Tightness was originally 5/10, and spontaneously improved now to 3/10. There is no associated diaphoresis, nausea, vomiting, shortness of breath, weakness or dizziness. She was evaluated briefly at the urgent care center, then sent here for further evaluation. No similar problem in the past. No typical problems with eating foods. There are no other known modifying factors.   Past Medical History  Diagnosis Date  . Pneumonia     hx of   . Hypothyroidism   . Anemia     pernicious anemia - 1991   . Chronic kidney disease     hx of interstitial cystitis   . 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 Arthritis     rheumatoid arthritis   . Depression   . Lyme disease     hx of possible   . Fibromyalgia   . Osteoporosis   . Anxiety   . Disc disorder of lumbar region 2013   Past Surgical History  Procedure Laterality Date  . Bladder hydrodistention  1998  . Lumbar laminectomy/decompression microdiscectomy  06/10/2011    Procedure: LUMBAR LAMINECTOMY/DECOMPRESSION MICRODISCECTOMY 1 LEVEL;  Surgeon: Hosie Spangle, MD;  Location: [facility];  Service: Neurosurgery;  Laterality: Left;  Lumbar Five Sacral One Lumbar Laminectomy Decompression Microdiscectomy   . Breast surgery      augment  . Appendectomy  1978   Family History  Problem Relation Age of Onset  . Cancer Mother   . Breast cancer Mother   . Colon cancer Mother   . Pancreatic cancer Mother   . Cancer Maternal Grandmother    Social  History  Substance Use Topics  . Smoking status: Former Smoker    Quit date: 02/11/1990  . Smokeless tobacco: Never Used  . Alcohol Use: Yes   OB History    No data available     Review of Systems  All other systems reviewed and are negative.     Allergies  Amoxicillin-pot clavulanate; Demerol; and Sulfa antibiotics  Home Medications   Prior to Admission medications   Medication Sig Start Date End Date Taking? Authorizing Elizah Lydon  acetaminophen (TYLENOL) 500 MG tablet Take 1,000 mg by mouth every 6 (six) hours as needed. For pain    Historical Dama Hedgepeth, MD  albuterol (PROVENTIL HFA;VENTOLIN HFA) 108 (90 BASE) MCG/ACT inhaler Inhale 2 puffs into the lungs every 6 (six) hours as needed. For shortness of breath    Historical Kirtan Sada, MD  ALPRAZolam (XANAX) 0.25 MG tablet Take 0.25 mg by mouth 3 (three) times daily as needed. For anxiety    Historical Dajia Gunnels, MD  azithromycin (ZITHROMAX) 250 MG tablet Take 2 pills by mouth on day 1, then 1 pill by mouth per day on days 2 through 5. 09/19/13   Wendie Agreste, MD  benzonatate (TESSALON) 100 MG capsule Take 100 mg by mouth 3 (three) times daily as needed. For cough    Historical Lania Zawistowski, MD  calcium carbonate (OS-CAL - DOSED IN MG OF ELEMENTAL CALCIUM) 1250  MG tablet Take 1 tablet by mouth daily.    Historical Moriyah Byington, MD  FLUoxetine (PROZAC) 20 MG capsule Take 20 mg by mouth 2 (two) times daily.    Historical Lakoda Mcanany, MD  guaiFENesin (MUCINEX) 600 MG 12 hr tablet Take 1,200 mg by mouth 2 (two) times daily.    Historical Ameyah Bangura, MD  levothyroxine (SYNTHROID, LEVOTHROID) 125 MCG tablet Take 125 mcg by mouth daily with breakfast.    Historical Gentle Hoge, MD  omeprazole (PRILOSEC) 20 MG capsule Take 20 mg by mouth daily as needed. For indigestion    Historical Perle Gibbon, MD  traMADol (ULTRAM) 50 MG tablet Take 1 tablet (50 mg total) by mouth every 6 (six) hours as needed. 2/67/12   Delora Fuel, MD  Vitamin D, Ergocalciferol, (DRISDOL)  50000 UNITS CAPS Take 50,000 Units by mouth every 7 (seven) days. On Mondays    Historical Jurnei Latini, MD   BP 151/67 mmHg  Pulse 78  Temp(Src) 98.5 F (36.9 C) (Oral)  Resp 19  Ht 5\' 1"  (1.549 m)  Wt 105 lb (47.628 kg)  BMI 19.85 kg/m2  SpO2 96% Physical Exam  Constitutional: She is oriented to person, place, and time. She appears well-developed and well-nourished. No distress.  HENT:  Head: Normocephalic and atraumatic.  Right Ear: External ear normal.  Left Ear: External ear normal.  Eyes: Conjunctivae and EOM are normal. Pupils are equal, round, and reactive to light.  Neck: Normal range of motion and phonation normal. Neck supple.  Cardiovascular: Normal rate, regular rhythm and normal heart sounds.   Pulmonary/Chest: Effort normal and breath sounds normal. No respiratory distress. She has no wheezes. She exhibits no tenderness and no bony tenderness.  Abdominal: Soft. There is no tenderness.  Musculoskeletal: Normal range of motion.  Neurological: She is alert and oriented to person, place, and time. No cranial nerve deficit or sensory deficit. She exhibits normal muscle tone. Coordination normal.  Skin: Skin is warm, dry and intact.  Psychiatric: She has a normal mood and affect. Her behavior is normal. Judgment and thought content normal.  Nursing note and vitals reviewed.   ED Course  Procedures (including critical care time) Medications - No data to display  Patient Vitals for the past 24 hrs:  BP Temp Temp src Pulse Resp SpO2 Height Weight  11/06/14 2230 151/67 mmHg - - 78 19 96 % - -  11/06/14 2215 163/77 mmHg - - 77 19 98 % - -  11/06/14 2116 169/88 mmHg 98.5 F (36.9 C) Oral 82 16 94 % 5\' 1"  (1.549 m) 105 lb (47.628 kg)   Heart score is 3, by the Heart Pathway. Less than 1% risk of major cardiac event within the first 30 days.  11:06 PM Reevaluation with update and discussion. After initial assessment and treatment, an updated evaluation reveals her chest  tightness has almost completely resolved. Findings discussed with the patient, all questions were answered. WENTZ,ELLIOTT L     Labs Review Labs Reviewed  BASIC METABOLIC PANEL - Abnormal; Notable for the following:    Chloride 99 (*)    All other components within normal limits  CBC - Abnormal; Notable for the following:    MCV 102.0 (*)    All other components within normal limits  I-STAT TROPOININ, ED    Imaging Review Dg Chest 2 View  11/06/2014   CLINICAL DATA:  Acute onset of chest pressure radiating to the back earlier today.  EXAM: CHEST  2 VIEW  COMPARISON:  09/19/2013  FINDINGS: The  heart size is normal. Mild tortuosity of the thoracic aorta, unchanged. An azygos fissure is noted. Pulmonary vasculature is normal. No consolidation, pleural effusion, or pneumothorax. No acute osseous abnormalities are seen. There is a remote left posterior rib fracture. Chronic compression deformity at the thoracolumbar junction, unchanged.  IMPRESSION: No acute pulmonary process.   Electronically Signed   By: Jeb Levering M.D.   On: 11/06/2014 22:16   I have personally reviewed and evaluated these images and lab results as part of my medical decision-making.   EKG Interpretation   Date/Time:  Sunday November 06 2014 21:16:55 EDT Ventricular Rate:  79 PR Interval:  144 QRS Duration: 78 QT Interval:  384 QTC Calculation: 440 R Axis:   -71 Text Interpretation:  Normal sinus rhythm Indeterminate axis Borderline  ECG since last tracing no significant change Confirmed by Eulis Foster  MD,  ELLIOTT ([id number]) on 11/06/2014 9:25:13 PM      MDM   Final diagnoses:  Chest pain, unspecified chest pain type    Nonspecific chest pain, with low risk, heart score. Doubt ACS, PE or pneumonia.  Nursing Notes Reviewed/ Care Coordinated Applicable Imaging Reviewed Interpretation of Laboratory Data incorporated into ED treatment  The patient appears reasonably screened and/or stabilized for discharge  and I doubt any other medical condition or other [facility] requiring further screening, evaluation, or treatment in the ED at this time prior to discharge.  Plan: Home Medications- Antacid prn; Home Treatments- rest; return here if the recommended treatment, does not improve the symptoms; Recommended follow up- PCP prn     Daleen Bo, MD 11/06/14 2307

## 2014-11-17 DIAGNOSIS — Z23 Encounter for immunization: Secondary | ICD-10-CM | POA: Diagnosis not present

## 2014-11-17 IMAGING — CR DG CHEST 2V
2 series · 2 of 2 positions shown · non-contrast
Comparison: None.

CLINICAL DATA: Cough.  History of smoking.

EXAM:
CHEST  2 VIEW

[PA]
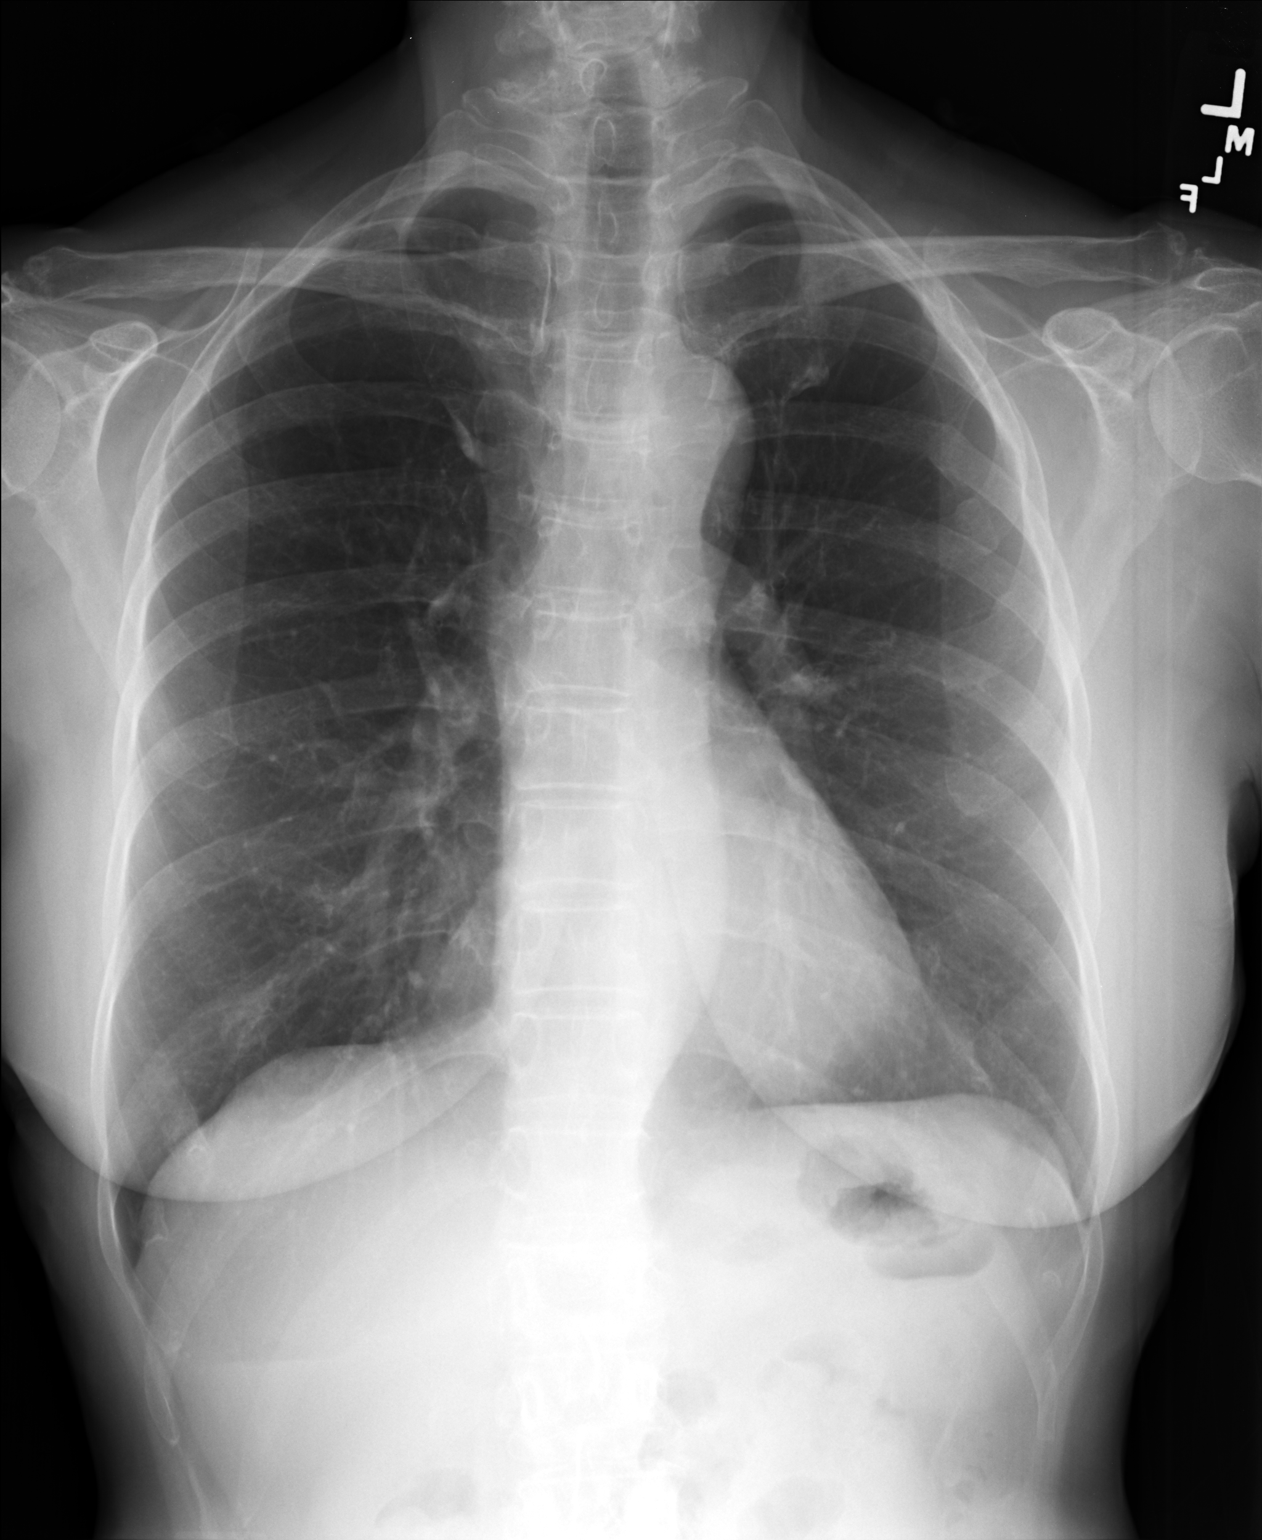

[lateral]
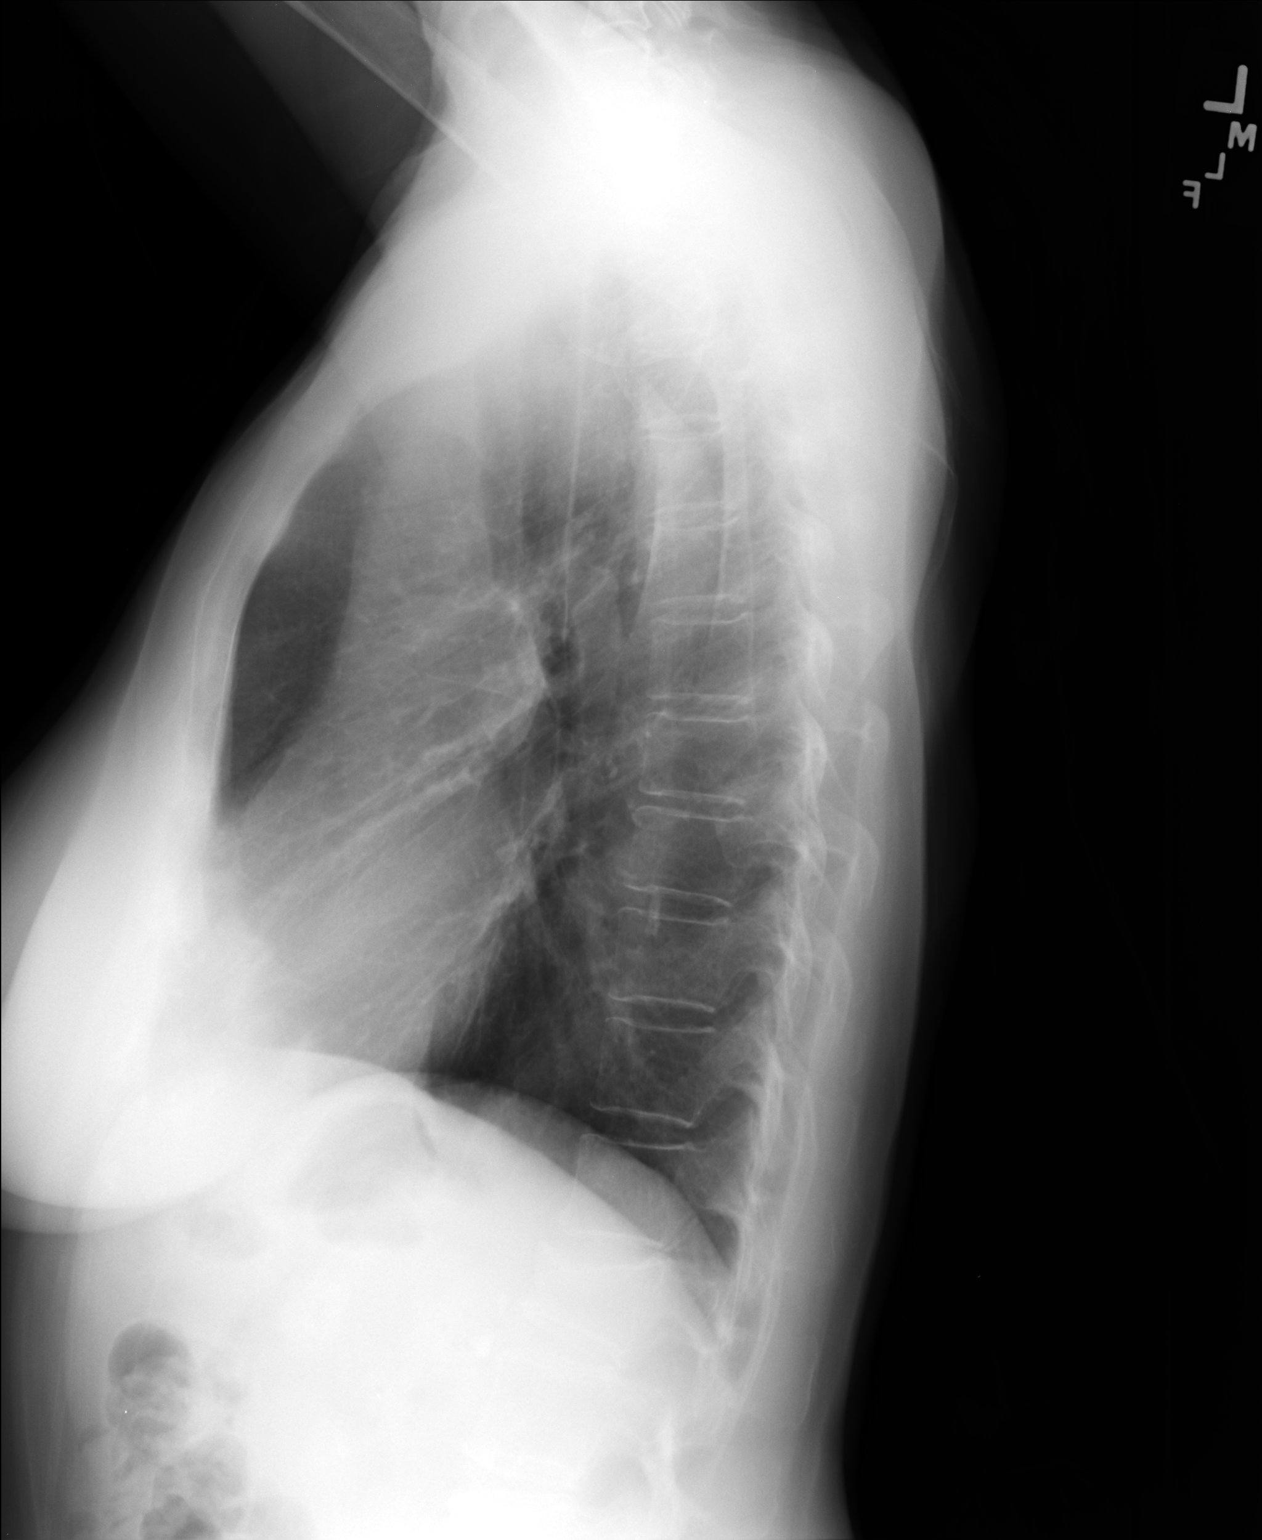

[2 of 2 positions shown; findings below may reference images not displayed]

FINDINGS: Heart size is normal. The lungs are free of focal consolidations and
pleural effusions. No pulmonary edema. Old rib fractures are noted.
IMPRESSION: No evidence for acute  abnormality.

## 2014-12-12 DIAGNOSIS — M810 Age-related osteoporosis without current pathological fracture: Secondary | ICD-10-CM | POA: Diagnosis not present

## 2014-12-12 DIAGNOSIS — D510 Vitamin B12 deficiency anemia due to intrinsic factor deficiency: Secondary | ICD-10-CM | POA: Diagnosis not present

## 2014-12-12 DIAGNOSIS — R8290 Unspecified abnormal findings in urine: Secondary | ICD-10-CM | POA: Diagnosis not present

## 2014-12-12 DIAGNOSIS — Z79899 Other long term (current) drug therapy: Secondary | ICD-10-CM | POA: Diagnosis not present

## 2014-12-12 DIAGNOSIS — E559 Vitamin D deficiency, unspecified: Secondary | ICD-10-CM | POA: Diagnosis not present

## 2014-12-12 DIAGNOSIS — E039 Hypothyroidism, unspecified: Secondary | ICD-10-CM | POA: Diagnosis not present

## 2014-12-12 DIAGNOSIS — N390 Urinary tract infection, site not specified: Secondary | ICD-10-CM | POA: Diagnosis not present

## 2014-12-12 DIAGNOSIS — R5382 Chronic fatigue, unspecified: Secondary | ICD-10-CM | POA: Diagnosis not present

## 2014-12-12 DIAGNOSIS — Z0000 Encounter for general adult medical examination without abnormal findings: Secondary | ICD-10-CM | POA: Diagnosis not present

## 2014-12-20 DIAGNOSIS — D510 Vitamin B12 deficiency anemia due to intrinsic factor deficiency: Secondary | ICD-10-CM | POA: Diagnosis not present

## 2014-12-20 DIAGNOSIS — E039 Hypothyroidism, unspecified: Secondary | ICD-10-CM | POA: Diagnosis not present

## 2014-12-20 DIAGNOSIS — Z0000 Encounter for general adult medical examination without abnormal findings: Secondary | ICD-10-CM | POA: Diagnosis not present

## 2014-12-20 DIAGNOSIS — F329 Major depressive disorder, single episode, unspecified: Secondary | ICD-10-CM | POA: Diagnosis not present

## 2014-12-20 DIAGNOSIS — M810 Age-related osteoporosis without current pathological fracture: Secondary | ICD-10-CM | POA: Diagnosis not present

## 2014-12-20 DIAGNOSIS — Z1389 Encounter for screening for other disorder: Secondary | ICD-10-CM | POA: Diagnosis not present

## 2014-12-20 DIAGNOSIS — R5382 Chronic fatigue, unspecified: Secondary | ICD-10-CM | POA: Diagnosis not present

## 2014-12-20 DIAGNOSIS — E559 Vitamin D deficiency, unspecified: Secondary | ICD-10-CM | POA: Diagnosis not present

## 2015-01-03 ENCOUNTER — Ambulatory Visit (HOSPITAL_COMMUNITY)
Admission: RE | Admit: 2015-01-03 | Discharge: 2015-01-03 | Disposition: A | Discharge status: Home / Self Care | Payer: Medicare Other | Source: Ambulatory Visit | Attending: Internal Medicine | Admitting: Internal Medicine

## 2015-01-03 ENCOUNTER — Encounter (HOSPITAL_COMMUNITY): Payer: Self-pay

## 2015-01-03 DIAGNOSIS — M810 Age-related osteoporosis without current pathological fracture: Secondary | ICD-10-CM | POA: Insufficient documentation

## 2015-01-03 MED ORDER — ZOLEDRONIC ACID 5 MG/100ML IV SOLN
5.0000 mg | Freq: Once | INTRAVENOUS | Status: AC
  Administered 2015-01-03: 5 mg via INTRAVENOUS
  Filled 2015-01-03: qty 100

## 2015-01-03 MED ORDER — SODIUM CHLORIDE 0.9 % IV SOLN
Freq: Once | INTRAVENOUS | Status: AC
  Administered 2015-01-03: 14:00:00 via INTRAVENOUS

## 2015-01-03 NOTE — Discharge Instructions (Signed)

## 2015-01-03 NOTE — Progress Notes (Signed)
D/c instructions on reclast given to pt and explained. Pt is currently on daily vitamin d and calcium.

## 2015-01-12 ENCOUNTER — Encounter: Payer: Self-pay | Admitting: Obstetrics and Gynecology

## 2015-01-12 ENCOUNTER — Ambulatory Visit (INDEPENDENT_AMBULATORY_CARE_PROVIDER_SITE_OTHER): Payer: Medicare Other | Admitting: Obstetrics and Gynecology

## 2015-01-12 VITALS — BP 162/86 | HR 68 | Resp 16 | Ht 60.5 in | Wt 105.0 lb

## 2015-01-12 DIAGNOSIS — Z01419 Encounter for gynecological examination (general) (routine) without abnormal findings: Secondary | ICD-10-CM

## 2015-01-12 DIAGNOSIS — A6920 Lyme disease, unspecified: Secondary | ICD-10-CM | POA: Insufficient documentation

## 2015-01-12 DIAGNOSIS — N189 Chronic kidney disease, unspecified: Secondary | ICD-10-CM | POA: Insufficient documentation

## 2015-01-12 DIAGNOSIS — Z124 Encounter for screening for malignant neoplasm of cervix: Secondary | ICD-10-CM | POA: Diagnosis not present

## 2015-01-12 DIAGNOSIS — E039 Hypothyroidism, unspecified: Secondary | ICD-10-CM | POA: Insufficient documentation

## 2015-01-12 DIAGNOSIS — Z0000 Encounter for general adult medical examination without abnormal findings: Secondary | ICD-10-CM | POA: Diagnosis not present

## 2015-01-12 DIAGNOSIS — M810 Age-related osteoporosis without current pathological fracture: Secondary | ICD-10-CM | POA: Diagnosis not present

## 2015-01-12 DIAGNOSIS — M1990 Unspecified osteoarthritis, unspecified site: Secondary | ICD-10-CM | POA: Insufficient documentation

## 2015-01-12 DIAGNOSIS — R5382 Chronic fatigue, unspecified: Secondary | ICD-10-CM | POA: Insufficient documentation

## 2015-01-12 DIAGNOSIS — M797 Fibromyalgia: Secondary | ICD-10-CM | POA: Insufficient documentation

## 2015-01-12 LAB — POCT URINALYSIS DIPSTICK
BILIRUBIN UA: NEGATIVE
Glucose, UA: NEGATIVE
Ketones, UA: NEGATIVE
Leukocytes, UA: NEGATIVE
NITRITE UA: NEGATIVE
PH UA: 7
PROTEIN UA: NEGATIVE
RBC UA: NEGATIVE
UROBILINOGEN UA: NEGATIVE

## 2015-01-12 NOTE — Progress Notes (Addendum)
Patient ID: Pamela Larson, female   DOB: 1945/09/19, 69 y.o.   MRN: [id number] 69 y.o. G1P1001 SingleCaucasianF here for annual exam.  She deals with abdominal discomfort that she thinks it is interstitial cystitis. She does notice her pants are getting tight, no weight gain. No vaginal bleeding. No bowel c/o. Bladder symptoms are better than it used to be. Not being sexually active helps. No urinary leakage. Intermittent urgency, no change.      No LMP recorded. Patient is postmenopausal.          Sexually active: No.  The current method of family planning is post menopausal status.    Exercising: No.  The patient does not participate in regular exercise at present. Smoker:  former  Health Maintenance: Pap:  2009 WNL per patient History of abnormal Pap:  no MMG:  Summer 2016 WNL Colonoscopy:  2014 Polyps repeat in 5 years  BMD:  2015  osteoporosis on reclast  TDaP:  PCP does  Gardasil: N/A   reports that she quit smoking about 24 years ago. She has never used smokeless tobacco. She reports that she drinks about 8.4 oz of alcohol per week. She reports that she does not use illicit drugs.One daughter, local, 2 grandchildren (53 and 67)  Past Medical History  Diagnosis Date  . Pneumonia     hx of   . Hypothyroidism   . Anemia     pernicious anemia - 1991   . Chronic kidney disease     hx of interstitial cystitis   . 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 Arthritis     rheumatoid arthritis   . Depression   . Lyme disease     hx of possible   . Fibromyalgia   . Osteoporosis   . Anxiety   . Disc disorder of lumbar region 2013    Past Surgical History  Procedure Laterality Date  . Bladder hydrodistention  1998  . Lumbar laminectomy/decompression microdiscectomy  06/10/2011    Procedure: LUMBAR LAMINECTOMY/DECOMPRESSION MICRODISCECTOMY 1 LEVEL;  Surgeon: Hosie Spangle, MD;  Location: [facility];  Service: Neurosurgery;   Laterality: Left;  Lumbar Five Sacral One Lumbar Laminectomy Decompression Microdiscectomy   . Appendectomy  1978  . Breast surgery      reduction   . Cosmetic surgery  2005    face lift    Current Outpatient Prescriptions  Medication Sig Dispense Refill  . acetaminophen (TYLENOL) 500 MG tablet Take 1,000 mg by mouth every 6 (six) hours as needed. For pain    . albuterol (PROVENTIL HFA;VENTOLIN HFA) 108 (90 BASE) MCG/ACT inhaler Inhale 2 puffs into the lungs every 6 (six) hours as needed. For shortness of breath    . ALPRAZolam (XANAX) 0.25 MG tablet Take 0.25 mg by mouth 3 (three) times daily as needed. For anxiety    . calcium carbonate (OS-CAL - DOSED IN MG OF ELEMENTAL CALCIUM) 1250 MG tablet Take 1 tablet by mouth daily.    Marland Kitchen FLUoxetine (PROZAC) 20 MG capsule Take 20 mg by mouth 2 (two) times daily.    Marland Kitchen levothyroxine (SYNTHROID, LEVOTHROID) 125 MCG tablet Take 125 mcg by mouth daily with breakfast.    . omeprazole (PRILOSEC) 20 MG capsule Take 20 mg by mouth daily as needed. For indigestion    . Vitamin D, Ergocalciferol, (DRISDOL) 50000 UNITS CAPS Take 50,000 Units by mouth every 7 (seven) days. On Mondays    .  zoledronic acid (RECLAST) 5 MG/100ML SOLN injection Inject 5 mg into the vein once.     No current facility-administered medications for this visit.    Family History  Problem Relation Age of Onset  . Cancer Mother   . Breast cancer Mother   . Colon cancer Mother   . Pancreatic cancer Mother   . Cancer Maternal Grandmother     Review of Systems  Constitutional: Negative.   HENT: Negative.   Eyes: Negative.   Respiratory: Negative.   Cardiovascular: Negative.   Gastrointestinal: Negative.   Endocrine: Negative.   Genitourinary: Negative.   Musculoskeletal: Negative.   Skin: Negative.   Allergic/Immunologic: Negative.   Neurological: Negative.   Psychiatric/Behavioral: Negative.     Exam:   BP 162/86 mmHg  Pulse 68  Resp 16  Ht 5' 0.5" (1.537 m)  Wt  105 lb (47.628 kg)  BMI 20.16 kg/m2  Weight change: @WEIGHTCHANGE @ Height:   Height: 5' 0.5" (153.7 cm)  Ht Readings from Last 3 Encounters:  01/12/15 5' 0.5" (1.537 m)  11/06/14 5\' 1"  (1.549 m)  09/19/13 5' 0.71" (1.542 m)    General appearance: alert, cooperative and appears stated age Head: Normocephalic, without obvious abnormality, atraumatic Neck: no adenopathy, supple, symmetrical, trachea midline and thyroid normal to inspection and palpation Lungs: clear to auscultation bilaterally Breasts: normal appearance, no masses or tenderness Heart: regular rate and rhythm Abdomen: soft, non-tender; bowel sounds normal; no masses,  no organomegaly Extremities: extremities normal, atraumatic, no cyanosis or edema Skin: Skin color, texture, turgor normal. No rashes or lesions Lymph nodes: Cervical, supraclavicular, and axillary nodes normal. No abnormal inguinal nodes palpated Neurologic: Grossly normal   Pelvic: External genitalia:  no lesions              Urethra:  normal appearing urethra with no masses, tenderness or lesions              Bartholins and Skenes: normal                 Vagina: normal appearing vagina with normal color and discharge, no lesions, atrophic              Cervix: no lesions               Bimanual Exam:  Uterus:  normal size, contour, position, consistency, mobility, non-tender              Adnexa: no mass, fullness, tenderness               Rectovaginal: Confirms               Anus:  normal sphincter tone, no lesions  Chaperone was present for exam.  A:  Well Woman with normal exam  Screening cervical cancer  Osteoporosis, on reclast  P:   Pap with hpv  Mammogram summer 2017  Colonoscopy in 2019  DEXA with primary  Discussed calcium and vit D  Breast self exams discusse  Discussed guidelines for ETOH use in women   Addendum: BP 162/86 Records from her primary MD reviewed. Last BP at primary MD's was 152/80 (12/21/14) Will have her f/u with Dr  Brigitte Pulse  Cc: Dr Marton Redwood

## 2015-01-12 NOTE — Patient Instructions (Signed)

## 2015-01-16 LAB — IPS PAP TEST WITH HPV

## 2015-01-27 ENCOUNTER — Ambulatory Visit (INDEPENDENT_AMBULATORY_CARE_PROVIDER_SITE_OTHER): Payer: Medicare Other | Admitting: Podiatry

## 2015-01-27 ENCOUNTER — Ambulatory Visit (INDEPENDENT_AMBULATORY_CARE_PROVIDER_SITE_OTHER): Payer: Medicare Other

## 2015-01-27 ENCOUNTER — Encounter: Payer: Self-pay | Admitting: Podiatry

## 2015-01-27 DIAGNOSIS — D3610 Benign neoplasm of peripheral nerves and autonomic nervous system, unspecified: Secondary | ICD-10-CM | POA: Diagnosis not present

## 2015-01-27 DIAGNOSIS — M779 Enthesopathy, unspecified: Secondary | ICD-10-CM

## 2015-01-27 MED ORDER — TRIAMCINOLONE ACETONIDE 10 MG/ML IJ SUSP
10.0000 mg | Freq: Once | INTRAMUSCULAR | Status: AC
  Administered 2015-01-27: 10 mg

## 2015-01-27 NOTE — Progress Notes (Signed)
   Subjective:    Patient ID: Pamela Larson, female    DOB: 05-01-1945, 69 y.o.   MRN: [id number]  HPI  Pt stated lt foot between 3rd/4th met. Is been painful and burning sensation for 10 years. Foot get worse especially when weather get cold. Tried to treatment.  Review of Systems  Musculoskeletal: Positive for joint swelling and gait problem.       Objective:   Physical Exam        Assessment & Plan:

## 2015-02-05 NOTE — Progress Notes (Signed)
Subjective:     Patient ID: Pamela Larson, female   DOB: 18-Oct-1945, 69 y.o.   MRN: [id number]  HPI patient presents stating I get a lot of discomfort underneath my left over right foot especially when I stand and it seems like it's been getting gradually worse and I've had some type of discomfort for around 10 years   Review of Systems  All other systems reviewed and are negative.      Objective:   Physical Exam  Constitutional: She is oriented to person, place, and time.  Cardiovascular: Intact distal pulses.   Musculoskeletal: Normal range of motion.  Neurological: She is oriented to person, place, and time.  Skin: Skin is warm.  Nursing note and vitals reviewed.  neurovascular status found to be intact muscle strength adequate range of motion within normal limits with patient found to have discomfort mostly around the fourth metatarsophalangeal joint and also mild discomfort third interspace left over right with no current palpable mass noted. Patient's found have good digital perfusion and is well oriented 3     Assessment:      possibility for inflammatory capsulitis left versus possibility of neuroma symptoms    Plan:      H&P and x-rays reviewed with patient. Discussed both conditions were to focus on the bone structure first and I did a proximal nerve block aspirated the fourth MPJ getting out of small amount of clear fluid and injected with a quarter cc dexamethasone Kenalog and applied thick plantar padding area reappoint for [place] to recheck again

## 2015-02-24 ENCOUNTER — Encounter: Payer: Self-pay | Admitting: Podiatry

## 2015-02-24 ENCOUNTER — Ambulatory Visit (INDEPENDENT_AMBULATORY_CARE_PROVIDER_SITE_OTHER): Payer: Medicare Other | Admitting: Podiatry

## 2015-02-24 VITALS — BP 134/81 | HR 75 | Resp 16

## 2015-02-24 DIAGNOSIS — G5762 Lesion of plantar nerve, left lower limb: Secondary | ICD-10-CM | POA: Diagnosis not present

## 2015-02-24 DIAGNOSIS — D3610 Benign neoplasm of peripheral nerves and autonomic nervous system, unspecified: Secondary | ICD-10-CM

## 2015-02-24 DIAGNOSIS — M779 Enthesopathy, unspecified: Secondary | ICD-10-CM

## 2015-02-26 NOTE — Progress Notes (Signed)
Subjective:     Patient ID: Pamela Larson, female   DOB: 1945/08/16, 70 y.o.   MRN: [id number]  HPI patient states the joint seems better but I'm having a lot of pain between the third and fourth toes of my left foot with shooting discomfort   Review of Systems     Objective:   Physical Exam Neurological status intact negative Homans sign noted with increased discomfort third interspace left with significant reduction of pain fourth MPJ left    Assessment:     Doing well post capsular injection with appearance of acute neuroma symptomatology left which was probably part of the process    Plan:     Educated patient on condition and we will initiate treatment for the nerve condition with the possibility long-term for neuro lysis or for possible neurectomy. Did a sterile prep and then injected directly into the third interspace with a purified alcohol Marcaine solution to try to sclerose the nerve and prevent her from needing surgery

## 2015-03-09 ENCOUNTER — Ambulatory Visit (INDEPENDENT_AMBULATORY_CARE_PROVIDER_SITE_OTHER): Payer: Medicare Other | Admitting: Podiatry

## 2015-03-09 ENCOUNTER — Encounter: Payer: Self-pay | Admitting: Podiatry

## 2015-03-09 DIAGNOSIS — G5762 Lesion of plantar nerve, left lower limb: Secondary | ICD-10-CM

## 2015-03-09 DIAGNOSIS — M779 Enthesopathy, unspecified: Secondary | ICD-10-CM

## 2015-03-09 DIAGNOSIS — D3610 Benign neoplasm of peripheral nerves and autonomic nervous system, unspecified: Secondary | ICD-10-CM

## 2015-03-09 NOTE — Progress Notes (Signed)
Subjective:     Patient ID: Pamela Larson, female   DOB: March 14, 1945, 70 y.o.   MRN: [id number]  HPI patient states I still think there's something they are in a create shooting radiating pains and I might need to have it removed but I like to continue to try to treat conservatively   Review of Systems     Objective:   Physical Exam Neurovascular status intact with continued discomfort third interspace left with radiating discomfort into the adjacent digits and pain    Assessment:     Neuroma symptomatology left with reduction of symptoms with injection but still present to a significant degree    Plan:     Prepped the left foot and did a neuro lysis injection of purified alcohol Marcaine solution 1.3 mL for sclerosing and discussed if this does not work we will consider neurectomy and decide that when we see her back in the next 4 weeks or earlier if needed

## 2015-04-04 DIAGNOSIS — M25512 Pain in left shoulder: Secondary | ICD-10-CM | POA: Diagnosis not present

## 2015-04-06 ENCOUNTER — Ambulatory Visit (INDEPENDENT_AMBULATORY_CARE_PROVIDER_SITE_OTHER): Payer: Medicare Other | Admitting: Podiatry

## 2015-04-06 ENCOUNTER — Encounter: Payer: Self-pay | Admitting: Podiatry

## 2015-04-06 VITALS — BP 134/74 | HR 70 | Resp 16

## 2015-04-06 DIAGNOSIS — G5762 Lesion of plantar nerve, left lower limb: Secondary | ICD-10-CM

## 2015-04-06 DIAGNOSIS — D3610 Benign neoplasm of peripheral nerves and autonomic nervous system, unspecified: Secondary | ICD-10-CM

## 2015-04-06 DIAGNOSIS — D510 Vitamin B12 deficiency anemia due to intrinsic factor deficiency: Secondary | ICD-10-CM | POA: Diagnosis not present

## 2015-04-06 NOTE — Progress Notes (Signed)
Subjective:     Patient ID: WINDY COS, female   DOB: 12/06/1945, 70 y.o.   MRN: [id number]  HPI patient states I'm doing some better but I still have some shooting pain between my third and fourth toes and I feel a clicking-like sensation   Review of Systems     Objective:   Physical Exam Neurovascular status intact with continued discomfort third interspace left with radiating discomfort into the adjacent digits    Assessment:     Neuroma symptoms present left    Plan:     Advised on continued conservative care and discussed possible surgical intervention. She is opted for conservative care and I did a sterile prep of the left forefoot and then injected directly into the nerve root prior to breaking his digital branches 4% purified alcohol Marcaine

## 2015-04-07 DIAGNOSIS — H2513 Age-related nuclear cataract, bilateral: Secondary | ICD-10-CM | POA: Diagnosis not present

## 2015-04-27 ENCOUNTER — Encounter: Payer: Self-pay | Admitting: Podiatry

## 2015-04-27 ENCOUNTER — Ambulatory Visit (INDEPENDENT_AMBULATORY_CARE_PROVIDER_SITE_OTHER): Payer: Medicare Other | Admitting: Podiatry

## 2015-04-27 VITALS — BP 117/74 | HR 85 | Resp 16

## 2015-04-27 DIAGNOSIS — D3610 Benign neoplasm of peripheral nerves and autonomic nervous system, unspecified: Secondary | ICD-10-CM

## 2015-04-27 DIAGNOSIS — M25562 Pain in left knee: Secondary | ICD-10-CM | POA: Diagnosis not present

## 2015-04-27 DIAGNOSIS — G5762 Lesion of plantar nerve, left lower limb: Secondary | ICD-10-CM | POA: Diagnosis not present

## 2015-04-28 ENCOUNTER — Other Ambulatory Visit: Payer: Self-pay | Admitting: Sports Medicine

## 2015-04-28 DIAGNOSIS — M25512 Pain in left shoulder: Secondary | ICD-10-CM

## 2015-04-30 ENCOUNTER — Ambulatory Visit
Admission: RE | Admit: 2015-04-30 | Discharge: 2015-04-30 | Disposition: A | Discharge status: Home / Self Care | Payer: Medicare Other | Source: Ambulatory Visit | Attending: Sports Medicine | Admitting: Sports Medicine

## 2015-04-30 DIAGNOSIS — M19012 Primary osteoarthritis, left shoulder: Secondary | ICD-10-CM | POA: Diagnosis not present

## 2015-04-30 DIAGNOSIS — M25512 Pain in left shoulder: Secondary | ICD-10-CM

## 2015-05-01 NOTE — Progress Notes (Signed)
Subjective:     Patient ID: Pamela Larson, female   DOB: 17-Apr-1945, 70 y.o.   MRN: [id number]  HPI Asian states I'm improving with one spot but still bothers me   Review of Systems     Objective:   Physical Exam Neurovascular status intact with improvement of discomfort in the third interspace with radiating discomfort still noted upon deep palpation    Assessment:     Neuroma symptoms improved but present    Plan:     Final injection administered of neuro lysis agent today 4% purified alcohol Marcaine with epinephrine which was tolerated well

## 2015-05-02 DIAGNOSIS — S42255A Nondisplaced fracture of greater tuberosity of left humerus, initial encounter for closed fracture: Secondary | ICD-10-CM | POA: Diagnosis not present

## 2015-05-04 DIAGNOSIS — S42255A Nondisplaced fracture of greater tuberosity of left humerus, initial encounter for closed fracture: Secondary | ICD-10-CM | POA: Diagnosis not present

## 2015-05-09 DIAGNOSIS — B9689 Other specified bacterial agents as the cause of diseases classified elsewhere: Secondary | ICD-10-CM | POA: Diagnosis not present

## 2015-05-09 DIAGNOSIS — J019 Acute sinusitis, unspecified: Secondary | ICD-10-CM | POA: Diagnosis not present

## 2015-05-16 DIAGNOSIS — R062 Wheezing: Secondary | ICD-10-CM | POA: Diagnosis not present

## 2015-05-16 DIAGNOSIS — J22 Unspecified acute lower respiratory infection: Secondary | ICD-10-CM | POA: Diagnosis not present

## 2015-05-31 DIAGNOSIS — S42255D Nondisplaced fracture of greater tuberosity of left humerus, subsequent encounter for fracture with routine healing: Secondary | ICD-10-CM | POA: Diagnosis not present

## 2015-06-15 DIAGNOSIS — R0689 Other abnormalities of breathing: Secondary | ICD-10-CM | POA: Diagnosis not present

## 2015-06-15 DIAGNOSIS — J22 Unspecified acute lower respiratory infection: Secondary | ICD-10-CM | POA: Diagnosis not present

## 2015-06-15 DIAGNOSIS — R05 Cough: Secondary | ICD-10-CM | POA: Diagnosis not present

## 2015-06-15 DIAGNOSIS — J3089 Other allergic rhinitis: Secondary | ICD-10-CM | POA: Diagnosis not present

## 2015-06-15 DIAGNOSIS — R0602 Shortness of breath: Secondary | ICD-10-CM | POA: Diagnosis not present

## 2015-06-19 DIAGNOSIS — J22 Unspecified acute lower respiratory infection: Secondary | ICD-10-CM | POA: Diagnosis not present

## 2015-06-21 DIAGNOSIS — J209 Acute bronchitis, unspecified: Secondary | ICD-10-CM | POA: Diagnosis not present

## 2015-06-21 DIAGNOSIS — D472 Monoclonal gammopathy: Secondary | ICD-10-CM | POA: Diagnosis not present

## 2015-06-21 DIAGNOSIS — R509 Fever, unspecified: Secondary | ICD-10-CM | POA: Diagnosis not present

## 2015-06-21 DIAGNOSIS — Z6820 Body mass index (BMI) 20.0-20.9, adult: Secondary | ICD-10-CM | POA: Diagnosis not present

## 2015-06-26 DIAGNOSIS — B9689 Other specified bacterial agents as the cause of diseases classified elsewhere: Secondary | ICD-10-CM | POA: Diagnosis not present

## 2015-06-26 DIAGNOSIS — J019 Acute sinusitis, unspecified: Secondary | ICD-10-CM | POA: Diagnosis not present

## 2015-06-27 DIAGNOSIS — R509 Fever, unspecified: Secondary | ICD-10-CM | POA: Diagnosis not present

## 2015-06-27 DIAGNOSIS — Z6820 Body mass index (BMI) 20.0-20.9, adult: Secondary | ICD-10-CM | POA: Diagnosis not present

## 2015-06-27 DIAGNOSIS — J209 Acute bronchitis, unspecified: Secondary | ICD-10-CM | POA: Diagnosis not present

## 2015-09-19 DIAGNOSIS — N3010 Interstitial cystitis (chronic) without hematuria: Secondary | ICD-10-CM | POA: Diagnosis not present

## 2015-09-20 DIAGNOSIS — S42255D Nondisplaced fracture of greater tuberosity of left humerus, subsequent encounter for fracture with routine healing: Secondary | ICD-10-CM | POA: Diagnosis not present

## 2015-09-20 DIAGNOSIS — M25512 Pain in left shoulder: Secondary | ICD-10-CM | POA: Diagnosis not present

## 2015-10-09 DIAGNOSIS — Z1231 Encounter for screening mammogram for malignant neoplasm of breast: Secondary | ICD-10-CM | POA: Diagnosis not present

## 2015-10-09 DIAGNOSIS — Z803 Family history of malignant neoplasm of breast: Secondary | ICD-10-CM | POA: Diagnosis not present

## 2015-10-18 DIAGNOSIS — M79672 Pain in left foot: Secondary | ICD-10-CM | POA: Diagnosis not present

## 2015-10-23 DIAGNOSIS — B9689 Other specified bacterial agents as the cause of diseases classified elsewhere: Secondary | ICD-10-CM | POA: Diagnosis not present

## 2015-10-23 DIAGNOSIS — J22 Unspecified acute lower respiratory infection: Secondary | ICD-10-CM | POA: Diagnosis not present

## 2015-10-23 DIAGNOSIS — R0602 Shortness of breath: Secondary | ICD-10-CM | POA: Diagnosis not present

## 2015-10-23 DIAGNOSIS — J019 Acute sinusitis, unspecified: Secondary | ICD-10-CM | POA: Diagnosis not present

## 2015-11-02 DIAGNOSIS — J22 Unspecified acute lower respiratory infection: Secondary | ICD-10-CM | POA: Diagnosis not present

## 2015-11-10 DIAGNOSIS — J22 Unspecified acute lower respiratory infection: Secondary | ICD-10-CM | POA: Diagnosis not present

## 2015-12-19 DIAGNOSIS — E038 Other specified hypothyroidism: Secondary | ICD-10-CM | POA: Diagnosis not present

## 2015-12-19 DIAGNOSIS — M810 Age-related osteoporosis without current pathological fracture: Secondary | ICD-10-CM | POA: Diagnosis not present

## 2015-12-19 DIAGNOSIS — D510 Vitamin B12 deficiency anemia due to intrinsic factor deficiency: Secondary | ICD-10-CM | POA: Diagnosis not present

## 2015-12-26 DIAGNOSIS — Z0000 Encounter for general adult medical examination without abnormal findings: Secondary | ICD-10-CM | POA: Diagnosis not present

## 2015-12-26 DIAGNOSIS — E559 Vitamin D deficiency, unspecified: Secondary | ICD-10-CM | POA: Diagnosis not present

## 2015-12-26 DIAGNOSIS — N390 Urinary tract infection, site not specified: Secondary | ICD-10-CM | POA: Diagnosis not present

## 2015-12-26 DIAGNOSIS — M810 Age-related osteoporosis without current pathological fracture: Secondary | ICD-10-CM | POA: Diagnosis not present

## 2015-12-26 DIAGNOSIS — R8290 Unspecified abnormal findings in urine: Secondary | ICD-10-CM | POA: Diagnosis not present

## 2015-12-26 DIAGNOSIS — Z23 Encounter for immunization: Secondary | ICD-10-CM | POA: Diagnosis not present

## 2015-12-26 DIAGNOSIS — E038 Other specified hypothyroidism: Secondary | ICD-10-CM | POA: Diagnosis not present

## 2015-12-26 DIAGNOSIS — Z1389 Encounter for screening for other disorder: Secondary | ICD-10-CM | POA: Diagnosis not present

## 2015-12-26 DIAGNOSIS — D472 Monoclonal gammopathy: Secondary | ICD-10-CM | POA: Diagnosis not present

## 2015-12-26 DIAGNOSIS — R5382 Chronic fatigue, unspecified: Secondary | ICD-10-CM | POA: Diagnosis not present

## 2015-12-26 DIAGNOSIS — F329 Major depressive disorder, single episode, unspecified: Secondary | ICD-10-CM | POA: Diagnosis not present

## 2015-12-26 DIAGNOSIS — D510 Vitamin B12 deficiency anemia due to intrinsic factor deficiency: Secondary | ICD-10-CM | POA: Diagnosis not present

## 2016-01-17 ENCOUNTER — Ambulatory Visit: Payer: Medicare Other | Admitting: Obstetrics and Gynecology

## 2016-01-17 DIAGNOSIS — B9689 Other specified bacterial agents as the cause of diseases classified elsewhere: Secondary | ICD-10-CM | POA: Diagnosis not present

## 2016-01-17 DIAGNOSIS — J019 Acute sinusitis, unspecified: Secondary | ICD-10-CM | POA: Diagnosis not present

## 2016-01-31 DIAGNOSIS — J22 Unspecified acute lower respiratory infection: Secondary | ICD-10-CM | POA: Diagnosis not present

## 2016-02-11 DIAGNOSIS — J22 Unspecified acute lower respiratory infection: Secondary | ICD-10-CM | POA: Diagnosis not present

## 2016-03-08 DIAGNOSIS — M4312 Spondylolisthesis, cervical region: Secondary | ICD-10-CM | POA: Diagnosis not present

## 2016-03-08 DIAGNOSIS — M542 Cervicalgia: Secondary | ICD-10-CM | POA: Diagnosis not present

## 2016-03-08 DIAGNOSIS — M5030 Other cervical disc degeneration, unspecified cervical region: Secondary | ICD-10-CM | POA: Diagnosis not present

## 2016-03-08 DIAGNOSIS — M5412 Radiculopathy, cervical region: Secondary | ICD-10-CM | POA: Diagnosis not present

## 2016-03-08 DIAGNOSIS — M4722 Other spondylosis with radiculopathy, cervical region: Secondary | ICD-10-CM | POA: Diagnosis not present

## 2016-03-09 DIAGNOSIS — M4722 Other spondylosis with radiculopathy, cervical region: Secondary | ICD-10-CM | POA: Diagnosis not present

## 2016-03-09 DIAGNOSIS — M542 Cervicalgia: Secondary | ICD-10-CM | POA: Diagnosis not present

## 2016-03-13 ENCOUNTER — Other Ambulatory Visit: Payer: Self-pay | Admitting: Neurosurgery

## 2016-03-13 DIAGNOSIS — M5030 Other cervical disc degeneration, unspecified cervical region: Secondary | ICD-10-CM | POA: Diagnosis not present

## 2016-03-13 DIAGNOSIS — R030 Elevated blood-pressure reading, without diagnosis of hypertension: Secondary | ICD-10-CM | POA: Diagnosis not present

## 2016-03-13 DIAGNOSIS — M5412 Radiculopathy, cervical region: Secondary | ICD-10-CM | POA: Diagnosis not present

## 2016-03-13 DIAGNOSIS — M542 Cervicalgia: Secondary | ICD-10-CM | POA: Diagnosis not present

## 2016-03-13 DIAGNOSIS — M4312 Spondylolisthesis, cervical region: Secondary | ICD-10-CM | POA: Diagnosis not present

## 2016-03-13 DIAGNOSIS — M5020 Other cervical disc displacement, unspecified cervical region: Secondary | ICD-10-CM | POA: Diagnosis not present

## 2016-03-13 DIAGNOSIS — M4722 Other spondylosis with radiculopathy, cervical region: Secondary | ICD-10-CM | POA: Diagnosis not present

## 2016-03-27 ENCOUNTER — Encounter (HOSPITAL_COMMUNITY): Payer: Self-pay

## 2016-03-27 ENCOUNTER — Encounter (HOSPITAL_COMMUNITY)
Admission: RE | Admit: 2016-03-27 | Discharge: 2016-03-27 | Disposition: A | Discharge status: Home / Self Care | Payer: Medicare Other | Source: Ambulatory Visit | Attending: Neurosurgery | Admitting: Neurosurgery

## 2016-03-27 DIAGNOSIS — G629 Polyneuropathy, unspecified: Secondary | ICD-10-CM | POA: Diagnosis not present

## 2016-03-27 DIAGNOSIS — M069 Rheumatoid arthritis, unspecified: Secondary | ICD-10-CM | POA: Diagnosis not present

## 2016-03-27 DIAGNOSIS — Z7951 Long term (current) use of inhaled steroids: Secondary | ICD-10-CM | POA: Diagnosis not present

## 2016-03-27 DIAGNOSIS — F329 Major depressive disorder, single episode, unspecified: Secondary | ICD-10-CM | POA: Diagnosis not present

## 2016-03-27 DIAGNOSIS — Z87891 Personal history of nicotine dependence: Secondary | ICD-10-CM | POA: Insufficient documentation

## 2016-03-27 DIAGNOSIS — E039 Hypothyroidism, unspecified: Secondary | ICD-10-CM | POA: Diagnosis not present

## 2016-03-27 DIAGNOSIS — M797 Fibromyalgia: Secondary | ICD-10-CM | POA: Insufficient documentation

## 2016-03-27 DIAGNOSIS — K219 Gastro-esophageal reflux disease without esophagitis: Secondary | ICD-10-CM | POA: Insufficient documentation

## 2016-03-27 DIAGNOSIS — M5020 Other cervical disc displacement, unspecified cervical region: Secondary | ICD-10-CM | POA: Insufficient documentation

## 2016-03-27 DIAGNOSIS — Z87442 Personal history of urinary calculi: Secondary | ICD-10-CM | POA: Insufficient documentation

## 2016-03-27 DIAGNOSIS — I493 Ventricular premature depolarization: Secondary | ICD-10-CM | POA: Insufficient documentation

## 2016-03-27 DIAGNOSIS — D510 Vitamin B12 deficiency anemia due to intrinsic factor deficiency: Secondary | ICD-10-CM | POA: Insufficient documentation

## 2016-03-27 DIAGNOSIS — F419 Anxiety disorder, unspecified: Secondary | ICD-10-CM | POA: Diagnosis not present

## 2016-03-27 DIAGNOSIS — Z01812 Encounter for preprocedural laboratory examination: Secondary | ICD-10-CM | POA: Insufficient documentation

## 2016-03-27 DIAGNOSIS — Z79899 Other long term (current) drug therapy: Secondary | ICD-10-CM | POA: Insufficient documentation

## 2016-03-27 DIAGNOSIS — Z01818 Encounter for other preprocedural examination: Secondary | ICD-10-CM | POA: Diagnosis not present

## 2016-03-27 HISTORY — DX: Rheumatoid arthritis, unspecified: M06.9

## 2016-03-27 HISTORY — DX: Interstitial cystitis (chronic) without hematuria: N30.10

## 2016-03-27 HISTORY — DX: Gastro-esophageal reflux disease without esophagitis: K21.9

## 2016-03-27 HISTORY — DX: Personal history of urinary calculi: Z87.442

## 2016-03-27 LAB — CBC
HCT: 42.2 % (ref 36.0–46.0)
Hemoglobin: 14 g/dL (ref 12.0–15.0)
MCH: 34.3 pg — AB (ref 26.0–34.0)
MCHC: 33.2 g/dL (ref 30.0–36.0)
MCV: 103.4 fL — AB (ref 78.0–100.0)
PLATELETS: 328 10*3/uL (ref 150–400)
RBC: 4.08 MIL/uL (ref 3.87–5.11)
RDW: 13.6 % (ref 11.5–15.5)
WBC: 6.1 10*3/uL (ref 4.0–10.5)

## 2016-03-27 LAB — BASIC METABOLIC PANEL
Anion gap: 13 (ref 5–15)
BUN: 10 mg/dL (ref 6–20)
CALCIUM: 9.8 mg/dL (ref 8.9–10.3)
CO2: 25 mmol/L (ref 22–32)
Chloride: 101 mmol/L (ref 101–111)
Creatinine, Ser: 0.65 mg/dL (ref 0.44–1.00)
GFR calc Af Amer: >60 mL/min (ref >60)
GLUCOSE: 86 mg/dL (ref 65–99)
Potassium: 4.3 mmol/L (ref 3.5–5.1)
Sodium: 139 mmol/L (ref 135–145)

## 2016-03-27 LAB — SURGICAL PCR SCREEN
MRSA, PCR: NEGATIVE
Staphylococcus aureus: NEGATIVE

## 2016-03-27 NOTE — Pre-Procedure Instructions (Addendum)
Pamela Larson  03/27/2016      [facility], [place] - [place] [address] Phone: [phone] Fax: [phone]    Your procedure is scheduled on 04/03/16.  Report to [facility] Admitting at 630 A.M.  Call this number if you have problems the morning of surgery:  [phone]   Remember:  Do not eat food or drink liquids after midnight.  Take these medicines the morning of surgery with A SIP OF WATER    Inhaler if needed(bring with you),xanax if needed,fluroxitine(prozac),levothyroxine(synthroid) Omeprazole(prilosec)  STOP all herbel meds, nsaids (aleve,naproxen,advil,ibuprofen) 7 days prior to surgery starting Tomorrow 03/28/16, including all vitamins/supplements,probiotic,melatonin,etc   Do not wear jewelry, make-up or nail polish.  Do not wear lotions, powders, or perfumes, or deoderant.  Do not shave 48 hours prior to surgery.  Men may shave face and neck.  Do not bring valuables to the hospital.  [facility] is not responsible for any belongings or valuables.  Contacts, dentures or bridgework may not be worn into surgery.  Leave your suitcase in the car.  After surgery it may be brought to your room.  For patients admitted to the hospital, discharge time will be determined by your treatment team.  Patients discharged the day of surgery will not be allowed to drive home.   Special instructions:  Special Instructions: [place] - Preparing for Surgery  Before surgery, you can play an important role.  Because skin is not sterile, your skin needs to be as free of germs as possible.  You can reduce the number of germs on you skin by washing with CHG (chlorahexidine gluconate) soap before surgery.  CHG is an antiseptic cleaner which kills germs and bonds with the skin to continue killing germs even after washing.  Please DO NOT use if you have an allergy to CHG or antibacterial  soaps.  If your skin becomes reddened/irritated stop using the CHG and inform your nurse when you arrive at Short Stay.  Do not shave (including legs and underarms) for at least 48 hours prior to the first CHG shower.  You may shave your face.  Please follow these instructions carefully:   1.  Shower with CHG Soap the night before surgery and the morning of Surgery.  2.  If you choose to wash your hair, wash your hair first as usual with your normal shampoo.  3.  After you shampoo, rinse your hair and body thoroughly to remove the Shampoo.  4.  Use CHG as you would any other liquid soap.  You can apply chg directly  to the skin and wash gently with scrungie or a clean washcloth.  5.  Apply the CHG Soap to your body ONLY FROM THE NECK DOWN.  Do not use on open wounds or open sores.  Avoid contact with your eyes ears, mouth and genitals (private parts).  Wash genitals (private parts)       with your normal soap.  6.  Wash thoroughly, paying special attention to the area where your surgery will be performed.  7.  Thoroughly rinse your body with warm water from the neck down.  8.  DO NOT shower/wash with your normal soap after using and rinsing off the CHG Soap.  9.  Pat yourself dry with a clean towel.            10.  Wear clean pajamas.  11.  Place clean sheets on your bed the night of your first shower and do not sleep with pets.  Day of Surgery  Do not apply any lotions/deodorants the morning of surgery.  Please wear clean clothes to the hospital/surgery center.  Please read over the fact sheets that you were given.

## 2016-03-28 NOTE — Progress Notes (Signed)
Anesthesia Chart Review: Patient is a 71 year old female scheduled for C6-7 disc arthroplasty on 03/25/16 by Dr. Sherwood Gambler.  History includes former smoker (quit '92), hypothyroidism, pernicious anemia with associated neuropathy '91, rheumatoid arthritis, depression, fibromyalgia, anxiety, nephrolithiasis, GERD, interstitial cystitis, appendectomy, L5-S1 laminectomy '13, cosmetic surgery (face lift) '05, possible Lyme disease.    PCP is Dr. Marton Redwood at [facility].  Meds include albuterol, Xanax, Os-Cal, Prozac, Flonase, Mucinex, levothyroxine, melatonin, Prilosec, Reclast.  BP (!) 158/61   Pulse 89   Temp 36.7 C   Resp 18   Ht 5' (1.524 m)   Wt 108 lb 1.6 oz (49 kg)   SpO2 97%   BMI 21.11 kg/m   EKG 03/27/16: SR with occasional PVC. Single PVC is new when compared to 11/06/14 tracing.  Preoperative labs noted.   If no acute changes then I anticipate that she can proceed as planned.  George [facility]/Anesthesiology Phone [phone] 03/28/2016 5:31 PM

## 2016-04-03 ENCOUNTER — Inpatient Hospital Stay (HOSPITAL_COMMUNITY): Payer: Medicare Other

## 2016-04-03 ENCOUNTER — Inpatient Hospital Stay (HOSPITAL_COMMUNITY)
Admission: RE | Admit: 2016-04-03 | Discharge: 2016-04-03 | DRG: 518 | Disposition: A | Discharge status: Home / Self Care | Payer: Medicare Other | Source: Ambulatory Visit | Attending: Neurosurgery | Admitting: Neurosurgery

## 2016-04-03 ENCOUNTER — Inpatient Hospital Stay (HOSPITAL_COMMUNITY): Payer: Medicare Other | Admitting: Certified Registered Nurse Anesthetist

## 2016-04-03 ENCOUNTER — Encounter (HOSPITAL_COMMUNITY)
Admission: RE | Disposition: A | Discharge status: Home / Self Care | Payer: Self-pay | Source: Ambulatory Visit | Attending: Neurosurgery

## 2016-04-03 ENCOUNTER — Inpatient Hospital Stay (HOSPITAL_COMMUNITY): Payer: Medicare Other | Admitting: Vascular Surgery

## 2016-04-03 DIAGNOSIS — Z881 Allergy status to other antibiotic agents status: Secondary | ICD-10-CM

## 2016-04-03 DIAGNOSIS — Z9889 Other specified postprocedural states: Secondary | ICD-10-CM | POA: Diagnosis not present

## 2016-04-03 DIAGNOSIS — E039 Hypothyroidism, unspecified: Secondary | ICD-10-CM | POA: Diagnosis present

## 2016-04-03 DIAGNOSIS — Z7983 Long term (current) use of bisphosphonates: Secondary | ICD-10-CM | POA: Diagnosis not present

## 2016-04-03 DIAGNOSIS — M4722 Other spondylosis with radiculopathy, cervical region: Secondary | ICD-10-CM | POA: Diagnosis present

## 2016-04-03 DIAGNOSIS — M47812 Spondylosis without myelopathy or radiculopathy, cervical region: Secondary | ICD-10-CM | POA: Diagnosis not present

## 2016-04-03 DIAGNOSIS — K219 Gastro-esophageal reflux disease without esophagitis: Secondary | ICD-10-CM | POA: Diagnosis not present

## 2016-04-03 DIAGNOSIS — F419 Anxiety disorder, unspecified: Secondary | ICD-10-CM | POA: Diagnosis not present

## 2016-04-03 DIAGNOSIS — F329 Major depressive disorder, single episode, unspecified: Secondary | ICD-10-CM | POA: Diagnosis not present

## 2016-04-03 DIAGNOSIS — Z882 Allergy status to sulfonamides status: Secondary | ICD-10-CM | POA: Diagnosis not present

## 2016-04-03 DIAGNOSIS — M069 Rheumatoid arthritis, unspecified: Secondary | ICD-10-CM | POA: Diagnosis present

## 2016-04-03 DIAGNOSIS — Z87890 Personal history of sex reassignment: Secondary | ICD-10-CM | POA: Diagnosis not present

## 2016-04-03 DIAGNOSIS — M5010 Cervical disc disorder with radiculopathy, unspecified cervical region: Principal | ICD-10-CM | POA: Diagnosis present

## 2016-04-03 DIAGNOSIS — M810 Age-related osteoporosis without current pathological fracture: Secondary | ICD-10-CM | POA: Diagnosis present

## 2016-04-03 DIAGNOSIS — M50223 Other cervical disc displacement at C6-C7 level: Secondary | ICD-10-CM | POA: Diagnosis not present

## 2016-04-03 DIAGNOSIS — M5020 Other cervical disc displacement, unspecified cervical region: Secondary | ICD-10-CM | POA: Diagnosis present

## 2016-04-03 DIAGNOSIS — Z885 Allergy status to narcotic agent status: Secondary | ICD-10-CM

## 2016-04-03 DIAGNOSIS — M797 Fibromyalgia: Secondary | ICD-10-CM | POA: Diagnosis not present

## 2016-04-03 DIAGNOSIS — Z419 Encounter for procedure for purposes other than remedying health state, unspecified: Secondary | ICD-10-CM

## 2016-04-03 DIAGNOSIS — M25511 Pain in right shoulder: Secondary | ICD-10-CM | POA: Diagnosis not present

## 2016-04-03 DIAGNOSIS — M5030 Other cervical disc degeneration, unspecified cervical region: Secondary | ICD-10-CM | POA: Diagnosis not present

## 2016-04-03 DIAGNOSIS — Z880 Allergy status to penicillin: Secondary | ICD-10-CM | POA: Diagnosis not present

## 2016-04-03 DIAGNOSIS — M1990 Unspecified osteoarthritis, unspecified site: Secondary | ICD-10-CM | POA: Diagnosis not present

## 2016-04-03 HISTORY — PX: CERVICAL DISC ARTHROPLASTY: SHX587

## 2016-04-03 SURGERY — CERVICAL ANTERIOR DISC ARTHROPLASTY
Anesthesia: General | Site: Spine Cervical

## 2016-04-03 MED ORDER — LIDOCAINE HCL (CARDIAC) 20 MG/ML IV SOLN
INTRAVENOUS | Status: DC | PRN
  Administered 2016-04-03: 40 mg via INTRAVENOUS

## 2016-04-03 MED ORDER — ONDANSETRON HCL 4 MG/2ML IJ SOLN
INTRAMUSCULAR | Status: DC | PRN
  Administered 2016-04-03: 4 mg via INTRAVENOUS

## 2016-04-03 MED ORDER — BISACODYL 10 MG RE SUPP: 10.0000 mg | Freq: Every day | RECTAL | Status: DC | PRN

## 2016-04-03 MED ORDER — LIDOCAINE-EPINEPHRINE (PF) 2 %-1:200000 IJ SOLN
INTRAMUSCULAR | Status: AC
  Filled 2016-04-03: qty 20

## 2016-04-03 MED ORDER — ALUM & MAG HYDROXIDE-SIMETH 200-200-20 MG/5ML PO SUSP: 30.0000 mL | Freq: Four times a day (QID) | ORAL | Status: DC | PRN

## 2016-04-03 MED ORDER — SUGAMMADEX SODIUM 200 MG/2ML IV SOLN
INTRAVENOUS | Status: DC | PRN
  Administered 2016-04-03: 100 mg via INTRAVENOUS

## 2016-04-03 MED ORDER — KETOROLAC TROMETHAMINE 15 MG/ML IJ SOLN
15.0000 mg | Freq: Four times a day (QID) | INTRAMUSCULAR | Status: DC
  Administered 2016-04-03: 15 mg via INTRAVENOUS
  Filled 2016-04-03: qty 1

## 2016-04-03 MED ORDER — SODIUM CHLORIDE 0.9% FLUSH: 3.0000 mL | INTRAVENOUS | Status: DC | PRN

## 2016-04-03 MED ORDER — FENTANYL CITRATE (PF) 100 MCG/2ML IJ SOLN
INTRAMUSCULAR | Status: AC
  Filled 2016-04-03: qty 4

## 2016-04-03 MED ORDER — PROPOFOL 10 MG/ML IV BOLUS
INTRAVENOUS | Status: AC
  Filled 2016-04-03: qty 20

## 2016-04-03 MED ORDER — LIDOCAINE-EPINEPHRINE (PF) 2 %-1:200000 IJ SOLN
INTRAMUSCULAR | Status: DC | PRN
  Administered 2016-04-03: 5 mL

## 2016-04-03 MED ORDER — BUPIVACAINE HCL (PF) 0.5 % IJ SOLN
INTRAMUSCULAR | Status: DC | PRN
  Administered 2016-04-03: 5 mL

## 2016-04-03 MED ORDER — ACETAMINOPHEN 650 MG RE SUPP: 650.0000 mg | Freq: Four times a day (QID) | RECTAL | Status: DC | PRN

## 2016-04-03 MED ORDER — KETOROLAC TROMETHAMINE 15 MG/ML IJ SOLN
INTRAMUSCULAR | Status: AC
  Filled 2016-04-03: qty 1

## 2016-04-03 MED ORDER — SUGAMMADEX SODIUM 200 MG/2ML IV SOLN
INTRAVENOUS | Status: AC
  Filled 2016-04-03: qty 2

## 2016-04-03 MED ORDER — ACETAMINOPHEN 325 MG PO TABS: 650.0000 mg | ORAL_TABLET | Freq: Four times a day (QID) | ORAL | Status: DC | PRN

## 2016-04-03 MED ORDER — ACETAMINOPHEN 10 MG/ML IV SOLN
INTRAVENOUS | Status: DC | PRN
  Administered 2016-04-03: 1000 mg via INTRAVENOUS

## 2016-04-03 MED ORDER — HYDROMORPHONE HCL 1 MG/ML IJ SOLN
INTRAMUSCULAR | Status: AC
  Administered 2016-04-03: 0.25 mg via INTRAVENOUS
  Filled 2016-04-03: qty 0.5

## 2016-04-03 MED ORDER — ROCURONIUM BROMIDE 100 MG/10ML IV SOLN
INTRAVENOUS | Status: DC | PRN
  Administered 2016-04-03: 50 mg via INTRAVENOUS

## 2016-04-03 MED ORDER — KCL IN DEXTROSE-NACL 20-5-0.45 MEQ/L-%-% IV SOLN: INTRAVENOUS | Status: DC

## 2016-04-03 MED ORDER — FENTANYL CITRATE (PF) 100 MCG/2ML IJ SOLN
INTRAMUSCULAR | Status: DC | PRN
  Administered 2016-04-03 (×4): 50 ug via INTRAVENOUS

## 2016-04-03 MED ORDER — DEXAMETHASONE SODIUM PHOSPHATE 10 MG/ML IJ SOLN
INTRAMUSCULAR | Status: DC | PRN
  Administered 2016-04-03: 5 mg via INTRAVENOUS

## 2016-04-03 MED ORDER — ACETAMINOPHEN 325 MG PO TABS: 650.0000 mg | ORAL_TABLET | ORAL | Status: DC | PRN

## 2016-04-03 MED ORDER — CEFAZOLIN SODIUM-DEXTROSE 2-3 GM-% IV SOLR
INTRAVENOUS | Status: DC | PRN
  Administered 2016-04-03: 2 g via INTRAVENOUS

## 2016-04-03 MED ORDER — DEXAMETHASONE SODIUM PHOSPHATE 10 MG/ML IJ SOLN
INTRAMUSCULAR | Status: AC
  Filled 2016-04-03: qty 1

## 2016-04-03 MED ORDER — HYDROXYZINE HCL 25 MG PO TABS: 50.0000 mg | ORAL_TABLET | ORAL | Status: DC | PRN

## 2016-04-03 MED ORDER — KETOROLAC TROMETHAMINE 15 MG/ML IJ SOLN
15.0000 mg | Freq: Once | INTRAMUSCULAR | Status: AC
  Administered 2016-04-03: 15 mg via INTRAVENOUS

## 2016-04-03 MED ORDER — NYSTATIN 100000 UNIT/ML MT SUSP: 5.0000 mL | Freq: Four times a day (QID) | OROMUCOSAL | 0 refills | Status: DC

## 2016-04-03 MED ORDER — THROMBIN 5000 UNITS EX SOLR
CUTANEOUS | Status: DC | PRN
  Administered 2016-04-03 (×2): 5000 [IU] via TOPICAL

## 2016-04-03 MED ORDER — LEVOTHYROXINE SODIUM 50 MCG PO TABS
50.0000 ug | ORAL_TABLET | Freq: Every day | ORAL | Status: DC
  Filled 2016-04-03: qty 1

## 2016-04-03 MED ORDER — SODIUM CHLORIDE 0.9% FLUSH: 3.0000 mL | Freq: Two times a day (BID) | INTRAVENOUS | Status: DC

## 2016-04-03 MED ORDER — 0.9 % SODIUM CHLORIDE (POUR BTL) OPTIME
TOPICAL | Status: DC | PRN
  Administered 2016-04-03: 1000 mL

## 2016-04-03 MED ORDER — EPHEDRINE SULFATE 50 MG/ML IJ SOLN
INTRAMUSCULAR | Status: DC | PRN
  Administered 2016-04-03 (×2): 5 mg via INTRAVENOUS

## 2016-04-03 MED ORDER — MORPHINE SULFATE (PF) 4 MG/ML IV SOLN: 4.0000 mg | INTRAVENOUS | Status: DC | PRN

## 2016-04-03 MED ORDER — ONDANSETRON HCL 4 MG/2ML IJ SOLN
INTRAMUSCULAR | Status: AC
  Filled 2016-04-03: qty 2

## 2016-04-03 MED ORDER — VANCOMYCIN HCL IN DEXTROSE 1-5 GM/200ML-% IV SOLN
1000.0000 mg | INTRAVENOUS | Status: DC
  Filled 2016-04-03: qty 200

## 2016-04-03 MED ORDER — OXYCODONE HCL 5 MG PO TABS
ORAL_TABLET | ORAL | Status: AC
  Administered 2016-04-03: 5 mg via ORAL
  Filled 2016-04-03: qty 1

## 2016-04-03 MED ORDER — CEFAZOLIN SODIUM-DEXTROSE 2-4 GM/100ML-% IV SOLN
INTRAVENOUS | Status: AC
  Filled 2016-04-03: qty 100

## 2016-04-03 MED ORDER — PHENOL 1.4 % MT LIQD: 1.0000 | OROMUCOSAL | Status: DC | PRN

## 2016-04-03 MED ORDER — OXYCODONE HCL 5 MG/5ML PO SOLN: 5.0000 mg | Freq: Once | ORAL | Status: AC | PRN

## 2016-04-03 MED ORDER — PROPOFOL 10 MG/ML IV BOLUS
INTRAVENOUS | Status: DC | PRN
  Administered 2016-04-03: 120 mg via INTRAVENOUS

## 2016-04-03 MED ORDER — MELATONIN 3 MG PO TABS
3.0000 mg | ORAL_TABLET | Freq: Every evening | ORAL | Status: DC | PRN
  Filled 2016-04-03: qty 1

## 2016-04-03 MED ORDER — FLUOXETINE HCL 20 MG PO CAPS: 20.0000 mg | ORAL_CAPSULE | Freq: Two times a day (BID) | ORAL | Status: DC

## 2016-04-03 MED ORDER — MIDAZOLAM HCL 5 MG/5ML IJ SOLN
INTRAMUSCULAR | Status: DC | PRN
  Administered 2016-04-03: 2 mg via INTRAVENOUS

## 2016-04-03 MED ORDER — LACTATED RINGERS IV SOLN
INTRAVENOUS | Status: DC | PRN
  Administered 2016-04-03 (×2): via INTRAVENOUS

## 2016-04-03 MED ORDER — ACETAMINOPHEN 10 MG/ML IV SOLN
INTRAVENOUS | Status: AC
  Filled 2016-04-03: qty 100

## 2016-04-03 MED ORDER — ACETAMINOPHEN 650 MG RE SUPP: 650.0000 mg | RECTAL | Status: DC | PRN

## 2016-04-03 MED ORDER — HYDROXYZINE HCL 50 MG/ML IM SOLN: 50.0000 mg | INTRAMUSCULAR | Status: DC | PRN

## 2016-04-03 MED ORDER — OXYCODONE HCL 5 MG PO TABS
5.0000 mg | ORAL_TABLET | Freq: Once | ORAL | Status: AC | PRN
  Administered 2016-04-03: 5 mg via ORAL

## 2016-04-03 MED ORDER — HYDROMORPHONE HCL 1 MG/ML IJ SOLN
0.2500 mg | INTRAMUSCULAR | Status: DC | PRN
  Administered 2016-04-03: 0.25 mg via INTRAVENOUS

## 2016-04-03 MED ORDER — MAGNESIUM HYDROXIDE 400 MG/5ML PO SUSP: 30.0000 mL | Freq: Every day | ORAL | Status: DC | PRN

## 2016-04-03 MED ORDER — THROMBIN 5000 UNITS EX SOLR
OROMUCOSAL | Status: DC | PRN
  Administered 2016-04-03: 5 mL via TOPICAL

## 2016-04-03 MED ORDER — SODIUM CHLORIDE 0.9 % IR SOLN
Status: DC | PRN
  Administered 2016-04-03: 500 mL

## 2016-04-03 MED ORDER — HYDROCODONE-ACETAMINOPHEN 5-325 MG PO TABS: 1.0000 | ORAL_TABLET | ORAL | 0 refills | Status: DC | PRN

## 2016-04-03 MED ORDER — ONDANSETRON HCL 4 MG PO TABS: 4.0000 mg | ORAL_TABLET | Freq: Four times a day (QID) | ORAL | Status: DC | PRN

## 2016-04-03 MED ORDER — FLEET ENEMA 7-19 GM/118ML RE ENEM: 1.0000 | ENEMA | Freq: Once | RECTAL | Status: DC | PRN

## 2016-04-03 MED ORDER — MIDAZOLAM HCL 2 MG/2ML IJ SOLN
INTRAMUSCULAR | Status: AC
  Filled 2016-04-03: qty 2

## 2016-04-03 MED ORDER — MENTHOL 3 MG MT LOZG: 1.0000 | LOZENGE | OROMUCOSAL | Status: DC | PRN

## 2016-04-03 MED ORDER — ONDANSETRON HCL 4 MG/2ML IJ SOLN: 4.0000 mg | Freq: Four times a day (QID) | INTRAMUSCULAR | Status: DC | PRN

## 2016-04-03 MED ORDER — CYCLOBENZAPRINE HCL 5 MG PO TABS: 5.0000 mg | ORAL_TABLET | Freq: Three times a day (TID) | ORAL | Status: DC | PRN

## 2016-04-03 MED ORDER — THROMBIN 5000 UNITS EX SOLR
CUTANEOUS | Status: AC
  Filled 2016-04-03: qty 15000

## 2016-04-03 MED ORDER — HYDROCODONE-ACETAMINOPHEN 5-325 MG PO TABS
1.0000 | ORAL_TABLET | ORAL | Status: DC | PRN
  Administered 2016-04-03: 1 via ORAL
  Filled 2016-04-03: qty 1

## 2016-04-03 MED ORDER — HEMOSTATIC AGENTS (NO CHARGE) OPTIME
TOPICAL | Status: DC | PRN
  Administered 2016-04-03: 1 via TOPICAL

## 2016-04-03 MED ORDER — BUPIVACAINE HCL (PF) 0.5 % IJ SOLN
INTRAMUSCULAR | Status: AC
  Filled 2016-04-03: qty 30

## 2016-04-03 SURGICAL SUPPLY — 61 items
ADH SKN CLS APL DERMABOND .7 (GAUZE/BANDAGES/DRESSINGS) ×1
BAG DECANTER FOR FLEXI CONT (MISCELLANEOUS) ×2 IMPLANT
BIT DRILL  MCOR (BIT) ×1 IMPLANT
BIT DRILL NEURO 2X3.1 SFT TUCH (MISCELLANEOUS) ×1 IMPLANT
BLADE ULTRA TIP 2M (BLADE) ×2 IMPLANT
BUR BARREL STRAIGHT FLUTE 4.0 (BURR) IMPLANT
CANISTER SUCT 3000ML PPV (MISCELLANEOUS) ×2 IMPLANT
CARTRIDGE OIL MAESTRO DRILL (MISCELLANEOUS) ×1 IMPLANT
DECANTER SPIKE VIAL GLASS SM (MISCELLANEOUS) ×2 IMPLANT
DERMABOND ADVANCED (GAUZE/BANDAGES/DRESSINGS) ×1
DERMABOND ADVANCED .7 DNX12 (GAUZE/BANDAGES/DRESSINGS) ×1 IMPLANT
DIFFUSER DRILL AIR PNEUMATIC (MISCELLANEOUS) ×2 IMPLANT
DISC CERVICAL PRESTIGE 7X16MM (Orthopedic Implant) ×1 IMPLANT
DRAPE C-ARM 42X72 X-RAY (DRAPES) ×2 IMPLANT
DRAPE HALF SHEET 40X57 (DRAPES) ×1 IMPLANT
DRAPE LAPAROTOMY 100X72 PEDS (DRAPES) ×2 IMPLANT
DRAPE MICROSCOPE LEICA (MISCELLANEOUS) ×2 IMPLANT
DRAPE POUCH INSTRU U-SHP 10X18 (DRAPES) ×2 IMPLANT
DRILL NEURO 2X3.1 SOFT TOUCH (MISCELLANEOUS) ×2
DRSG PAD ABDOMINAL 8X10 ST (GAUZE/BANDAGES/DRESSINGS) IMPLANT
ELECT COATED BLADE 2.86 ST (ELECTRODE) ×2 IMPLANT
ELECT REM PT RETURN 9FT ADLT (ELECTROSURGICAL) ×2
ELECTRODE REM PT RTRN 9FT ADLT (ELECTROSURGICAL) ×1 IMPLANT
GAUZE SPONGE 4X4 12PLY STRL (GAUZE/BANDAGES/DRESSINGS) IMPLANT
GAUZE SPONGE 4X4 16PLY XRAY LF (GAUZE/BANDAGES/DRESSINGS) IMPLANT
GLOVE BIOGEL PI IND STRL 7.0 (GLOVE) IMPLANT
GLOVE BIOGEL PI IND STRL 7.5 (GLOVE) IMPLANT
GLOVE BIOGEL PI IND STRL 8 (GLOVE) ×1 IMPLANT
GLOVE BIOGEL PI INDICATOR 7.0 (GLOVE) ×1
GLOVE BIOGEL PI INDICATOR 7.5 (GLOVE) ×2
GLOVE BIOGEL PI INDICATOR 8 (GLOVE) ×1
GLOVE ECLIPSE 6.5 STRL STRAW (GLOVE) ×1 IMPLANT
GLOVE ECLIPSE 7.5 STRL STRAW (GLOVE) ×2 IMPLANT
GLOVE EXAM NITRILE LRG STRL (GLOVE) IMPLANT
GLOVE EXAM NITRILE XL STR (GLOVE) IMPLANT
GLOVE EXAM NITRILE XS STR PU (GLOVE) IMPLANT
GLOVE SURG SS PI 7.0 STRL IVOR (GLOVE) ×2 IMPLANT
GOWN STRL REUS W/ TWL LRG LVL3 (GOWN DISPOSABLE) IMPLANT
GOWN STRL REUS W/ TWL XL LVL3 (GOWN DISPOSABLE) ×2 IMPLANT
GOWN STRL REUS W/TWL 2XL LVL3 (GOWN DISPOSABLE) ×1 IMPLANT
GOWN STRL REUS W/TWL LRG LVL3 (GOWN DISPOSABLE) ×2
GOWN STRL REUS W/TWL XL LVL3 (GOWN DISPOSABLE) ×4
HEMOSTAT POWDER KIT SURGIFOAM (HEMOSTASIS) ×2 IMPLANT
KIT BASIN OR (CUSTOM PROCEDURE TRAY) ×2 IMPLANT
KIT ROOM TURNOVER OR (KITS) ×2 IMPLANT
NDL SPNL 22GX3.5 QUINCKE BK (NEEDLE) ×1 IMPLANT
NEEDLE HYPO 25X1 1.5 SAFETY (NEEDLE) ×2 IMPLANT
NEEDLE SPNL 22GX3.5 QUINCKE BK (NEEDLE) ×4 IMPLANT
NS IRRIG 1000ML POUR BTL (IV SOLUTION) ×2 IMPLANT
OIL CARTRIDGE MAESTRO DRILL (MISCELLANEOUS) ×2
PACK LAMINECTOMY NEURO (CUSTOM PROCEDURE TRAY) ×2 IMPLANT
PAD ARMBOARD 7.5X6 YLW CONV (MISCELLANEOUS) ×5 IMPLANT
RUBBERBAND STERILE (MISCELLANEOUS) ×4 IMPLANT
SPONGE INTESTINAL PEANUT (DISPOSABLE) ×2 IMPLANT
SPONGE SURGIFOAM ABS GEL SZ50 (HEMOSTASIS) ×2 IMPLANT
STAPLER SKIN PROX WIDE 3.9 (STAPLE) IMPLANT
SUT VIC AB 2-0 CP2 18 (SUTURE) ×2 IMPLANT
SUT VIC AB 3-0 SH 8-18 (SUTURE) ×3 IMPLANT
TOWEL OR 17X24 6PK STRL BLUE (TOWEL DISPOSABLE) ×2 IMPLANT
TOWEL OR 17X26 10 PK STRL BLUE (TOWEL DISPOSABLE) ×2 IMPLANT
WATER STERILE IRR 1000ML POUR (IV SOLUTION) ×2 IMPLANT

## 2016-04-03 NOTE — Op Note (Signed)
04/03/2016  10:38 AM  PATIENT:  Pamela Larson  71 y.o. 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  ANESTHESIA:   general  EBL:  Total I/O In: -  Out: 25 [Blood:25]  BLOOD ADMINISTERED:none  COUNT: Correct per nursing staff  DICTATION: Patient was brought to the operating room placed under general endotracheal anesthesia. Patient was placed in 10 pounds of halter traction. The neck was prepped with Betadine soap and solution and draped in a sterile fashion. A horizontal incision was made on the left side of the neck. The line of the incision was infiltrated with local anesthetic with epinephrine. Dissection was carried down thru the subcutaneous tissue and platysma, bipolar cautery was used to maintain hemostasis. Dissection was then carried down thru an avascular plane leaving the sternocleidomastoid carotid artery and jugular vein laterally and the trachea and esophagus medially. The ventral aspect of the vertebral column was identified and a localizing x-ray was taken. The C6-7 level was identified. The annulus was incised and the disc space entered. Discectomy was performed with micro-curettes and pituitary rongeurs. The operating microscope was draped and brought into the field provided additional magnification illumination and visualization. Discectomy was continued posteriorly thru the disc space and then the cartilaginous endplate was removed using micro-curettes. Posterior osteophytic overgrowth was removed using the high-speed drill along with a 2 mm thin footplated  Kerrison punch. Posterior longitudinal ligament along with disc herniation was carefully removed, decompressing the spinal canal and thecal sac. We then continued to remove osteophytic overgrowth and disc material decompressing the neural foramina and exiting nerve roots bilaterally. Once the decompression was completed, hemostasis was established with the use of Gelfoam with thrombin and bipolar cautery. The Gelfoam was removed, a thin layer of Surgifoam applied, the wound irrigated, and hemostasis confirmed. The C-arm was then draped and brought in the field to proceed with the disc arthroplasty. Using sizers, we selected a 7 x 16 Prestige LP implant.  Working over the [place], and using a drill guide, four pilot holes were drilled, we then used the chisel to create 4 channels for the implant. We then gently tamped the implant into the interbody space. Good positioning of the implant was confirmed with the C-arm fluoroscope, and then its inserter was removed and final C-arm images in lateral and AP projections were obtained.  The wound was irrigated with bacitracin solution checked for hemostasis which was established and confirmed.  We then proceeded with closure. The platysma was closed with interrupted inverted 2-0 undyed Vicryl suture, the subcutaneous and subcuticular closed with interrupted inverted 3-0 undyed Vicryl suture. The skin edges were approximated with Dermabond. Following surgery the patient was taken out of cervical traction. To be reversed and the anesthetic and taken to the recovery room for further care.  PLAN OF CARE: Admit to inpatient   PATIENT DISPOSITION:  PACU - hemodynamically stable.   Delay start of Pharmacological VTE agent (>24hrs) due to surgical blood loss or risk of bleeding:  yes

## 2016-04-03 NOTE — Discharge Instructions (Signed)

## 2016-04-03 NOTE — Anesthesia Preprocedure Evaluation (Signed)
Anesthesia Evaluation  Patient identified by MRN, date of birth, ID band Patient awake    Reviewed: Allergy & Precautions, NPO status , Patient's Chart, lab work & pertinent test results  History of Anesthesia Complications Negative for: history of anesthetic complications  Airway Mallampati: II  TM Distance: >3 FB Neck ROM: Full    Dental  (+) Teeth Intact   Pulmonary neg shortness of breath, neg COPD, former smoker,    breath sounds clear to auscultation       Cardiovascular negative cardio ROS   Rhythm:Regular     Neuro/Psych PSYCHIATRIC DISORDERS Anxiety Depression  Neuromuscular disease    GI/Hepatic GERD  ,  Endo/Other  Hypothyroidism   Renal/GU Renal disease     Musculoskeletal  (+) Arthritis , Fibromyalgia -  Abdominal   Peds  Hematology  (+) anemia ,   Anesthesia Other Findings   Reproductive/Obstetrics                             Anesthesia Physical Anesthesia Plan  ASA: II  Anesthesia Plan: General   Post-op Pain Management:    Induction: Intravenous  Airway Management Planned: Oral ETT  Additional Equipment: None  Intra-op Plan:   Post-operative Plan: Extubation in OR  Informed Consent: I have reviewed the patients History and Physical, chart, labs and discussed the procedure including the risks, benefits and alternatives for the proposed anesthesia with the patient or authorized representative who has indicated his/her understanding and acceptance.   Dental advisory given  Plan Discussed with: CRNA and Surgeon  Anesthesia Plan Comments:         Anesthesia Quick Evaluation

## 2016-04-03 NOTE — Anesthesia Procedure Notes (Signed)
Procedure Name: Intubation Performed by: Valda Favia Pre-anesthesia Checklist: Patient identified, Emergency Drugs available, Suction available, Patient being monitored and Timeout performed Patient Re-evaluated:Patient Re-evaluated prior to inductionOxygen Delivery Method: Circle system utilized Preoxygenation: Pre-oxygenation with 100% oxygen Intubation Type: IV induction Ventilation: Mask ventilation without difficulty Laryngoscope Size: Miller and 2 Grade View: Grade I Tube type: Oral Tube size: 7.0 mm Number of attempts: 1 Airway Equipment and Method: Stylet Placement Confirmation: ETT inserted through vocal cords under direct vision,  positive ETCO2 and breath sounds checked- equal and bilateral Secured at: 21 cm Tube secured with: Tape Dental Injury: Teeth and Oropharynx as per pre-operative assessment

## 2016-04-03 NOTE — Discharge Summary (Signed)
Physician Discharge Summary  Patient ID: Pamela Larson MRN: [id number] DOB/AGE: 05-30-1945 71 y.o.  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 She has done well following surgery. She is up and ambulate actively in the halls. She has voided. She is comfortable, and is asking to be discharged home. She's been instructed in activities and wound care following discharge. She's been instructed to use the incentive respirometer each hour. She is scheduled to follow-up with me in the office in 3 weeks.  Discharge Exam: Blood pressure (!) 134/53, pulse 74, temperature 98.6 F (37 C), resp. rate 18, weight 49 kg (108 lb), SpO2 97 %.  Disposition: 01-Home or Self Care  Discharge Instructions    Discharge wound care:    Complete by:  As directed    Leave the wound open to air. Shower daily with the wound uncovered. Water and soapy water should run over the incision area. Do not wash directly on the incision for 2 weeks. Remove the glue after 2 weeks.   Driving Restrictions    Complete by:  As directed    No driving for 2 weeks. May ride in the car locally now. May begin to drive locally in 2 weeks.   Other Restrictions    Complete by:  As directed    Walk gradually increasing distances out in the fresh air at least twice a day. Walking additional 6 times inside the house, gradually increasing distances, daily. No bending, lifting, or twisting. Perform activities between shoulder and waist height (that is at counter height when  standing or table height when sitting).     Allergies as of 04/03/2016      Reactions   Amoxicillin Nausea And Vomiting   Has patient had a PCN reaction causing immediate rash, facial/tongue/throat swelling, SOB or lightheadedness with hypotension: #  #  #  YES  #  #  #  Has patient had a PCN reaction causing severe rash involving mucus membranes or skin necrosis: No Has patient had a PCN reaction that required hospitalization No Has patient had a PCN reaction occurring within the last 10 years: #  #  #  YES  #  #  #  If all of the above answers are "NO", then may proceed with Cephalosporin use.   Augmentin [amoxicillin-pot Clavulanate] Nausea And Vomiting   Demerol [meperidine Hcl] Nausea And Vomiting   Sulfa Antibiotics Nausea And Vomiting      Medication List    TAKE these medications   acetaminophen 500 MG tablet Commonly known as:  TYLENOL Take 1,000 mg by mouth every 6 (six) hours as needed for mild pain. For pain   albuterol 108 (90 Base) MCG/ACT inhaler Commonly known as:  PROVENTIL HFA;VENTOLIN HFA Inhale 2 puffs into the lungs every 6 (six) hours as needed for wheezing or shortness of breath. For shortness of breath   ALPRAZolam 0.25 MG tablet Commonly known as:  XANAX Take 0.125 mg by mouth daily as needed for anxiety. Pt takes 1/2 tablet as needed   calcium carbonate 1250 (500 Ca) MG tablet Commonly known as:  OS-CAL - dosed in mg of elemental calcium  Take 1 tablet by mouth daily.   FLUoxetine 20 MG capsule Commonly known as:  PROZAC Take 20 mg by mouth 2 (two) times daily.   fluticasone 50 MCG/ACT nasal spray Commonly known as:  FLONASE Place 1 spray into both nostrils daily as needed for allergies or rhinitis.   guaiFENesin 600 MG 12 hr tablet Commonly known as:  MUCINEX Take 600 mg by mouth 2 (two) times daily as needed for to loosen phlegm.   HYDROcodone-acetaminophen 5-325 MG tablet Commonly known as:  NORCO/VICODIN Take 1-2 tablets by mouth every 4  (four) hours as needed (pain).   levothyroxine 100 MCG tablet Commonly known as:  SYNTHROID, LEVOTHROID Take 50-100 mcg by mouth See admin instructions. TAKES 100 MCG DAILY EXCEPT ON THURSDAYS AND TAKES 50 MCG   Melatonin 3 MG Caps Take 3 mg by mouth at bedtime as needed (SLEEP).   naproxen sodium 220 MG tablet Commonly known as:  ANAPROX Take 440 mg by mouth 2 (two) times daily as needed (PAIN).   nystatin 100000 UNIT/ML suspension Commonly known as:  MYCOSTATIN Take 5 mLs (500,000 Units total) by mouth 4 (four) times daily.   omeprazole 20 MG capsule Commonly known as:  PRILOSEC Take 20 mg by mouth daily as needed. For indigestion   PROBIOTIC PO Take 4 tablets by mouth 4 (four) times daily.   RECLAST 5 MG/100ML Soln injection Generic drug:  zoledronic acid Inject 5 mg into the vein once. Last dose 10/17 yearly   sodium chloride 0.65 % Soln nasal spray Commonly known as:  OCEAN Place 1 spray into both nostrils 2 (two) times daily as needed for congestion.   Vitamin D (Ergocalciferol) 50000 units Caps capsule Commonly known as:  DRISDOL Take 50,000 Units by mouth every 7 (seven) days. On Mondays        Signed: Hosie Spangle 04/03/2016, 6:22 PM

## 2016-04-03 NOTE — Progress Notes (Signed)
Discharged instructions/AVS/ Rx/education given to patient with daughter at bedside and they both verbalized understanding. Patient MAE well, ambulating well, voiding with no difficulties and patient ate her dinner with no issues. No swelling, no drainage and no redness noted on incision site. D/C via wheelchair.

## 2016-04-03 NOTE — H&P (Signed)
Subjective: Patient is a 71 y.o. right-handed white female who is admitted for treatment of right C6-7 cervical disc herniation, with right cervical radiculopathy.  Patient's symptoms began about 3 months ago, with pain to the right shoulder, arm, forearm, and into the right hand and its digits. She's had weakness in the right upper extremity was found to have the multi-level cervical degeneration with a superimposed right C6-7 cervical disc herniation. Patient admitted now for a C6-7 disc arthroplasty.   Patient Active Problem List   Diagnosis Date Noted  . Chronic fatigue   . Hypothyroidism   . Chronic kidney disease   . Arthritis   . Lyme disease   . Fibromyalgia   . Osteoporosis    Past Medical History:  Diagnosis Date  . Anemia    pernicious anemia - 1991   . Anxiety   . Arthritis    rheumatoid arthritis   . Chronic fatigue   . Cystitis, interstitial   . Depression   . Disc disorder of lumbar region 2013  . Fibromyalgia   . GERD (gastroesophageal reflux disease)   . History of kidney stones   . Hypothyroidism   . Lyme disease    hx of possible   . Neuromuscular disorder ([place])    hx of neuropathy in 1991 due to pernicious anemia   . Osteoporosis   . Pneumonia    hx of   . Rheumatoid arthritis [facility])     Past Surgical History:  Procedure Laterality Date  . APPENDECTOMY  1978  . bladder hydrodistention  1998  . BREAST SURGERY     reduction   . COSMETIC SURGERY  2005   face lift  . EYE SURGERY    . LUMBAR LAMINECTOMY/DECOMPRESSION MICRODISCECTOMY  06/10/2011   Procedure: LUMBAR LAMINECTOMY/DECOMPRESSION MICRODISCECTOMY 1 LEVEL;  Surgeon: Hosie Spangle, MD;  Location: [place] NEURO ORS;  Service: Neurosurgery;  Laterality: Left;  Lumbar Five Sacral One Lumbar Laminectomy Decompression Microdiscectomy     Prescriptions Prior to Admission  Medication Sig Dispense Refill Last Dose  . acetaminophen (TYLENOL) 500 MG tablet Take 1,000 mg by mouth every 6 (six) hours as  needed for mild pain. For pain   Past Week at Unknown time  . ALPRAZolam (XANAX) 0.25 MG tablet Take 0.125 mg by mouth daily as needed for anxiety. Pt takes 1/2 tablet as needed    04/02/2016 at 2330  . calcium carbonate (OS-CAL - DOSED IN MG OF ELEMENTAL CALCIUM) 1250 MG tablet Take 1 tablet by mouth daily.   Past Week at Unknown time  . FLUoxetine (PROZAC) 20 MG capsule Take 20 mg by mouth 2 (two) times daily.   Past Week at Unknown time  . fluticasone (FLONASE) 50 MCG/ACT nasal spray Place 1 spray into both nostrils daily as needed for allergies or rhinitis.   Past Week at Unknown time  . levothyroxine (SYNTHROID, LEVOTHROID) 100 MCG tablet Take 50-100 mcg by mouth See admin instructions. TAKES 100 MCG DAILY EXCEPT ON THURSDAYS AND TAKES 50 MCG   04/02/2016 at Unknown time  . Melatonin 3 MG CAPS Take 3 mg by mouth at bedtime as needed (SLEEP).   Past Week at Unknown time  . naproxen sodium (ANAPROX) 220 MG tablet Take 440 mg by mouth 2 (two) times daily as needed (PAIN).   Past Week at Unknown time  . omeprazole (PRILOSEC) 20 MG capsule Take 20 mg by mouth daily as needed. For indigestion   04/02/2016 at Unknown time  . Probiotic Product (PROBIOTIC  PO) Take 4 tablets by mouth 4 (four) times daily.   Past Week at Unknown time  . sodium chloride (OCEAN) 0.65 % SOLN nasal spray Place 1 spray into both nostrils 2 (two) times daily as needed for congestion.   Past Week at Unknown time  . Vitamin D, Ergocalciferol, (DRISDOL) 50000 UNITS CAPS Take 50,000 Units by mouth every 7 (seven) days. On Mondays   Past Week at Unknown time  . albuterol (PROVENTIL HFA;VENTOLIN HFA) 108 (90 BASE) MCG/ACT inhaler Inhale 2 puffs into the lungs every 6 (six) hours as needed for wheezing or shortness of breath. For shortness of breath    Unknown at Unknown time  . guaiFENesin (MUCINEX) 600 MG 12 hr tablet Take 600 mg by mouth 2 (two) times daily as needed for to loosen phlegm.   Unknown at Unknown time  . zoledronic acid  (RECLAST) 5 MG/100ML SOLN injection Inject 5 mg into the vein once. Last dose 10/17 yearly   Taking   Allergies  Allergen Reactions  . Amoxicillin Nausea And Vomiting     Has patient had a PCN reaction causing immediate rash, facial/tongue/throat swelling, SOB or lightheadedness with hypotension: #  #  #  YES  #  #  #  Has patient had a PCN reaction causing severe rash involving mucus membranes or skin necrosis: No Has patient had a PCN reaction that required hospitalization No Has patient had a PCN reaction occurring within the last 10 years: #  #  #  YES  #  #  #  If all of the above answers are "NO", then may proceed with Cephalosporin use.  . Augmentin [Amoxicillin-Pot Clavulanate] Nausea And Vomiting  . Demerol [Meperidine Hcl] Nausea And Vomiting  . Sulfa Antibiotics Nausea And Vomiting    Social History  Substance Use Topics  . Smoking status: Former Smoker    Quit date: 02/11/1990  . Smokeless tobacco: Never Used  . Alcohol use 8.4 oz/week    14 Standard drinks or equivalent per week     Comment: drink per nite scotch    Family History  Problem Relation Age of Onset  . Cancer Mother   . Breast cancer Mother   . Colon cancer Mother   . Pancreatic cancer Mother   . Cancer Maternal Grandmother      Review of Systems A comprehensive review of systems was negative.  Objective: Vital signs in last 24 hours: Temp:  [98.1 F (36.7 C)] 98.1 F (36.7 C) (02/21 0659) Pulse Rate:  [67] 67 (02/21 0659) Resp:  [18] 18 (02/21 0659) BP: (146)/(55) 146/55 (02/21 0659) SpO2:  [97 %] 97 % (02/21 0659) Weight:  [49 kg (108 lb)] 49 kg (108 lb) (02/21 0659)  EXAM: Patient well-developed well-nourished white female in no acute distress. Lungs are clear to auscultation , the patient has symmetrical respiratory excursion. Heart has a regular rate and rhythm normal S1 and S2 no murmur.   Abdomen is soft nontender nondistended bowel sounds are present. Extremity examination shows no  clubbing cyanosis or edema. Motor examination shows 5 over 5 strength in the upper extremities including the deltoid biceps triceps and intrinsics and grip. Sensation is intact to pinprick throughout the digits of the upper extremities. Reflexes are symmetrical and without evidence of pathologic reflexes. Patient has a normal gait and stance.   Data Review:CBC    Component Value Date/Time   WBC 6.1 03/27/2016 1146   RBC 4.08 03/27/2016 1146   HGB 14.0 03/27/2016 1146  HCT 42.2 03/27/2016 1146   PLT 328 03/27/2016 1146   MCV 103.4 (H) 03/27/2016 1146   MCV 105.8 (A) 02/12/2012 1140   MCH 34.3 (H) 03/27/2016 1146   MCHC 33.2 03/27/2016 1146   RDW 13.6 03/27/2016 1146   LYMPHSABS 1.9 11/02/2013 0432   MONOABS 0.5 11/02/2013 0432   EOSABS 0.1 11/02/2013 0432   BASOSABS 0.0 11/02/2013 0432                          BMET    Component Value Date/Time   NA 139 03/27/2016 1146   K 4.3 03/27/2016 1146   CL 101 03/27/2016 1146   CO2 25 03/27/2016 1146   GLUCOSE 86 03/27/2016 1146   BUN 10 03/27/2016 1146   CREATININE 0.65 03/27/2016 1146   CALCIUM 9.8 03/27/2016 1146   GFRNONAA >60 03/27/2016 1146   GFRAA >60 03/27/2016 1146     Assessment/Plan: Patient with right cervical radiculopathy secondary to large right 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  I've discussed with the patient the nature of his condition, the nature the surgical procedure, the typical length of surgery, hospital stay, and overall recuperation. We discussed limitations postoperatively. I discussed risks of surgery including risks of infection, bleeding, possibly need for transfusion, the risk of nerve root dysfunction with pain, weakness, numbness, or paresthesias, the risk of spinal cord dysfunction with paralysis of all 4 limbs and quadriplegia, and the risk of dural tear and CSF leakage and possible need for further surgery, the risk of esophageal dysfunction causing  dysphagia and the risk of laryngeal dysfunction causing hoarseness of the voice, the possible need for further surgery, and the risk of anesthetic complications including myocardial infarction, stroke, pneumonia, and death. We also discussed the need for postoperative immobilization in a cervical collar. Understanding all this the patient does wish to proceed with surgery and is admitted for such.    Hosie Spangle, MD 04/03/2016 8:11 AM

## 2016-04-03 NOTE — Transfer of Care (Signed)
Immediate Anesthesia Transfer of Care Note  Patient: Pamela Larson  Procedure(s) Performed: Procedure(s) with comments: CERVICAL SIX CERVICAL SEVEN [place] (N/A) - left side approach  Patient Location: PACU  Anesthesia Type:General  Level of Consciousness: awake, alert  and oriented  Airway & Oxygen Therapy: Patient Spontanous Breathing and Patient connected to nasal cannula oxygen  Post-op Assessment: Report given to RN and Post -op Vital signs reviewed and stable  Post vital signs: Reviewed and stable  Last Vitals:  Vitals:   04/03/16 0659 04/03/16 1047  BP: (!) 146/55   Pulse: 67   Resp: 18   Temp: 36.7 C 36.7 C    Last Pain:  Vitals:   04/03/16 0739  TempSrc:   PainSc: 1          Complications: No apparent anesthesia complications

## 2016-04-05 ENCOUNTER — Encounter (HOSPITAL_COMMUNITY): Payer: Self-pay | Admitting: Neurosurgery

## 2016-04-06 NOTE — Anesthesia Postprocedure Evaluation (Addendum)
Anesthesia Post Note  Patient: Sury Fidalgo Rigor  Procedure(s) Performed: Procedure(s) (LRB): CERVICAL SIX CERVICAL SEVEN [place] ARTHROPLASTY (N/A)  Patient location during evaluation: PACU Anesthesia Type: General Level of consciousness: awake and alert Pain management: pain level controlled Vital Signs Assessment: post-procedure vital signs reviewed and stable Respiratory status: spontaneous breathing, nonlabored ventilation, respiratory function stable and patient connected to nasal cannula oxygen Cardiovascular status: blood pressure returned to baseline and stable Postop Assessment: no signs of nausea or vomiting Anesthetic complications: no       Last Vitals:  Vitals:   04/03/16 1335 04/03/16 1739  BP: 123/66 (!) 134/53  Pulse: 81 74  Resp: 18 18  Temp: 36.4 C 37 C    Last Pain:  Vitals:   04/03/16 1245  TempSrc:   PainSc: 0-No pain                 Mittie Knittel

## 2016-04-19 DIAGNOSIS — Z9889 Other specified postprocedural states: Secondary | ICD-10-CM | POA: Diagnosis not present

## 2016-04-19 DIAGNOSIS — M5020 Other cervical disc displacement, unspecified cervical region: Secondary | ICD-10-CM | POA: Diagnosis not present

## 2016-04-26 DIAGNOSIS — H524 Presbyopia: Secondary | ICD-10-CM | POA: Diagnosis not present

## 2016-04-26 DIAGNOSIS — H52223 Regular astigmatism, bilateral: Secondary | ICD-10-CM | POA: Diagnosis not present

## 2016-04-26 DIAGNOSIS — H43813 Vitreous degeneration, bilateral: Secondary | ICD-10-CM | POA: Diagnosis not present

## 2016-04-26 DIAGNOSIS — H5203 Hypermetropia, bilateral: Secondary | ICD-10-CM | POA: Diagnosis not present

## 2016-04-26 DIAGNOSIS — Z961 Presence of intraocular lens: Secondary | ICD-10-CM | POA: Diagnosis not present

## 2016-05-20 ENCOUNTER — Encounter: Payer: Self-pay | Admitting: Podiatry

## 2016-06-21 DIAGNOSIS — M4312 Spondylolisthesis, cervical region: Secondary | ICD-10-CM | POA: Diagnosis not present

## 2016-06-21 DIAGNOSIS — Z9889 Other specified postprocedural states: Secondary | ICD-10-CM | POA: Diagnosis not present

## 2016-06-21 DIAGNOSIS — R030 Elevated blood-pressure reading, without diagnosis of hypertension: Secondary | ICD-10-CM | POA: Diagnosis not present

## 2016-06-21 DIAGNOSIS — M4722 Other spondylosis with radiculopathy, cervical region: Secondary | ICD-10-CM | POA: Diagnosis not present

## 2016-06-21 DIAGNOSIS — M5030 Other cervical disc degeneration, unspecified cervical region: Secondary | ICD-10-CM | POA: Diagnosis not present

## 2016-06-21 DIAGNOSIS — M542 Cervicalgia: Secondary | ICD-10-CM | POA: Diagnosis not present

## 2016-07-11 DIAGNOSIS — J0141 Acute recurrent pansinusitis: Secondary | ICD-10-CM | POA: Diagnosis not present

## 2016-07-15 NOTE — Addendum Note (Signed)
Addendum  created 07/15/16 1124 by Oleta Mouse, MD   Sign clinical note

## 2016-07-16 DIAGNOSIS — K121 Other forms of stomatitis: Secondary | ICD-10-CM | POA: Diagnosis not present

## 2016-07-16 DIAGNOSIS — M542 Cervicalgia: Secondary | ICD-10-CM | POA: Diagnosis not present

## 2016-09-10 DIAGNOSIS — B9689 Other specified bacterial agents as the cause of diseases classified elsewhere: Secondary | ICD-10-CM | POA: Diagnosis not present

## 2016-09-10 DIAGNOSIS — J019 Acute sinusitis, unspecified: Secondary | ICD-10-CM | POA: Diagnosis not present

## 2016-09-10 DIAGNOSIS — J22 Unspecified acute lower respiratory infection: Secondary | ICD-10-CM | POA: Diagnosis not present

## 2016-09-18 DIAGNOSIS — J22 Unspecified acute lower respiratory infection: Secondary | ICD-10-CM | POA: Diagnosis not present

## 2016-10-04 DIAGNOSIS — F329 Major depressive disorder, single episode, unspecified: Secondary | ICD-10-CM | POA: Diagnosis not present

## 2016-10-04 DIAGNOSIS — J22 Unspecified acute lower respiratory infection: Secondary | ICD-10-CM | POA: Diagnosis not present

## 2016-10-04 DIAGNOSIS — E039 Hypothyroidism, unspecified: Secondary | ICD-10-CM | POA: Diagnosis not present

## 2016-10-04 DIAGNOSIS — F411 Generalized anxiety disorder: Secondary | ICD-10-CM | POA: Diagnosis not present

## 2016-10-18 DIAGNOSIS — J22 Unspecified acute lower respiratory infection: Secondary | ICD-10-CM | POA: Diagnosis not present

## 2016-10-18 DIAGNOSIS — F411 Generalized anxiety disorder: Secondary | ICD-10-CM | POA: Diagnosis not present

## 2016-10-18 DIAGNOSIS — F329 Major depressive disorder, single episode, unspecified: Secondary | ICD-10-CM | POA: Diagnosis not present

## 2016-10-18 DIAGNOSIS — E039 Hypothyroidism, unspecified: Secondary | ICD-10-CM | POA: Diagnosis not present

## 2016-11-01 DIAGNOSIS — I10 Essential (primary) hypertension: Secondary | ICD-10-CM | POA: Diagnosis not present

## 2016-11-01 DIAGNOSIS — S0083XA Contusion of other part of head, initial encounter: Secondary | ICD-10-CM | POA: Diagnosis not present

## 2016-11-01 DIAGNOSIS — Z882 Allergy status to sulfonamides status: Secondary | ICD-10-CM | POA: Diagnosis not present

## 2016-11-01 DIAGNOSIS — F329 Major depressive disorder, single episode, unspecified: Secondary | ICD-10-CM | POA: Diagnosis not present

## 2016-11-01 DIAGNOSIS — Z885 Allergy status to narcotic agent status: Secondary | ICD-10-CM | POA: Diagnosis not present

## 2016-11-01 DIAGNOSIS — Z79899 Other long term (current) drug therapy: Secondary | ICD-10-CM | POA: Diagnosis not present

## 2016-11-01 DIAGNOSIS — Z880 Allergy status to penicillin: Secondary | ICD-10-CM | POA: Diagnosis not present

## 2016-11-01 DIAGNOSIS — W5501XA Bitten by cat, initial encounter: Secondary | ICD-10-CM | POA: Diagnosis not present

## 2016-11-01 DIAGNOSIS — Z87891 Personal history of nicotine dependence: Secondary | ICD-10-CM | POA: Diagnosis not present

## 2016-11-01 DIAGNOSIS — Z7951 Long term (current) use of inhaled steroids: Secondary | ICD-10-CM | POA: Diagnosis not present

## 2016-11-01 DIAGNOSIS — E079 Disorder of thyroid, unspecified: Secondary | ICD-10-CM | POA: Diagnosis not present

## 2016-11-01 DIAGNOSIS — S61551A Open bite of right wrist, initial encounter: Secondary | ICD-10-CM | POA: Diagnosis not present

## 2016-11-01 DIAGNOSIS — S0033XA Contusion of nose, initial encounter: Secondary | ICD-10-CM | POA: Diagnosis not present

## 2016-11-01 DIAGNOSIS — B370 Candidal stomatitis: Secondary | ICD-10-CM | POA: Diagnosis not present

## 2016-11-15 DIAGNOSIS — Z760 Encounter for issue of repeat prescription: Secondary | ICD-10-CM | POA: Diagnosis not present

## 2016-11-15 DIAGNOSIS — S61551A Open bite of right wrist, initial encounter: Secondary | ICD-10-CM | POA: Diagnosis not present

## 2016-11-15 DIAGNOSIS — W540XXA Bitten by dog, initial encounter: Secondary | ICD-10-CM | POA: Diagnosis not present

## 2016-11-15 DIAGNOSIS — L989 Disorder of the skin and subcutaneous tissue, unspecified: Secondary | ICD-10-CM | POA: Diagnosis not present

## 2016-11-22 DIAGNOSIS — Z803 Family history of malignant neoplasm of breast: Secondary | ICD-10-CM | POA: Diagnosis not present

## 2016-11-22 DIAGNOSIS — Z1231 Encounter for screening mammogram for malignant neoplasm of breast: Secondary | ICD-10-CM | POA: Diagnosis not present

## 2016-12-03 ENCOUNTER — Telehealth: Payer: Self-pay | Admitting: Podiatry

## 2016-12-03 NOTE — Telephone Encounter (Signed)
Hello Pamela Larson, I spoke to you in May I think about getting some records sent to me. I received them, I still have the x-ray. Somehow, the pages got pulled apart when I was looking at them and I didn't get them back together correctly. I've got parts of that says page 1 of 3 or like page 2. Could you please call me back and help me sort this out at [phone]. Thank you.

## 2016-12-05 ENCOUNTER — Telehealth: Payer: Self-pay | Admitting: Podiatry

## 2016-12-05 NOTE — Telephone Encounter (Signed)
Called pt back in regards to message she left the other day. Pt requested dates she was seen because she likes to keep a copy of her records for herself. I told the pt the dates she was seen were 01/27/2015, 02/24/2015, 03/09/2015, 04/06/2015, and 04/27/2015. Pt stated she did not have one of the notes but it was okay. I told pt to call us if she needed anything else.

## 2016-12-07 DIAGNOSIS — Z23 Encounter for immunization: Secondary | ICD-10-CM | POA: Diagnosis not present

## 2016-12-25 DIAGNOSIS — G5762 Lesion of plantar nerve, left lower limb: Secondary | ICD-10-CM | POA: Diagnosis not present

## 2016-12-25 DIAGNOSIS — M79672 Pain in left foot: Secondary | ICD-10-CM | POA: Diagnosis not present

## 2016-12-25 DIAGNOSIS — G8929 Other chronic pain: Secondary | ICD-10-CM | POA: Diagnosis not present

## 2016-12-28 DIAGNOSIS — H6981 Other specified disorders of Eustachian tube, right ear: Secondary | ICD-10-CM | POA: Diagnosis not present

## 2016-12-28 DIAGNOSIS — F411 Generalized anxiety disorder: Secondary | ICD-10-CM | POA: Diagnosis not present

## 2016-12-28 DIAGNOSIS — H9201 Otalgia, right ear: Secondary | ICD-10-CM | POA: Diagnosis not present

## 2016-12-28 DIAGNOSIS — M2669 Other specified disorders of temporomandibular joint: Secondary | ICD-10-CM | POA: Diagnosis not present

## 2017-01-01 DIAGNOSIS — R82998 Other abnormal findings in urine: Secondary | ICD-10-CM | POA: Diagnosis not present

## 2017-01-01 DIAGNOSIS — M810 Age-related osteoporosis without current pathological fracture: Secondary | ICD-10-CM | POA: Diagnosis not present

## 2017-01-01 DIAGNOSIS — E038 Other specified hypothyroidism: Secondary | ICD-10-CM | POA: Diagnosis not present

## 2017-01-01 DIAGNOSIS — D510 Vitamin B12 deficiency anemia due to intrinsic factor deficiency: Secondary | ICD-10-CM | POA: Diagnosis not present

## 2017-01-08 DIAGNOSIS — D472 Monoclonal gammopathy: Secondary | ICD-10-CM | POA: Diagnosis not present

## 2017-01-08 DIAGNOSIS — E559 Vitamin D deficiency, unspecified: Secondary | ICD-10-CM | POA: Diagnosis not present

## 2017-01-08 DIAGNOSIS — M797 Fibromyalgia: Secondary | ICD-10-CM | POA: Diagnosis not present

## 2017-01-08 DIAGNOSIS — M0600 Rheumatoid arthritis without rheumatoid factor, unspecified site: Secondary | ICD-10-CM | POA: Diagnosis not present

## 2017-01-08 DIAGNOSIS — R5382 Chronic fatigue, unspecified: Secondary | ICD-10-CM | POA: Diagnosis not present

## 2017-01-08 DIAGNOSIS — M810 Age-related osteoporosis without current pathological fracture: Secondary | ICD-10-CM | POA: Diagnosis not present

## 2017-01-08 DIAGNOSIS — E038 Other specified hypothyroidism: Secondary | ICD-10-CM | POA: Diagnosis not present

## 2017-01-08 DIAGNOSIS — Z1389 Encounter for screening for other disorder: Secondary | ICD-10-CM | POA: Diagnosis not present

## 2017-01-08 DIAGNOSIS — Z0000 Encounter for general adult medical examination without abnormal findings: Secondary | ICD-10-CM | POA: Diagnosis not present

## 2017-01-08 DIAGNOSIS — D510 Vitamin B12 deficiency anemia due to intrinsic factor deficiency: Secondary | ICD-10-CM | POA: Diagnosis not present

## 2017-01-08 DIAGNOSIS — Z681 Body mass index (BMI) 19 or less, adult: Secondary | ICD-10-CM | POA: Diagnosis not present

## 2017-01-13 DIAGNOSIS — M810 Age-related osteoporosis without current pathological fracture: Secondary | ICD-10-CM | POA: Diagnosis not present

## 2017-03-17 DIAGNOSIS — L821 Other seborrheic keratosis: Secondary | ICD-10-CM | POA: Diagnosis not present

## 2017-03-17 DIAGNOSIS — D485 Neoplasm of uncertain behavior of skin: Secondary | ICD-10-CM | POA: Diagnosis not present

## 2017-03-17 DIAGNOSIS — D2271 Melanocytic nevi of right lower limb, including hip: Secondary | ICD-10-CM | POA: Diagnosis not present

## 2017-03-17 DIAGNOSIS — D1801 Hemangioma of skin and subcutaneous tissue: Secondary | ICD-10-CM | POA: Diagnosis not present

## 2017-03-17 DIAGNOSIS — D225 Melanocytic nevi of trunk: Secondary | ICD-10-CM | POA: Diagnosis not present

## 2017-04-09 ENCOUNTER — Other Ambulatory Visit: Payer: Self-pay | Admitting: Orthopedic Surgery

## 2017-05-05 ENCOUNTER — Other Ambulatory Visit: Payer: Self-pay

## 2017-05-05 ENCOUNTER — Encounter (HOSPITAL_BASED_OUTPATIENT_CLINIC_OR_DEPARTMENT_OTHER): Payer: Self-pay | Admitting: *Deleted

## 2017-05-05 DIAGNOSIS — M25512 Pain in left shoulder: Secondary | ICD-10-CM | POA: Diagnosis not present

## 2017-05-08 ENCOUNTER — Encounter (HOSPITAL_BASED_OUTPATIENT_CLINIC_OR_DEPARTMENT_OTHER): Payer: Self-pay | Admitting: Anesthesiology

## 2017-05-08 ENCOUNTER — Ambulatory Visit (HOSPITAL_BASED_OUTPATIENT_CLINIC_OR_DEPARTMENT_OTHER): Payer: Medicare Other | Admitting: Anesthesiology

## 2017-05-08 ENCOUNTER — Ambulatory Visit (HOSPITAL_BASED_OUTPATIENT_CLINIC_OR_DEPARTMENT_OTHER)
Admission: RE | Admit: 2017-05-08 | Discharge: 2017-05-08 | Disposition: A | Discharge status: Home / Self Care | Payer: Medicare Other | Source: Ambulatory Visit | Attending: Orthopedic Surgery | Admitting: Orthopedic Surgery

## 2017-05-08 ENCOUNTER — Other Ambulatory Visit: Payer: Self-pay

## 2017-05-08 ENCOUNTER — Encounter (HOSPITAL_BASED_OUTPATIENT_CLINIC_OR_DEPARTMENT_OTHER)
Admission: RE | Disposition: A | Discharge status: Home / Self Care | Payer: Self-pay | Source: Ambulatory Visit | Attending: Orthopedic Surgery

## 2017-05-08 DIAGNOSIS — Z87891 Personal history of nicotine dependence: Secondary | ICD-10-CM | POA: Diagnosis not present

## 2017-05-08 DIAGNOSIS — K219 Gastro-esophageal reflux disease without esophagitis: Secondary | ICD-10-CM | POA: Diagnosis not present

## 2017-05-08 DIAGNOSIS — F419 Anxiety disorder, unspecified: Secondary | ICD-10-CM | POA: Insufficient documentation

## 2017-05-08 DIAGNOSIS — G5762 Lesion of plantar nerve, left lower limb: Secondary | ICD-10-CM | POA: Diagnosis not present

## 2017-05-08 DIAGNOSIS — M797 Fibromyalgia: Secondary | ICD-10-CM | POA: Insufficient documentation

## 2017-05-08 DIAGNOSIS — R5382 Chronic fatigue, unspecified: Secondary | ICD-10-CM | POA: Diagnosis not present

## 2017-05-08 DIAGNOSIS — Z981 Arthrodesis status: Secondary | ICD-10-CM | POA: Insufficient documentation

## 2017-05-08 DIAGNOSIS — E039 Hypothyroidism, unspecified: Secondary | ICD-10-CM | POA: Diagnosis not present

## 2017-05-08 DIAGNOSIS — Z885 Allergy status to narcotic agent status: Secondary | ICD-10-CM | POA: Diagnosis not present

## 2017-05-08 DIAGNOSIS — M069 Rheumatoid arthritis, unspecified: Secondary | ICD-10-CM | POA: Diagnosis not present

## 2017-05-08 DIAGNOSIS — Z880 Allergy status to penicillin: Secondary | ICD-10-CM | POA: Insufficient documentation

## 2017-05-08 DIAGNOSIS — F329 Major depressive disorder, single episode, unspecified: Secondary | ICD-10-CM | POA: Diagnosis not present

## 2017-05-08 DIAGNOSIS — Z882 Allergy status to sulfonamides status: Secondary | ICD-10-CM | POA: Insufficient documentation

## 2017-05-08 DIAGNOSIS — N189 Chronic kidney disease, unspecified: Secondary | ICD-10-CM | POA: Diagnosis not present

## 2017-05-08 DIAGNOSIS — Z79899 Other long term (current) drug therapy: Secondary | ICD-10-CM | POA: Insufficient documentation

## 2017-05-08 DIAGNOSIS — Z87442 Personal history of urinary calculi: Secondary | ICD-10-CM | POA: Diagnosis not present

## 2017-05-08 DIAGNOSIS — M810 Age-related osteoporosis without current pathological fracture: Secondary | ICD-10-CM | POA: Diagnosis not present

## 2017-05-08 DIAGNOSIS — D3613 Benign neoplasm of peripheral nerves and autonomic nervous system of lower limb, including hip: Secondary | ICD-10-CM

## 2017-05-08 HISTORY — PX: EXCISION MORTON'S NEUROMA: SHX5013

## 2017-05-08 SURGERY — EXCISION, MORTON'S NEUROMA
Anesthesia: General | Site: Foot | Laterality: Left

## 2017-05-08 MED ORDER — FENTANYL CITRATE (PF) 100 MCG/2ML IJ SOLN
INTRAMUSCULAR | Status: AC
  Filled 2017-05-08: qty 2

## 2017-05-08 MED ORDER — ONDANSETRON HCL 4 MG/2ML IJ SOLN
INTRAMUSCULAR | Status: DC | PRN
  Administered 2017-05-08: 4 mg via INTRAVENOUS

## 2017-05-08 MED ORDER — TRAMADOL HCL 50 MG PO TABS
ORAL_TABLET | ORAL | Status: AC
  Filled 2017-05-08: qty 1

## 2017-05-08 MED ORDER — MIDAZOLAM HCL 2 MG/2ML IJ SOLN
INTRAMUSCULAR | Status: AC
  Filled 2017-05-08: qty 2

## 2017-05-08 MED ORDER — DOCUSATE SODIUM 100 MG PO CAPS: 100.0000 mg | ORAL_CAPSULE | Freq: Two times a day (BID) | ORAL | 0 refills | Status: DC

## 2017-05-08 MED ORDER — VANCOMYCIN HCL IN DEXTROSE 1-5 GM/200ML-% IV SOLN
INTRAVENOUS | Status: AC
  Filled 2017-05-08: qty 200

## 2017-05-08 MED ORDER — CHLORHEXIDINE GLUCONATE 4 % EX LIQD: 60.0000 mL | Freq: Once | CUTANEOUS | Status: DC

## 2017-05-08 MED ORDER — BUPIVACAINE HCL (PF) 0.25 % IJ SOLN
INTRAMUSCULAR | Status: AC
Start: 2017-05-08 — End: ?
  Filled 2017-05-08: qty 30

## 2017-05-08 MED ORDER — MIDAZOLAM HCL 2 MG/2ML IJ SOLN
1.0000 mg | INTRAMUSCULAR | Status: DC | PRN
  Administered 2017-05-08: 1 mg via INTRAVENOUS

## 2017-05-08 MED ORDER — TRAMADOL HCL 50 MG PO TABS: 50.0000 mg | ORAL_TABLET | Freq: Four times a day (QID) | ORAL | 0 refills | Status: DC | PRN

## 2017-05-08 MED ORDER — SODIUM CHLORIDE 0.9 % IV SOLN: INTRAVENOUS | Status: DC

## 2017-05-08 MED ORDER — TRAMADOL HCL 50 MG PO TABS
50.0000 mg | ORAL_TABLET | Freq: Once | ORAL | Status: AC
  Administered 2017-05-08: 50 mg via ORAL

## 2017-05-08 MED ORDER — FENTANYL CITRATE (PF) 100 MCG/2ML IJ SOLN
25.0000 ug | INTRAMUSCULAR | Status: DC | PRN
  Administered 2017-05-08 (×2): 50 ug via INTRAVENOUS

## 2017-05-08 MED ORDER — VANCOMYCIN HCL IN DEXTROSE 1-5 GM/200ML-% IV SOLN
1000.0000 mg | INTRAVENOUS | Status: AC
  Administered 2017-05-08 (×2): 1000 mg via INTRAVENOUS

## 2017-05-08 MED ORDER — ONDANSETRON HCL 4 MG/2ML IJ SOLN
INTRAMUSCULAR | Status: AC
Start: 2017-05-08 — End: ?
  Filled 2017-05-08: qty 2

## 2017-05-08 MED ORDER — FENTANYL CITRATE (PF) 100 MCG/2ML IJ SOLN
50.0000 ug | INTRAMUSCULAR | Status: DC | PRN
  Administered 2017-05-08: 50 ug via INTRAVENOUS

## 2017-05-08 MED ORDER — LIDOCAINE HCL (PF) 1 % IJ SOLN
INTRAMUSCULAR | Status: AC
  Filled 2017-05-08: qty 30

## 2017-05-08 MED ORDER — BUPIVACAINE-EPINEPHRINE (PF) 0.5% -1:200000 IJ SOLN
INTRAMUSCULAR | Status: DC | PRN
  Administered 2017-05-08: 10 mL via PERINEURAL

## 2017-05-08 MED ORDER — PROPOFOL 10 MG/ML IV BOLUS
INTRAVENOUS | Status: DC | PRN
  Administered 2017-05-08: 80 mg via INTRAVENOUS
  Administered 2017-05-08: 120 mg via INTRAVENOUS

## 2017-05-08 MED ORDER — LIDOCAINE HCL (CARDIAC) 20 MG/ML IV SOLN
INTRAVENOUS | Status: DC | PRN
  Administered 2017-05-08: 25 mg via INTRAVENOUS

## 2017-05-08 MED ORDER — DEXAMETHASONE SODIUM PHOSPHATE 4 MG/ML IJ SOLN
INTRAMUSCULAR | Status: DC | PRN
  Administered 2017-05-08: 10 mg via INTRAVENOUS

## 2017-05-08 MED ORDER — SENNA 8.6 MG PO TABS: 2.0000 | ORAL_TABLET | Freq: Two times a day (BID) | ORAL | 0 refills | Status: DC

## 2017-05-08 MED ORDER — LACTATED RINGERS IV SOLN
INTRAVENOUS | Status: DC
  Administered 2017-05-08: 08:00:00 via INTRAVENOUS

## 2017-05-08 MED ORDER — DEXAMETHASONE SODIUM PHOSPHATE 10 MG/ML IJ SOLN
INTRAMUSCULAR | Status: AC
  Filled 2017-05-08: qty 1

## 2017-05-08 MED ORDER — SCOPOLAMINE 1 MG/3DAYS TD PT72: 1.0000 | MEDICATED_PATCH | Freq: Once | TRANSDERMAL | Status: DC | PRN

## 2017-05-08 MED ORDER — PROPOFOL 500 MG/50ML IV EMUL
INTRAVENOUS | Status: AC
  Filled 2017-05-08: qty 50

## 2017-05-08 MED ORDER — LIDOCAINE HCL (CARDIAC) 20 MG/ML IV SOLN
INTRAVENOUS | Status: AC
  Filled 2017-05-08: qty 5

## 2017-05-08 MED ORDER — ONDANSETRON HCL 4 MG/2ML IJ SOLN: 4.0000 mg | Freq: Once | INTRAMUSCULAR | Status: DC | PRN

## 2017-05-08 SURGICAL SUPPLY — 55 items
BANDAGE ESMARK 6X9 LF (GAUZE/BANDAGES/DRESSINGS) IMPLANT
BLADE SURG 15 STRL LF DISP TIS (BLADE) ×2 IMPLANT
BLADE SURG 15 STRL SS (BLADE) ×6
BNDG CMPR 9X6 STRL LF SNTH (GAUZE/BANDAGES/DRESSINGS)
BNDG COHESIVE 4X5 TAN STRL (GAUZE/BANDAGES/DRESSINGS) ×3 IMPLANT
BNDG CONFORM 2 STRL LF (GAUZE/BANDAGES/DRESSINGS) ×3 IMPLANT
BNDG ESMARK 6X9 LF (GAUZE/BANDAGES/DRESSINGS)
CHLORAPREP W/TINT 26ML (MISCELLANEOUS) ×3 IMPLANT
CLOSURE WOUND 1/2 X4 (GAUZE/BANDAGES/DRESSINGS)
CORD BIPOLAR FORCEPS 12FT (ELECTRODE) ×3 IMPLANT
COVER BACK TABLE 60X90IN (DRAPES) ×3 IMPLANT
CUFF TOURNIQUET SINGLE 18IN (TOURNIQUET CUFF) IMPLANT
CUFF TOURNIQUET SINGLE 24IN (TOURNIQUET CUFF) IMPLANT
DRAPE EXTREMITY T 121X128X90 (DRAPE) ×3 IMPLANT
DRAPE SURG 17X23 STRL (DRAPES) ×3 IMPLANT
DRAPE U-SHAPE 47X51 STRL (DRAPES) IMPLANT
DRSG MEPITEL 4X7.2 (GAUZE/BANDAGES/DRESSINGS) ×3 IMPLANT
DRSG PAD ABDOMINAL 8X10 ST (GAUZE/BANDAGES/DRESSINGS) IMPLANT
ELECT REM PT RETURN 9FT ADLT (ELECTROSURGICAL) ×3
ELECTRODE REM PT RTRN 9FT ADLT (ELECTROSURGICAL) ×1 IMPLANT
GAUZE SPONGE 4X4 12PLY STRL (GAUZE/BANDAGES/DRESSINGS) ×3 IMPLANT
GLOVE BIO SURGEON STRL SZ 6.5 (GLOVE) ×2 IMPLANT
GLOVE BIO SURGEON STRL SZ8 (GLOVE) ×3 IMPLANT
GLOVE BIO SURGEONS STRL SZ 6.5 (GLOVE) ×2
GLOVE BIOGEL PI IND STRL 8 (GLOVE) ×2 IMPLANT
GLOVE BIOGEL PI INDICATOR 8 (GLOVE) ×4
GLOVE ECLIPSE 8.0 STRL XLNG CF (GLOVE) ×3 IMPLANT
GOWN STRL REUS W/ TWL LRG LVL3 (GOWN DISPOSABLE) ×1 IMPLANT
GOWN STRL REUS W/ TWL XL LVL3 (GOWN DISPOSABLE) ×2 IMPLANT
GOWN STRL REUS W/TWL LRG LVL3 (GOWN DISPOSABLE) ×3
GOWN STRL REUS W/TWL XL LVL3 (GOWN DISPOSABLE) ×6
NDL HYPO 25X1 1.5 SAFETY (NEEDLE) IMPLANT
NEEDLE HYPO 25X1 1.5 SAFETY (NEEDLE) ×3 IMPLANT
NS IRRIG 1000ML POUR BTL (IV SOLUTION) ×3 IMPLANT
PACK BASIN DAY SURGERY FS (CUSTOM PROCEDURE TRAY) ×3 IMPLANT
PAD CAST 4YDX4 CTTN HI CHSV (CAST SUPPLIES) ×1 IMPLANT
PADDING CAST COTTON 4X4 STRL (CAST SUPPLIES) ×3
PENCIL BUTTON HOLSTER BLD 10FT (ELECTRODE) ×3 IMPLANT
SANITIZER HAND PURELL 535ML FO (MISCELLANEOUS) ×3 IMPLANT
SHEET MEDIUM DRAPE 40X70 STRL (DRAPES) ×3 IMPLANT
SLEEVE SCD COMPRESS KNEE MED (MISCELLANEOUS) ×3 IMPLANT
SPONGE LAP 18X18 RF (DISPOSABLE) ×3 IMPLANT
STOCKINETTE 6  STRL (DRAPES) ×2
STOCKINETTE 6 STRL (DRAPES) ×1 IMPLANT
STRIP CLOSURE SKIN 1/2X4 (GAUZE/BANDAGES/DRESSINGS) IMPLANT
SUCTION FRAZIER HANDLE 10FR (MISCELLANEOUS) ×2
SUCTION TUBE FRAZIER 10FR DISP (MISCELLANEOUS) IMPLANT
SUT ETHILON 3 0 PS 1 (SUTURE) ×3 IMPLANT
SUT MNCRL AB 3-0 PS2 18 (SUTURE) ×2 IMPLANT
SYR BULB 3OZ (MISCELLANEOUS) ×3 IMPLANT
SYR CONTROL 10ML LL (SYRINGE) ×3 IMPLANT
TOWEL OR 17X24 6PK STRL BLUE (TOWEL DISPOSABLE) ×3 IMPLANT
TUBE CONNECTING 20'X1/4 (TUBING) ×1
TUBE CONNECTING 20X1/4 (TUBING) ×1 IMPLANT
UNDERPAD 30X30 (UNDERPADS AND DIAPERS) ×3 IMPLANT

## 2017-05-08 NOTE — Anesthesia Postprocedure Evaluation (Signed)
Anesthesia Post Note  Patient: Pamela Larson  Procedure(s) Performed: EXCISION MORTON'S NEUROMA Left 3rd webspace (Left Foot)     Patient location during evaluation: PACU Anesthesia Type: General Level of consciousness: awake and alert Pain management: pain level controlled Vital Signs Assessment: post-procedure vital signs reviewed and stable Respiratory status: spontaneous breathing, nonlabored ventilation and respiratory function stable Cardiovascular status: blood pressure returned to baseline and stable Postop Assessment: no apparent nausea or vomiting Anesthetic complications: no    Last Vitals:  Vitals:   05/08/17 1030 05/08/17 1045  BP: 123/60 131/60  Pulse: 71 79  Resp: 18 16  Temp:  37.1 C  SpO2: 98% 98%    Last Pain:  Vitals:   05/08/17 1045  TempSrc:   PainSc: 4                  Catalina Gravel

## 2017-05-08 NOTE — Anesthesia Preprocedure Evaluation (Signed)
Anesthesia Evaluation  Patient identified by MRN, date of birth, ID band Patient awake    Reviewed: Allergy & Precautions, NPO status , Patient's Chart, lab work & pertinent test results  Airway Mallampati: II  TM Distance: >3 FB Neck ROM: Full    Dental  (+) Teeth Intact, Dental Advisory Given   Pulmonary Recent URI , Residual Cough, former smoker,    Pulmonary exam normal breath sounds clear to auscultation       Cardiovascular negative cardio ROS Normal cardiovascular exam Rhythm:Regular Rate:Normal     Neuro/Psych PSYCHIATRIC DISORDERS Anxiety Depression  Neuromuscular disease    GI/Hepatic Neg liver ROS, GERD  Medicated,  Endo/Other  Hypothyroidism   Renal/GU Renal InsufficiencyRenal disease Bladder dysfunction      Musculoskeletal  (+) Arthritis , Rheumatoid disorders,  Fibromyalgia -  Abdominal   Peds  Hematology  (+) Blood dyscrasia, anemia ,   Anesthesia Other Findings Day of surgery medications reviewed with the patient.  Reproductive/Obstetrics                             Anesthesia Physical Anesthesia Plan  ASA: III  Anesthesia Plan: General   Post-op Pain Management:    Induction: Intravenous  PONV Risk Score and Plan: 3 and Midazolam, Dexamethasone and Ondansetron  Airway Management Planned: LMA  Additional Equipment:   Intra-op Plan:   Post-operative Plan: Extubation in OR  Informed Consent: I have reviewed the patients History and Physical, chart, labs and discussed the procedure including the risks, benefits and alternatives for the proposed anesthesia with the patient or authorized representative who has indicated his/her understanding and acceptance.   Dental advisory given  Plan Discussed with: CRNA  Anesthesia Plan Comments:         Anesthesia Quick Evaluation

## 2017-05-08 NOTE — H&P (Signed)
Pamela Larson is an 72 y.o. female.   Chief Complaint: left foot pain HPI: 72 y/o female with chronic left foot pain from a 3rd webspace Morton's neuroma.  She has failed nonoperative treatment to date including activity modification, shoewear modification and oral anti-inflammatories.  She presents today for operative treatment of this painful condition.  Past Medical History:  Diagnosis Date  . Anemia    pernicious anemia - 1991   . Anxiety   . Arthritis    rheumatoid arthritis   . Chronic fatigue   . Cystitis, interstitial   . Depression   . Disc disorder of lumbar region 2013  . Fibromyalgia   . GERD (gastroesophageal reflux disease)   . History of kidney stones   . Hypothyroidism   . Lyme disease    hx of possible   . 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 Osteoporosis   . Pneumonia    hx of   . Rheumatoid arthritis [facility])     Past Surgical History:  Procedure Laterality Date  . APPENDECTOMY  1978  . bladder hydrodistention  1998  . BREAST SURGERY     reduction   . CERVICAL DISC ARTHROPLASTY N/A 04/03/2016   Procedure: CERVICAL SIX CERVICAL SEVEN [place] ARTHROPLASTY;  Surgeon: Jovita Gamma, MD;  Location: [place];  Service: Neurosurgery;  Laterality: N/A;  left side approach  . COSMETIC SURGERY  2005   face lift  . EYE SURGERY    . LUMBAR LAMINECTOMY/DECOMPRESSION MICRODISCECTOMY  06/10/2011   Procedure: LUMBAR LAMINECTOMY/DECOMPRESSION MICRODISCECTOMY 1 LEVEL;  Surgeon: Hosie Spangle, MD;  Location: [place] NEURO ORS;  Service: Neurosurgery;  Laterality: Left;  Lumbar Five Sacral One Lumbar Laminectomy Decompression Microdiscectomy     Family History  Problem Relation Age of Onset  . Cancer Mother   . Breast cancer Mother   . Colon cancer Mother   . Pancreatic cancer Mother   . Cancer Maternal Grandmother    Social History:  reports that she quit smoking about 27 years ago. She has never used smokeless tobacco. She  reports that she drinks about 8.4 oz of alcohol per week. She reports that she does not use drugs.  Allergies:  Allergies  Allergen Reactions  . Amoxicillin Nausea And Vomiting     Has patient had a PCN reaction causing immediate rash, facial/tongue/throat swelling, SOB or lightheadedness with hypotension: #  #  #  YES  #  #  #  Has patient had a PCN reaction causing severe rash involving mucus membranes or skin necrosis: No Has patient had a PCN reaction that required hospitalization No Has patient had a PCN reaction occurring within the last 10 years: #  #  #  YES  #  #  #  If all of the above answers are "NO", then may proceed with Cephalosporin use.  . Augmentin [Amoxicillin-Pot Clavulanate] Nausea And Vomiting  . Demerol [Meperidine Hcl] Nausea And Vomiting  . Sulfa Antibiotics Nausea And Vomiting    Medications Prior to Admission  Medication Sig Dispense Refill  . acetaminophen (TYLENOL) 500 MG tablet Take 1,000 mg by mouth every 6 (six) hours as needed for mild pain. For pain    . albuterol (PROVENTIL HFA;VENTOLIN HFA) 108 (90 BASE) MCG/ACT inhaler Inhale 2 puffs into the lungs every 6 (six) hours as needed for wheezing or shortness of breath. For shortness of breath     . ALPRAZolam (XANAX) 0.25 MG  tablet Take 0.125 mg by mouth daily as needed for anxiety. Pt takes 1/2 tablet as needed     . calcium carbonate (OS-CAL - DOSED IN MG OF ELEMENTAL CALCIUM) 1250 MG tablet Take 1 tablet by mouth daily.    . fluticasone (FLONASE) 50 MCG/ACT nasal spray Place 1 spray into both nostrils daily as needed for allergies or rhinitis.    Marland Kitchen guaiFENesin (MUCINEX) 600 MG 12 hr tablet Take 600 mg by mouth 2 (two) times daily as needed for to loosen phlegm.    Marland Kitchen levothyroxine (SYNTHROID, LEVOTHROID) 100 MCG tablet Take 50-100 mcg by mouth See admin instructions. TAKES 100 MCG DAILY EXCEPT ON THURSDAYS AND TAKES 50 MCG    . Melatonin 3 MG CAPS Take 3 mg by mouth at bedtime as needed (SLEEP).    .  naproxen sodium (ANAPROX) 220 MG tablet Take 440 mg by mouth 2 (two) times daily as needed (PAIN).    Marland Kitchen nystatin (MYCOSTATIN) 100000 UNIT/ML suspension Take 5 mLs (500,000 Units total) by mouth 4 (four) times daily. 120 mL 0  . omeprazole (PRILOSEC) 20 MG capsule Take 20 mg by mouth daily as needed. For indigestion    . Probiotic Product (PROBIOTIC PO) Take 4 tablets by mouth 4 (four) times daily.    . sodium chloride (OCEAN) 0.65 % SOLN nasal spray Place 1 spray into both nostrils 2 (two) times daily as needed for congestion.    . Vitamin D, Ergocalciferol, (DRISDOL) 50000 UNITS CAPS Take 50,000 Units by mouth every 7 (seven) days. On Mondays      No results found for this or any previous visit (from the past 48 hour(s)). No results found.  ROS no recent fever, chills, nausea, vomiting or changes in her appetite  Blood pressure 132/64, pulse 73, temperature 98.6 F (37 C), temperature source Oral, resp. rate 20, height 5' (1.524 m), weight 44.4 kg (97 lb 12.8 oz), SpO2 95 %. Physical Exam  Well-nourished well-developed woman in no apparent distress.  Alert and oriented x4.  Mood and affect are normal.  Extraocular motions are intact.  Respirations are unlabored.  Gait is normal.  Left foot has healthy and intact skin.  Tender to palpation at the third webspace.  5 out of 5 strength in plantar flexion and dorsi flexion of the toes.  Sensibility to light touch is intact in the deep and superficial peroneal nerve distribution, medial and lateral plantar nerve distribution  Assessment/Plan Left third webspace Morton's neuroma -to the operating room today for third webspace Morton's neuroma excision from the left foot.  The risks and benefits of the alternative treatment options have been discussed in detail.  The patient wishes to proceed with surgery and specifically understands risks of bleeding, infection, nerve damage, blood clots, need for additional surgery, amputation and death.   [name], MD Jun 02, 2017, 8:49 AM

## 2017-05-08 NOTE — Discharge Instructions (Addendum)
[name], MD [place]  Please read the following information regarding your care after surgery.  Medications  You only need a prescription for the narcotic pain medicine (ex. oxycodone, Percocet, Norco).  All of the other medicines listed below are available over the counter. X Aleve 2 pills twice a day for the first 3 days after surgery. X acetominophen (Tylenol) 650 mg every 4-6 hours as you need for minor to moderate pain X tramadol as prescribed for severe pain  Narcotic pain medicine (ex. oxycodone, Percocet, Vicodin) will cause constipation.  To prevent this problem, take the following medicines while you are taking any pain medicine. X docusate sodium (Colace) 100 mg twice a day X senna (Senokot) 2 tablets twice a day   Weight Bearing X Bear weight when you are able on your operated leg or foot in the flat post-op shoe.  Cast / Splint / Dressing X Remove your dressing 3 days after surgery and cover the incisions with dry dressings.    After your dressing, cast or splint is removed; you may shower, but do not soak or scrub the wound.  Allow the water to run over it, and then gently pat it dry.  Swelling It is normal for you to have swelling where you had surgery.  To reduce swelling and pain, keep your toes above your nose for at least 3 days after surgery.  It may be necessary to keep your foot or leg elevated for several weeks.  If it hurts, it should be elevated.  Follow Up Call my office at [phone] when you are discharged from the hospital or surgery center to schedule an appointment to be seen two weeks after surgery.  Call my office at [phone] if you develop a fever >101.5 F, nausea, vomiting, bleeding from the surgical site or severe pain.      Post Anesthesia Home Care Instructions  Activity: Get plenty of rest for the remainder of the day. A responsible individual must stay with you for 24 hours following the procedure.  For the next 24  hours, DO NOT: -Drive a car -Paediatric nurse -Drink alcoholic beverages -Take any medication unless instructed by your physician -Make any legal decisions or sign important papers.  Meals: Start with liquid foods such as gelatin or soup. Progress to regular foods as tolerated. Avoid greasy, spicy, heavy foods. If nausea and/or vomiting occur, drink only clear liquids until the nausea and/or vomiting subsides. Call your physician if vomiting continues.  Special Instructions/Symptoms: Your throat may feel dry or sore from the anesthesia or the breathing tube placed in your throat during surgery. If this causes discomfort, gargle with warm salt water. The discomfort should disappear within 24 hours.  If you had a scopolamine patch placed behind your ear for the management of post- operative nausea and/or vomiting:  1. The medication in the patch is effective for 72 hours, after which it should be removed.  Wrap patch in a tissue and discard in the trash. Wash hands thoroughly with soap and water. 2. You may remove the patch earlier than 72 hours if you experience unpleasant side effects which may include dry mouth, dizziness or visual disturbances. 3. Avoid touching the patch. Wash your hands with soap and water after contact with the patch.

## 2017-05-08 NOTE — Anesthesia Procedure Notes (Signed)
Procedure Name: LMA Insertion Date/Time: 05/08/2017 8:56 AM Performed by: Lyndee Leo, CRNA Pre-anesthesia Checklist: Patient identified, Emergency Drugs available, Suction available and Patient being monitored Patient Re-evaluated:Patient Re-evaluated prior to induction Oxygen Delivery Method: Circle system utilized Preoxygenation: Pre-oxygenation with 100% oxygen Induction Type: IV induction Ventilation: Mask ventilation without difficulty LMA: LMA inserted LMA Size: 3.0 Number of attempts: 1 Airway Equipment and Method: Bite block Placement Confirmation: positive ETCO2 Tube secured with: Tape Dental Injury: Teeth and Oropharynx as per pre-operative assessment

## 2017-05-08 NOTE — Op Note (Signed)
05/08/2017  9:40 AM  PATIENT:  Pamela Larson  72 y.o. female  PRE-OPERATIVE DIAGNOSIS:  Left 3rd webspace Morton's neuroma  POST-OPERATIVE DIAGNOSIS:  Left 3rd webspace Morton's neuroma  Procedure(s): EXCISION MORTON'S NEUROMA Left 3rd webspace  SURGEON:  [name], MD  ASSISTANT: Mechele Claude, PA-C  ANESTHESIA:   General  EBL:  minimal   TOURNIQUET:   Total Tourniquet Time Documented: Calf (Left) - 25 minutes Total: Calf (Left) - 25 minutes  COMPLICATIONS:  None apparent  DISPOSITION:  Extubated, awake and stable to recovery.  INDICATION FOR PROCEDURE: The patient is a 72 year old female without significant past medical history.  She has a long history of left forefoot pain due to a third webspace Morton's neuroma.  She has failed nonoperative treatment to date including activity modification, oral anti-inflammatories and shoe wear modification.  She presents now for operative treatment of this painful and limiting condition.The risks and benefits of the alternative treatment options have been discussed in detail.  The patient wishes to proceed with surgery and specifically understands risks of bleeding, infection, nerve damage, blood clots, need for additional surgery, amputation and death.  PROCEDURE IN DETAIL:  After pre operative consent was obtained, and the correct operative site was identified, the patient was brought to the operating room and placed supine on the OR table.  Anesthesia was administered.  Pre-operative antibiotics were administered.  A surgical timeout was taken.  The left lower extremity was prepped and draped in standard sterile fashion with a tourniquet around the calf.  The extremity was elevated and the tourniquet was inflated to 200 mmHg.  A longitudinal incision was made over the dorsum of the third webspace.  Dissection was carried down through the subcutaneous tissues.  The intermetatarsal ligament was identified.  It was divided under direct  vision.  The interdigital nerve was identified.  It was dissected free from the surrounding soft tissues.  It appeared quite thickened distally with an hourglass appearance proximal to the bifurcation.  The nerve was dissected proximally and transected at the level of the interosseous muscles and allowed to retract into the muscles.  It was then traced distally past the bifurcation and transected at the third and fourth toes.  It was removed in its entirety.  The wound was irrigated copiously.  Subcutaneous tissues were approximated with 3-0 Monocryl.  Skin incision was closed with a running 3-0 nylon.  Sterile dressings were applied after infiltrating the surgical site with half percent Marcaine with epinephrine.  The tourniquet was released after application of the dressings.  The patient was then awakened from anesthesia and transported to the recovery room in stable condition.  FOLLOW UP PLAN: Weightbearing as tolerated in a flat postop shoe.  Follow-up in 2 weeks for suture removal.   Mechele Claude PA-C was present and scrubbed for the duration of the operative case. His assistance was essential in positioning the patient, prepping and draping, gaining and maintaining exposure, performing the operation, closing and dressing the wounds and applying the splint.

## 2017-05-08 NOTE — Transfer of Care (Signed)
Immediate Anesthesia Transfer of Care Note  Patient: Pamela Larson  Procedure(s) Performed: EXCISION MORTON'S NEUROMA Left 3rd webspace (Left Foot)  Patient Location: PACU  Anesthesia Type:General  Level of Consciousness: awake, sedated and patient cooperative  Airway & Oxygen Therapy: Patient Spontanous Breathing  Post-op Assessment: Report given to RN and Post -op Vital signs reviewed and stable  Post vital signs: Reviewed and stable  Last Vitals:  Vitals Value Taken Time  BP    Temp    Pulse 86 05/08/2017  9:40 AM  Resp 18 05/08/2017  9:40 AM  SpO2 100 % 05/08/2017  9:40 AM  Vitals shown include unvalidated device data.  Last Pain:  Vitals:   05/08/17 0759  TempSrc: Oral  PainSc: 0-No pain         Complications: No apparent anesthesia complications

## 2017-05-09 ENCOUNTER — Encounter (HOSPITAL_BASED_OUTPATIENT_CLINIC_OR_DEPARTMENT_OTHER): Payer: Self-pay | Admitting: Orthopedic Surgery

## 2017-05-25 DIAGNOSIS — R0689 Other abnormalities of breathing: Secondary | ICD-10-CM | POA: Diagnosis not present

## 2017-05-25 DIAGNOSIS — R05 Cough: Secondary | ICD-10-CM | POA: Diagnosis not present

## 2017-05-25 DIAGNOSIS — J22 Unspecified acute lower respiratory infection: Secondary | ICD-10-CM | POA: Diagnosis not present

## 2017-06-18 ENCOUNTER — Encounter: Payer: Self-pay | Admitting: Internal Medicine

## 2017-06-23 DIAGNOSIS — Z961 Presence of intraocular lens: Secondary | ICD-10-CM | POA: Diagnosis not present

## 2017-06-23 DIAGNOSIS — H43813 Vitreous degeneration, bilateral: Secondary | ICD-10-CM | POA: Diagnosis not present

## 2017-06-23 DIAGNOSIS — H524 Presbyopia: Secondary | ICD-10-CM | POA: Diagnosis not present

## 2017-07-04 DIAGNOSIS — F411 Generalized anxiety disorder: Secondary | ICD-10-CM | POA: Diagnosis not present

## 2017-07-04 DIAGNOSIS — F329 Major depressive disorder, single episode, unspecified: Secondary | ICD-10-CM | POA: Diagnosis not present

## 2017-07-04 DIAGNOSIS — E039 Hypothyroidism, unspecified: Secondary | ICD-10-CM | POA: Diagnosis not present

## 2017-07-04 DIAGNOSIS — J22 Unspecified acute lower respiratory infection: Secondary | ICD-10-CM | POA: Diagnosis not present

## 2017-07-13 DIAGNOSIS — J22 Unspecified acute lower respiratory infection: Secondary | ICD-10-CM | POA: Diagnosis not present

## 2017-07-13 DIAGNOSIS — F411 Generalized anxiety disorder: Secondary | ICD-10-CM | POA: Diagnosis not present

## 2017-07-13 DIAGNOSIS — F329 Major depressive disorder, single episode, unspecified: Secondary | ICD-10-CM | POA: Diagnosis not present

## 2017-07-13 DIAGNOSIS — N3289 Other specified disorders of bladder: Secondary | ICD-10-CM | POA: Diagnosis not present

## 2017-07-17 DIAGNOSIS — J22 Unspecified acute lower respiratory infection: Secondary | ICD-10-CM | POA: Diagnosis not present

## 2017-07-17 DIAGNOSIS — F329 Major depressive disorder, single episode, unspecified: Secondary | ICD-10-CM | POA: Diagnosis not present

## 2017-07-17 DIAGNOSIS — F411 Generalized anxiety disorder: Secondary | ICD-10-CM | POA: Diagnosis not present

## 2017-07-25 DIAGNOSIS — F329 Major depressive disorder, single episode, unspecified: Secondary | ICD-10-CM | POA: Diagnosis not present

## 2017-07-25 DIAGNOSIS — E039 Hypothyroidism, unspecified: Secondary | ICD-10-CM | POA: Diagnosis not present

## 2017-07-25 DIAGNOSIS — F411 Generalized anxiety disorder: Secondary | ICD-10-CM | POA: Diagnosis not present

## 2017-07-25 DIAGNOSIS — J22 Unspecified acute lower respiratory infection: Secondary | ICD-10-CM | POA: Diagnosis not present

## 2017-08-20 ENCOUNTER — Encounter: Payer: Self-pay | Admitting: Pulmonary Disease

## 2017-08-20 ENCOUNTER — Ambulatory Visit (INDEPENDENT_AMBULATORY_CARE_PROVIDER_SITE_OTHER): Payer: Medicare Other | Admitting: Pulmonary Disease

## 2017-08-20 VITALS — BP 152/90 | HR 80 | Ht 61.0 in | Wt 106.0 lb

## 2017-08-20 DIAGNOSIS — J40 Bronchitis, not specified as acute or chronic: Secondary | ICD-10-CM | POA: Diagnosis not present

## 2017-08-20 DIAGNOSIS — J471 Bronchiectasis with (acute) exacerbation: Secondary | ICD-10-CM | POA: Diagnosis not present

## 2017-08-20 DIAGNOSIS — J329 Chronic sinusitis, unspecified: Secondary | ICD-10-CM | POA: Diagnosis not present

## 2017-08-20 DIAGNOSIS — R0602 Shortness of breath: Secondary | ICD-10-CM

## 2017-08-20 NOTE — Patient Instructions (Signed)
Recurrent infections/bronchitis: I am suspicious that she may have bronchiectasis though you are also at increased risk for COPD We will assess for COPD with a test called a spirometry test today We will assess for bronchiectasis with a high-resolution CT scan of your chest Please provide [place] with a sample of your mucus so that we can send it to the lab to test for bacterial, fungal, AFB organisms.  We will see you back in about 2 weeks to go over these results and discuss the steps in your management.

## 2017-08-20 NOTE — Progress Notes (Signed)
Synopsis: Referred in July 2019 for recurrent bronchitis episodes ever since August 2015.; smoked 2 packs of cigarettes daily for 20 years, quit in 1992  Subjective:   PATIENT ID: [place]: female DOB: 04-Oct-1945, MRN: [id number]   HPI  Chief Complaint  Patient presents with  . New Consult    chest tightness, short of breath, wheezing, happening every 6 months    Pamela Larson is here to see me for several pulmonary complaints.  She says that primarily because she had pneumonia 10 years ago and prior to that she had only experienced some occasional bronchitis. In August 2015 she had the sudden onset of flu like symptoms and felt like she had been hit in the back.  She was diagnosed with pneumonia again by an Urgent Care physician.  She says that later the radiologist said taht the CXR was clear and she was called and told not to take any antibiotics. However her symptoms worsened and she had persistent fatigue, malaise and dyspnea.  She went back to another Urgent Care facility where she was told that she had a serious lung infection.  She was diagnosed with a lung exam based on a physical exam.  She was treated then with Levaquin and "steroids".  She was still sick and she says that she went to the doctor [facility]) quite a bit and was sick through October 2015.  She finally started to feel better.  However since then she gets sick every six months.    She had Lyme disease diagnosed in 1991.  No respiratory symptoms then.  However she had a rash, fevers, muscle stiffness, lymph node swelling, joint aches.    She says that she had "bronchitis like kids do" when she was a child, typically about every other year.  Her aunt died of lung cancer, no other lung disease in the family. She smoked 2ppd for 20 years through 1991.   In between her episodes of bronchitis (which now occur about every 6 months) she feels "OK".  She says that she will produce "a little bit".    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    Past Medical History:  Diagnosis Date  . Anemia    pernicious anemia - 1991   . Anxiety   . Arthritis    rheumatoid arthritis   . Chronic fatigue   . Cystitis, interstitial   . Depression   . Disc disorder of lumbar region 2013  . Fibromyalgia   . GERD (gastroesophageal reflux disease)   . History of kidney stones   . Hypothyroidism   . Lyme disease    hx of possible   . 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 Osteoporosis   . Pneumonia    hx of   . Rheumatoid arthritis ([place])      Family History  Problem Relation Age of Onset  . Cancer Mother   . Breast cancer Mother   . Colon cancer Mother   . Pancreatic cancer Mother   . Cancer Maternal Grandmother      Social History   Socioeconomic History  . Marital status: Single    Spouse name: Not on file  . Number of children: Not on file  . Years of education: Not on file  . Highest education level: Not on file  Occupational History  . Not on  file  Social Needs  . Financial resource strain: Not on file  . Food insecurity:    Worry: Not on file    Inability: Not on file  . Transportation needs:    Medical: Not on file    Non-medical: Not on file  Tobacco Use  . Smoking status: Former Smoker    Packs/day: 2.00    Years: 20.00    Pack years: 40.00    Types: Cigarettes    Last attempt to quit: 02/11/1990    Years since quitting: 27.5  . Smokeless tobacco: Never Used  Substance and Sexual Activity  . Alcohol use: Yes    Alcohol/week: 8.4 oz    Types: 14 Standard drinks or equivalent per week    Comment: drink per nite scotch  . Drug use: No  . Sexual activity: Never    Partners: Male  Lifestyle  . Physical activity:    Days per week: Not on file    Minutes per session: Not on file  . Stress: Not on file  Relationships  . Social connections:    Talks on phone: Not on file      Gets together: Not on file    Attends religious service: Not on file    Active member of club or organization: Not on file    Attends meetings of clubs or organizations: Not on file    Relationship status: Not on file  . Intimate partner violence:    Fear of current or ex partner: Not on file    Emotionally abused: Not on file    Physically abused: Not on file    Forced sexual activity: Not on file  Other Topics Concern  . Not on file  Social History Narrative  . Not on file     Allergies  Allergen Reactions  . Amoxicillin Nausea And Vomiting     Has patient had a PCN reaction causing immediate rash, facial/tongue/throat swelling, SOB or lightheadedness with hypotension: #  #  #  YES  #  #  #  Has patient had a PCN reaction causing severe rash involving mucus membranes or skin necrosis: No Has patient had a PCN reaction that required hospitalization No Has patient had a PCN reaction occurring within the last 10 years: #  #  #  YES  #  #  #  If all of the above answers are "NO", then may proceed with Cephalosporin use.  . Augmentin [Amoxicillin-Pot Clavulanate] Nausea And Vomiting  . Demerol [Meperidine Hcl] Nausea And Vomiting  . Sulfa Antibiotics Nausea And Vomiting     Outpatient Medications Prior to Visit  Medication Sig Dispense Refill  . acetaminophen (TYLENOL) 500 MG tablet Take 1,000 mg by mouth every 6 (six) hours as needed for mild pain. For pain    . ADVAIR DISKUS 250-50 MCG/DOSE AEPB INL 1 PUFF PO BID  0  . albuterol (PROVENTIL HFA;VENTOLIN HFA) 108 (90 BASE) MCG/ACT inhaler Inhale 2 puffs into the lungs every 6 (six) hours as needed for wheezing or shortness of breath. For shortness of breath     . ALPRAZolam (XANAX) 0.25 MG tablet Take 0.125 mg by mouth daily as needed for anxiety. Pt takes 1/2 tablet as needed     . calcium carbonate (OS-CAL - DOSED IN MG OF ELEMENTAL CALCIUM) 1250 MG tablet Take 1 tablet by mouth daily.    . fluticasone (FLONASE) 50 MCG/ACT  nasal spray Place 1 spray into both nostrils daily as needed for allergies  or rhinitis.    Marland Kitchen guaiFENesin (MUCINEX) 600 MG 12 hr tablet Take 600 mg by mouth 2 (two) times daily as needed for to loosen phlegm.    Marland Kitchen levothyroxine (SYNTHROID, LEVOTHROID) 100 MCG tablet Take 50-100 mcg by mouth See admin instructions. TAKES 100 MCG DAILY EXCEPT ON THURSDAYS AND TAKES 50 MCG    . Melatonin 3 MG CAPS Take 3 mg by mouth at bedtime as needed (SLEEP).    . naproxen sodium (ANAPROX) 220 MG tablet Take 440 mg by mouth 2 (two) times daily as needed (PAIN).    Marland Kitchen sodium chloride (OCEAN) 0.65 % SOLN nasal spray Place 1 spray into both nostrils 2 (two) times daily as needed for congestion.    . Vitamin D, Ergocalciferol, (DRISDOL) 50000 UNITS CAPS Take 50,000 Units by mouth every 7 (seven) days. On Mondays    . nystatin (MYCOSTATIN) 100000 UNIT/ML suspension Take 5 mLs (500,000 Units total) by mouth 4 (four) times daily. (Patient not taking: Reported on 08/20/2017) 120 mL 0  . omeprazole (PRILOSEC) 20 MG capsule Take 20 mg by mouth daily as needed. For indigestion    . Probiotic Product (PROBIOTIC PO) Take 4 tablets by mouth 4 (four) times daily.    Marland Kitchen docusate sodium (COLACE) 100 MG capsule Take 1 capsule (100 mg total) by mouth 2 (two) times daily. While taking narcotic pain medicine. 30 capsule 0  . senna (SENOKOT) 8.6 MG TABS tablet Take 2 tablets (17.2 mg total) by mouth 2 (two) times daily. 30 each 0  . traMADol (ULTRAM) 50 MG tablet Take 1 tablet (50 mg total) by mouth every 6 (six) hours as needed. For no more than 5 days. 20 tablet 0   No facility-administered medications prior to visit.     Review of Systems  Constitutional: Positive for weight loss. Negative for fever.  HENT: Positive for congestion and sore throat. Negative for ear pain and nosebleeds.   Eyes: Negative for redness.  Respiratory: Positive for cough, shortness of breath and wheezing.   Cardiovascular: Negative for palpitations, leg  swelling and PND.  Gastrointestinal: Positive for nausea. Negative for vomiting.  Genitourinary: Negative for dysuria.  Skin: Negative for rash.  Neurological: Negative for headaches.  Endo/Heme/Allergies: Does not bruise/bleed easily.  Psychiatric/Behavioral: Negative for depression. The patient is not nervous/anxious.       Objective:  Physical Exam   Vitals:   08/20/17 1557  BP: (!) 152/90  Pulse: 80  SpO2: 93%  Weight: 106 lb (48.1 kg)  Height: 5\' 1"  (1.549 m)    RA  Gen: well appearing, no acute distress HENT: NCAT, OP clear, neck supple without masses Eyes: PERRL, EOMi Lymph: no cervical lymphadenopathy PULM: CTA B CV: RRR, no mgr, no JVD GI: BS+, soft, nontender, no hsm Derm: no rash or skin breakdown MSK: normal bulk and tone Neuro: A&Ox4, CN II-XII intact, strength 5/5 in all 4 extremities Psyche: normal mood and affect   CBC    Component Value Date/Time   WBC 6.1 03/27/2016 1146   RBC 4.08 03/27/2016 1146   HGB 14.0 03/27/2016 1146   HCT 42.2 03/27/2016 1146   PLT 328 03/27/2016 1146   MCV 103.4 (H) 03/27/2016 1146   MCV 105.8 (A) 02/12/2012 1140   MCH 34.3 (H) 03/27/2016 1146   MCHC 33.2 03/27/2016 1146   RDW 13.6 03/27/2016 1146   LYMPHSABS 1.9 11/02/2013 0432   MONOABS 0.5 11/02/2013 0432   EOSABS 0.1 11/02/2013 0432   BASOSABS 0.0 11/02/2013 0432  Chest imaging: Chest x-ray from August 2015 was independently reviewed showing possible emphysematous changes (increased AP diameter) with airway thickening in the right lower lobe notable  PFT:  Labs:  Path:  Echo:  Heart Catheterization:       Assessment & Plan:   Chronic sinusitis with recurrent bronchitis  Bronchiectasis with acute exacerbation ([place]) - Plan: CT Chest High Resolution, Spirometry with Graph, Fungus Culture with Smear,  MYCOBACTERIA, CULTURE, WITH FLUOROCHROME SMEAR, Respiratory or Resp and Sputum Culture  Shortness of breath - Plan: CT Chest High Resolution,  Spirometry with Graph, Fungus Culture with Smear,  MYCOBACTERIA, CULTURE, WITH FLUOROCHROME SMEAR, Respiratory or Resp and Sputum Culture  Discussion: Pamela Larson comes to my clinic today for evaluation of recurrent bronchitis episodes for the last 5 years or so ever since a very severe episode of pneumonia versus bronchitis.  She smokes 2 packs a day for 20 years so she is at increased risk for COPD however in between her episodes of recurrent bronchitis she has no respiratory symptoms other than a little mucus production so I think the likelihood of this just being COPD alone is lower.  I am more suspicious of bronchiectasis considering the fact that her predominant symptom is recurrent airway infections.  Plan: Recurrent infections/bronchitis: I am suspicious that she may have bronchiectasis though you are also at increased risk for COPD We will assess for COPD with a test called a spirometry test today We will assess for bronchiectasis with a high-resolution CT scan of your chest Please provide [place] with a sample of your mucus so that we can send it to the lab to test for bacterial, fungal, AFB organisms.  We will see you back in about 2 weeks to go over these results and discuss the steps in your management.    Current Outpatient Medications:  .  acetaminophen (TYLENOL) 500 MG tablet, Take 1,000 mg by mouth every 6 (six) hours as needed for mild pain. For pain, Disp: , Rfl:  .  ADVAIR DISKUS 250-50 MCG/DOSE AEPB, INL 1 PUFF PO BID, Disp: , Rfl: 0 .  albuterol (PROVENTIL HFA;VENTOLIN HFA) 108 (90 BASE) MCG/ACT inhaler, Inhale 2 puffs into the lungs every 6 (six) hours as needed for wheezing or shortness of breath. For shortness of breath , Disp: , Rfl:  .  ALPRAZolam (XANAX) 0.25 MG tablet, Take 0.125 mg by mouth daily as needed for anxiety. Pt takes 1/2 tablet as needed , Disp: , Rfl:  .  calcium carbonate (OS-CAL - DOSED IN MG OF ELEMENTAL CALCIUM) 1250 MG tablet, Take 1 tablet by mouth daily.,  Disp: , Rfl:  .  fluticasone (FLONASE) 50 MCG/ACT nasal spray, Place 1 spray into both nostrils daily as needed for allergies or rhinitis., Disp: , Rfl:  .  guaiFENesin (MUCINEX) 600 MG 12 hr tablet, Take 600 mg by mouth 2 (two) times daily as needed for to loosen phlegm., Disp: , Rfl:  .  levothyroxine (SYNTHROID, LEVOTHROID) 100 MCG tablet, Take 50-100 mcg by mouth See admin instructions. TAKES 100 MCG DAILY EXCEPT ON THURSDAYS AND TAKES 50 MCG, Disp: , Rfl:  .  Melatonin 3 MG CAPS, Take 3 mg by mouth at bedtime as needed (SLEEP)., Disp: , Rfl:  .  naproxen sodium (ANAPROX) 220 MG tablet, Take 440 mg by mouth 2 (two) times daily as needed (PAIN)., Disp: , Rfl:  .  sodium chloride (OCEAN) 0.65 % SOLN nasal spray, Place 1 spray into both nostrils 2 (two) times daily as needed for  congestion., Disp: , Rfl:  .  Vitamin D, Ergocalciferol, (DRISDOL) 50000 UNITS CAPS, Take 50,000 Units by mouth every 7 (seven) days. On Mondays, Disp: , Rfl:  .  nystatin (MYCOSTATIN) 100000 UNIT/ML suspension, Take 5 mLs (500,000 Units total) by mouth 4 (four) times daily. (Patient not taking: Reported on 08/20/2017), Disp: 120 mL, Rfl: 0 .  omeprazole (PRILOSEC) 20 MG capsule, Take 20 mg by mouth daily as needed. For indigestion, Disp: , Rfl:  .  Probiotic Product (PROBIOTIC PO), Take 4 tablets by mouth 4 (four) times daily., Disp: , Rfl:

## 2017-08-21 ENCOUNTER — Telehealth: Payer: Self-pay | Admitting: Pulmonary Disease

## 2017-08-21 NOTE — Telephone Encounter (Signed)
Called and spoke with patient, advised her that she is to cough up her mucus and place it in the cups given. Patient is to bring sputum sample to our lab. Patient verbalized understanding. Nothing further needed.

## 2017-08-22 ENCOUNTER — Other Ambulatory Visit: Payer: Medicare Other

## 2017-08-22 DIAGNOSIS — J471 Bronchiectasis with (acute) exacerbation: Secondary | ICD-10-CM

## 2017-08-22 DIAGNOSIS — R0602 Shortness of breath: Secondary | ICD-10-CM

## 2017-08-27 ENCOUNTER — Ambulatory Visit (INDEPENDENT_AMBULATORY_CARE_PROVIDER_SITE_OTHER)
Admission: RE | Admit: 2017-08-27 | Discharge: 2017-08-27 | Disposition: A | Discharge status: Home / Self Care | Payer: Medicare Other | Source: Ambulatory Visit | Attending: Pulmonary Disease | Admitting: Pulmonary Disease

## 2017-08-27 DIAGNOSIS — J471 Bronchiectasis with (acute) exacerbation: Secondary | ICD-10-CM | POA: Diagnosis not present

## 2017-08-27 DIAGNOSIS — R0602 Shortness of breath: Secondary | ICD-10-CM | POA: Diagnosis not present

## 2017-08-27 DIAGNOSIS — J439 Emphysema, unspecified: Secondary | ICD-10-CM | POA: Diagnosis not present

## 2017-08-28 DIAGNOSIS — S42255D Nondisplaced fracture of greater tuberosity of left humerus, subsequent encounter for fracture with routine healing: Secondary | ICD-10-CM | POA: Diagnosis not present

## 2017-08-28 DIAGNOSIS — M25512 Pain in left shoulder: Secondary | ICD-10-CM | POA: Diagnosis not present

## 2017-09-03 ENCOUNTER — Ambulatory Visit (INDEPENDENT_AMBULATORY_CARE_PROVIDER_SITE_OTHER): Payer: Medicare Other | Admitting: Acute Care

## 2017-09-03 ENCOUNTER — Encounter: Payer: Self-pay | Admitting: Acute Care

## 2017-09-03 VITALS — BP 122/84 | HR 82

## 2017-09-03 DIAGNOSIS — J309 Allergic rhinitis, unspecified: Secondary | ICD-10-CM

## 2017-09-03 DIAGNOSIS — R0602 Shortness of breath: Secondary | ICD-10-CM

## 2017-09-03 DIAGNOSIS — J42 Unspecified chronic bronchitis: Secondary | ICD-10-CM | POA: Diagnosis not present

## 2017-09-03 DIAGNOSIS — J449 Chronic obstructive pulmonary disease, unspecified: Secondary | ICD-10-CM | POA: Insufficient documentation

## 2017-09-03 DIAGNOSIS — J441 Chronic obstructive pulmonary disease with (acute) exacerbation: Secondary | ICD-10-CM | POA: Insufficient documentation

## 2017-09-03 MED ORDER — UMECLIDINIUM-VILANTEROL 62.5-25 MCG/INH IN AEPB: 1.0000 | INHALATION_SPRAY | Freq: Every day | RESPIRATORY_TRACT | 0 refills | Status: DC

## 2017-09-03 NOTE — Patient Instructions (Addendum)
It is nice to meet you today. We will refer you to allergist for evaluation of allergies as cause of excessive mucus.( Dr. Alphonsa Gin) Continue Zyrtec once daily Try nasal / sinus irrigations Continue Flonase as you have been doing. We will do a therapeutic trial of Anoro 1 puff once daily. We will instruct you on use. Rinse mouth after use. If you like the Anoro, let us know so we can send in a prescription. We will schedule you for PFT's Follow up in 2 months with Dr. Lake Bells or Judson Roch NP Note your daily symptoms > remember "red flags" :  Increase in cough, increase in sputum production, increase in shortness of breath or activity intolerance. If you notice these symptoms, please call to be seen.   Please contact office for sooner follow up if symptoms do not improve or worsen or seek emergency care

## 2017-09-03 NOTE — Progress Notes (Addendum)
History of Present Illness Pamela Larson is a 72 y.o. female former smoker ( quit 1992 with a 40 pack year smoking history) with recurrent infections, and  bronchitis. She is followed by Dr. Lake Bells  Synopsis: Referred in July 2019 for recurrent bronchitis episodes ever since August 2015.; smoked 2 packs of cigarettes daily for 20 years, quit in 1992   09/03/2017 2 Week Follow up after CT. Pt. Was referred to Dr. Lake Bells  for multiple pulmonary complaints 08/20/2017. She presented with   complaints of worsening pulmonary status since a pneumonia diagnosis 10 years ago. Prior to that she had only experienced some occasional bronchitis. In August 2015 she had the sudden onset of flu like symptoms and felt like she had been hit in the back.  She was diagnosed with pneumonia again by an Urgent Care physician.  She says that later the radiologist said that  the CXR was clear and she was called and told not to take any antibiotics. However her symptoms worsened and she had persistent fatigue, malaise and dyspnea.  She went back to another Urgent Care facility where she was told that she had a serious lung infection.  She was diagnosed with a lung exam based on a physical exam.  She was treated then with Levaquin and "steroids".  She was still sick and she says that she went to the doctor Lang Snow) quite a bit and was sick through October 2015.  She finally started to feel better.  However since then she gets sick every six months.  She had Lyme disease diagnosed in 1991.  No respiratory symptoms then.  However she had a rash, fevers, muscle stiffness, lymph node swelling, joint aches.  After Dr. Lake Bells saw her 7/10 he felt her recurrent bronchitis was most likely due to either COPD, or more likely bronchiectasis. He ordered spirometry, HRCT, and sputum  for bacterial, fungal, AFB organisms.  Pt. Presents today to discuss the results of her HRCT, Spirometry and sputum cultures. All results are as noted  below. We have reviewed the results of her cultures, spirometry and  HRCT. She states she is slowly feeling better. This is about 8 weeks after her initial flare. She does have nasal congestion and what she would consider sinus issues. She states she does produce a good amount of sputum.She is interested in allergy referral. I explained that some of her sputum cultures will take awhile for final results, but that they are negative at present. We discussed that her CT was negative for bronchiectasis, but did show some Emphysema. She is open to trying an inhaler to see if she has some improvement in her symptoms. She denies fever, chest pain, orthopnea or hemoptysis.     Test Results: HRCT Chest 08/27/2017 Cardiovascular: Heart size is normal. There is no significant pericardial fluid, thickening or pericardial calcification. There is aortic atherosclerosis, as well as atherosclerosis of the great vessels of the mediastinum and the coronary arteries, including calcified atherosclerotic plaque in the left main and left anterior descending coronary arteries.  Mediastinum/Nodes: No pathologically enlarged mediastinal or hilar lymph nodes. Please note that accurate exclusion of hilar adenopathy is limited on noncontrast CT scans. Esophagus is unremarkable in appearance. No axillary lymphadenopathy.  Lungs/Pleura: High-resolution images demonstrate no significant regions of ground-glass attenuation, subpleural reticulation, parenchymal banding, traction bronchiectasis or frank honeycombing. Inspiratory and expiratory images demonstrates some mild air trapping indicative of small airways disease. Azygos lobe (normal anatomical variant) incidentally noted. No acute consolidative airspace disease.  No pleural effusions. Mild diffuse bronchial wall thickening with very mild paraseptal emphysema most apparent in the lung apices.  Upper Abdomen: Aortic atherosclerosis. Two low-attenuation  lesions are noted in the liver, largest of which is in segment 4B measuring up to 19 mm in diameter, incompletely characterized on today's noncontrast CT examination, but statistically likely to represent cysts. Aortic atherosclerosis.  Musculoskeletal: Chronic compression fracture of L1 with 30% loss of anterior vertebral body height. There are no aggressive appearing lytic or blastic lesions noted in the visualized portions of the Skeleton.  Impressions: No findings to suggest interstitial lung disease. Mild diffuse bronchial wall thickening with very mild paraseptal emphysema. Aortic atherosclerosis, in addition to left main and left anterior descending coronary artery disease. Please note that although the presence of coronary artery calcium documents the presence of coronary artery disease, the severity of this disease and any potential stenosis cannot be assessed on this non-gated CT examination. Assessment for potential risk factor modification, dietary therapy or pharmacologic therapy may be warranted, if clinically indicated.  Spirometry done 08/20/2017>> Normal Spirometry  Micro 08/22/2017>> Sputum Culture>> Growth of normal oropharyngeal flora.   AFB>> Negative, final result pending Fungal>> Negative at present, final pending   CBC Latest Ref Rng & Units 03/27/2016 11/06/2014 11/02/2013  WBC 4.0 - 10.5 K/uL 6.1 8.5 7.5  Hemoglobin 12.0 - 15.0 g/dL 14.0 13.7 14.0  Hematocrit 36.0 - 46.0 % 42.2 41.1 41.0  Platelets 150 - 400 K/uL 328 305 310    BMP Latest Ref Rng & Units 03/27/2016 11/06/2014 11/02/2013  Glucose 65 - 99 mg/dL 86 87 115(H)  BUN 6 - 20 mg/dL 10 10 10   Creatinine 0.44 - 1.00 mg/dL 0.65 0.77 0.69  Sodium 135 - 145 mmol/L 139 139 139  Potassium 3.5 - 5.1 mmol/L 4.3 4.1 3.4(L)  Chloride 101 - 111 mmol/L 101 99(L) 99  CO2 22 - 32 mmol/L 25 27 22   Calcium 8.9 - 10.3 mg/dL 9.8 9.7 9.1    BNP No results found for: BNP  ProBNP No results found for:  PROBNP  PFT No results found for: FEV1PRE, FEV1POST, FVCPRE, FVCPOST, TLC, DLCOUNC, PREFEV1FVCRT, PSTFEV1FVCRT  Ct Chest High Resolution  Result Date: 08/28/2017 CLINICAL DATA:  72 year old female with history of recurrent bronchitis 4 times in the past 2 years. Shortness of breath. EXAM: CT CHEST WITHOUT CONTRAST TECHNIQUE: Multidetector CT imaging of the chest was performed following the standard protocol without intravenous contrast. High resolution imaging of the lungs, as well as inspiratory and expiratory imaging, was performed. COMPARISON:  No priors. FINDINGS: Cardiovascular: Heart size is normal. There is no significant pericardial fluid, thickening or pericardial calcification. There is aortic atherosclerosis, as well as atherosclerosis of the great vessels of the mediastinum and the coronary arteries, including calcified atherosclerotic plaque in the left main and left anterior descending coronary arteries. Mediastinum/Nodes: No pathologically enlarged mediastinal or hilar lymph nodes. Please note that accurate exclusion of hilar adenopathy is limited on noncontrast CT scans. Esophagus is unremarkable in appearance. No axillary lymphadenopathy. Lungs/Pleura: High-resolution images demonstrate no significant regions of ground-glass attenuation, subpleural reticulation, parenchymal banding, traction bronchiectasis or frank honeycombing. Inspiratory and expiratory images demonstrates some mild air trapping indicative of small airways disease. Azygos lobe (normal anatomical variant) incidentally noted. No acute consolidative airspace disease. No pleural effusions. Mild diffuse bronchial wall thickening with very mild paraseptal emphysema most apparent in the lung apices. Upper Abdomen: Aortic atherosclerosis. Two low-attenuation lesions are noted in the liver, largest of which is in segment  4B measuring up to 19 mm in diameter, incompletely characterized on today's noncontrast CT examination, but  statistically likely to represent cysts. Aortic atherosclerosis. Musculoskeletal: Chronic compression fracture of L1 with 30% loss of anterior vertebral body height. There are no aggressive appearing lytic or blastic lesions noted in the visualized portions of the skeleton. IMPRESSION: 1. No findings to suggest interstitial lung disease. 2. Mild diffuse bronchial wall thickening with very mild paraseptal emphysema. 3. Aortic atherosclerosis, in addition to left main and left anterior descending coronary artery disease. Please note that although the presence of coronary artery calcium documents the presence of coronary artery disease, the severity of this disease and any potential stenosis cannot be assessed on this non-gated CT examination. Assessment for potential risk factor modification, dietary therapy or pharmacologic therapy may be warranted, if clinically indicated. 4. Additional incidental findings, as above. Aortic Atherosclerosis (ICD10-I70.0) and Emphysema (ICD10-J43.9). Electronically Signed   By: Vinnie Langton M.D.   On: 08/28/2017 10:55     Past medical hx Past Medical History:  Diagnosis Date  . Anemia    pernicious anemia - 1991   . Anxiety   . Arthritis    rheumatoid arthritis   . Chronic fatigue   . Cystitis, interstitial   . Depression   . Disc disorder of lumbar region 2013  . Fibromyalgia   . GERD (gastroesophageal reflux disease)   . History of kidney stones   . Hypothyroidism   . Lyme disease    hx of possible   . 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 Osteoporosis   . Pneumonia    hx of   . Rheumatoid arthritis ([place])      Social History   Tobacco Use  . Smoking status: Former Smoker    Packs/day: 2.00    Years: 20.00    Pack years: 40.00    Types: Cigarettes    Last attempt to quit: 02/11/1990    Years since quitting: 27.5  . Smokeless tobacco: Never Used  Substance Use Topics  . Alcohol use: Yes     Alcohol/week: 8.4 oz    Types: 14 Standard drinks or equivalent per week    Comment: drink per nite scotch  . Drug use: No    Ms.Grennan reports that she quit smoking about 27 years ago. Her smoking use included cigarettes. She has a 40.00 pack-year smoking history. She has never used smokeless tobacco. She reports that she drinks about 8.4 oz of alcohol per week. She reports that she does not use drugs.  Tobacco Cessation: Former smoker   Past surgical hx, Family hx, Social hx all reviewed.  Current Outpatient Medications on File Prior to Visit  Medication Sig  . acetaminophen (TYLENOL) 500 MG tablet Take 1,000 mg by mouth every 6 (six) hours as needed for mild pain. For pain  . ADVAIR DISKUS 250-50 MCG/DOSE AEPB INL 1 PUFF PO BID  . albuterol (PROVENTIL HFA;VENTOLIN HFA) 108 (90 BASE) MCG/ACT inhaler Inhale 2 puffs into the lungs every 6 (six) hours as needed for wheezing or shortness of breath. For shortness of breath   . ALPRAZolam (XANAX) 0.25 MG tablet Take 0.125 mg by mouth daily as needed for anxiety. Pt takes 1/2 tablet as needed   . calcium carbonate (OS-CAL - DOSED IN MG OF ELEMENTAL CALCIUM) 1250 MG tablet Take 1 tablet by mouth daily.  . fluticasone (FLONASE) 50 MCG/ACT nasal spray Place 1 spray into both  nostrils daily as needed for allergies or rhinitis.  Marland Kitchen guaiFENesin (MUCINEX) 600 MG 12 hr tablet Take 600 mg by mouth 2 (two) times daily as needed for to loosen phlegm.  Marland Kitchen levothyroxine (SYNTHROID, LEVOTHROID) 100 MCG tablet Take 50-100 mcg by mouth See admin instructions. TAKES 100 MCG DAILY EXCEPT ON THURSDAYS AND TAKES 50 MCG  . Melatonin 3 MG CAPS Take 3 mg by mouth at bedtime as needed (SLEEP).  . naproxen sodium (ANAPROX) 220 MG tablet Take 440 mg by mouth 2 (two) times daily as needed (PAIN).  Marland Kitchen nystatin (MYCOSTATIN) 100000 UNIT/ML suspension Take 5 mLs (500,000 Units total) by mouth 4 (four) times daily.  Marland Kitchen omeprazole (PRILOSEC) 20 MG capsule Take 20 mg by mouth  daily as needed. For indigestion  . Probiotic Product (PROBIOTIC PO) Take 4 tablets by mouth 4 (four) times daily.  . sodium chloride (OCEAN) 0.65 % SOLN nasal spray Place 1 spray into both nostrils 2 (two) times daily as needed for congestion.  . Vitamin D, Ergocalciferol, (DRISDOL) 50000 UNITS CAPS Take 50,000 Units by mouth every 7 (seven) days. On Mondays   No current facility-administered medications on file prior to visit.      Allergies  Allergen Reactions  . Amoxicillin Nausea And Vomiting     Has patient had a PCN reaction causing immediate rash, facial/tongue/throat swelling, SOB or lightheadedness with hypotension: #  #  #  YES  #  #  #  Has patient had a PCN reaction causing severe rash involving mucus membranes or skin necrosis: No Has patient had a PCN reaction that required hospitalization No Has patient had a PCN reaction occurring within the last 10 years: #  #  #  YES  #  #  #  If all of the above answers are "NO", then may proceed with Cephalosporin use.  . Augmentin [Amoxicillin-Pot Clavulanate] Nausea And Vomiting  . Demerol [Meperidine Hcl] Nausea And Vomiting  . Sulfa Antibiotics Nausea And Vomiting    Review Of Systems:  Constitutional:   No  weight loss, night sweats,  Fevers, chills, fatigue, or  lassitude.  HEENT:   No headaches,  Difficulty swallowing,  Tooth/dental problems, or  Sore throat,                No sneezing, itching, ear ache,+ nasal congestion, +post nasal drip,   CV:  No chest pain,  Orthopnea, PND, swelling in lower extremities, anasarca, dizziness, palpitations, syncope.   GI  No heartburn, indigestion, abdominal pain, nausea, vomiting, diarrhea, change in bowel habits, loss of appetite, bloody stools.   Resp: + shortness of breath with exertion less at rest.  + excess mucus, no productive cough,  No non-productive cough,  No coughing up of blood.  No change in color of mucus.  No wheezing.  No chest wall deformity  Skin: no rash or  lesions.  GU: no dysuria, change in color of urine, no urgency or frequency.  No flank pain, no hematuria   MS:  No joint pain or swelling.  No decreased range of motion.  No back pain.  Psych:  No change in mood or affect. No depression or anxiety.  No memory loss.   Vital Signs BP 122/84 (BP Location: Left Arm, Cuff Size: Normal)   Pulse 82   SpO2 98%    Physical Exam:  General- No distress,  A&Ox3, pleasant  ENT: No sinus tenderness, TM clear, pale nasal mucosa, no oral exudate,+ post nasal drip, no LAN  Cardiac: S1, S2, regular rate and rhythm, no murmur Chest: No wheeze/ rales/ dullness; no accessory muscle use, no nasal flaring, no sternal retractions, diminished per bases Abd.: Soft Non-tender, ND, BS +, non-obese Ext: No clubbing cyanosis, edema Neuro:  normal strength, MAE x 4, A&O x 3 Skin: No rashes, warm and dry Psych: normal mood and behavior   Assessment/Plan  COPD with chronic bronchitis and emphysema (HCC) Frequent Flares Current flare is resolving ? If Allergic rhinitis vs COPD could be cause CT negative for bronchiectasis Plan: We will refer you to allergist for evaluation of allergies as cause of excessive mucus.( Dr. Alphonsa Gin), and for allergy eval We will do a therapeutic trial of Anoro 1 puff once daily. We will instruct you on use. Rinse mouth after use. If you like the Anoro, let us know so we can send in a prescription. We will schedule you for PFT's Follow up in 2 months with Dr. Lake Bells or Judson Roch NP Note your daily symptoms > remember "red flags" :  Increase in cough, increase in sputum production, increase in shortness of breath or activity intolerance. If you notice these symptoms, please call to be seen.   Please contact office for sooner follow up if symptoms do not improve or worsen or seek emergency care    Allergic rhinitis ? If contributing to frequent bronchitis flares CT negative for bronchiectasis Plan We will refer you to allergist  for evaluation of allergies as cause of excessive mucus.( Dr. Alphonsa Gin) Continue Zyrtec once daily Try nasal / sinus irrigations Continue Flonase as you have been doing. We will do a therapeutic trial of Anoro 1 puff once daily. We will instruct you on use. Rinse mouth after use. Follow up in 2 months with Dr. Lake Bells or Judson Roch NP Note your daily symptoms > remember "red flags" :  Increase in cough, increase in sputum production, increase in shortness of breath or activity intolerance. If you notice these symptoms, please call to be seen.   Please contact office for sooner follow up if symptoms do not improve or worsen or seek emergency care      [name], NP 09/03/2017  6:05 PM

## 2017-09-03 NOTE — Assessment & Plan Note (Signed)
?   If contributing to frequent bronchitis flares CT negative for bronchiectasis Plan We will refer you to allergist for evaluation of allergies as cause of excessive mucus.( Dr. Alphonsa Gin) Continue Zyrtec once daily Try nasal / sinus irrigations Continue Flonase as you have been doing. We will do a therapeutic trial of Anoro 1 puff once daily. We will instruct you on use. Rinse mouth after use. Follow up in 2 months with Dr. Lake Bells or Judson Roch NP Note your daily symptoms > remember "red flags" :  Increase in cough, increase in sputum production, increase in shortness of breath or activity intolerance. If you notice these symptoms, please call to be seen.   Please contact office for sooner follow up if symptoms do not improve or worsen or seek emergency care

## 2017-09-03 NOTE — Assessment & Plan Note (Addendum)
Frequent Flares Current flare is resolving ? If Allergic rhinitis vs COPD could be cause CT negative for bronchiectasis Plan: We will refer you to allergist for evaluation of allergies as cause of excessive mucus.( Dr. Alphonsa Gin), and for allergy eval We will do a therapeutic trial of Anoro 1 puff once daily. We will instruct you on use. Rinse mouth after use. If you like the Anoro, let us know so we can send in a prescription. We will schedule you for PFT's Follow up in 2 months with Dr. Lake Bells or Judson Roch NP Note your daily symptoms > remember "red flags" :  Increase in cough, increase in sputum production, increase in shortness of breath or activity intolerance. If you notice these symptoms, please call to be seen.   Please contact office for sooner follow up if symptoms do not improve or worsen or seek emergency care

## 2017-09-05 DIAGNOSIS — M25512 Pain in left shoulder: Secondary | ICD-10-CM | POA: Diagnosis not present

## 2017-09-10 ENCOUNTER — Encounter: Payer: Self-pay | Admitting: Acute Care

## 2017-09-10 ENCOUNTER — Ambulatory Visit (INDEPENDENT_AMBULATORY_CARE_PROVIDER_SITE_OTHER): Payer: Medicare Other | Admitting: Acute Care

## 2017-09-10 DIAGNOSIS — J449 Chronic obstructive pulmonary disease, unspecified: Secondary | ICD-10-CM | POA: Diagnosis not present

## 2017-09-10 DIAGNOSIS — J329 Chronic sinusitis, unspecified: Secondary | ICD-10-CM | POA: Insufficient documentation

## 2017-09-10 DIAGNOSIS — J0110 Acute frontal sinusitis, unspecified: Secondary | ICD-10-CM | POA: Diagnosis not present

## 2017-09-10 MED ORDER — UMECLIDINIUM-VILANTEROL 62.5-25 MCG/INH IN AEPB: 1.0000 | INHALATION_SPRAY | Freq: Every day | RESPIRATORY_TRACT | 5 refills | Status: DC

## 2017-09-10 MED ORDER — AZITHROMYCIN 250 MG PO TABS: ORAL_TABLET | ORAL | 0 refills | Status: DC

## 2017-09-10 MED ORDER — UMECLIDINIUM-VILANTEROL 62.5-25 MCG/INH IN AEPB: 1.0000 | INHALATION_SPRAY | Freq: Every day | RESPIRATORY_TRACT | 0 refills | Status: DC

## 2017-09-10 NOTE — Assessment & Plan Note (Signed)
Better on Anoro Plan: Continue Anoro 1 puff once daily. Rinse mouth after use. We will send in a prescription. PFT's as scheduled Follow up in 2 weeks to make sure you are better. Note your daily symptoms > remember "red flags" for COPD:  Increase in cough, increase in sputum production, increase in shortness of breath or activity intolerance. If you notice these symptoms, please call to be seen.

## 2017-09-10 NOTE — Assessment & Plan Note (Signed)
Early sinusitis Plan: We will treat your sinuses with a Z pack Consider Activia yogurt for probiotic while on antibiotic. Continue your Zyrtec daily. Continue your Flonase daily. Continue Anoro 1 puff once daily. Rinse mouth after use. We will send in a prescription. Follow up with Dr. Marlow Baars as is scheduled for 8/25 PFT's as scheduled Follow up in 2 weeks to make sure you are better. Note your daily symptoms > remember "red flags" for COPD:  Increase in cough, increase in sputum production, increase in shortness of breath or activity intolerance. If you notice these symptoms, please call to be seen.

## 2017-09-10 NOTE — Patient Instructions (Addendum)
It is good to see you today. We will treat your sinuses with a Z pack Consider Activia yogurt for probiotic while on antibiotic. Continue your Zyrtec daily. Continue your Flonase daily. Continue Anoro 1 puff once daily. Rinse mouth after use. We will send in a prescription. Follow up with Dr. Marlow Baars as is scheduled for 8/25 PFT's as scheduled Follow up in 2 weeks to make sure you are better. Note your daily symptoms > remember "red flags" for COPD:  Increase in cough, increase in sputum production, increase in shortness of breath or activity intolerance. If you notice these symptoms, please call to be seen.

## 2017-09-10 NOTE — Progress Notes (Signed)
History of Present Illness Pamela Larson is a 72 y.o. female former smoker ( Quit 72 with a 40 pack year smoking history) with recurrent bronchitis. She is followed by Dr. Lake Bells.   Synopsis: Referred in July 2019 for recurrent bronchitis episodes ever since August 2015.; smoked 2 packs of cigarettes daily for 20 years, quit in 1992   09/10/2017  Acute OV: Pt. Presents for Acute OV. She was last seen by me 09/03/2017. At that time she felt she was improving from her most recent episode of Bronchitis. She was started on Anoro as a therapeutic trial to see if this helped with her shortness of breath, as we thought she may have some COPD as a  Result of her smoking history. She presents today stating that she feels she has the start of a sinus infection. Her sinuses are sore, she has increased mucus production which is cloudy white, she has cough and post nasal drip. She is compliant with her Zyrtec every morning. She is compliant with her Flonase at night. She feels the Trial of Anoro has been good. She feels less short of breath on the Anoro.She has taken Advair in the past which she tolerated well just acutely with a bronchitis flare.She does not want to add an inhaled corticosteroid at this point. She does have some sinus tenderness, which is her pre-curser for sinus infection. She wants to be proactive. She states Augmentin does not work well for her. Per her history, she does best with Z pack for her sinus infection.She denies fever, chest pain, orthopnea or hemoptysis.  Test Results: HRCT 08/27/2017 Impressions: No findings to suggest interstitial lung disease. Mild diffuse bronchial wall thickening with very mild paraseptal emphysema. Aortic atherosclerosis, in addition to left main and left anterior descending coronary artery disease. Please note that although the presence of coronary artery calcium documents the presence of coronary artery disease, the severity of this disease and  any potential stenosis cannot be assessed on this non-gated CT examination. Assessment for potential risk factor modification, dietary therapy or pharmacologic therapy may be warranted, if clinically indicated.  Spirometry done 08/20/2017>> Normal Spirometry  Micro 08/22/2017>> Sputum Culture>> Growth of normal oropharyngeal flora.   AFB>> Negative, final result pending Fungal>> Negative at present, final pending  CBC Latest Ref Rng & Units 03/27/2016 11/06/2014 11/02/2013  WBC 4.0 - 10.5 K/uL 6.1 8.5 7.5  Hemoglobin 12.0 - 15.0 g/dL 14.0 13.7 14.0  Hematocrit 36.0 - 46.0 % 42.2 41.1 41.0  Platelets 150 - 400 K/uL 328 305 310    BMP Latest Ref Rng & Units 03/27/2016 11/06/2014 11/02/2013  Glucose 65 - 99 mg/dL 86 87 115(H)  BUN 6 - 20 mg/dL 10 10 10   Creatinine 0.44 - 1.00 mg/dL 0.65 0.77 0.69  Sodium 135 - 145 mmol/L 139 139 139  Potassium 3.5 - 5.1 mmol/L 4.3 4.1 3.4(L)  Chloride 101 - 111 mmol/L 101 99(L) 99  CO2 22 - 32 mmol/L 25 27 22   Calcium 8.9 - 10.3 mg/dL 9.8 9.7 9.1    BNP No results found for: BNP  ProBNP No results found for: PROBNP  PFT No results found for: FEV1PRE, FEV1POST, FVCPRE, FVCPOST, TLC, DLCOUNC, PREFEV1FVCRT, PSTFEV1FVCRT  Ct Chest High Resolution  Result Date: 08/28/2017 CLINICAL DATA:  72 year old female with history of recurrent bronchitis 4 times in the past 2 years. Shortness of breath. EXAM: CT CHEST WITHOUT CONTRAST TECHNIQUE: Multidetector CT imaging of the chest was performed following the standard protocol without intravenous contrast.  High resolution imaging of the lungs, as well as inspiratory and expiratory imaging, was performed. COMPARISON:  No priors. FINDINGS: Cardiovascular: Heart size is normal. There is no significant pericardial fluid, thickening or pericardial calcification. There is aortic atherosclerosis, as well as atherosclerosis of the great vessels of the mediastinum and the coronary arteries, including calcified  atherosclerotic plaque in the left main and left anterior descending coronary arteries. Mediastinum/Nodes: No pathologically enlarged mediastinal or hilar lymph nodes. Please note that accurate exclusion of hilar adenopathy is limited on noncontrast CT scans. Esophagus is unremarkable in appearance. No axillary lymphadenopathy. Lungs/Pleura: High-resolution images demonstrate no significant regions of ground-glass attenuation, subpleural reticulation, parenchymal banding, traction bronchiectasis or frank honeycombing. Inspiratory and expiratory images demonstrates some mild air trapping indicative of small airways disease. Azygos lobe (normal anatomical variant) incidentally noted. No acute consolidative airspace disease. No pleural effusions. Mild diffuse bronchial wall thickening with very mild paraseptal emphysema most apparent in the lung apices. Upper Abdomen: Aortic atherosclerosis. Two low-attenuation lesions are noted in the liver, largest of which is in segment 4B measuring up to 19 mm in diameter, incompletely characterized on today's noncontrast CT examination, but statistically likely to represent cysts. Aortic atherosclerosis. Musculoskeletal: Chronic compression fracture of L1 with 30% loss of anterior vertebral body height. There are no aggressive appearing lytic or blastic lesions noted in the visualized portions of the skeleton. IMPRESSION: 1. No findings to suggest interstitial lung disease. 2. Mild diffuse bronchial wall thickening with very mild paraseptal emphysema. 3. Aortic atherosclerosis, in addition to left main and left anterior descending coronary artery disease. Please note that although the presence of coronary artery calcium documents the presence of coronary artery disease, the severity of this disease and any potential stenosis cannot be assessed on this non-gated CT examination. Assessment for potential risk factor modification, dietary therapy or pharmacologic therapy may be  warranted, if clinically indicated. 4. Additional incidental findings, as above. Aortic Atherosclerosis (ICD10-I70.0) and Emphysema (ICD10-J43.9). Electronically Signed   By: Vinnie Langton M.D.   On: 08/28/2017 10:55     Past medical hx Past Medical History:  Diagnosis Date  . Anemia    pernicious anemia - 1991   . Anxiety   . Arthritis    rheumatoid arthritis   . Chronic fatigue   . Cystitis, interstitial   . Depression   . Disc disorder of lumbar region 2013  . Fibromyalgia   . GERD (gastroesophageal reflux disease)   . History of kidney stones   . Hypothyroidism   . Lyme disease    hx of possible   . 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 Osteoporosis   . Pneumonia    hx of   . Rheumatoid arthritis ([place])      Social History   Tobacco Use  . Smoking status: Former Smoker    Packs/day: 2.00    Years: 20.00    Pack years: 40.00    Types: Cigarettes    Last attempt to quit: 02/11/1990    Years since quitting: 27.5  . Smokeless tobacco: Never Used  Substance Use Topics  . Alcohol use: Yes    Alcohol/week: 8.4 oz    Types: 14 Standard drinks or equivalent per week    Comment: drink per nite scotch  . Drug use: No    Pamela Larson reports that she quit smoking about 27 years ago. Her smoking use included cigarettes. She has a 40.00 pack-year smoking history.  She has never used smokeless tobacco. She reports that she drinks about 8.4 oz of alcohol per week. She reports that she does not use drugs.  Tobacco Cessation: Former smoker Quit 1992 with a 40 pack year smoking history  Past surgical hx, Family hx, Social hx all reviewed.  Current Outpatient Medications on File Prior to Visit  Medication Sig  . acetaminophen (TYLENOL) 500 MG tablet Take 1,000 mg by mouth every 6 (six) hours as needed for mild pain. For pain  . ALPRAZolam (XANAX) 0.25 MG tablet Take 0.125 mg by mouth daily as needed for anxiety. Pt takes 1/2  tablet as needed   . calcium carbonate (OS-CAL - DOSED IN MG OF ELEMENTAL CALCIUM) 1250 MG tablet Take 1 tablet by mouth daily.  . fluticasone (FLONASE) 50 MCG/ACT nasal spray Place 1 spray into both nostrils daily as needed for allergies or rhinitis.  Marland Kitchen levothyroxine (SYNTHROID, LEVOTHROID) 100 MCG tablet Take 50-100 mcg by mouth See admin instructions. TAKES 100 MCG DAILY EXCEPT ON THURSDAYS AND TAKES 50 MCG  . Melatonin 3 MG CAPS Take 3 mg by mouth at bedtime as needed (SLEEP).  . naproxen sodium (ANAPROX) 220 MG tablet Take 440 mg by mouth 2 (two) times daily as needed (PAIN).  Marland Kitchen omeprazole (PRILOSEC) 20 MG capsule Take 20 mg by mouth daily as needed. For indigestion  . sodium chloride (OCEAN) 0.65 % SOLN nasal spray Place 1 spray into both nostrils 2 (two) times daily as needed for congestion.  . Vitamin D, Ergocalciferol, (DRISDOL) 50000 UNITS CAPS Take 50,000 Units by mouth every 7 (seven) days. On Mondays  . albuterol (PROVENTIL HFA;VENTOLIN HFA) 108 (90 BASE) MCG/ACT inhaler Inhale 2 puffs into the lungs every 6 (six) hours as needed for wheezing or shortness of breath. For shortness of breath   . guaiFENesin (MUCINEX) 600 MG 12 hr tablet Take 600 mg by mouth 2 (two) times daily as needed for to loosen phlegm.   No current facility-administered medications on file prior to visit.      Allergies  Allergen Reactions  . Amoxicillin Nausea And Vomiting     Has patient had a PCN reaction causing immediate rash, facial/tongue/throat swelling, SOB or lightheadedness with hypotension: #  #  #  YES  #  #  #  Has patient had a PCN reaction causing severe rash involving mucus membranes or skin necrosis: No Has patient had a PCN reaction that required hospitalization No Has patient had a PCN reaction occurring within the last 10 years: #  #  #  YES  #  #  #  If all of the above answers are "NO", then may proceed with Cephalosporin use.  . Augmentin [Amoxicillin-Pot Clavulanate] Nausea And  Vomiting  . Demerol [Meperidine Hcl] Nausea And Vomiting  . Sulfa Antibiotics Nausea And Vomiting    Review Of Systems:  Constitutional:   No  weight loss, night sweats,  Fevers, chills, fatigue, or  lassitude.  HEENT:   No headaches,  Difficulty swallowing,  Tooth/dental problems, or  Sore throat,                No sneezing, itching, ear ache, +nasal congestion,+ post nasal drip,   CV:  No chest pain,  Orthopnea, PND, swelling in lower extremities, anasarca, dizziness, palpitations, syncope.   GI  No heartburn, indigestion, abdominal pain, nausea, vomiting, diarrhea, change in bowel habits, loss of appetite, bloody stools.   Resp: No shortness of breath with exertion or at rest.  +  excess mucus, no productive cough, + non-productive cough,  No coughing up of blood.  + change in color of mucus.  No wheezing.  No chest wall deformity  Skin: no rash or lesions.  GU: no dysuria, change in color of urine, no urgency or frequency.  No flank pain, no hematuria   MS:  No joint pain or swelling.  No decreased range of motion.  No back pain.  Psych:  No change in mood or affect. No depression or anxiety.  No memory loss.   Vital Signs BP 132/84 (BP Location: Left Arm, Cuff Size: Normal)   Pulse 81   Ht 5' (1.524 m)   Wt 104 lb 3.2 oz (47.3 kg)   SpO2 99%   BMI 20.35 kg/m    Physical Exam:  General- No distress,  A&Ox3, pleaant ENT: + sinus tenderness, No TM clear, pale nasal mucosa, no oral exudate,+ post nasal drip, no LAN Cardiac: S1, S2, regular rate and rhythm, no murmur Chest: No wheeze/ rales/ dullness; no accessory muscle use, no nasal flaring, no sternal retractions Abd.: Soft Non-tender, ND, BS +, Body mass index is 20.35 kg/m. Ext: No clubbing cyanosis, edema Neuro:  normal strength, MAE x 4, A&O x 3 Skin: No rashes, warm and dry Psych: normal mood and behavior   Assessment/Plan  Sinusitis Early sinusitis Plan: We will treat your sinuses with a Z  pack Consider Activia yogurt for probiotic while on antibiotic. Continue your Zyrtec daily. Continue your Flonase daily. Continue Anoro 1 puff once daily. Rinse mouth after use. We will send in a prescription. Follow up with Dr. Marlow Baars as is scheduled for 8/25 PFT's as scheduled Follow up in 2 weeks to make sure you are better. Note your daily symptoms > remember "red flags" for COPD:  Increase in cough, increase in sputum production, increase in shortness of breath or activity intolerance. If you notice these symptoms, please call to be seen.     COPD with chronic bronchitis and emphysema ([place]) Better on Anoro Plan: Continue Anoro 1 puff once daily. Rinse mouth after use. We will send in a prescription. PFT's as scheduled Follow up in 2 weeks to make sure you are better. Note your daily symptoms > remember "red flags" for COPD:  Increase in cough, increase in sputum production, increase in shortness of breath or activity intolerance. If you notice these symptoms, please call to be seen.       [name], NP 09/10/2017  4:44 PM

## 2017-09-11 DIAGNOSIS — M7542 Impingement syndrome of left shoulder: Secondary | ICD-10-CM | POA: Diagnosis not present

## 2017-09-11 DIAGNOSIS — M25512 Pain in left shoulder: Secondary | ICD-10-CM | POA: Diagnosis not present

## 2017-09-11 DIAGNOSIS — M19012 Primary osteoarthritis, left shoulder: Secondary | ICD-10-CM | POA: Diagnosis not present

## 2017-09-16 DIAGNOSIS — H02135 Senile ectropion of left lower eyelid: Secondary | ICD-10-CM | POA: Diagnosis not present

## 2017-09-16 DIAGNOSIS — H02832 Dermatochalasis of right lower eyelid: Secondary | ICD-10-CM | POA: Diagnosis not present

## 2017-09-16 DIAGNOSIS — H02835 Dermatochalasis of left lower eyelid: Secondary | ICD-10-CM | POA: Diagnosis not present

## 2017-09-16 DIAGNOSIS — H02132 Senile ectropion of right lower eyelid: Secondary | ICD-10-CM | POA: Diagnosis not present

## 2017-09-24 ENCOUNTER — Encounter: Payer: Self-pay | Admitting: Primary Care

## 2017-09-24 ENCOUNTER — Ambulatory Visit (INDEPENDENT_AMBULATORY_CARE_PROVIDER_SITE_OTHER): Payer: Medicare Other | Admitting: Primary Care

## 2017-09-24 DIAGNOSIS — J449 Chronic obstructive pulmonary disease, unspecified: Secondary | ICD-10-CM | POA: Diagnosis not present

## 2017-09-24 DIAGNOSIS — J0110 Acute frontal sinusitis, unspecified: Secondary | ICD-10-CM | POA: Diagnosis not present

## 2017-09-24 NOTE — Progress Notes (Signed)
@Patient  ID: Pamela Larson, female    DOB: [birth year], 72 y.o.   MRN: [id number]  Chief Complaint  Patient presents with  . Follow-up    better    Referring provider: Marton Redwood, MD  HPI: 72 year old female. Former smoker 2 ppd x 20 years (quit 1992). Patient of Dr. Lake Bells, originally seen on 07/10 for chronic sinusitis with recurrent bronchitis. HRCT showed no evidence of bronchiectasis or ILD, mild diffuse bronchial wall thickening with very mild paraseptal emphysema. Sputum with normal oropharyngeal flora. AFB negative. Seen by pulmonary NP on 7/31 for acute sinusitis treated with zpack. Augmentin has not worked in the past.   09/24/2017  Feels much better today, symptoms have resolved. Completed zpack, continues Anoro. Started taking oral antihistamine and Flonase regularly over the last 1-2 months. Denies sob or wheezing. She has an upcoming apt with Allergy, Dr. Alphonsa Gin. Will have PFTs on September 25th.   Allergies  Allergen Reactions  . Amoxicillin Nausea And Vomiting     Has patient had a PCN reaction causing immediate rash, facial/tongue/throat swelling, SOB or lightheadedness with hypotension: #  #  #  YES  #  #  #  Has patient had a PCN reaction causing severe rash involving mucus membranes or skin necrosis: No Has patient had a PCN reaction that required hospitalization No Has patient had a PCN reaction occurring within the last 10 years: #  #  #  YES  #  #  #  If all of the above answers are "NO", then may proceed with Cephalosporin use.  . Augmentin [Amoxicillin-Pot Clavulanate] Nausea And Vomiting  . Demerol [Meperidine Hcl] Nausea And Vomiting  . Sulfa Antibiotics Nausea And Vomiting    Immunization History  Administered Date(s) Administered  . Influenza, High Dose Seasonal PF 12/07/2016  . Tdap 11/01/2016  . Zoster Recombinat (Shingrix) 12/26/2016, 06/12/2017    Past Medical History:  Diagnosis Date  . Anemia    pernicious anemia - 1991   . Anxiety     . Arthritis    rheumatoid arthritis   . Chronic fatigue   . Cystitis, interstitial   . Depression   . Disc disorder of lumbar region 2013  . Fibromyalgia   . GERD (gastroesophageal reflux disease)   . History of kidney stones   . Hypothyroidism   . Lyme disease    hx of possible   . 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 Osteoporosis   . Pneumonia    hx of   . Rheumatoid arthritis ([place])     Tobacco History: Social History   Tobacco Use  Smoking Status Former Smoker  . Packs/day: 2.00  . Years: 20.00  . Pack years: 40.00  . Types: Cigarettes  . Last attempt to quit: 02/11/1990  . Years since quitting: 27.6  Smokeless Tobacco Never Used   Counseling given: Not Answered   Outpatient Medications Prior to Visit  Medication Sig Dispense Refill  . acetaminophen (TYLENOL) 500 MG tablet Take 1,000 mg by mouth every 6 (six) hours as needed for mild pain. For pain    . albuterol (PROVENTIL HFA;VENTOLIN HFA) 108 (90 BASE) MCG/ACT inhaler Inhale 2 puffs into the lungs every 6 (six) hours as needed for wheezing or shortness of breath. For shortness of breath     . ALPRAZolam (XANAX) 0.25 MG tablet Take 0.125 mg by mouth daily as needed for anxiety. Pt takes 1/2 tablet  as needed     . calcium carbonate (OS-CAL - DOSED IN MG OF ELEMENTAL CALCIUM) 1250 MG tablet Take 1 tablet by mouth daily.    . fluticasone (FLONASE) 50 MCG/ACT nasal spray Place 1 spray into both nostrils daily as needed for allergies or rhinitis.    Marland Kitchen guaiFENesin (MUCINEX) 600 MG 12 hr tablet Take 600 mg by mouth 2 (two) times daily as needed for to loosen phlegm.    Marland Kitchen levothyroxine (SYNTHROID, LEVOTHROID) 100 MCG tablet Take 50-100 mcg by mouth See admin instructions. TAKES 100 MCG DAILY EXCEPT ON THURSDAYS AND TAKES 50 MCG    . Melatonin 3 MG CAPS Take 3 mg by mouth at bedtime as needed (SLEEP).    . naproxen sodium (ANAPROX) 220 MG tablet Take 440 mg by mouth 2 (two)  times daily as needed (PAIN).    Marland Kitchen omeprazole (PRILOSEC) 20 MG capsule Take 20 mg by mouth daily as needed. For indigestion    . sodium chloride (OCEAN) 0.65 % SOLN nasal spray Place 1 spray into both nostrils 2 (two) times daily as needed for congestion.    Marland Kitchen umeclidinium-vilanterol (ANORO ELLIPTA) 62.5-25 MCG/INH AEPB Inhale 1 puff into the lungs daily. 60 each 5  . umeclidinium-vilanterol (ANORO ELLIPTA) 62.5-25 MCG/INH AEPB Inhale 1 puff into the lungs daily. 2 each 0  . Vitamin D, Ergocalciferol, (DRISDOL) 50000 UNITS CAPS Take 50,000 Units by mouth every 7 (seven) days. On Mondays    . azithromycin (ZITHROMAX) 250 MG tablet Take 2 pills today then one a day for 4 additional days (Patient not taking: Reported on 09/24/2017) 6 tablet 0   No facility-administered medications prior to visit.      Review of Systems  Review of Systems  Constitutional: Negative.   HENT: Negative.   Respiratory: Negative.   Cardiovascular: Negative.   Gastrointestinal: Negative.   Skin: Negative.   Allergic/Immunologic: Negative.   Neurological: Negative.   Psychiatric/Behavioral: Negative.     Physical Exam  BP 134/78   Pulse 77   Ht 5' 1.5" (1.562 m)   Wt 103 lb 3.2 oz (46.8 kg)   SpO2 100%   BMI 19.18 kg/m  Physical Exam  Constitutional: She is oriented to person, place, and time. She appears well-developed and well-nourished.  HENT:  Head: Normocephalic and atraumatic.  Eyes: Pupils are equal, round, and reactive to light. EOM are normal.  Neck: Normal range of motion. Neck supple.  Cardiovascular: Normal rate, regular rhythm and normal heart sounds.  No murmur heard. Pulmonary/Chest: Effort normal and breath sounds normal. No respiratory distress. She has no wheezes.  Abdominal: Soft. Bowel sounds are normal. There is no tenderness.  Neurological: She is alert and oriented to person, place, and time.  Skin: Skin is warm and dry. No rash noted. No erythema.  Psychiatric: She has a  normal mood and affect. Her behavior is normal. Judgment normal.     Lab Results:  CBC    Component Value Date/Time   WBC 6.1 03/27/2016 1146   RBC 4.08 03/27/2016 1146   HGB 14.0 03/27/2016 1146   HCT 42.2 03/27/2016 1146   PLT 328 03/27/2016 1146   MCV 103.4 (H) 03/27/2016 1146   MCV 105.8 (A) 02/12/2012 1140   MCH 34.3 (H) 03/27/2016 1146   MCHC 33.2 03/27/2016 1146   RDW 13.6 03/27/2016 1146   LYMPHSABS 1.9 11/02/2013 0432   MONOABS 0.5 11/02/2013 0432   EOSABS 0.1 11/02/2013 0432   BASOSABS 0.0 11/02/2013 0432  BMET    Component Value Date/Time   NA 139 03/27/2016 1146   K 4.3 03/27/2016 1146   CL 101 03/27/2016 1146   CO2 25 03/27/2016 1146   GLUCOSE 86 03/27/2016 1146   BUN 10 03/27/2016 1146   CREATININE 0.65 03/27/2016 1146   CALCIUM 9.8 03/27/2016 1146   GFRNONAA >60 03/27/2016 1146   GFRAA >60 03/27/2016 1146    BNP No results found for: BNP  ProBNP No results found for: PROBNP  Imaging: Ct Chest High Resolution  Result Date: 08/28/2017 CLINICAL DATA:  72 year old female with history of recurrent bronchitis 4 times in the past 2 years. Shortness of breath. EXAM: CT CHEST WITHOUT CONTRAST TECHNIQUE: Multidetector CT imaging of the chest was performed following the standard protocol without intravenous contrast. High resolution imaging of the lungs, as well as inspiratory and expiratory imaging, was performed. COMPARISON:  No priors. FINDINGS: Cardiovascular: Heart size is normal. There is no significant pericardial fluid, thickening or pericardial calcification. There is aortic atherosclerosis, as well as atherosclerosis of the great vessels of the mediastinum and the coronary arteries, including calcified atherosclerotic plaque in the left main and left anterior descending coronary arteries. Mediastinum/Nodes: No pathologically enlarged mediastinal or hilar lymph nodes. Please note that accurate exclusion of hilar adenopathy is limited on noncontrast CT  scans. Esophagus is unremarkable in appearance. No axillary lymphadenopathy. Lungs/Pleura: High-resolution images demonstrate no significant regions of ground-glass attenuation, subpleural reticulation, parenchymal banding, traction bronchiectasis or frank honeycombing. Inspiratory and expiratory images demonstrates some mild air trapping indicative of small airways disease. Azygos lobe (normal anatomical variant) incidentally noted. No acute consolidative airspace disease. No pleural effusions. Mild diffuse bronchial wall thickening with very mild paraseptal emphysema most apparent in the lung apices. Upper Abdomen: Aortic atherosclerosis. Two low-attenuation lesions are noted in the liver, largest of which is in segment 4B measuring up to 19 mm in diameter, incompletely characterized on today's noncontrast CT examination, but statistically likely to represent cysts. Aortic atherosclerosis. Musculoskeletal: Chronic compression fracture of L1 with 30% loss of anterior vertebral body height. There are no aggressive appearing lytic or blastic lesions noted in the visualized portions of the skeleton. IMPRESSION: 1. No findings to suggest interstitial lung disease. 2. Mild diffuse bronchial wall thickening with very mild paraseptal emphysema. 3. Aortic atherosclerosis, in addition to left main and left anterior descending coronary artery disease. Please note that although the presence of coronary artery calcium documents the presence of coronary artery disease, the severity of this disease and any potential stenosis cannot be assessed on this non-gated CT examination. Assessment for potential risk factor modification, dietary therapy or pharmacologic therapy may be warranted, if clinically indicated. 4. Additional incidental findings, as above. Aortic Atherosclerosis (ICD10-I70.0) and Emphysema (ICD10-J43.9). Electronically Signed   By: Vinnie Langton M.D.   On: 08/28/2017 10:55     Assessment & Plan:    Sinusitis - Symptoms have resolved since 7/31 visit for acute sinusitis  - Completed zpack  - Continues flonase and antihistamine - Has upcoming apt with allergy this month  - Enc good hand hygiene, take vit C and zinc at start of viral cold   COPD with chronic bronchitis and emphysema ([place]) - Breathing is stable, denies sob or wheezing - Continues Anoro - Needs PFTs, scheduled for September with follow-up visit after      Martyn Ehrich, NP 09/24/2017

## 2017-09-24 NOTE — Assessment & Plan Note (Signed)
-   Breathing is stable, denies sob or wheezing - Continues Anoro - Needs PFTs, scheduled for September with follow-up visit after

## 2017-09-24 NOTE — Patient Instructions (Addendum)
Follow up with allergist and then complete PFTs  Next visit 9/25 with [facility]/ Dr. Lake Bells   At start of cold take Vit C and Zinc (over the counter)

## 2017-09-24 NOTE — Assessment & Plan Note (Addendum)
-   Symptoms have resolved since 7/31 visit for acute sinusitis  - Completed zpack  - Continues flonase and antihistamine - Has upcoming apt with allergy this month  - Enc good hand hygiene, take vit C and zinc at start of viral cold

## 2017-09-25 ENCOUNTER — Ambulatory Visit (INDEPENDENT_AMBULATORY_CARE_PROVIDER_SITE_OTHER): Payer: Medicare Other | Admitting: Pulmonary Disease

## 2017-09-25 ENCOUNTER — Encounter: Payer: Self-pay | Admitting: Pulmonary Disease

## 2017-09-25 ENCOUNTER — Telehealth: Payer: Self-pay | Admitting: Primary Care

## 2017-09-25 DIAGNOSIS — J069 Acute upper respiratory infection, unspecified: Secondary | ICD-10-CM | POA: Diagnosis not present

## 2017-09-25 DIAGNOSIS — J449 Chronic obstructive pulmonary disease, unspecified: Secondary | ICD-10-CM | POA: Diagnosis not present

## 2017-09-25 DIAGNOSIS — J0110 Acute frontal sinusitis, unspecified: Secondary | ICD-10-CM | POA: Diagnosis not present

## 2017-09-25 DIAGNOSIS — J309 Allergic rhinitis, unspecified: Secondary | ICD-10-CM

## 2017-09-25 NOTE — Telephone Encounter (Signed)
Spoke with the Pamela Larson  She states woke up today feeling very lethargic  She tried going outside and had to come back in b/c she was so SOB  She states this is a new symptom for her  I advised needs ov for eval  Appt with Aaron Edelman at 4:15 today

## 2017-09-25 NOTE — Progress Notes (Signed)
@Patient  ID: Pamela Larson, female    DOB: 1945-08-28, 72 y.o.   MRN: [id number]  Chief Complaint  Patient presents with  . Acute Visit    Martin Majestic out to cut flowers and noticed her chest felt very heavy and increased drainage.     Referring provider: Marton Redwood, MD  HPI: 72 year old female former smoker (quit 1992, 40-pack-year).  Referred to our office on 08/20/2017 for recurrent bronchitis episodes ever since August 2015.   PMH: Smoker/ Smoking History:  Maintenance:   Pt of:   Recent [facility] Encounters:   08/20/2017- initial office visit-[facility]  patient reporting back in 2015 she had a serious lung infection.  Since then has been to the doctor about every 6 months where she gets sick.  Patient reports she had Lyme disease diagnosed back in 1991.  She says she had bronchitis-like kids do when she was a child typically about every other year.  Patient with a 40-pack-year smoking history.  Chest x-ray from August 2015 showed possible emphysema and airway thickening in right lower lobe. Plan: High-res CT, pulmonary function test, sputum cultures, follow-up in 2 weeks  09/03/2017- office visit-SG Patient reviewed high-res CT, spirometry as well as sputum cultures today.  Patient with nasal congestion.  CT high-res was negative for bronchiectasis but did show some emphysema. Plan: Referral to allergy (Dr. Neldon Mc), therapeutic trial of Anoro Ellipta, scheduled for PFTs, follow-up in 2 months with Dr. Lake Bells, continue Zyrtec  09/10/17- OV - SG  Early sinusitis, treated with Z-Pak, continue Anoro  09/24/2017-office visit-BW Patient doing better   Tests:   08/20/2017-spirometry-normal spirometry 08/22/2017- sputum culture, AFB, fungal all negative 08/27/2017-CT chest high-res- no ILD, mild diffuse bronchial wall thickening with very mild paraseptal emphysema  PFT >>>  Chart Review:    09/25/17 Acute 72 year old patient presents office today after last being seen in  our office on 09/24/2017.  Patient reports increased shortness of breath and chest tightness over the last 24 hours.  Patient attributes this to being outside more as well as being around cutting flowers.  Patient feels more fatigued.  Patient has had chills and subjective fevers but no recorded fevers.  Patient reports known sick contacts to grandson, who recently had a viral cold.   Patient reports upcoming appointment 10/05/2017 with Dr. Neldon Mc for allergy.   Allergies  Allergen Reactions  . Amoxicillin Nausea And Vomiting     Has patient had a PCN reaction causing immediate rash, facial/tongue/throat swelling, SOB or lightheadedness with hypotension: #  #  #  YES  #  #  #  Has patient had a PCN reaction causing severe rash involving mucus membranes or skin necrosis: No Has patient had a PCN reaction that required hospitalization No Has patient had a PCN reaction occurring within the last 10 years: #  #  #  YES  #  #  #  If all of the above answers are "NO", then may proceed with Cephalosporin use.  . Augmentin [Amoxicillin-Pot Clavulanate] Nausea And Vomiting  . Demerol [Meperidine Hcl] Nausea And Vomiting  . Sulfa Antibiotics Nausea And Vomiting    Immunization History  Administered Date(s) Administered  . Influenza, High Dose Seasonal PF 12/07/2016  . Tdap 11/01/2016  . Zoster Recombinat (Shingrix) 12/26/2016, 06/12/2017  Needs pneumonia vaccines as well as flu vaccine  Past Medical History:  Diagnosis Date  . Anemia    pernicious anemia - 1991   . Anxiety   . Arthritis    rheumatoid arthritis   .  Chronic fatigue   . Cystitis, interstitial   . Depression   . Disc disorder of lumbar region 2013  . Fibromyalgia   . GERD (gastroesophageal reflux disease)   . History of kidney stones   . Hypothyroidism   . Lyme disease    hx of possible   . 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 Osteoporosis   . Pneumonia    hx of   .  Rheumatoid arthritis ([place])     Tobacco History: Social History   Tobacco Use  Smoking Status Former Smoker  . Packs/day: 2.00  . Years: 20.00  . Pack years: 40.00  . Types: Cigarettes  . Last attempt to quit: 02/11/1990  . Years since quitting: 27.6  Smokeless Tobacco Never Used   Counseling given: Not Answered Continue not smoking  Outpatient Encounter Medications as of 09/25/2017  Medication Sig  . acetaminophen (TYLENOL) 500 MG tablet Take 1,000 mg by mouth every 6 (six) hours as needed for mild pain. For pain  . albuterol (PROVENTIL HFA;VENTOLIN HFA) 108 (90 BASE) MCG/ACT inhaler Inhale 2 puffs into the lungs every 6 (six) hours as needed for wheezing or shortness of breath. For shortness of breath   . ALPRAZolam (XANAX) 0.25 MG tablet Take 0.125 mg by mouth daily as needed for anxiety. Pt takes 1/2 tablet as needed   . calcium carbonate (OS-CAL - DOSED IN MG OF ELEMENTAL CALCIUM) 1250 MG tablet Take 1 tablet by mouth daily.  . fluticasone (FLONASE) 50 MCG/ACT nasal spray Place 1 spray into both nostrils daily as needed for allergies or rhinitis.  Marland Kitchen guaiFENesin (MUCINEX) 600 MG 12 hr tablet Take 600 mg by mouth 2 (two) times daily as needed for to loosen phlegm.  Marland Kitchen levothyroxine (SYNTHROID, LEVOTHROID) 100 MCG tablet Take 50-100 mcg by mouth See admin instructions. TAKES 100 MCG DAILY EXCEPT ON THURSDAYS AND TAKES 50 MCG  . Melatonin 3 MG CAPS Take 3 mg by mouth at bedtime as needed (SLEEP).  . naproxen sodium (ANAPROX) 220 MG tablet Take 440 mg by mouth 2 (two) times daily as needed (PAIN).  Marland Kitchen omeprazole (PRILOSEC) 20 MG capsule Take 20 mg by mouth daily as needed. For indigestion  . sodium chloride (OCEAN) 0.65 % SOLN nasal spray Place 1 spray into both nostrils 2 (two) times daily as needed for congestion.  Marland Kitchen umeclidinium-vilanterol (ANORO ELLIPTA) 62.5-25 MCG/INH AEPB Inhale 1 puff into the lungs daily.  . Vitamin D, Ergocalciferol, (DRISDOL) 50000 UNITS CAPS Take 50,000 Units  by mouth every 7 (seven) days. On Mondays  . [DISCONTINUED] umeclidinium-vilanterol (ANORO ELLIPTA) 62.5-25 MCG/INH AEPB Inhale 1 puff into the lungs daily. (Patient not taking: Reported on 09/25/2017)   No facility-administered encounter medications on file as of 09/25/2017.      Review of Systems  Review of Systems  Constitutional: Positive for chills and fatigue. Negative for fever (subjective fevers - feels hot ) and unexpected weight change.  HENT: Negative for congestion, ear pain, postnasal drip and rhinorrhea.   Respiratory: Positive for cough (dry cough ), chest tightness and shortness of breath. Negative for wheezing.   Cardiovascular: Negative for chest pain and palpitations.  Gastrointestinal: Negative for blood in stool, diarrhea, nausea and vomiting.  Genitourinary: Negative for dysuria, frequency and urgency.  Musculoskeletal: Negative for arthralgias.  Skin: Negative for color change.  Allergic/Immunologic: Positive for environmental allergies. Negative for food allergies.  Neurological: Negative for dizziness, light-headedness and headaches.  Psychiatric/Behavioral: Negative for dysphoric mood. The patient is not nervous/anxious.      Physical Exam  BP 128/72   Pulse 87   Ht 5\' 1"  (1.549 m)   Wt 103 lb 9.6 oz (47 kg)   SpO2 95%   BMI 19.58 kg/m   Wt Readings from Last 5 Encounters:  09/25/17 103 lb 9.6 oz (47 kg)  09/24/17 103 lb 3.2 oz (46.8 kg)  09/10/17 104 lb 3.2 oz (47.3 kg)  08/20/17 106 lb (48.1 kg)  05/08/17 97 lb 12.8 oz (44.4 kg)     Physical Exam  Constitutional: She is oriented to person, place, and time and well-developed, well-nourished, and in no distress. No distress.  HENT:  Head: Normocephalic and atraumatic.  Right Ear: Hearing, tympanic membrane, external ear and ear canal normal.  Left Ear: Hearing, tympanic membrane, external ear and ear canal normal.  Nose: Rhinorrhea present. Right sinus exhibits no maxillary sinus tenderness and  no frontal sinus tenderness. Left sinus exhibits no maxillary sinus tenderness and no frontal sinus tenderness.  Mouth/Throat: Uvula is midline and oropharynx is clear and moist. No oropharyngeal exudate.  TMs with effusion bilaterally without infection  Eyes: Pupils are equal, round, and reactive to light.  Neck: Normal range of motion. Neck supple. No JVD present.  Cardiovascular: Normal rate, regular rhythm and normal heart sounds.  Pulmonary/Chest: Effort normal and breath sounds normal. No accessory muscle usage. No respiratory distress. She has no decreased breath sounds. She has no wheezes. She has no rhonchi.  Abdominal: Soft. Bowel sounds are normal. There is no tenderness.  Musculoskeletal: Normal range of motion. She exhibits no edema.  Lymphadenopathy:    She has no cervical adenopathy.  Neurological: She is alert and oriented to person, place, and time. Gait normal.  Skin: Skin is warm and dry. She is not diaphoretic. No erythema.  Psychiatric: Memory, affect and judgment normal. Her mood appears anxious.  Nursing note and vitals reviewed.     Lab Results:  CBC    Component Value Date/Time   WBC 6.1 03/27/2016 1146   RBC 4.08 03/27/2016 1146   HGB 14.0 03/27/2016 1146   HCT 42.2 03/27/2016 1146   PLT 328 03/27/2016 1146   MCV 103.4 (H) 03/27/2016 1146   MCV 105.8 (A) 02/12/2012 1140   MCH 34.3 (H) 03/27/2016 1146   MCHC 33.2 03/27/2016 1146   RDW 13.6 03/27/2016 1146   LYMPHSABS 1.9 11/02/2013 0432   MONOABS 0.5 11/02/2013 0432   EOSABS 0.1 11/02/2013 0432   BASOSABS 0.0 11/02/2013 0432    BMET    Component Value Date/Time   NA 139 03/27/2016 1146   K 4.3 03/27/2016 1146   CL 101 03/27/2016 1146   CO2 25 03/27/2016 1146   GLUCOSE 86 03/27/2016 1146   BUN 10 03/27/2016 1146   CREATININE 0.65 03/27/2016 1146   CALCIUM 9.8 03/27/2016 1146   GFRNONAA >60 03/27/2016 1146   GFRAA >60 03/27/2016 1146    BNP No results found for: BNP  ProBNP No results  found for: PROBNP  Imaging: Ct Chest High Resolution  Result Date: 08/28/2017 CLINICAL DATA:  72 year old female with history of recurrent bronchitis 4 times in the past 2 years. Shortness of breath. EXAM: CT CHEST WITHOUT CONTRAST TECHNIQUE: Multidetector CT imaging of the chest was performed following the standard protocol without intravenous contrast. High resolution imaging of the lungs, as well as inspiratory and expiratory imaging, was performed. COMPARISON:  No priors. FINDINGS: Cardiovascular: Heart size is normal.  There is no significant pericardial fluid, thickening or pericardial calcification. There is aortic atherosclerosis, as well as atherosclerosis of the great vessels of the mediastinum and the coronary arteries, including calcified atherosclerotic plaque in the left main and left anterior descending coronary arteries. Mediastinum/Nodes: No pathologically enlarged mediastinal or hilar lymph nodes. Please note that accurate exclusion of hilar adenopathy is limited on noncontrast CT scans. Esophagus is unremarkable in appearance. No axillary lymphadenopathy. Lungs/Pleura: High-resolution images demonstrate no significant regions of ground-glass attenuation, subpleural reticulation, parenchymal banding, traction bronchiectasis or frank honeycombing. Inspiratory and expiratory images demonstrates some mild air trapping indicative of small airways disease. Azygos lobe (normal anatomical variant) incidentally noted. No acute consolidative airspace disease. No pleural effusions. Mild diffuse bronchial wall thickening with very mild paraseptal emphysema most apparent in the lung apices. Upper Abdomen: Aortic atherosclerosis. Two low-attenuation lesions are noted in the liver, largest of which is in segment 4B measuring up to 19 mm in diameter, incompletely characterized on today's noncontrast CT examination, but statistically likely to represent cysts. Aortic atherosclerosis. Musculoskeletal: Chronic  compression fracture of L1 with 30% loss of anterior vertebral body height. There are no aggressive appearing lytic or blastic lesions noted in the visualized portions of the skeleton. IMPRESSION: 1. No findings to suggest interstitial lung disease. 2. Mild diffuse bronchial wall thickening with very mild paraseptal emphysema. 3. Aortic atherosclerosis, in addition to left main and left anterior descending coronary artery disease. Please note that although the presence of coronary artery calcium documents the presence of coronary artery disease, the severity of this disease and any potential stenosis cannot be assessed on this non-gated CT examination. Assessment for potential risk factor modification, dietary therapy or pharmacologic therapy may be warranted, if clinically indicated. 4. Additional incidental findings, as above. Aortic Atherosclerosis (ICD10-I70.0) and Emphysema (ICD10-J43.9). Electronically Signed   By: Vinnie Langton M.D.   On: 08/28/2017 10:55     Assessment & Plan:   Pleasant 72 year old patient seen office today.  Patient with probable viral URI.  Known sick contacts to grandchild.  FeNO performed today was 7.  Encourage patient to keep follow-up with allergy Dr. Neldon Mc.  We will see if we can get patient rescheduled to complete PFTs sooner than 11/05/2017.  Patient to keep follow-up with Dr. Lake Bells on that date.  Encouraged patient to add Chlotab tablets at night (1 to 2 4mg  tablets) as needed for cough and postnasal drip.  Continue to take daily Zyrtec.  Continue Flonase.  Encourage patient to resume Nettie pot saline rinses.  Allergic rhinitis Resume Nettie pot saline rinses Continue Zyrtec daily Can add Chlorphen Hydramine 4 mg tablets 1 to 2 tablets as needed at night for postnasal drip and allergy symptoms Continue Flonase Keep appointment with Dr. Neldon Mc  Sinusitis Stable today, improving  Resume saline/Nettie pot rinses Continue Flonase  COPD with chronic  bronchitis and emphysema ([place]) Will work to coordinate earlier appointment for PFT to be completed Keep follow-up with Dr. Lake Bells in 11/05/2017 Keep appointment with Dr. Neldon Mc  Upper respiratory infection I suspect patient has viral URI as she has known sick contacts to her grandchild   We will work to maintain symptom relief Can use Delsym over-the-counter cough syrup as needed for cough Can add Chlortab's as needed for postnasal drip use at night as this will make you sleepy Continue Zyrtec Continue Flonase  Hydrate appropriately Use Nettie pot nasal saline rinses Get adequate rest   This appointment was 28 minutes along with her 50% of the time  in direct face-to-face patient care, assessment, plan of care follow-up.   Lauraine Rinne, NP 09/25/2017

## 2017-09-25 NOTE — Patient Instructions (Addendum)
FENO today - 6  >>> Normal   Upper Resp Infection   Chlortab  o Chlor tabs 4mg  2 at bedtime  for nasal drip until cough is 100% cough free.   Can also use Delsym as needed for cough this is over-the-counter   Please contact the office if your symptoms worsen or you have concerns that you are not improving.   Thank you for choosing [facility] for your healthcare, and for allowing [place] to partner with you on your healthcare journey. I am thankful to be able to provide care to you today.   Wyn Quaker FNP-C

## 2017-09-25 NOTE — Assessment & Plan Note (Signed)
I suspect patient has viral URI as she has known sick contacts to her grandchild   We will work to maintain symptom relief Can use Delsym over-the-counter cough syrup as needed for cough Can add Chlortab's as needed for postnasal drip use at night as this will make you sleepy Continue Zyrtec Continue Flonase  Hydrate appropriately Use Nettie pot nasal saline rinses Get adequate rest

## 2017-09-25 NOTE — Assessment & Plan Note (Addendum)
Resume Nettie pot saline rinses Continue Zyrtec daily Can add Chlorphen Hydramine 4 mg tablets 1 to 2 tablets as needed at night for postnasal drip and allergy symptoms Continue Flonase Keep appointment with Dr. Neldon Mc

## 2017-09-25 NOTE — Assessment & Plan Note (Signed)
Will work to coordinate earlier appointment for PFT to be completed Keep follow-up with Dr. Lake Bells in 11/05/2017 Keep appointment with Dr. Neldon Mc

## 2017-09-25 NOTE — Assessment & Plan Note (Signed)
Stable today, improving  Resume saline/Nettie pot rinses Continue Flonase

## 2017-09-30 NOTE — Progress Notes (Signed)
Reviewed, agree 

## 2017-10-02 ENCOUNTER — Telehealth: Payer: Self-pay | Admitting: Pulmonary Disease

## 2017-10-02 NOTE — Telephone Encounter (Addendum)
ATC-Voicemail full at this time. Will need to try again later.

## 2017-10-03 NOTE — Telephone Encounter (Signed)
Attempted to contact pt. I did not receive an answer. There was no option for me to leave a message due to her voicemail being full. Will try back.  

## 2017-10-06 NOTE — Telephone Encounter (Signed)
Attempted to contact pt. I did not receive an answer. There was no option for me to leave a message due to her voicemail being full. Will try back.  

## 2017-10-07 ENCOUNTER — Ambulatory Visit (INDEPENDENT_AMBULATORY_CARE_PROVIDER_SITE_OTHER): Payer: Medicare Other | Admitting: Allergy and Immunology

## 2017-10-07 ENCOUNTER — Encounter: Payer: Self-pay | Admitting: Allergy and Immunology

## 2017-10-07 VITALS — BP 118/72 | HR 85 | Resp 16 | Ht 61.0 in | Wt 103.0 lb

## 2017-10-07 DIAGNOSIS — B999 Unspecified infectious disease: Secondary | ICD-10-CM | POA: Diagnosis not present

## 2017-10-07 DIAGNOSIS — J439 Emphysema, unspecified: Secondary | ICD-10-CM | POA: Diagnosis not present

## 2017-10-07 DIAGNOSIS — J3089 Other allergic rhinitis: Secondary | ICD-10-CM | POA: Diagnosis not present

## 2017-10-07 DIAGNOSIS — K219 Gastro-esophageal reflux disease without esophagitis: Secondary | ICD-10-CM | POA: Diagnosis not present

## 2017-10-07 MED ORDER — OMEPRAZOLE 40 MG PO CPDR: 40.0000 mg | DELAYED_RELEASE_CAPSULE | Freq: Every day | ORAL | 1 refills | Status: DC

## 2017-10-07 MED ORDER — MONTELUKAST SODIUM 10 MG PO TABS: 10.0000 mg | ORAL_TABLET | Freq: Every day | ORAL | 5 refills | Status: DC

## 2017-10-07 MED ORDER — RANITIDINE HCL 300 MG PO TABS: 300.0000 mg | ORAL_TABLET | Freq: Every day | ORAL | 5 refills | Status: DC

## 2017-10-07 MED ORDER — ALBUTEROL SULFATE HFA 108 (90 BASE) MCG/ACT IN AERS: 2.0000 | INHALATION_SPRAY | Freq: Four times a day (QID) | RESPIRATORY_TRACT | 1 refills | Status: DC | PRN

## 2017-10-07 NOTE — Progress Notes (Signed)
Dear Dr. Brigitte Pulse,  Thank you for referring Pamela Larson to the [facility] on 10/07/2017.   Below is a summation of this patient's evaluation and recommendations.  Thank you for your referral. I will keep you informed about this patient's response to treatment.   If you have any questions please do not hesitate to contact me.   Sincerely,  Jiles Prows, MD Allergy / Immunology [facility]   ______________________________________________________________________    NEW PATIENT NOTE  Referring Provider: Marton Redwood, MD Primary Provider: Marton Redwood, MD Date of office visit: 10/07/2017    Subjective:   Chief Complaint:  Pamela Larson (DOB: July 16, 1945) is a 72 y.o. female who presents to the clinic on 10/07/2017 with a chief complaint of Allergic Rhinitis  and COPD .     HPI: Pamela Larson presents to this clinic in evaluation of recurrent respiratory tract problems.  Her respiratory issue was preceded by a prolonged illness starting in 1991 which ultimately was diagnosed as Lyme's disease after receiving multiple other diagnoses including chronic fatigue syndrome, fibromyalgia, seronegative rheumatoid arthritis and others.  She was treated with doxycycline around 2010 and did much better at that point in time although she apparently had a "relapse" about 2 years ago with similar presentation for which she was once again treated with doxycycline with improvement.  Starting around 2015 she developed a "pneumonia" and ever since that point in time has had recurrent respiratory tract issues.  She describes a scenario where she developed shortness of breath and coughing and chest tightness and chest pain and back pain along with sinus fullness and sinus pain and gray to green nasal discharge and raspy voice and postnasal drip and occasional chills for which she is usually treated with an  antibiotic with improvement.  The frequency of these episodes is approximately every 6 months.  This past May she had one of these episodes and responded appropriately to antibiotics.  She was started on Advair at that point but she only utilize this medication until she felt better but subsequently visited with Dr. [place] Bells and because of an issue with continued shortness of breath was started on Anoro and also given Flonase nasal spray and she is much better as a result of that treatment.  She apparently did develop some face pain and teeth hurting in July and was treated with a Z-Pak but this cleared within days.  In between these episodes she does relatively well but has chronic postnasal drip and throat clearing and a little bit of a throat clearing like cough without any swallowing difficulties or obvious symptoms suggestive of reflux.  She does drink 1 coffee per day and has 1 or 2 drinks of alcohol every night.  She smoked tobacco for approximately 20 years which she discontinued in 1992.  Past Medical History:  Diagnosis Date  . Anemia    pernicious anemia - 1991   . Anxiety   . Arthritis    rheumatoid arthritis   . Chronic fatigue   . Cystitis, interstitial   . Depression   . Disc disorder of lumbar region 2013  . Fibromyalgia   . GERD (gastroesophageal reflux disease)   . History of kidney stones   . Hypothyroidism   . Lyme disease    hx of possible   . 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  Osteoporosis   . Pneumonia    hx of   . Rheumatoid arthritis [facility])     Past Surgical History:  Procedure Laterality Date  . APPENDECTOMY  1978  . bladder hydrodistention  1998  . BREAST SURGERY     reduction   . CERVICAL DISC ARTHROPLASTY N/A 04/03/2016   Procedure: CERVICAL SIX CERVICAL SEVEN [place] ARTHROPLASTY;  Surgeon: Jovita Gamma, MD;  Location: [place];  Service: Neurosurgery;  Laterality: N/A;  left side approach  . COSMETIC  SURGERY  2005   face lift  . EXCISION MORTON'S NEUROMA Left 05/08/2017   Procedure: EXCISION MORTON'S NEUROMA Left 3rd webspace;  Surgeon: [name], MD;  Location: [place];  Service: Orthopedics;  Laterality: Left;  . EYE SURGERY    . LUMBAR LAMINECTOMY/DECOMPRESSION MICRODISCECTOMY  06/10/2011   Procedure: LUMBAR LAMINECTOMY/DECOMPRESSION MICRODISCECTOMY 1 LEVEL;  Surgeon: Hosie Spangle, MD;  Location: [place] [facility];  Service: Neurosurgery;  Laterality: Left;  Lumbar Five Sacral One Lumbar Laminectomy Decompression Microdiscectomy     Allergies as of 10/07/2017      Reactions   Amoxicillin Nausea And Vomiting   Has patient had a PCN reaction causing immediate rash, facial/tongue/throat swelling, SOB or lightheadedness with hypotension: #  #  #  YES  #  #  #  Has patient had a PCN reaction causing severe rash involving mucus membranes or skin necrosis: No Has patient had a PCN reaction that required hospitalization No Has patient had a PCN reaction occurring within the last 10 years: #  #  #  YES  #  #  #  If all of the above answers are "NO", then may proceed with Cephalosporin use.   Augmentin [amoxicillin-pot Clavulanate] Nausea And Vomiting   Demerol [meperidine Hcl] Nausea And Vomiting   Sulfa Antibiotics Nausea And Vomiting      Medication List      acetaminophen 500 MG tablet Commonly known as:  TYLENOL Take 1,000 mg by mouth every 6 (six) hours as needed for mild pain. For pain   albuterol 108 (90 Base) MCG/ACT inhaler Commonly known as:  PROVENTIL HFA;VENTOLIN HFA Inhale 2 puffs into the lungs every 6 (six) hours as needed for wheezing or shortness of breath. For shortness of breath   ALPRAZolam 0.25 MG tablet Commonly known as:  XANAX Take 0.125 mg by mouth daily as needed for anxiety. Pt takes 1/2 tablet as needed   calcium carbonate 1250 (500 Ca) MG tablet Commonly known as:  OS-CAL - dosed in mg of elemental calcium Take 1 tablet by mouth  daily.   FLUoxetine 20 MG tablet Commonly known as:  PROZAC Take 20 mg by mouth 2 (two) times daily.   fluticasone 50 MCG/ACT nasal spray Commonly known as:  FLONASE Place 1 spray into both nostrils daily as needed for allergies or rhinitis.   guaiFENesin 600 MG 12 hr tablet Commonly known as:  MUCINEX Take 600 mg by mouth 2 (two) times daily as needed for to loosen phlegm.   levothyroxine 100 MCG tablet Commonly known as:  SYNTHROID, LEVOTHROID Take 50-100 mcg by mouth See admin instructions. TAKES 100 MCG DAILY EXCEPT ON THURSDAYS AND TAKES 50 MCG   Melatonin 3 MG Caps Take 3 mg by mouth at bedtime as needed (SLEEP).   naproxen sodium 220 MG tablet Commonly known as:  ALEVE Take 440 mg by mouth 2 (two) times daily as needed (PAIN).   omeprazole 20 MG capsule Commonly known as:  PRILOSEC Take  20 mg by mouth daily as needed. For indigestion   sodium chloride 0.65 % Soln nasal spray Commonly known as:  OCEAN Place 1 spray into both nostrils 2 (two) times daily as needed for congestion.   umeclidinium-vilanterol 62.5-25 MCG/INH Aepb Commonly known as:  ANORO ELLIPTA Inhale 1 puff into the lungs daily.   Vitamin D (Ergocalciferol) 50000 units Caps capsule Commonly known as:  DRISDOL Take 50,000 Units by mouth every 7 (seven) days. On Mondays       Review of systems negative except as noted in HPI / PMHx or noted below:  Review of Systems  Constitutional: Negative.   HENT: Negative.   Eyes: Negative.   Respiratory: Negative.   Cardiovascular: Negative.   Gastrointestinal: Negative.   Genitourinary: Negative.   Musculoskeletal: Negative.   Skin: Negative.   Neurological: Negative.   Endo/Heme/Allergies: Negative.   Psychiatric/Behavioral: Negative.     Family History  Problem Relation Age of Onset  . Cancer Mother   . Breast cancer Mother   . Colon cancer Mother   . Pancreatic cancer Mother   . Cancer Maternal Grandmother     Social History    Socioeconomic History  . Marital status: Single    Spouse name: Not on file  . Number of children: Not on file  . Years of education: Not on file  . Highest education level: Not on file  Occupational History  . Not on file  Social Needs  . Financial resource strain: Not on file  . Food insecurity:    Worry: Not on file    Inability: Not on file  . Transportation needs:    Medical: Not on file    Non-medical: Not on file  Tobacco Use  . Smoking status: Former Smoker    Packs/day: 2.00    Years: 20.00    Pack years: 40.00    Types: Cigarettes    Last attempt to quit: 02/11/1990    Years since quitting: 27.6  . Smokeless tobacco: Never Used  Substance and Sexual Activity  . Alcohol use: Yes    Alcohol/week: 14.0 standard drinks    Types: 14 Standard drinks or equivalent per week    Comment: drink per nite scotch  . Drug use: No  . Sexual activity: Never    Partners: Male  Lifestyle  . Physical activity:    Days per week: Not on file    Minutes per session: Not on file  . Stress: Not on file  Relationships  . Social connections:    Talks on phone: Not on file    Gets together: Not on file    Attends religious service: Not on file    Active member of club or organization: Not on file    Attends meetings of clubs or organizations: Not on file    Relationship status: Not on file  . Intimate partner violence:    Fear of current or ex partner: Not on file    Emotionally abused: Not on file    Physically abused: Not on file    Forced sexual activity: Not on file  Other Topics Concern  . Not on file  Social History Narrative  . Not on file    Environmental and Social history  Lives in a condominium with a dry environment, no animals located inside the household, no carpet in the bedroom, no plastic on the bed, no plastic on the pillow, and no smokers located inside the household.  Objective:   Vitals:  10/07/17 1334  BP: 118/72  Pulse: 85  Resp: 16  SpO2:  97%   Height: 5\' 1"  (154.9 cm) Weight: 103 lb (46.7 kg)  Physical Exam  Constitutional:  Throat clearing  HENT:  Head: Normocephalic. Head is without right periorbital erythema and without left periorbital erythema.  Right Ear: Tympanic membrane, external ear and ear canal normal.  Left Ear: Tympanic membrane, external ear and ear canal normal.  Nose: Nose normal. No mucosal edema or rhinorrhea.  Mouth/Throat: Oropharynx is clear and moist and mucous membranes are normal. No oropharyngeal exudate.  Eyes: Pupils are equal, round, and reactive to light. Conjunctivae and lids are normal.  Neck: Trachea normal. No tracheal deviation present. No thyromegaly present.  Cardiovascular: Normal rate, regular rhythm, S1 normal, S2 normal and normal heart sounds.  No murmur heard. Pulmonary/Chest: Effort normal. No stridor. No respiratory distress. She has no wheezes. She has no rales. She exhibits no tenderness.  Abdominal: Soft. She exhibits no distension and no mass. There is no hepatosplenomegaly. There is no tenderness. There is no rebound and no guarding.  Musculoskeletal: She exhibits no edema or tenderness.  Lymphadenopathy:       Head (right side): No tonsillar adenopathy present.       Head (left side): No tonsillar adenopathy present.    She has no cervical adenopathy.    She has no axillary adenopathy.  Neurological: She is alert.  Skin: No rash noted. She is not diaphoretic. No erythema. No pallor. Nails show no clubbing.    Diagnostics: Allergy skin tests were performed.  She did not demonstrate any hypersensitivity against a screening panel of aeroallergens or foods.  Spirometry was performed and demonstrated an FEV1 of 2.0 @ 101 % of predicted. FEV1/FVC = 0.73  Results of blood tests obtained 27 March 2016 identified WBC 6.1, hemoglobin 14.0, platelet 328  Results of a Head CT scan obtained 02 November 2013 identified the following:  The paranasal sinuses and mastoid air  cells are well pneumatized and free of fluid.  Results of a high-resolution chest CT scan obtained 27 August 2017 identified the following:  Mediastinum/Nodes: No pathologically enlarged mediastinal or hilar lymph nodes. Please note that accurate exclusion of hilar adenopathy is limited on noncontrast CT scans. Esophagus is unremarkable in appearance. No axillary lymphadenopathy.  Lungs/Pleura: High-resolution images demonstrate no significant regions of ground-glass attenuation, subpleural reticulation, parenchymal banding, traction bronchiectasis or frank honeycombing. Inspiratory and expiratory images demonstrates some mild air trapping indicative of small airways disease. Azygos lobe (normal anatomical variant) incidentally noted. No acute consolidative airspace disease. No pleural effusions. Mild diffuse bronchial wall thickening with very mild paraseptal emphysema most apparent in the lung apices.  Assessment and Plan:    1. Recurrent infections   2. Pulmonary emphysema, unspecified emphysema type ([place])   3. Perennial allergic rhinitis   4. Gastroesophageal reflux disease, esophagitis presence not specified     1.  Allergen avoidance measures?  2.  Treat and prevent inflammation:   A.  Continue Flonase 1-2 sprays each nostril 1 time per day  B.  Continue Anoro -1 inhalation 1 time per day  C.  Start montelukast 10 mg tablet 1 time per day  3.  Treat and prevent reflux:   A.  Consolidate caffeine and alcohol consumption  B.  Omeprazole 40 mg tablet in a.m.  C.  Ranitidine 300 mg tablet in p.m.  4.  If needed:   A.  OTC antihistamine  B.  Nasal saline  C.  Proventil HFA or similar 2 inhalations every 4-6 hours  5.  Blood - CBC w/diff, IgA/G/M, IgE, anti-pneumococcal ab, anti-tetanus ab  6.  Return to clinic in 6 weeks or earlier if problem  7.  Obtain fall flu vaccine.  Prevnar/Pneumovax  Pamela Larson certainly appears to have some respiratory tract inflammation and  irritation but I do not think that atopic disease is driving this issue.  She has a history very consistent with reflux induced respiratory disease and we will get her to aggressively treat reflux at this point and she does have a prior history of tobacco use which probably changed the architecture of her lung to some degree.  She does have some evidence of airway obstruction and air trapping and she will use anti-inflammatory agents for her airway as noted above.  She does have a history of recurrent renal infections and to be complete we will make sure that she has an intact B-cell immune system with the blood tests noted above.  I will see her back in this clinic in 6 weeks to assess her response to this approach and consider further evaluation and treatment based upon her response.  Jiles Prows, MD Allergy / Immunology [facility]

## 2017-10-07 NOTE — Patient Instructions (Addendum)
  1.  Allergen avoidance measures?  2.  Treat and prevent inflammation:   A.  Continue Flonase 1-2 sprays each nostril 1 time per day  B.  Continue Anoro -1 inhalation 1 time per day  C.  Start montelukast 10 mg tablet 1 time per day  3.  Treat and prevent reflux:   A.  Consolidate caffeine and alcohol consumption  B.  Omeprazole 40 mg tablet in a.m.  C.  Ranitidine 300 mg tablet in p.m.  4.  If needed:   A.  OTC antihistamine  B.  Nasal saline  C.  Proventil HFA or similar 2 inhalations every 4-6 hours  5.  Blood - CBC w/diff, IgA/G/M, IgE, anti-pneumococcal ab, anti-tetanus ab  6.  Return to clinic in 6 weeks or earlier if problem  7.  Obtain fall flu vaccine.  Prevnar/Pneumovax

## 2017-10-07 NOTE — Telephone Encounter (Signed)
We have attempted to contact the pt several times with no success or call back from the pt. Per triage protocol, message will be closed.  

## 2017-10-08 ENCOUNTER — Encounter: Payer: Self-pay | Admitting: Allergy and Immunology

## 2017-10-10 LAB — FUNGUS CULTURE W SMEAR
MICRO NUMBER:: 90828479
SMEAR: NONE SEEN
SPECIMEN QUALITY: ADEQUATE

## 2017-10-10 LAB — CBC WITH DIFFERENTIAL/PLATELET
BASOS ABS: 0 10*3/uL (ref 0.0–0.2)
Basos: 0 %
EOS (ABSOLUTE): 0 10*3/uL (ref 0.0–0.4)
EOS: 1 %
HEMATOCRIT: 43.1 % (ref 34.0–46.6)
HEMOGLOBIN: 14.8 g/dL (ref 11.1–15.9)
Immature Grans (Abs): 0 10*3/uL (ref 0.0–0.1)
Immature Granulocytes: 0 %
LYMPHS ABS: 1.1 10*3/uL (ref 0.7–3.1)
Lymphs: 14 %
MCH: 34.9 pg — AB (ref 26.6–33.0)
MCHC: 34.3 g/dL (ref 31.5–35.7)
MCV: 102 fL — ABNORMAL HIGH (ref 79–97)
MONOCYTES: 9 %
MONOS ABS: 0.7 10*3/uL (ref 0.1–0.9)
NEUTROS ABS: 6 10*3/uL (ref 1.4–7.0)
Neutrophils: 76 %
Platelets: 326 10*3/uL (ref 150–450)
RBC: 4.24 x10E6/uL (ref 3.77–5.28)
RDW: 13.5 % (ref 12.3–15.4)
WBC: 7.9 10*3/uL (ref 3.4–10.8)

## 2017-10-10 LAB — IGG, IGA, IGM
IgA/Immunoglobulin A, Serum: 85 mg/dL — ABNORMAL LOW (ref 87–352)
IgG (Immunoglobin G), Serum: 945 mg/dL (ref 700–1600)
IgM (Immunoglobulin M), Srm: 48 mg/dL (ref 26–217)

## 2017-10-10 LAB — STREP PNEUMONIAE 23 SEROTYPES IGG
PNEUMO AB TYPE 14: 0.9 ug/mL — AB (ref >1.3)
PNEUMO AB TYPE 20: 0.8 ug/mL — AB (ref >1.3)
PNEUMO AB TYPE 22 (22F): 0.1 ug/mL — AB (ref >1.3)
PNEUMO AB TYPE 2: 0.7 ug/mL — AB (ref >1.3)
PNEUMO AB TYPE 51 (7F): 0.4 ug/mL — AB (ref >1.3)
PNEUMO AB TYPE 54 (15B): 0.1 ug/mL — AB (ref >1.3)
PNEUMO AB TYPE 57 (19A): 3.3 ug/mL (ref >1.3)
PNEUMO AB TYPE 68 (9V): 0.2 ug/mL — AB (ref >1.3)
PNEUMO AB TYPE 8: 0.3 ug/mL — AB (ref >1.3)
Pneumo Ab Type 1*: 1.9 ug/mL (ref >1.3)
Pneumo Ab Type 12 (12F)*: <0.1 ug/mL — ABNORMAL LOW (ref >1.3)
Pneumo Ab Type 17 (17F)*: 1 ug/mL — ABNORMAL LOW (ref >1.3)
Pneumo Ab Type 19 (19F)*: 2.7 ug/mL (ref >1.3)
Pneumo Ab Type 23 (23F)*: 0.2 ug/mL — ABNORMAL LOW (ref >1.3)
Pneumo Ab Type 26 (6B)*: <0.1 ug/mL — ABNORMAL LOW (ref >1.3)
Pneumo Ab Type 3*: 0.7 ug/mL — ABNORMAL LOW (ref >1.3)
Pneumo Ab Type 34 (10A)*: 1.7 ug/mL (ref >1.3)
Pneumo Ab Type 4*: <0.1 ug/mL — ABNORMAL LOW (ref >1.3)
Pneumo Ab Type 43 (11A)*: 1.4 ug/mL (ref >1.3)
Pneumo Ab Type 5*: 5.4 ug/mL (ref >1.3)
Pneumo Ab Type 56 (18C)*: 2.3 ug/mL (ref >1.3)
Pneumo Ab Type 70 (33F)*: 3.2 ug/mL (ref >1.3)
Pneumo Ab Type 9 (9N)*: 0.3 ug/mL — ABNORMAL LOW (ref >1.3)

## 2017-10-10 LAB — MYCOBACTERIA,CULT W/FLUOROCHROME SMEAR
MICRO NUMBER:: 90828480
SMEAR:: NONE SEEN
SPECIMEN QUALITY:: ADEQUATE

## 2017-10-10 LAB — DIPHTHERIA / TETANUS ANTIBODY PANEL: DIPHTHERIA AB: 0.74 [IU]/mL (ref <0.10)

## 2017-10-10 LAB — RESPIRATORY CULTURE OR RESPIRATORY AND SPUTUM CULTURE
MICRO NUMBER: 90828481
RESULT:: NORMAL
SPECIMEN QUALITY:: ADEQUATE

## 2017-10-10 LAB — IGE: IgE (Immunoglobulin E), Serum: 7 IU/mL (ref 6–495)

## 2017-10-15 ENCOUNTER — Ambulatory Visit: Payer: Medicare Other | Admitting: Acute Care

## 2017-10-16 ENCOUNTER — Encounter: Payer: Self-pay | Admitting: Pulmonary Disease

## 2017-10-16 ENCOUNTER — Ambulatory Visit (INDEPENDENT_AMBULATORY_CARE_PROVIDER_SITE_OTHER): Payer: Medicare Other | Admitting: Pulmonary Disease

## 2017-10-16 VITALS — BP 152/88 | HR 77 | Temp 98.0°F | Ht 60.0 in | Wt 104.6 lb

## 2017-10-16 DIAGNOSIS — R0602 Shortness of breath: Secondary | ICD-10-CM

## 2017-10-16 DIAGNOSIS — J309 Allergic rhinitis, unspecified: Secondary | ICD-10-CM

## 2017-10-16 DIAGNOSIS — Z0000 Encounter for general adult medical examination without abnormal findings: Secondary | ICD-10-CM

## 2017-10-16 DIAGNOSIS — J0110 Acute frontal sinusitis, unspecified: Secondary | ICD-10-CM | POA: Diagnosis not present

## 2017-10-16 LAB — PULMONARY FUNCTION TEST
DL/VA % pred: 91 %
DL/VA: 3.86 ml/min/mmHg/L
DLCO cor % pred: 81 %
DLCO cor: 15.3 ml/min/mmHg
DLCO unc % pred: 84 %
DLCO unc: 15.92 ml/min/mmHg
FEF 25-75 Post: 2.14 L/sec
FEF 25-75 Pre: 2.09 L/sec
FEF2575-%Change-Post: 2 %
FEF2575-%Pred-Post: 128 %
FEF2575-%Pred-Pre: 126 %
FEV1-%CHANGE-POST: 0 %
FEV1-%PRED-POST: 110 %
FEV1-%Pred-Pre: 110 %
FEV1-PRE: 2.08 L
FEV1-Post: 2.1 L
FEV1FVC-%Change-Post: 2 %
FEV1FVC-%Pred-Pre: 105 %
FEV6-%Change-Post: -1 %
FEV6-%PRED-PRE: 108 %
FEV6-%Pred-Post: 106 %
FEV6-POST: 2.55 L
FEV6-PRE: 2.6 L
FEV6FVC-%Pred-Post: 105 %
FEV6FVC-%Pred-Pre: 105 %
FVC-%CHANGE-POST: -1 %
FVC-%PRED-POST: 101 %
FVC-%PRED-PRE: 103 %
FVC-POST: 2.55 L
FVC-PRE: 2.6 L
POST FEV6/FVC RATIO: 100 %
PRE FEV6/FVC RATIO: 100 %
Post FEV1/FVC ratio: 82 %
Pre FEV1/FVC ratio: 80 %
RV % PRED: 85 %
RV: 1.7 L
TLC % PRED: 99 %
TLC: 4.43 L

## 2017-10-16 MED ORDER — AZITHROMYCIN 250 MG PO TABS: ORAL_TABLET | ORAL | 0 refills | Status: DC

## 2017-10-16 MED ORDER — UMECLIDINIUM-VILANTEROL 62.5-25 MCG/INH IN AEPB: 1.0000 | INHALATION_SPRAY | Freq: Every day | RESPIRATORY_TRACT | 0 refills | Status: DC

## 2017-10-16 NOTE — Progress Notes (Signed)
@Patient  ID: Pamela Larson, female    DOB: 1945/11/19, 72 y.o.   MRN: [id number]  Chief Complaint  Patient presents with  . Acute Visit    Sinus congestion and pain for 2 days    Referring provider: Marton Redwood, MD  HPI:  72 year old female former smoker followed in our office for recurrent bronchitis.  Patient is also followed by Dr. Neldon Mc for allergy.  PMH: Anxiety, GERD, chronic fatigue syndrome, fibromyalgia, Lyme disease Smoker/ Smoking History: Former smoker.  40 pack years. Maintenance: Anoro Ellipta Pt of: Dr. Lake Bells  Recent [facility] Encounters:   09/25/17-acute office visit-BM 72 year old patient presents today after being seen in our office on 09/24/2017.  Patient with increased shortness of breath and chest tightness over the last 24 hours.  Patient reports known sick contacts to grandson.  Patient has upcoming allergy appointment on 10/05/2017 with Dr. Neldon Mc. Plan: FeNO today is 7, keep follow-up with Dr. Neldon Mc, continue Zyrtec, can add ChlorphenHydramine, continue Flonase, resume saline Nettie pot rinses  10/16/2017  - Visit   72 year old patient presenting today for acute symptoms.  Patient feels that over the past 2 days she has had worsening left-sided sinus tenderness and pain.  Patient completed pulmonary function test today in office and reported the symptoms which prompted the acute visit today.  Patient reports she completed initial office visit with Dr. Neldon Mc with allergy.  Patient was diagnosed with acid reflux.  Patient reports she is been adherent to her Anoro Ellipta and Flonase was Dr. Neldon Mc prescribed. Patient endorses that she will follow-up with him at 6-week office visit.   Tests:   08/20/2017-spirometry-normal spirometry 08/22/2017- sputum culture, AFB, fungal all negative 08/27/2017-CT chest high-res- no ILD, mild diffuse bronchial wall thickening with very mild paraseptal emphysema  10/16/17-pulmonary function test-ratio 82, no  bronchodilator response, DLCO 84   Chart Review:     Specialty Problems      Pulmonary Problems   Allergic rhinitis   COPD with chronic bronchitis and emphysema ([place])    09/25/17 -FeNO-7      Sinusitis   Upper respiratory infection      Allergies  Allergen Reactions  . Amoxicillin Nausea And Vomiting     Has patient had a PCN reaction causing immediate rash, facial/tongue/throat swelling, SOB or lightheadedness with hypotension: #  #  #  YES  #  #  #  Has patient had a PCN reaction causing severe rash involving mucus membranes or skin necrosis: No Has patient had a PCN reaction that required hospitalization No Has patient had a PCN reaction occurring within the last 10 years: #  #  #  YES  #  #  #  If all of the above answers are "NO", then may proceed with Cephalosporin use.  . Augmentin [Amoxicillin-Pot Clavulanate] Nausea And Vomiting  . Demerol [Meperidine Hcl] Nausea And Vomiting  . Sulfa Antibiotics Nausea And Vomiting    Immunization History  Administered Date(s) Administered  . Influenza, High Dose Seasonal PF 12/07/2016  . Tdap 11/01/2016  . Zoster Recombinat (Shingrix) 12/26/2016, 06/12/2017   Consider Prevnar 13 and flu vaccine at next office visit  Past Medical History:  Diagnosis Date  . Anemia    pernicious anemia - 1991   . Anxiety   . Arthritis    rheumatoid arthritis   . Chronic fatigue   . Cystitis, interstitial   . Depression   . Disc disorder of lumbar region 2013  . Fibromyalgia   . GERD (gastroesophageal  reflux disease)   . History of kidney stones   . Hypothyroidism   . Lyme disease    hx of possible   . 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 Osteoporosis   . Pneumonia    hx of   . Rheumatoid arthritis ([place])     Tobacco History: Social History   Tobacco Use  Smoking Status Former Smoker  . Packs/day: 2.00  . Years: 20.00  . Pack years: 40.00  . Types: Cigarettes  . Last  attempt to quit: 02/11/1990  . Years since quitting: 27.6  Smokeless Tobacco Never Used   Counseling given: Not Answered Continue not smoking  Outpatient Encounter Medications as of 10/16/2017  Medication Sig  . acetaminophen (TYLENOL) 500 MG tablet Take 1,000 mg by mouth every 6 (six) hours as needed for mild pain. For pain  . ALPRAZolam (XANAX) 0.25 MG tablet Take 0.125 mg by mouth daily as needed for anxiety. Pt takes 1/2 tablet as needed   . calcium carbonate (OS-CAL - DOSED IN MG OF ELEMENTAL CALCIUM) 1250 MG tablet Take 1 tablet by mouth daily.  Marland Kitchen FLUoxetine (PROZAC) 20 MG tablet Take 20 mg by mouth 2 (two) times daily.  . fluticasone (FLONASE) 50 MCG/ACT nasal spray Place 1 spray into both nostrils daily as needed for allergies or rhinitis.  Marland Kitchen guaiFENesin (MUCINEX) 600 MG 12 hr tablet Take 600 mg by mouth 2 (two) times daily as needed for to loosen phlegm.  Marland Kitchen levothyroxine (SYNTHROID, LEVOTHROID) 100 MCG tablet Take 50-100 mcg by mouth See admin instructions. TAKES 100 MCG DAILY EXCEPT ON THURSDAYS AND TAKES 50 MCG  . Melatonin 3 MG CAPS Take 3 mg by mouth at bedtime as needed (SLEEP).  . montelukast (SINGULAIR) 10 MG tablet Take 1 tablet (10 mg total) by mouth at bedtime.  . naproxen sodium (ANAPROX) 220 MG tablet Take 440 mg by mouth 2 (two) times daily as needed (PAIN).  Marland Kitchen omeprazole (PRILOSEC) 40 MG capsule Take 1 capsule (40 mg total) by mouth daily.  . ranitidine (ZANTAC) 300 MG tablet Take 1 tablet (300 mg total) by mouth at bedtime.  . sodium chloride (OCEAN) 0.65 % SOLN nasal spray Place 1 spray into both nostrils 2 (two) times daily as needed for congestion.  Marland Kitchen umeclidinium-vilanterol (ANORO ELLIPTA) 62.5-25 MCG/INH AEPB Inhale 1 puff into the lungs daily.  . Vitamin D, Ergocalciferol, (DRISDOL) 50000 UNITS CAPS Take 50,000 Units by mouth every 7 (seven) days. On Mondays  . albuterol (PROVENTIL HFA;VENTOLIN HFA) 108 (90 Base) MCG/ACT inhaler Inhale 2 puffs into the lungs every 6  (six) hours as needed for wheezing or shortness of breath. For shortness of breath (Patient not taking: Reported on 10/16/2017)  . azithromycin (ZITHROMAX) 250 MG tablet 500mg  (two tablets) today, then 250mg  (1 tablet) for the next 4 days  . umeclidinium-vilanterol (ANORO ELLIPTA) 62.5-25 MCG/INH AEPB Inhale 1 puff into the lungs daily.   No facility-administered encounter medications on file as of 10/16/2017.      Review of Systems  Review of Systems  Constitutional: Positive for fatigue. Negative for chills, fever and unexpected weight change.  HENT: Positive for congestion, sinus pressure and sinus pain. Negative for ear pain and postnasal drip.   Respiratory: Negative for cough, chest tightness, shortness of breath and wheezing.   Cardiovascular: Negative for chest pain and palpitations.  Gastrointestinal: Negative for blood in stool, diarrhea, nausea and vomiting.  Genitourinary: Negative for dysuria,  frequency and urgency.  Musculoskeletal: Negative for arthralgias.  Skin: Negative for color change.  Allergic/Immunologic: Positive for environmental allergies. Negative for food allergies.  Neurological: Negative for dizziness, light-headedness and headaches.  Psychiatric/Behavioral: Negative for dysphoric mood. The patient is not nervous/anxious.   All other systems reviewed and are negative.    Physical Exam  BP (!) 152/88 (BP Location: Left Arm, Cuff Size: Normal) Comment: pt states she has white coat syndrome  Pulse 77   Temp 98 F (36.7 C) (Oral)   Ht 5' (1.524 m)   Wt 104 lb 9.6 oz (47.4 kg)   SpO2 99%   BMI 20.43 kg/m   Wt Readings from Last 5 Encounters:  10/16/17 104 lb 9.6 oz (47.4 kg)  10/07/17 103 lb (46.7 kg)  09/25/17 103 lb 9.6 oz (47 kg)  09/24/17 103 lb 3.2 oz (46.8 kg)  09/10/17 104 lb 3.2 oz (47.3 kg)     Physical Exam  Constitutional: She is oriented to person, place, and time and well-developed, well-nourished, and in no distress. No distress.    HENT:  Head: Normocephalic and atraumatic.  Right Ear: Hearing, tympanic membrane, external ear and ear canal normal.  Left Ear: Hearing, tympanic membrane, external ear and ear canal normal.  Nose: Rhinorrhea present. Right sinus exhibits no maxillary sinus tenderness and no frontal sinus tenderness. Left sinus exhibits maxillary sinus tenderness and frontal sinus tenderness.  Mouth/Throat: Uvula is midline and oropharynx is clear and moist. No oropharyngeal exudate.  Eyes: Pupils are equal, round, and reactive to light.  Neck: Normal range of motion. Neck supple. No JVD present.  Cardiovascular: Normal rate, regular rhythm and normal heart sounds.  Pulmonary/Chest: Effort normal and breath sounds normal. No accessory muscle usage. No respiratory distress. She has no decreased breath sounds. She has no wheezes. She has no rhonchi.  Musculoskeletal: Normal range of motion. She exhibits no edema.  Lymphadenopathy:    She has no cervical adenopathy.  Neurological: She is alert and oriented to person, place, and time. Gait normal.  Skin: Skin is warm and dry. She is not diaphoretic. No erythema.  Psychiatric: Mood, memory, affect and judgment normal.  Nursing note and vitals reviewed.     Lab Results:  CBC    Component Value Date/Time   WBC 7.9 10/07/2017 1442   WBC 6.1 03/27/2016 1146   RBC 4.24 10/07/2017 1442   RBC 4.08 03/27/2016 1146   HGB 14.8 10/07/2017 1442   HCT 43.1 10/07/2017 1442   PLT 326 10/07/2017 1442   MCV 102 (H) 10/07/2017 1442   MCH 34.9 (H) 10/07/2017 1442   MCH 34.3 (H) 03/27/2016 1146   MCHC 34.3 10/07/2017 1442   MCHC 33.2 03/27/2016 1146   RDW 13.5 10/07/2017 1442   LYMPHSABS 1.1 10/07/2017 1442   MONOABS 0.5 11/02/2013 0432   EOSABS 0.0 10/07/2017 1442   BASOSABS 0.0 10/07/2017 1442    BMET    Component Value Date/Time   NA 139 03/27/2016 1146   K 4.3 03/27/2016 1146   CL 101 03/27/2016 1146   CO2 25 03/27/2016 1146   GLUCOSE 86 03/27/2016  1146   BUN 10 03/27/2016 1146   CREATININE 0.65 03/27/2016 1146   CALCIUM 9.8 03/27/2016 1146   GFRNONAA >60 03/27/2016 1146   GFRAA >60 03/27/2016 1146    BNP No results found for: BNP  ProBNP No results found for: PROBNP  Imaging: No results found.    Assessment & Plan:   Pleasant 72 year old patient seen office  visit today.  Patient with potential new onset left maxillary and frontal sinusitis.  Patient is quite anxious and does not want to be without treatment with poor weather approaching.  Discussed with patient that I am comfortable sending a prescription for the antibiotic but she needs to wait 3 to 5 days to see how symptoms progress.  As last time patient was seen here for respiratory infection and required no antibiotics to clear.  Patient agrees that she will wait 3 to 5 days to see if symptoms are improving over that time or worsening.  If they are worsening then patient can pick up antibiotic from pharmacy and start treatment.  Informed patient that she does need to start treatment with antibiotics and contact our office and let us know.  Patient would like to keep follow-up appointments with both Sarah gross next week as well as Dr. Lake Bells later on this month.  Patient to keep follow-up with Dr. Neldon Mc.  Consider Prevnar 13 and flu vaccine at next office visit if she is stable.  Allergic rhinitis Continue Flonase Continue follow-up with Kozlow  Sinusitis Azthiromycin 250mg  tablet  >>>Take 2 tablets (500mg  total) today, and then 1 tablet (250mg ) for the next four days  >>>take with food  >>>can also take probiotic and / or yogurt while on antibiotic  >>> Hold off starting to see how symptoms go over the next 3 to 5 days, if symptoms worsen can pick up and start taking  Keep follow-up with Judson Roch 94/11/19  Keep follow-up with Dr. Lake Bells    Healthcare maintenance Consider Prevnar 13 and flu vaccine at next office visit     Lauraine Rinne, NP 10/16/2017

## 2017-10-16 NOTE — Patient Instructions (Signed)
Azthiromycin 250mg  tablet  >>>Take 2 tablets (500mg  total) today, and then 1 tablet (250mg ) for the next four days  >>>take with food  >>>can also take probiotic and / or yogurt while on antibiotic  >>> Hold off starting to see how symptoms go over the next 3 to 5 days, if symptoms worsen can pick up and start taking  Keep follow-up with Sarah 94/11/19  Keep follow-up with Dr. Lake Bells  Keep follow-up with Dr. Neldon Mc  Continue Anoro Ellipta  Continue omeprazole 40 mg daily Continue Zantac 300 mg at night  Consider flu vaccine at next office visit if feeling better  It is flu season:   >>>Remember to be washing your hands regularly, using hand sanitizer, be careful to use around herself with has contact with people who are sick will increase her chances of getting sick yourself. >>> Best ways to protect herself from the flu: Receive the yearly flu vaccine, practice good hand hygiene washing with soap and also using hand sanitizer when available, eat a nutritious meals, get adequate rest, hydrate appropriately   Please contact the office if your symptoms worsen or you have concerns that you are not improving.   Thank you for choosing [facility] for your healthcare, and for allowing [place] to partner with you on your healthcare journey. I am thankful to be able to provide care to you today.   Wyn Quaker FNP-C    Gastroesophageal Reflux Disease, Adult Normally, food travels down the esophagus and stays in the stomach to be digested. If a person has gastroesophageal reflux disease (GERD), food and stomach acid move back up into the esophagus. When this happens, the esophagus becomes sore and swollen (inflamed). Over time, GERD can make small holes (ulcers) in the lining of the esophagus. Follow these instructions at home: Diet  Follow a diet as told by your doctor. You may need to avoid foods and drinks such as: ? Coffee and tea (with or without caffeine). ? Drinks that  contain alcohol. ? Energy drinks and sports drinks. ? Carbonated drinks or sodas. ? Chocolate and cocoa. ? Peppermint and mint flavorings. ? Garlic and onions. ? Horseradish. ? Spicy and acidic foods, such as peppers, chili powder, curry powder, vinegar, hot sauces, and BBQ sauce. ? Citrus fruit juices and citrus fruits, such as oranges, lemons, and limes. ? Tomato-based foods, such as red sauce, chili, salsa, and pizza with red sauce. ? Fried and fatty foods, such as donuts, french fries, potato chips, and high-fat dressings. ? High-fat meats, such as hot dogs, rib eye steak, sausage, ham, and bacon. ? High-fat dairy items, such as whole milk, butter, and cream cheese.  Eat small meals often. Avoid eating large meals.  Avoid drinking large amounts of liquid with your meals.  Avoid eating meals during the 2-3 hours before bedtime.  Avoid lying down right after you eat.  Do not exercise right after you eat. General instructions  Pay attention to any changes in your symptoms.  Take over-the-counter and prescription medicines only as told by your doctor. Do not take aspirin, ibuprofen, or other NSAIDs unless your doctor says it is okay.  Do not use any tobacco products, including cigarettes, chewing tobacco, and e-cigarettes. If you need help quitting, ask your doctor.  Wear loose clothes. Do not wear anything tight around your waist.  Raise (elevate) the head of your bed about 6 inches (15 cm).  Try to lower your stress. If you need help doing this, ask your  doctor.  If you are overweight, lose an amount of weight that is healthy for you. Ask your doctor about a safe weight loss goal.  Keep all follow-up visits as told by your doctor. This is important. Contact a doctor if:  You have new symptoms.  You lose weight and you do not know why it is happening.  You have trouble swallowing, or it hurts to swallow.  You have wheezing or a cough that keeps happening.  Your  symptoms do not get better with treatment.  You have a hoarse voice. Get help right away if:  You have pain in your arms, neck, jaw, teeth, or back.  You feel sweaty, dizzy, or light-headed.  You have chest pain or shortness of breath.  You throw up (vomit) and your throw up looks like blood or coffee grounds.  You pass out (faint).  Your poop (stool) is bloody or black.  You cannot swallow, drink, or eat. This information is not intended to replace advice given to you by your health care provider. Make sure you discuss any questions you have with your health care provider. Document Released: 07/17/2007 Document Revised: 07/06/2015 Document Reviewed: 05/25/2014 Elsevier Interactive Patient Education  2018 [place] for Gastroesophageal Reflux Disease, Adult When you have gastroesophageal reflux disease (GERD), the foods you eat and your eating habits are very important. Choosing the right foods can help ease your discomfort. What guidelines do I need to follow?  Choose fruits, vegetables, whole grains, and low-fat dairy products.  Choose low-fat meat, fish, and poultry.  Limit fats such as oils, salad dressings, butter, nuts, and avocado.  Keep a food diary. This helps you identify foods that cause symptoms.  Avoid foods that cause symptoms. These may be different for everyone.  Eat small meals often instead of 3 large meals a day.  Eat your meals slowly, in a place where you are relaxed.  Limit fried foods.  Cook foods using methods other than frying.  Avoid drinking alcohol.  Avoid drinking large amounts of liquids with your meals.  Avoid bending over or lying down until 2-3 hours after eating. What foods are not recommended? These are some foods and drinks that may make your symptoms worse: Vegetables Tomatoes. Tomato juice. Tomato and spaghetti sauce. Chili peppers. Onion and garlic. Horseradish. Fruits Oranges, grapefruit, and lemon  (fruit and juice). Meats High-fat meats, fish, and poultry. This includes hot dogs, ribs, ham, sausage, salami, and bacon. Dairy Whole milk and chocolate milk. Sour cream. Cream. Butter. Ice cream. Cream cheese. Drinks Coffee and tea. Bubbly (carbonated) drinks or energy drinks. Condiments Hot sauce. Barbecue sauce. Sweets/Desserts Chocolate and cocoa. Donuts. Peppermint and spearmint. Fats and Oils High-fat foods. This includes [place] fries and potato chips. Other Vinegar. Strong spices. This includes black pepper, white pepper, red pepper, cayenne, curry powder, cloves, ginger, and chili powder. The items listed above may not be a complete list of foods and drinks to avoid. Contact your dietitian for more information. This information is not intended to replace advice given to you by your health care provider. Make sure you discuss any questions you have with your health care provider. Document Released: 07/30/2011 Document Revised: 07/06/2015 Document Reviewed: 12/02/2012 Elsevier Interactive Patient Education  2017 [employer].

## 2017-10-16 NOTE — Assessment & Plan Note (Signed)
Continue Flonase Continue follow-up with Kozlow

## 2017-10-16 NOTE — Assessment & Plan Note (Signed)
Azthiromycin 250mg  tablet  >>>Take 2 tablets (500mg  total) today, and then 1 tablet (250mg ) for the next four days  >>>take with food  >>>can also take probiotic and / or yogurt while on antibiotic  >>> Hold off starting to see how symptoms go over the next 3 to 5 days, if symptoms worsen can pick up and start taking  Keep follow-up with Judson Roch 94/11/19  Keep follow-up with Dr. Lake Bells

## 2017-10-16 NOTE — Progress Notes (Signed)
Patient completed full PFT today. 

## 2017-10-16 NOTE — Assessment & Plan Note (Signed)
Consider Prevnar 13 and flu vaccine at next office visit

## 2017-10-20 NOTE — Progress Notes (Signed)
Reviewed, agree 

## 2017-10-22 ENCOUNTER — Encounter: Payer: Self-pay | Admitting: Acute Care

## 2017-10-22 ENCOUNTER — Ambulatory Visit (INDEPENDENT_AMBULATORY_CARE_PROVIDER_SITE_OTHER): Payer: Medicare Other | Admitting: Acute Care

## 2017-10-22 VITALS — BP 156/90 | HR 83 | Ht 60.0 in | Wt 106.0 lb

## 2017-10-22 DIAGNOSIS — Z23 Encounter for immunization: Secondary | ICD-10-CM | POA: Diagnosis not present

## 2017-10-22 DIAGNOSIS — J449 Chronic obstructive pulmonary disease, unspecified: Secondary | ICD-10-CM

## 2017-10-22 DIAGNOSIS — J0110 Acute frontal sinusitis, unspecified: Secondary | ICD-10-CM | POA: Diagnosis not present

## 2017-10-22 DIAGNOSIS — J309 Allergic rhinitis, unspecified: Secondary | ICD-10-CM

## 2017-10-22 DIAGNOSIS — Z0000 Encounter for general adult medical examination without abnormal findings: Secondary | ICD-10-CM

## 2017-10-22 DIAGNOSIS — K219 Gastro-esophageal reflux disease without esophagitis: Secondary | ICD-10-CM | POA: Insufficient documentation

## 2017-10-22 MED ORDER — PNEUMOCOCCAL VAC POLYVALENT 25 MCG/0.5ML IJ INJ: 0.5000 mL | INJECTION | INTRAMUSCULAR | Status: DC

## 2017-10-22 NOTE — Assessment & Plan Note (Signed)
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

## 2017-10-22 NOTE — Patient Instructions (Addendum)
It is good to see you today Pneumovax and then repeat pneumo 24 testing 6 weeks after immunization Anoro 1 puff once daily Rinse mouth after use Flonase 2 squirts each nostril once daily Continue Singulair 10 mg daily Continue omeprazole 40 mg once daily Continue zantac 300 mg  once daily Follow up with Dr. Neldon Mc 11/11/2017 Follow up with Judson Roch NP or NP 11/20/2017 after allergy follow up with Dr. Neldon Mc Note your daily symptoms > remember "red flags" for respiratory illness:  Increase in cough, increase in sputum production, increase in shortness of breath or activity intolerance. If you notice these symptoms, please call to be seen.  Please contact office for sooner follow up if symptoms do not improve or worsen or seek emergency care  .

## 2017-10-22 NOTE — Assessment & Plan Note (Signed)
Resolved without need for Z pack Clear secretions No frontal or sinus tenderness Plan: Monitor and close follow up at any sign of recurrance

## 2017-10-22 NOTE — Assessment & Plan Note (Signed)
PFT's essentially normal Will continue Anoro as patient feels she is getting benefit and if finally " better" with her current regimen. Consider therapeutic trial off Anoro in near future

## 2017-10-22 NOTE — Assessment & Plan Note (Signed)
Did not want to get pneumovax and flu vaccine on the same day Plan: Will need Flu vaccine at follow up

## 2017-10-22 NOTE — Progress Notes (Signed)
History of Present Illness Pamela Larson is a 72 y.o. female former smoker ( quit 1992 with a 40 pack year smoking history) with recurrent infections, and  bronchitis. She is followed by Dr. Lake Bells  Synopsis: Referred inJuly 2054for recurrent bronchitis episodes ever since August 2015.;smoked 2 packs of cigarettes daily for 20 years, quit in [place] Pulmonary Encounters:   08/20/2017- initial office visit-[place]  patient reporting back in 2015 she had a serious lung infection.  Since then has been to the doctor about every 6 months where she gets sick.  Patient reports she had Lyme disease diagnosed back in 1991.  She says she had bronchitis-like kids do when she was a child typically about every other year.  Patient with a 40-pack-year smoking history.  Chest x-ray from August 2015 showed possible emphysema and airway thickening in right lower lobe. Plan: High-res CT, pulmonary function test, sputum cultures, follow-up in 2 weeks  09/03/2017- office visit-SG Patient reviewed high-res CT, spirometry as well as sputum cultures today.  Patient with nasal congestion.  CT high-res was negative for bronchiectasis but did show some emphysema. Plan: Referral to allergy (Dr. Neldon Mc), therapeutic trial of Anoro Ellipta, scheduled for PFTs, follow-up in 2 months with Dr. Lake Bells, continue Zyrtec  09/10/17- OV - SG  Early sinusitis, treated with Z-Pak, continue Anoro  09/24/2017-office visit-BW Patient doing better  09/25/2017>>Acute OV: BM- suspected viral URI as + sick contact with grandchild Allergic rhinitis>> adding Nettie Pott rinses and Chlorphenhydramine for allergy symptoms to her regimen Sinusitis was improving Keep appointment with Dr. Neldon Mc Keep PFT appointment  10/07/2017 OV Dr. Neldon Mc Pt. appears to have some respiratory tract inflammation and irritation but doubt that atopic disease is driving this issue.   History very consistent with reflux induced  respiratory disease and we will get her to aggressively treat reflux at this point and she does have a prior history of tobacco use which probably changed the architecture of her lung to some degree.  She does have some evidence of airway obstruction and air trapping and she will continue use anti-inflammatory agents as she has been doing.  Positive  history of recurrent renal infections and to be complete we will make sure that she has an intact B-cell immune system with the blood tests noted above.  She has follow up 11/11/2017 to evaluate her response to treatment. >> Dr. Neldon Mc   10/16/2017>> Acute OV - BM- for acute left frontal maxillary sinusitis Asked to wait 3 days to see if symptoms clear. Prescribed Azithro in the event this does not clear in 3-5 days.     10/22/2017  Follow up Frontal Sinus infection: Pt. Presents for follow up. She states she did not have to use the z pack prescribed by Wyn Quaker NP. She states her sinus infection has resolved. She was seen by Dr. Neldon Mc. Allergy testing was not significant for anything. She has been started on omeprazole and zantac for suspected reflux. She has been started on Singulair. Her strep pneumonia antibodies are low and she needs prevnar today.She states she is doing well.She states no sinus issues. She states she does occasionally cough up a small amounts of secretions but they are not purulent. She denies any fever, chest pain, orthopnea or hemoptysis.Her pneumococcal   Test Results:  08/20/2017-spirometry-normal spirometry 08/22/2017- sputum culture, AFB, fungal all negative 08/27/2017-CT chest high-res- no ILD, mild diffuse bronchial wall thickening with very mild paraseptal emphysema Lab Results: 10/07/2017 IgE>> 71 IU/ml IgA>> 85  mg/dl ( Low) IgM>> 48 mg/dl IgG>> 945 mg/dl Anti pneumococcal ab>> Pneumo Ab Type 1* >1.3 ug/mL 1.9   Pneumo Ab Type 3* >1.3 ug/mL 0.7Low    Pneumo Ab Type 4* >1.3 ug/mL <0.1Low    Pneumo Ab Type 8* >1.3  ug/mL 0.3Low    Pneumo Ab Type 9 (9N)* >1.3 ug/mL 0.3Low    Pneumo Ab Type 12 (68F)* >1.3 ug/mL <0.1Low    Pneumo Ab Type 14* >1.3 ug/mL 0.9Low    Pneumo Ab Type 17 (75F)* >1.3 ug/mL 1.0Low    Pneumo Ab Type 19 (4F)* >1.3 ug/mL 2.7   Pneumo Ab Type 2* >1.3 ug/mL 0.7Low    Pneumo Ab Type 20* >1.3 ug/mL 0.8Low    Pneumo Ab Type 22 (66F)* >1.3 ug/mL 0.1Low    Pneumo Ab Type 23 (3107F)* >1.3 ug/mL 0.2Low    Pneumo Ab Type 26 (6B)* >1.3 ug/mL <0.1Low    Pneumo Ab Type 34 (10A)* >1.3 ug/mL 1.7   Pneumo Ab Type 43 (11A)* >1.3 ug/mL 1.4   Pneumo Ab Type 5* >1.3 ug/mL 5.4   Pneumo Ab Type 51 (107F)* >1.3 ug/mL 0.4Low    Pneumo Ab Type 54 (15B)* >1.3 ug/mL 0.1Low    Pneumo Ab Type 56 (18C)* >1.3 ug/mL 2.3   Pneumo Ab Type 57 (19A)* >1.3 ug/mL 3.3   Pneumo Ab Type 68 (9V)* >1.3 ug/mL 0.2Low    Pneumo Ab Type 70 (66F)* >1.3 ug      CBC Latest Ref Rng & Units 10/07/2017 03/27/2016 11/06/2014  WBC 3.4 - 10.8 x10E3/uL 7.9 6.1 8.5  Hemoglobin 11.1 - 15.9 g/dL 14.8 14.0 13.7  Hematocrit 34.0 - 46.6 % 43.1 42.2 41.1  Platelets 150 - 450 x10E3/uL 326 328 305    BMP Latest Ref Rng & Units 03/27/2016 11/06/2014 11/02/2013  Glucose 65 - 99 mg/dL 86 87 115(H)  BUN 6 - 20 mg/dL 10 10 10   Creatinine 0.44 - 1.00 mg/dL 0.65 0.77 0.69  Sodium 135 - 145 mmol/L 139 139 139  Potassium 3.5 - 5.1 mmol/L 4.3 4.1 3.4(L)  Chloride 101 - 111 mmol/L 101 99(L) 99  CO2 22 - 32 mmol/L 25 27 22   Calcium 8.9 - 10.3 mg/dL 9.8 9.7 9.1    BNP No results found for: BNP  ProBNP No results found for: PROBNP  PFT    Component Value Date/Time   FEV1PRE 2.08 10/16/2017 1355   FEV1POST 2.10 10/16/2017 1355   FVCPRE 2.60 10/16/2017 1355   FVCPOST 2.55 10/16/2017 1355   TLC 4.43 10/16/2017 1355   DLCOUNC 15.92 10/16/2017 1355   PREFEV1FVCRT 80 10/16/2017 1355   PSTFEV1FVCRT 82 10/16/2017 1355    No results found.   Past medical hx Past Medical History:  Diagnosis Date  . Anemia    pernicious anemia - 1991   .  Anxiety   . Arthritis    rheumatoid arthritis   . Chronic fatigue   . Cystitis, interstitial   . Depression   . Disc disorder of lumbar region 2013  . Fibromyalgia   . GERD (gastroesophageal reflux disease)   . History of kidney stones   . Hypothyroidism   . Lyme disease    hx of possible   . 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 Osteoporosis   . Pneumonia    hx of   . Rheumatoid arthritis ([place])      Social History   Tobacco Use  . Smoking  status: Former Smoker    Packs/day: 2.00    Years: 20.00    Pack years: 40.00    Types: Cigarettes    Last attempt to quit: 02/11/1990    Years since quitting: 27.7  . Smokeless tobacco: Never Used  Substance Use Topics  . Alcohol use: Yes    Alcohol/week: 14.0 standard drinks    Types: 14 Standard drinks or equivalent per week    Comment: drink per nite scotch  . Drug use: No    Ms.Neto reports that she quit smoking about 27 years ago. Her smoking use included cigarettes. She has a 40.00 pack-year smoking history. She has never used smokeless tobacco. She reports that she drinks about 14.0 standard drinks of alcohol per week. She reports that she does not use drugs.  Tobacco Cessation: Former 40 pack year smoking history quit 1992  Past surgical hx, Family hx, Social hx all reviewed.  Current Outpatient Medications on File Prior to Visit  Medication Sig  . acetaminophen (TYLENOL) 500 MG tablet Take 1,000 mg by mouth every 6 (six) hours as needed for mild pain. For pain  . albuterol (PROVENTIL HFA;VENTOLIN HFA) 108 (90 Base) MCG/ACT inhaler Inhale 2 puffs into the lungs every 6 (six) hours as needed for wheezing or shortness of breath. For shortness of breath  . ALPRAZolam (XANAX) 0.25 MG tablet Take 0.125 mg by mouth daily as needed for anxiety. Pt takes 1/2 tablet as needed   . calcium carbonate (OS-CAL - DOSED IN MG OF ELEMENTAL CALCIUM) 1250 MG tablet Take 1 tablet by mouth  daily.  Marland Kitchen FLUoxetine (PROZAC) 20 MG tablet Take 20 mg by mouth 2 (two) times daily.  . fluticasone (FLONASE) 50 MCG/ACT nasal spray Place 1 spray into both nostrils daily as needed for allergies or rhinitis.  Marland Kitchen guaiFENesin (MUCINEX) 600 MG 12 hr tablet Take 600 mg by mouth 2 (two) times daily as needed for to loosen phlegm.  Marland Kitchen levothyroxine (SYNTHROID, LEVOTHROID) 100 MCG tablet Take 50-100 mcg by mouth See admin instructions. TAKES 100 MCG DAILY EXCEPT ON THURSDAYS AND TAKES 50 MCG  . Melatonin 3 MG CAPS Take 3 mg by mouth at bedtime as needed (SLEEP).  . montelukast (SINGULAIR) 10 MG tablet Take 1 tablet (10 mg total) by mouth at bedtime.  . naproxen sodium (ANAPROX) 220 MG tablet Take 440 mg by mouth 2 (two) times daily as needed (PAIN).  Marland Kitchen omeprazole (PRILOSEC) 40 MG capsule Take 1 capsule (40 mg total) by mouth daily.  . ranitidine (ZANTAC) 300 MG tablet Take 1 tablet (300 mg total) by mouth at bedtime.  Marland Kitchen umeclidinium-vilanterol (ANORO ELLIPTA) 62.5-25 MCG/INH AEPB Inhale 1 puff into the lungs daily.  Marland Kitchen umeclidinium-vilanterol (ANORO ELLIPTA) 62.5-25 MCG/INH AEPB Inhale 1 puff into the lungs daily.  . Vitamin D, Ergocalciferol, (DRISDOL) 50000 UNITS CAPS Take 50,000 Units by mouth every 7 (seven) days. On Mondays   No current facility-administered medications on file prior to visit.      Allergies  Allergen Reactions  . Amoxicillin Nausea And Vomiting     Has patient had a PCN reaction causing immediate rash, facial/tongue/throat swelling, SOB or lightheadedness with hypotension: #  #  #  YES  #  #  #  Has patient had a PCN reaction causing severe rash involving mucus membranes or skin necrosis: No Has patient had a PCN reaction that required hospitalization No Has patient had a PCN reaction occurring within the last 10 years: #  #  #  YES  #  #  #  If all of the above answers are "NO", then may proceed with Cephalosporin use.  . Augmentin [Amoxicillin-Pot Clavulanate] Nausea And  Vomiting  . Demerol [Meperidine Hcl] Nausea And Vomiting  . Sulfa Antibiotics Nausea And Vomiting    Review Of Systems:  Constitutional:   No  weight loss, night sweats,  Fevers, chills, fatigue, or  lassitude.  HEENT:   No headaches,  Difficulty swallowing,  Tooth/dental problems, or  Sore throat,                No sneezing, itching, ear ache, nasal congestion, occasional post nasal drip,   CV:  No chest pain,  Orthopnea, PND, swelling in lower extremities, anasarca, dizziness, palpitations, syncope.   GI  No heartburn, indigestion, abdominal pain, nausea, vomiting, diarrhea, change in bowel habits, loss of appetite, bloody stools.   Resp: No shortness of breath with exertion or at rest.  Minimal  excess mucus, no productive cough,  Occasional  non-productive cough,  No coughing up of blood.  No change in color of mucus.  No wheezing.  No chest wall deformity  Skin: no rash or lesions.  GU: no dysuria, change in color of urine, no urgency or frequency.  No flank pain, no hematuria   MS:  No joint pain or swelling.  No decreased range of motion.  No back pain.  Psych:  No change in mood or affect. No depression or anxiety.  No memory loss.   Vital Signs BP (!) 156/90 (BP Location: Right Arm, Cuff Size: Normal)   Pulse 83   Ht 5' (1.524 m)   Wt 106 lb (48.1 kg)   SpO2 100%   BMI 20.70 kg/m    Physical Exam:  General- No distress,  A&Ox3, pleasant, thin frame ENT: No sinus tenderness, TM clear, pale nasal mucosa, no oral exudate,no post nasal drip, no LAN Cardiac: S1, S2, regular rate and rhythm, no murmur Chest: No wheeze/ rales/ dullness; no accessory muscle use, no nasal flaring, no sternal retractions Abd.: Soft Non-tender, Body mass index is 20.7 kg/m., ND BS + Ext: No clubbing cyanosis, edema Neuro:  normal strength, MAE x 4, A&O x 3 Skin: No rashes, warm and dry Psych: normal mood and behavior   Assessment/Plan  Sinusitis Resolved without need for Z  pack Clear secretions No frontal or sinus tenderness Plan: Monitor and close follow up at any sign of recurrance  Allergic rhinitis Seen by allergy 10/07/2017 Currently asymptomatic and doing well Plan: Follow up with Dr. Neldon Mc 10/1/ 2019 as is scheduled Continue current regimen >>Singulair, Flonase  GERD (gastroesophageal reflux disease) Started on aggressive reflux treatment per Dr. Neldon Mc States her symptoms and cough are much better with treatment Plan: Continue omeprazole 40 mg once daily Continue zantac 300 mg  once daily Follow up with Dr. Neldon Mc 11/11/2017   COPD with chronic bronchitis and emphysema ([place]) PFT's essentially normal Will continue Anoro as patient feels she is getting benefit and if finally " better" with her current regimen. Consider therapeutic trial off Anoro in near future  Need for vaccination for Strep pneumoniae Strep pneumoniae 23 Serotypes IgG low on 15 of the 23 phenotypes Plan: Pneumovax and then repeat pneumo 24 testing 6 weeks after immunization     Flu vaccine need Did not want to get pneumovax and flu vaccine on the same day Plan: Will need Flu vaccine at follow up     [name], NP 10/22/2017  4:05  PM

## 2017-10-22 NOTE — Assessment & Plan Note (Signed)
Started on aggressive reflux treatment per Dr. Neldon Mc States her symptoms and cough are much better with treatment Plan: Continue omeprazole 40 mg once daily Continue zantac 300 mg  once daily Follow up with Dr. Neldon Mc 11/11/2017

## 2017-10-22 NOTE — Assessment & Plan Note (Addendum)
Seen by allergy 10/07/2017 Currently asymptomatic and doing well Plan: Follow up with Dr. Neldon Mc 10/1/ 2019 as is scheduled Continue current regimen >>Singulair, Flonase

## 2017-11-05 ENCOUNTER — Encounter: Payer: Self-pay | Admitting: Pulmonary Disease

## 2017-11-05 ENCOUNTER — Ambulatory Visit (INDEPENDENT_AMBULATORY_CARE_PROVIDER_SITE_OTHER): Payer: Medicare Other | Admitting: Pulmonary Disease

## 2017-11-05 VITALS — BP 126/76 | HR 86 | Wt 105.8 lb

## 2017-11-05 DIAGNOSIS — J309 Allergic rhinitis, unspecified: Secondary | ICD-10-CM

## 2017-11-05 DIAGNOSIS — K219 Gastro-esophageal reflux disease without esophagitis: Secondary | ICD-10-CM | POA: Diagnosis not present

## 2017-11-05 DIAGNOSIS — Z23 Encounter for immunization: Secondary | ICD-10-CM | POA: Diagnosis not present

## 2017-11-05 DIAGNOSIS — J449 Chronic obstructive pulmonary disease, unspecified: Secondary | ICD-10-CM | POA: Diagnosis not present

## 2017-11-05 NOTE — Progress Notes (Signed)
Synopsis: Referred in July 2019 for recurrent bronchitis episodes ever since August 2015.; smoked 2 packs of cigarettes daily for 20 years, quit in 1992.  High-resolution CT scanning of the chest showed mild centrilobular emphysema but lung function testing showed no evidence of airflow obstruction.  Subjective:   PATIENT ID: Pamela Larson Kinsella GENDER: female DOB: [birth year], MRN: [id number]   HPI  Chief Complaint  Patient presents with  . Follow-up    feeling better, sinus drainage     Aalia has been doing fairly well since the last visit.  She says that since she has started taking the antacids she has noticed a significant improvement in her symptoms of cough and congestion.  She had an upper respiratory infection which was not treated with an antibiotic and she improved.  She has been taking Anoro 1 puff daily.  She says that this seems to help.  She denies any problems with shortness of breath.  She had a Pneumovax a few weeks ago and a repeat pneumococcal serology is to be performed soon peer   Past Medical History:  Diagnosis Date  . Anemia    pernicious anemia - 1991   . Anxiety   . Arthritis    rheumatoid arthritis   . Chronic fatigue   . Cystitis, interstitial   . Depression   . Disc disorder of lumbar region 2013  . Fibromyalgia   . GERD (gastroesophageal reflux disease)   . History of kidney stones   . Hypothyroidism   . Lyme disease    hx of possible   . 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 Osteoporosis   . Pneumonia    hx of   . Rheumatoid arthritis ([place])      Review of Systems  Constitutional: Positive for weight loss. Negative for fever.  HENT: Positive for congestion and sore throat. Negative for ear pain and nosebleeds.   Eyes: Negative for redness.  Respiratory: Positive for cough, shortness of breath and wheezing.   Cardiovascular: Negative for palpitations, leg swelling and PND.  Gastrointestinal:  Positive for nausea. Negative for vomiting.  Genitourinary: Negative for dysuria.  Skin: Negative for rash.  Neurological: Negative for headaches.  Endo/Heme/Allergies: Does not bruise/bleed easily.  Psychiatric/Behavioral: Negative for depression. The patient is not nervous/anxious.       Objective:  Physical Exam   Vitals:   11/05/17 1201  BP: 126/76  Pulse: 86  SpO2: 97%  Weight: 105 lb 12.8 oz (48 kg)    RA  Gen: well appearing HENT: OP clear, TM's clear, neck supple PULM: CTA B, normal percussion CV: RRR, no mgr, trace edema GI: BS+, soft, nontender Derm: no cyanosis or rash Psyche: normal mood and affect    CBC    Component Value Date/Time   WBC 7.9 10/07/2017 1442   WBC 6.1 03/27/2016 1146   RBC 4.24 10/07/2017 1442   RBC 4.08 03/27/2016 1146   HGB 14.8 10/07/2017 1442   HCT 43.1 10/07/2017 1442   PLT 326 10/07/2017 1442   MCV 102 (H) 10/07/2017 1442   MCH 34.9 (H) 10/07/2017 1442   MCH 34.3 (H) 03/27/2016 1146   MCHC 34.3 10/07/2017 1442   MCHC 33.2 03/27/2016 1146   RDW 13.5 10/07/2017 1442   LYMPHSABS 1.1 10/07/2017 1442   MONOABS 0.5 11/02/2013 0432   EOSABS 0.0 10/07/2017 1442   BASOSABS 0.0 10/07/2017 1442     Chest imaging: Chest x-ray  from August 2015 was independently reviewed showing possible emphysematous changes (increased AP diameter) with airway thickening in the right lower lobe notable 08/27/2017 HRCT images independently reviewed: Grossly normal pulmonary parenchyma, some mild centrilobular emphysema and airway thickening noted.  Aortic atherosclerosis noted.  PFT: September 2019 ratio 80%, FEV1 2.10 L 110% predicted, total lung capacity 4.43 L 99% predicted, DLCO 15.92 mL 84% predicted  Labs: 2019 eosinophil count 0, IgE August 27th 2019 7  Path:  Echo:  Heart Catheterization:  Records from her visit with Dr. Neldon Mc on October 07, 2017 reviewed.  He noted that she had evidence of centrilobular emphysema, perennial  allergic rhinitis and gastroesophageal reflux disease.     Assessment & Plan:   COPD with chronic bronchitis and emphysema ([place])  Need for pneumococcal vaccine  Gastroesophageal reflux disease, esophagitis presence not specified  Allergic rhinitis, unspecified seasonality, unspecified trigger  Discussion: This has been a stable interval.  She has worsening bronchitis symptoms in the face of viral infections because of underlying gastroesophageal reflux disease.  However, this seems to be much better than it was previously now that she has been taking antacid therapy.  I explained to her today that she has very mild centrilobular emphysema from her 40-pack-year smoking history so this and her acid reflux is why she tends to have a long duration of symptoms after an episode of bronchitis.  It is not clear to me if she has an immunodeficiency or not, it will be helpful to see her response to vaccination on repeat pneumococcal serology testing.  Plan:   In the event of an upper respiratory infection I would try to avoid antibiotics if possible If you have sinus congestion I recommend taking phenylephrine, saline sprays, and Mucinex If you have a cough I recommend taking Delsym  Very mild centrilobular emphysema: Keep taking Anoro 1 puff daily no matter how you feel On the next visit if you are still doing well from a breathing standpoint we can consider stopping this medicine Get a high-dose flu shot when they are available  Recurrent respiratory infections: Keep follow-up with Dr. Neldon Mc to figure out if there is evidence of an immunodeficiency  Gastroesophageal reflux disease: Keep taking pantoprazole and famotidine  We will see you back in 4 to 6 months or sooner if needed  > 50% of this 25 minute visit face to face  Current Outpatient Medications:  .  acetaminophen (TYLENOL) 500 MG tablet, Take 1,000 mg by mouth every 6 (six) hours as needed for mild pain. For pain, Disp: ,  Rfl:  .  albuterol (PROVENTIL HFA;VENTOLIN HFA) 108 (90 Base) MCG/ACT inhaler, Inhale 2 puffs into the lungs every 6 (six) hours as needed for wheezing or shortness of breath. For shortness of breath, Disp: 1 Inhaler, Rfl: 1 .  ALPRAZolam (XANAX) 0.25 MG tablet, Take 0.125 mg by mouth daily as needed for anxiety. Pt takes 1/2 tablet as needed , Disp: , Rfl:  .  calcium carbonate (OS-CAL - DOSED IN MG OF ELEMENTAL CALCIUM) 1250 MG tablet, Take 1 tablet by mouth daily., Disp: , Rfl:  .  FLUoxetine (PROZAC) 20 MG tablet, Take 20 mg by mouth 2 (two) times daily., Disp: , Rfl:  .  fluticasone (FLONASE) 50 MCG/ACT nasal spray, Place 1 spray into both nostrils daily as needed for allergies or rhinitis., Disp: , Rfl:  .  guaiFENesin (MUCINEX) 600 MG 12 hr tablet, Take 600 mg by mouth 2 (two) times daily as needed for to loosen phlegm.,  Disp: , Rfl:  .  levothyroxine (SYNTHROID, LEVOTHROID) 100 MCG tablet, Take 50-100 mcg by mouth See admin instructions. TAKES 100 MCG DAILY EXCEPT ON THURSDAYS AND TAKES 50 MCG, Disp: , Rfl:  .  Melatonin 3 MG CAPS, Take 3 mg by mouth at bedtime as needed (SLEEP)., Disp: , Rfl:  .  montelukast (SINGULAIR) 10 MG tablet, Take 1 tablet (10 mg total) by mouth at bedtime., Disp: 30 tablet, Rfl: 5 .  naproxen sodium (ANAPROX) 220 MG tablet, Take 440 mg by mouth 2 (two) times daily as needed (PAIN)., Disp: , Rfl:  .  omeprazole (PRILOSEC) 40 MG capsule, Take 1 capsule (40 mg total) by mouth daily., Disp: 90 capsule, Rfl: 1 .  ranitidine (ZANTAC) 300 MG tablet, Take 1 tablet (300 mg total) by mouth at bedtime., Disp: 30 tablet, Rfl: 5 .  umeclidinium-vilanterol (ANORO ELLIPTA) 62.5-25 MCG/INH AEPB, Inhale 1 puff into the lungs daily., Disp: 60 each, Rfl: 5 .  Vitamin D, Ergocalciferol, (DRISDOL) 50000 UNITS CAPS, Take 50,000 Units by mouth every 7 (seven) days. On Mondays, Disp: , Rfl:

## 2017-11-05 NOTE — Patient Instructions (Signed)
In the event of an upper respiratory infection I would try to avoid antibiotics if possible If you have sinus congestion I recommend taking phenylephrine, saline sprays, and Mucinex If you have a cough I recommend taking Delsym  Very mild centrilobular emphysema: Keep taking Anoro 1 puff daily no matter how you feel On the next visit if you are still doing well from a breathing standpoint we can consider stopping this medicine Get a high-dose flu shot when they are available  Recurrent respiratory infections: Keep follow-up with Dr. Neldon Mc to figure out if there is evidence of an immunodeficiency  Gastroesophageal reflux disease: Keep taking pantoprazole and famotidine  We will see you back in 4 to 5 months or sooner if need

## 2017-11-11 ENCOUNTER — Ambulatory Visit (INDEPENDENT_AMBULATORY_CARE_PROVIDER_SITE_OTHER): Payer: Medicare Other | Admitting: Allergy and Immunology

## 2017-11-11 ENCOUNTER — Encounter: Payer: Self-pay | Admitting: Allergy and Immunology

## 2017-11-11 VITALS — BP 122/68 | HR 80 | Resp 16

## 2017-11-11 DIAGNOSIS — J449 Chronic obstructive pulmonary disease, unspecified: Secondary | ICD-10-CM

## 2017-11-11 DIAGNOSIS — K219 Gastro-esophageal reflux disease without esophagitis: Secondary | ICD-10-CM

## 2017-11-11 DIAGNOSIS — J3089 Other allergic rhinitis: Secondary | ICD-10-CM | POA: Diagnosis not present

## 2017-11-11 DIAGNOSIS — B999 Unspecified infectious disease: Secondary | ICD-10-CM

## 2017-11-11 NOTE — Progress Notes (Signed)
Follow-up Note  Referring Provider: Marton Redwood, MD Primary Provider: Marton Redwood, MD Date of Office Visit: 11/11/2017  Subjective:   Pamela Larson (DOB: 05-22-1945) is a 72 y.o. female who returns to the [facility] [place] on 11/11/2017 in re-evaluation of the following:  HPI: Pamela Larson returns to this clinic in reevaluation of her respiratory tract issue addressed during her initial evaluation 07 October 2017.  She appeared to have a combination of reflux induced respiratory disease in the form of LPR, emphysema from prolonged tobacco use, and a history of rhinitis and recurrent infections.  She believes that she is significantly improved regarding her respiratory tract.  A lot of her chronic coughing and throat clearing and recurrent sore throats and head congestion have resolved.  She did have a upper respiratory tract infection recently but this did not apparently resulted in a significant lower airway issue and resolved on its own without antibiotic administration.  She has eliminated all caffeine consumption.  She still continues to drink 2 alcoholic drinks at nighttime.  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  If she does not drink alcohol before sleep she will not get to sleep for several hours and then her sleep cycle shifts from 3 AM until 12 noon.  She will be discussing this issue further with her primary care doctor to see if there is any alternative besides utilizing alcohol for this issue.  Based upon her low titers of anti-pneumococcal antibodies she had a Pneumovax administered several weeks ago.  Allergies as of 11/11/2017      Reactions   Amoxicillin Nausea And Vomiting   Has patient had a PCN reaction causing immediate rash, facial/tongue/throat swelling, SOB or lightheadedness with hypotension: #  #  #  YES  #  #  #  Has patient had a PCN reaction causing severe rash involving mucus membranes or skin necrosis: No Has patient had  a PCN reaction that required hospitalization No Has patient had a PCN reaction occurring within the last 10 years: #  #  #  YES  #  #  #  If all of the above answers are "NO", then may proceed with Cephalosporin use.   Augmentin [amoxicillin-pot Clavulanate] Nausea And Vomiting   Demerol [meperidine Hcl] Nausea And Vomiting   Sulfa Antibiotics Nausea And Vomiting      Medication List      acetaminophen 500 MG tablet Commonly known as:  TYLENOL Take 1,000 mg by mouth every 6 (six) hours as needed for mild pain. For pain   albuterol 108 (90 Base) MCG/ACT inhaler Commonly known as:  PROVENTIL HFA;VENTOLIN HFA Inhale 2 puffs into the lungs every 6 (six) hours as needed for wheezing or shortness of breath. For shortness of breath   ALPRAZolam 0.25 MG tablet Commonly known as:  XANAX Take 0.125 mg by mouth daily as needed for anxiety. Pt takes 1/2 tablet as needed   calcium carbonate 1250 (500 Ca) MG tablet Commonly known as:  OS-CAL - dosed in mg of elemental calcium Take 1 tablet by mouth daily.   FLUoxetine 20 MG tablet Commonly known as:  PROZAC Take 20 mg by mouth 2 (two) times daily.   fluticasone 50 MCG/ACT nasal spray Commonly known as:  FLONASE Place 1 spray into both nostrils daily as needed for allergies or rhinitis.   guaiFENesin 600 MG 12 hr tablet Commonly known as:  MUCINEX Take 600 mg by mouth 2 (two) times daily as  needed for to loosen phlegm.   levothyroxine 100 MCG tablet Commonly known as:  SYNTHROID, LEVOTHROID Take 50-100 mcg by mouth See admin instructions. TAKES 100 MCG DAILY EXCEPT ON THURSDAYS AND TAKES 50 MCG   Melatonin 3 MG Caps Take 3 mg by mouth at bedtime as needed (SLEEP).   montelukast 10 MG tablet Commonly known as:  SINGULAIR Take 1 tablet (10 mg total) by mouth at bedtime.   naproxen sodium 220 MG tablet Commonly known as:  ALEVE Take 440 mg by mouth 2 (two) times daily as needed (PAIN).   omeprazole 40 MG capsule Commonly known  as:  PRILOSEC Take 1 capsule (40 mg total) by mouth daily.   ranitidine 300 MG tablet Commonly known as:  ZANTAC Take 1 tablet (300 mg total) by mouth at bedtime.   umeclidinium-vilanterol 62.5-25 MCG/INH Aepb Commonly known as:  ANORO ELLIPTA Inhale 1 puff into the lungs daily.   Vitamin D (Ergocalciferol) 50000 units Caps capsule Commonly known as:  DRISDOL Take 50,000 Units by mouth every 7 (seven) days. On Mondays       Past Medical History:  Diagnosis Date  . Anemia    pernicious anemia - 1991   . Anxiety   . Arthritis    rheumatoid arthritis   . Chronic fatigue   . Cystitis, interstitial   . Depression   . Disc disorder of lumbar region 2013  . Fibromyalgia   . GERD (gastroesophageal reflux disease)   . History of kidney stones   . Hypothyroidism   . Lyme disease    hx of possible   . 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 Osteoporosis   . Pneumonia    hx of   . Rheumatoid arthritis [facility])     Past Surgical History:  Procedure Laterality Date  . APPENDECTOMY  1978  . bladder hydrodistention  1998  . BREAST SURGERY     reduction   . CERVICAL DISC ARTHROPLASTY N/A 04/03/2016   Procedure: CERVICAL SIX CERVICAL SEVEN [place] ARTHROPLASTY;  Surgeon: Jovita Gamma, MD;  Location: [facility];  Service: Neurosurgery;  Laterality: N/A;  left side approach  . COSMETIC SURGERY  2005   face lift  . EXCISION MORTON'S NEUROMA Left 05/08/2017   Procedure: EXCISION MORTON'S NEUROMA Left 3rd webspace;  Surgeon: [name], MD;  Location: [facility];  Service: Orthopedics;  Laterality: Left;  . EYE SURGERY    . LUMBAR LAMINECTOMY/DECOMPRESSION MICRODISCECTOMY  06/10/2011   Procedure: LUMBAR LAMINECTOMY/DECOMPRESSION MICRODISCECTOMY 1 LEVEL;  Surgeon: Hosie Spangle, MD;  Location: [facility];  Service: Neurosurgery;  Laterality: Left;  Lumbar Five Sacral One Lumbar Laminectomy Decompression Microdiscectomy      Review of systems negative except as noted in HPI / PMHx or noted below:  Review of Systems  Constitutional: Negative.   HENT: Negative.   Eyes: Negative.   Respiratory: Negative.   Cardiovascular: Negative.   Gastrointestinal: Negative.   Genitourinary: Negative.   Musculoskeletal: Negative.   Skin: Negative.   Neurological: Negative.   Endo/Heme/Allergies: Negative.   Psychiatric/Behavioral: Negative.      Objective:   Vitals:   11/11/17 1148  BP: 122/68  Pulse: 80  Resp: 16  SpO2: 97%          Physical Exam  HENT:  Head: Normocephalic.  Right Ear: Tympanic membrane, external ear and ear canal normal.  Left Ear: Tympanic membrane, external ear and ear canal normal.  Nose: Nose normal. No mucosal edema or rhinorrhea.  Mouth/Throat: Uvula is midline, oropharynx is clear and moist and mucous membranes are normal. No oropharyngeal exudate.  Eyes: Conjunctivae are normal.  Neck: Trachea normal. No tracheal tenderness present. No tracheal deviation present. No thyromegaly present.  Cardiovascular: Normal rate, regular rhythm, S1 normal, S2 normal and normal heart sounds.  No murmur heard. Pulmonary/Chest: Breath sounds normal. No stridor. No respiratory distress. She has no wheezes. She has no rales.  Musculoskeletal: She exhibits no edema.  Lymphadenopathy:       Head (right side): No tonsillar adenopathy present.       Head (left side): No tonsillar adenopathy present.    She has no cervical adenopathy.  Neurological: She is alert.  Skin: No rash noted. She is not diaphoretic. No erythema. Nails show no clubbing.    Diagnostics:    Spirometry was performed and demonstrated an FEV1 of 1.78 at 80 % of predicted.  The patient had an Asthma Control Test with the following results: ACT Total Score: 24.    Results of blood tests obtained 07 October 2017 identified WBC 7.9, absolute eosinophil 0, absolute lymphocyte 1100, hemoglobin 14.8, platelet 326, IgG 945  NG/DL, IgM 48 mg/DL, IgA 85 NG/DL, IgE 7 IU/mL, antitetanus IgG antibody greater than 7.0 IU/mL, and multiple serotypes of pneumococcus with inadequate antibody titers 15/23  Assessment and Plan:   1. LPRD (laryngopharyngeal reflux disease)   2. Recurrent infections   3. Perennial allergic rhinitis   4. COPD with chronic bronchitis and emphysema ([place])     1.  Continue to Treat and prevent inflammation:   A. Flonase 1-2 sprays each nostril 1 time per day  B. Anoro -1 inhalation 1 time per day  C.  montelukast 10 mg tablet 1 time per day  2.  Continue to Treat and prevent reflux:   A.  Continue to consolidate caffeine and alcohol consumption  B.  Omeprazole 40 mg tablet in a.m.  C.  Ranitidine 300 mg tablet in p.m.  3.  If needed:   A.  OTC antihistamine  B.  Nasal saline  C.  Proventil HFA or similar 2 inhalations every 4-6 hours  4.  Obtain Pneumo 23 titers 6 weeks after Pneumovax   5.  Return to clinic in December 2019 or earlier if problem  6.  Obtain fall flu vaccine.     I think that Graelyn is doing a lot better at this point in time.  I would like to continue to have her use this plan for a full 12 weeks and thus I will see her back in this clinic in December 2019.  We will check her anti-pneumococcal serotype titers 6 weeks after administration of her Pneumovax.  Allena Katz, MD Allergy / Immunology [place]

## 2017-11-11 NOTE — Patient Instructions (Addendum)
  1.  Continue to Treat and prevent inflammation:   A.  Flonase 1-2 sprays each nostril 1 time per day  B.  Anoro -1 inhalation 1 time per day  C.  montelukast 10 mg tablet 1 time per day  2.  Continue to Treat and prevent reflux:   A.  Continue to consolidate caffeine and alcohol consumption  B.  Omeprazole 40 mg tablet in a.m.  C.  Ranitidine 300 mg tablet in p.m.  3.  If needed:   A.  OTC antihistamine  B.  Nasal saline  C.  Proventil HFA or similar 2 inhalations every 4-6 hours  4.  Obtain Pneumo 23 titers 6 weeks after Pneumovax   5.  Return to clinic in December 2019 or earlier if problem  6.  Obtain fall flu vaccine.

## 2017-11-12 ENCOUNTER — Encounter: Payer: Self-pay | Admitting: Allergy and Immunology

## 2017-11-17 DIAGNOSIS — H02832 Dermatochalasis of right lower eyelid: Secondary | ICD-10-CM | POA: Diagnosis not present

## 2017-11-17 DIAGNOSIS — H02835 Dermatochalasis of left lower eyelid: Secondary | ICD-10-CM | POA: Diagnosis not present

## 2017-11-17 DIAGNOSIS — H02135 Senile ectropion of left lower eyelid: Secondary | ICD-10-CM | POA: Diagnosis not present

## 2017-11-17 DIAGNOSIS — H02132 Senile ectropion of right lower eyelid: Secondary | ICD-10-CM | POA: Diagnosis not present

## 2017-11-19 ENCOUNTER — Encounter: Payer: Self-pay | Admitting: Acute Care

## 2017-11-19 ENCOUNTER — Ambulatory Visit (INDEPENDENT_AMBULATORY_CARE_PROVIDER_SITE_OTHER): Payer: Medicare Other | Admitting: Acute Care

## 2017-11-19 DIAGNOSIS — J309 Allergic rhinitis, unspecified: Secondary | ICD-10-CM

## 2017-11-19 DIAGNOSIS — J0110 Acute frontal sinusitis, unspecified: Secondary | ICD-10-CM | POA: Diagnosis not present

## 2017-11-19 DIAGNOSIS — Z23 Encounter for immunization: Secondary | ICD-10-CM | POA: Diagnosis not present

## 2017-11-19 DIAGNOSIS — J449 Chronic obstructive pulmonary disease, unspecified: Secondary | ICD-10-CM

## 2017-11-19 DIAGNOSIS — K219 Gastro-esophageal reflux disease without esophagitis: Secondary | ICD-10-CM

## 2017-11-19 NOTE — Progress Notes (Signed)
History of Present Illness Pamela Larson is a 72 y.o. female  female former smoker ( quit 1992 with a 40 pack year smoking history) with recurrent infections, and bronchitis. She is followed by Dr. Lake Bells  Synopsis: Referred inJuly 2052for recurrent bronchitis episodes ever since August 2015.;smoked 2 packs of cigarettes daily for 20 years, quit in 1992 with a 40 pack year smoking history.  Recent [place] [facility] Encounters:  08/20/2017- initial office visit-[place]  patient reporting back in 2015 she had a serious lung infection. Since then has been to the doctor about every 6 months where she gets sick. Patient reports she had Lyme disease diagnosed back in 1991. She says she had bronchitis-like kids do when she was a child typically about every other year. Patient with a 40-pack-year smoking history. Chest x-ray from August 2015 showed possible emphysema and airway thickening in right lower lobe. Plan: High-res CT, pulmonary function test, sputum cultures, follow-up in 2 weeks  09/03/2017- office visit-SG Patient reviewed high-res CT, spirometry as well as sputum cultures today. Patient with nasal congestion. CT high-res was negative for bronchiectasis but did show some emphysema. Plan: Referral to allergy (Dr. Neldon Mc), therapeutic trial of Anoro Ellipta, scheduled for PFTs, follow-up in 2 months with Dr. Lake Bells, continue Zyrtec  09/10/17- OV - SG Early sinusitis, treated with Z-Pak, continue Anoro  09/24/2017-office visit-BW Patient doing better  09/25/2017>>Acute OV: BM- suspected viral URI as + sick contact with grandchild Allergic rhinitis>> adding Nettie Pott rinses and Chlorphenhydramine for allergy symptoms to her regimen Sinusitis was improving Keep appointment with Dr. Neldon Mc Keep PFT appointment  10/07/2017 OV Dr. Neldon Mc Pt. appears to have some respiratory tract inflammation and irritation but doubt that atopic disease is driving this issue.   History very consistent with reflux induced respiratory disease and we will get her to aggressively treat reflux at this point and she does have a prior history of tobacco use which probably changedthe architecture of her lung to some degree. She does have some evidence of airway obstruction and air trapping and she will continue use anti-inflammatory agents as she has been doing. Positive  history of recurrent renal infections and to be complete we will make sure that she has an intact B-cell immune system with the blood tests noted above. She has follow up 11/11/2017 to evaluate her response to treatment. >> Dr. Neldon Mc   10/16/2017>> Acute OV - BM- for acute left frontal maxillary sinusitis Asked to wait 3 days to see if symptoms clear. Prescribed Azithro in the event this does not clear in 3-5 days.   10/23/2017 Follow up>> SG-  Did not need z pack for sinusitis,She was seen by Dr. Neldon Mc. Allergy testing was not significant for anything. 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 She has been started on Singulair. Her strep pneumonia antibodies are low and she got  Prevnar 10/22/2017. Will need repeat pneumo 24 testing 6 weeks after immunization. (10/23 or after)   11/19/2017  Follow up: Pt. Presents for follow up. She states she has been doing well. She is compliant with her GERD regimen, sinusitis regimen and allergic rhinitis regimen. She states she does have some nasal dryness. She is using a saline with aloe rinse , which has helped her nasal discomfort.She is not having to blow her nose in the mornings.She has not needed antibiotic  since 08/2017. She    Test Results: 08/20/2017-spirometry-normal spirometry 08/22/2017- sputum culture, AFB, fungal all negative 08/27/2017-CT chest high-res- no ILD, mild diffuse  bronchial wall thickening with very mild paraseptal emphysema Lab Results: 10/07/2017 IgE>> 71 IU/ml IgA>> 85 mg/dl ( Low) IgM>> 48 mg/dl IgG>> 945 mg/dl Anti pneumococcal ab>> Pneumo Ab Type 1* >1.3 ug/mL 1.9   Pneumo Ab Type 3* >1.3 ug/mL 0.7Low    Pneumo Ab Type 4* >1.3 ug/mL <0.1Low    Pneumo Ab Type 8* >1.3 ug/mL 0.3Low    Pneumo Ab Type 9 (9N)* >1.3 ug/mL 0.3Low    Pneumo Ab Type 12 (652F)* >1.3 ug/mL <0.1Low    Pneumo Ab Type 14* >1.3 ug/mL 0.9Low    Pneumo Ab Type 17 (70F)* >1.3 ug/mL 1.0Low    Pneumo Ab Type 19 (70F)* >1.3 ug/mL 2.7   Pneumo Ab Type 2* >1.3 ug/mL 0.7Low    Pneumo Ab Type 20* >1.3 ug/mL 0.8Low    Pneumo Ab Type 22 (82F)* >1.3 ug/mL 0.1Low    Pneumo Ab Type 23 (252F)* >1.3 ug/mL 0.2Low    Pneumo Ab Type 26 (6B)* >1.3 ug/mL <0.1Low    Pneumo Ab Type 34 (10A)* >1.3 ug/mL 1.7   Pneumo Ab Type 43 (11A)* >1.3 ug/mL 1.4   Pneumo Ab Type 5* >1.3 ug/mL 5.4   Pneumo Ab Type 51 (52F)* >1.3 ug/mL 0.4Low    Pneumo Ab Type 54 (15B)* >1.3 ug/mL 0.1Low    Pneumo Ab Type 56 (18C)* >1.3 ug/mL 2.3   Pneumo Ab Type 57 (19A)* >1.3 ug/mL 3.3   Pneumo Ab Type 68 (9V)* >1.3 ug/mL 0.2Low    Pneumo Ab Type 70 (70F)* >1.3 ug      CBC Latest Ref Rng & Units 10/07/2017 03/27/2016 11/06/2014  WBC 3.4 - 10.8 x10E3/uL 7.9 6.1 8.5  Hemoglobin 11.1 - 15.9 g/dL 14.8 14.0 13.7  Hematocrit 34.0 - 46.6 % 43.1 42.2 41.1  Platelets 150 - 450 x10E3/uL 326 328 305    BMP Latest Ref Rng & Units 03/27/2016 11/06/2014 11/02/2013  Glucose 65 - 99 mg/dL 86 87 115(H)  BUN 6 - 20 mg/dL 10 10 10   Creatinine 0.44 - 1.00 mg/dL 0.65 0.77 0.69  Sodium 135 - 145 mmol/L 139 139 139  Potassium 3.5 - 5.1 mmol/L 4.3 4.1 3.4(L)  Chloride 101 - 111 mmol/L 101 99(L) 99  CO2 22 - 32 mmol/L 25 27 22   Calcium 8.9 - 10.3 mg/dL 9.8 9.7 9.1    BNP No results found for: BNP  ProBNP No results found for: PROBNP  PFT    Component Value Date/Time   FEV1PRE 2.08 10/16/2017 1355   FEV1POST 2.10 10/16/2017 1355     FVCPRE 2.60 10/16/2017 1355   FVCPOST 2.55 10/16/2017 1355   TLC 4.43 10/16/2017 1355   DLCOUNC 15.92 10/16/2017 1355   PREFEV1FVCRT 80 10/16/2017 1355   PSTFEV1FVCRT 82 10/16/2017 1355    No results found.   Past medical hx Past Medical History:  Diagnosis Date  . Anemia    pernicious anemia - 1991   . Anxiety   . Arthritis    rheumatoid arthritis   . Chronic fatigue   . Cystitis, interstitial   . Depression   . Disc disorder of lumbar region 2013  . Fibromyalgia   . GERD (gastroesophageal reflux disease)   . History of kidney stones   . Hypothyroidism   . Lyme disease    hx of possible   . Neuromuscular disorder ([place])    hx  of neuropathy in 1991 due to pernicious anemia   . Osteoporosis   . Pneumonia    hx of   . Rheumatoid arthritis ([place])      Social History   Tobacco Use  . Smoking status: Former Smoker    Packs/day: 2.00    Years: 20.00    Pack years: 40.00    Types: Cigarettes    Last attempt to quit: 02/11/1990    Years since quitting: 27.7  . Smokeless tobacco: Never Used  Substance Use Topics  . Alcohol use: Yes    Alcohol/week: 14.0 standard drinks    Types: 14 Standard drinks or equivalent per week    Comment: drink per nite scotch  . Drug use: No    Ms.Ohnemus reports that she quit smoking about 27 years ago. Her smoking use included cigarettes. She has a 40.00 pack-year smoking history. She has never used smokeless tobacco. She reports that she drinks about 14.0 standard drinks of alcohol per week. She reports that she does not use drugs.  Tobacco Cessation: Counseling given: Not Answered   Past surgical hx, Family hx, Social hx all reviewed.  Current Outpatient Medications on File Prior to Visit  Medication Sig  . acetaminophen (TYLENOL) 500 MG tablet Take 1,000 mg by mouth every 6 (six) hours as needed for mild pain. For pain  . albuterol (PROVENTIL HFA;VENTOLIN HFA) 108 (90 Base) MCG/ACT inhaler Inhale 2 puffs into the lungs every  6 (six) hours as needed for wheezing or shortness of breath. For shortness of breath  . ALPRAZolam (XANAX) 0.25 MG tablet Take 0.125 mg by mouth daily as needed for anxiety. Pt takes 1/2 tablet as needed   . calcium carbonate (OS-CAL - DOSED IN MG OF ELEMENTAL CALCIUM) 1250 MG tablet Take 1 tablet by mouth daily.  Marland Kitchen FLUoxetine (PROZAC) 20 MG tablet Take 20 mg by mouth 2 (two) times daily.  . fluticasone (FLONASE) 50 MCG/ACT nasal spray Place 1 spray into both nostrils daily as needed for allergies or rhinitis.  Marland Kitchen guaiFENesin (MUCINEX) 600 MG 12 hr tablet Take 600 mg by mouth 2 (two) times daily as needed for to loosen phlegm.  Marland Kitchen levothyroxine (SYNTHROID, LEVOTHROID) 100 MCG tablet Take 50-100 mcg by mouth See admin instructions. TAKES 100 MCG DAILY EXCEPT ON THURSDAYS AND TAKES 50 MCG  . Melatonin 3 MG CAPS Take 3 mg by mouth at bedtime as needed (SLEEP).  . montelukast (SINGULAIR) 10 MG tablet Take 1 tablet (10 mg total) by mouth at bedtime.  . naproxen sodium (ANAPROX) 220 MG tablet Take 440 mg by mouth 2 (two) times daily as needed (PAIN).  Marland Kitchen omeprazole (PRILOSEC) 40 MG capsule Take 1 capsule (40 mg total) by mouth daily.  . ranitidine (ZANTAC) 300 MG tablet Take 1 tablet (300 mg total) by mouth at bedtime.  Marland Kitchen umeclidinium-vilanterol (ANORO ELLIPTA) 62.5-25 MCG/INH AEPB Inhale 1 puff into the lungs daily.  . Vitamin D, Ergocalciferol, (DRISDOL) 50000 UNITS CAPS Take 50,000 Units by mouth every 7 (seven) days. On Mondays   No current facility-administered medications on file prior to visit.      Allergies  Allergen Reactions  . Amoxicillin Nausea And Vomiting     Has patient had a PCN reaction causing immediate rash, facial/tongue/throat swelling, SOB or lightheadedness with hypotension: #  #  #  YES  #  #  #  Has patient had a PCN reaction causing severe rash involving mucus membranes or skin necrosis: No Has patient had a PCN  reaction that required hospitalization No Has patient had a  PCN reaction occurring within the last 10 years: #  #  #  YES  #  #  #  If all of the above answers are "NO", then may proceed with Cephalosporin use.  . Augmentin [Amoxicillin-Pot Clavulanate] Nausea And Vomiting  . Demerol [Meperidine Hcl] Nausea And Vomiting  . Sulfa Antibiotics Nausea And Vomiting    Review Of Systems:  Constitutional:   No  weight loss, night sweats,  Fevers, chills, fatigue, or  lassitude.  HEENT:   No headaches,  Difficulty swallowing,  Tooth/dental problems, or  Sore throat,                No sneezing, itching, ear ache, nasal congestion, post nasal drip,   CV:  No chest pain,  Orthopnea, PND, swelling in lower extremities, anasarca, dizziness, palpitations, syncope.   GI  No heartburn, indigestion, abdominal pain, nausea, vomiting, diarrhea, change in bowel habits, loss of appetite, bloody stools.   Resp: No shortness of breath with exertion or at rest.  No excess mucus, no productive cough,  No non-productive cough,  No coughing up of blood.  No change in color of mucus.  No wheezing.  No chest wall deformity  Skin: no rash or lesions.  GU: no dysuria, change in color of urine, no urgency or frequency.  No flank pain, no hematuria   MS:  No joint pain or swelling.  No decreased range of motion.  No back pain.  Psych:  No change in mood or affect. No depression or anxiety.  No memory loss.   Vital Signs There were no vitals taken for this visit.   Physical Exam:  General- No distress,  A&Ox3 ENT: No sinus tenderness, TM clear, pale nasal mucosa, no oral exudate,no post nasal drip, no LAN Cardiac: S1, S2, regular rate and rhythm, no murmur Chest: No wheeze/ rales/ dullness; no accessory muscle use, no nasal flaring, no sternal retractions Abd.: Soft Non-tender Ext: No clubbing cyanosis, edema Neuro:  normal strength Skin: No rashes, warm and dry Psych: normal mood and behavior     Assessment/Plan  Sinusitis Avoid antibiotics if  possible See rescue plan below Add Mucinex prn Add Delsym prn Plan: Monitor and close follow up at any sign of recurrence Be proactive in seeking care with earliest symptoms    Allergic rhinitis Continue to treat>> prevent inflammation Seen by allergy 10/07/2017 Currently asymptomatic and doing well Plan: Follow up with Dr. Neldon Mc 12/ 2019 as is scheduled Continue current regimen >>Singulair, Flonase, Anoro            GERD (gastroesophageal reflux disease)  Started on aggressive reflux treatment per Dr. Neldon Mc States her symptoms and cough are much better with treatment Plan: Continue to treat and prevent reflux Continue omeprazole 40 mg once daily Continue zantac 300 mg  once daily Continue for 12 weeks per Dr. Neldon Mc Continue to decrease caffeine and alcohol consumption Follow up with Dr. Neldon Mc 12/ 2019 as is scheduled   COPD with chronic bronchitis and emphysema ([place]) PFT's essentially normal Will continue Anoro as patient feels she is getting benefit and if finally " better" with her current regimen. Consider therapeutic trial off Anoro after her 12 week treatment for GERD associated reflux  Need for vaccination for Strep pneumoniae Strep pneumoniae 23 Serotypes IgG low on 15 of the 23 phenotypes Plan: Pneumovax given 10/2017 and then repeat pneumo 23 testing 6 weeks after immunization ( 10/23 or after)  Flu vaccine >> preventative maintenence Plan: Flu vaccine today 11/19/2017   If needed use the following: OTC antihistamine, if that does not work then add Nasal saline, if that does not work, then add  Proventil HFA or similar 2 inhalations every 4-6 hours   [name], NP 11/19/2017  12:05 PM

## 2017-11-19 NOTE — Assessment & Plan Note (Signed)
Started on aggressive reflux treatment per Dr. Neldon Mc States her symptoms and cough are much better with treatment Plan: Continue to treat and prevent reflux Continue omeprazole 40 mg once daily Continue zantac 300 mg  once daily Continue for 12 weeks per Dr. Neldon Mc Continue to decrease caffeine and alcohol consumption Follow up with Dr. Neldon Mc 12/ 2019 as is scheduled

## 2017-11-19 NOTE — Assessment & Plan Note (Signed)
PFT's essentially normal Will continue Anoro as patient feels she is getting benefit and if finally " better" with her current regimen. Consider therapeutic trial off Anoro after her 12 week treatment for GERD associated reflux

## 2017-11-19 NOTE — Assessment & Plan Note (Signed)
Need for vaccination for Strep pneumoniae Strep pneumoniae 23 Serotypes IgG low on 15 of the 23 phenotypes Plan: Pneumovax given 10/2017 and then repeat pneumo 23 testing 6 weeks after immunization ( 10/23 or after)

## 2017-11-19 NOTE — Assessment & Plan Note (Signed)
Avoid antibiotics if possible See rescue plan below Add Mucinex prn Add Delsym prn Plan: Monitor and close follow up at any sign of recurrence Be proactive in seeking care with earliest symptoms

## 2017-11-19 NOTE — Assessment & Plan Note (Signed)
Continue to treat>> prevent inflammation Seen by allergy 10/07/2017 Currently asymptomatic and doing well Plan: Follow up with Dr. Neldon Mc 12/ 2019 as is scheduled Continue current regimen >>Singulair, Flonase, Anoro

## 2017-11-19 NOTE — Patient Instructions (Addendum)
It is good to see you today  Allergic rhinitis Continue to treat>> prevent inflammation Plan: Follow up with Dr. Neldon Mc 12/ 2019 as is scheduled Continue current regimen >>Singulair, Flonase, Anoro            GERD (gastroesophageal reflux disease) Plan: Continue to treat and prevent reflux Continue omeprazole 40 mg once daily Continue zantac 300 mg  once daily Continue for 12 weeks per Dr. Neldon Mc Continue to decrease caffeine and alcohol consumption Follow up with Dr. 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 Neldon Mc 10/1.  Present to his office for lab the lab work next week.   Flu vaccine >> preventative maintenence Plan: Will need Flu vaccine today 11/19/2017   If needed use the following: OTC antihistamine, if that does not work then add Nasal saline, if that does not work, then add  Proventil HFA or similar 2 inhalations every 4-6 hours  Follow up in 3 months with Caleigh Rabelo or Dr. Lake Bells Please contact office for sooner follow up if symptoms do not improve or worsen or seek emergency care  Remember your red flags and call for acute visit anytime needed.

## 2017-11-19 NOTE — Assessment & Plan Note (Signed)
Plan: Flu vaccine today 11/19/2017

## 2017-12-16 DIAGNOSIS — M25512 Pain in left shoulder: Secondary | ICD-10-CM | POA: Diagnosis not present

## 2018-01-06 DIAGNOSIS — Z79899 Other long term (current) drug therapy: Secondary | ICD-10-CM | POA: Diagnosis not present

## 2018-01-06 DIAGNOSIS — E038 Other specified hypothyroidism: Secondary | ICD-10-CM | POA: Diagnosis not present

## 2018-01-06 DIAGNOSIS — R82998 Other abnormal findings in urine: Secondary | ICD-10-CM | POA: Diagnosis not present

## 2018-01-06 DIAGNOSIS — D510 Vitamin B12 deficiency anemia due to intrinsic factor deficiency: Secondary | ICD-10-CM | POA: Diagnosis not present

## 2018-01-06 DIAGNOSIS — M810 Age-related osteoporosis without current pathological fracture: Secondary | ICD-10-CM | POA: Diagnosis not present

## 2018-01-06 DIAGNOSIS — B999 Unspecified infectious disease: Secondary | ICD-10-CM | POA: Diagnosis not present

## 2018-01-13 DIAGNOSIS — I700 Atherosclerosis of aorta: Secondary | ICD-10-CM | POA: Diagnosis not present

## 2018-01-13 DIAGNOSIS — Z0000 Encounter for general adult medical examination without abnormal findings: Secondary | ICD-10-CM | POA: Diagnosis not present

## 2018-01-13 DIAGNOSIS — Z1389 Encounter for screening for other disorder: Secondary | ICD-10-CM | POA: Diagnosis not present

## 2018-01-13 DIAGNOSIS — M810 Age-related osteoporosis without current pathological fracture: Secondary | ICD-10-CM | POA: Diagnosis not present

## 2018-01-13 DIAGNOSIS — K219 Gastro-esophageal reflux disease without esophagitis: Secondary | ICD-10-CM | POA: Diagnosis not present

## 2018-01-13 DIAGNOSIS — D472 Monoclonal gammopathy: Secondary | ICD-10-CM | POA: Diagnosis not present

## 2018-01-13 DIAGNOSIS — J449 Chronic obstructive pulmonary disease, unspecified: Secondary | ICD-10-CM | POA: Diagnosis not present

## 2018-01-13 DIAGNOSIS — I2510 Atherosclerotic heart disease of native coronary artery without angina pectoris: Secondary | ICD-10-CM | POA: Diagnosis not present

## 2018-01-13 DIAGNOSIS — Z6820 Body mass index (BMI) 20.0-20.9, adult: Secondary | ICD-10-CM | POA: Diagnosis not present

## 2018-01-13 DIAGNOSIS — E038 Other specified hypothyroidism: Secondary | ICD-10-CM | POA: Diagnosis not present

## 2018-01-13 DIAGNOSIS — E559 Vitamin D deficiency, unspecified: Secondary | ICD-10-CM | POA: Diagnosis not present

## 2018-01-13 DIAGNOSIS — D510 Vitamin B12 deficiency anemia due to intrinsic factor deficiency: Secondary | ICD-10-CM | POA: Diagnosis not present

## 2018-01-14 LAB — STREP PNEUMONIAE 23 SEROTYPES IGG
PNEUMO AB TYPE 12 (12F): 0.2 ug/mL — AB (ref >1.3)
PNEUMO AB TYPE 14: 1.4 ug/mL (ref >1.3)
PNEUMO AB TYPE 26 (6B): 0.2 ug/mL — AB (ref >1.3)
PNEUMO AB TYPE 34 (10A): 9 ug/mL (ref >1.3)
PNEUMO AB TYPE 4: 0.3 ug/mL — AB (ref >1.3)
PNEUMO AB TYPE 51 (7F): 0.7 ug/mL — AB (ref >1.3)
PNEUMO AB TYPE 57 (19A): 9.2 ug/mL (ref >1.3)
PNEUMO AB TYPE 5: 9.9 ug/mL (ref >1.3)
PNEUMO AB TYPE 8: 0.6 ug/mL — AB (ref >1.3)
Pneumo Ab Type 1*: 11.8 ug/mL (ref >1.3)
Pneumo Ab Type 17 (17F)*: 1.4 ug/mL (ref >1.3)
Pneumo Ab Type 19 (19F)*: 9.1 ug/mL (ref >1.3)
Pneumo Ab Type 2*: 0.8 ug/mL — ABNORMAL LOW (ref >1.3)
Pneumo Ab Type 20*: 2.2 ug/mL (ref >1.3)
Pneumo Ab Type 22 (22F)*: 0.3 ug/mL — ABNORMAL LOW (ref >1.3)
Pneumo Ab Type 23 (23F)*: 0.5 ug/mL — ABNORMAL LOW (ref >1.3)
Pneumo Ab Type 3*: 1.5 ug/mL (ref >1.3)
Pneumo Ab Type 43 (11A)*: 2.9 ug/mL (ref >1.3)
Pneumo Ab Type 54 (15B)*: 0.6 ug/mL — ABNORMAL LOW (ref >1.3)
Pneumo Ab Type 56 (18C)*: 2.5 ug/mL (ref >1.3)
Pneumo Ab Type 68 (9V)*: 0.5 ug/mL — ABNORMAL LOW (ref >1.3)
Pneumo Ab Type 70 (33F)*: 6.6 ug/mL (ref >1.3)
Pneumo Ab Type 9 (9N)*: 0.4 ug/mL — ABNORMAL LOW (ref >1.3)

## 2018-01-20 ENCOUNTER — Encounter: Payer: Self-pay | Admitting: Internal Medicine

## 2018-02-17 DIAGNOSIS — H02132 Senile ectropion of right lower eyelid: Secondary | ICD-10-CM | POA: Diagnosis not present

## 2018-02-17 DIAGNOSIS — H02135 Senile ectropion of left lower eyelid: Secondary | ICD-10-CM | POA: Diagnosis not present

## 2018-02-17 DIAGNOSIS — H02112 Cicatricial ectropion of right lower eyelid: Secondary | ICD-10-CM | POA: Diagnosis not present

## 2018-02-17 DIAGNOSIS — X58XXXA Exposure to other specified factors, initial encounter: Secondary | ICD-10-CM | POA: Diagnosis not present

## 2018-02-17 DIAGNOSIS — H02835 Dermatochalasis of left lower eyelid: Secondary | ICD-10-CM | POA: Diagnosis not present

## 2018-02-17 DIAGNOSIS — S01111A Laceration without foreign body of right eyelid and periocular area, initial encounter: Secondary | ICD-10-CM | POA: Diagnosis not present

## 2018-02-17 DIAGNOSIS — S01112A Laceration without foreign body of left eyelid and periocular area, initial encounter: Secondary | ICD-10-CM | POA: Diagnosis not present

## 2018-02-17 DIAGNOSIS — H02832 Dermatochalasis of right lower eyelid: Secondary | ICD-10-CM | POA: Diagnosis not present

## 2018-02-17 DIAGNOSIS — H02115 Cicatricial ectropion of left lower eyelid: Secondary | ICD-10-CM | POA: Diagnosis not present

## 2018-02-17 DIAGNOSIS — Y999 Unspecified external cause status: Secondary | ICD-10-CM | POA: Diagnosis not present

## 2018-02-20 ENCOUNTER — Ambulatory Visit: Payer: Medicare Other | Admitting: Internal Medicine

## 2018-03-03 ENCOUNTER — Encounter: Payer: Self-pay | Admitting: Allergy and Immunology

## 2018-03-03 ENCOUNTER — Ambulatory Visit (INDEPENDENT_AMBULATORY_CARE_PROVIDER_SITE_OTHER): Payer: Medicare Other | Admitting: Allergy and Immunology

## 2018-03-03 VITALS — BP 136/74 | HR 78 | Resp 18

## 2018-03-03 DIAGNOSIS — J3089 Other allergic rhinitis: Secondary | ICD-10-CM | POA: Diagnosis not present

## 2018-03-03 DIAGNOSIS — J449 Chronic obstructive pulmonary disease, unspecified: Secondary | ICD-10-CM | POA: Diagnosis not present

## 2018-03-03 DIAGNOSIS — K219 Gastro-esophageal reflux disease without esophagitis: Secondary | ICD-10-CM | POA: Diagnosis not present

## 2018-03-03 DIAGNOSIS — B999 Unspecified infectious disease: Secondary | ICD-10-CM

## 2018-03-03 NOTE — Patient Instructions (Addendum)
  1.  Continue to Treat and prevent inflammation:   A.  Flonase 1-2 sprays each nostril 1 time per day  B.  montelukast 10 mg tablet 1 time per day  C.  Consider discontinuing Anoro  2.  Continue to Treat and prevent reflux:   A.  Continue to consolidate caffeine and alcohol consumption  B.  Omeprazole 40 mg tablet in a.m.  C.  Ranitidine 300 mg tablet in p.m.  3.  If needed:   A.  OTC antihistamine  B.  Nasal saline  C.  Proventil HFA or similar 2 inhalations every 4-6 hours  4. Return to clinic in 3 months. Taper medications?

## 2018-03-03 NOTE — Progress Notes (Signed)
Follow-up Note  Referring Provider: Marton Redwood, MD Primary Provider: Marton Redwood, MD Date of Office Visit: 03/03/2018  Subjective:   Pamela Larson (DOB: Jul 08, 1945) is a 73 y.o. female who returns to the [facility] on 03/03/2018 in re-evaluation of the following:  HPI: Melana returns to this clinic in reevaluation of her respiratory tract issue addressed during her initial evaluation 07 October 2017 which included LPR, emphysema from prolonged tobacco use, rhinitis, and recurrent infections.  Her last visit to this clinic was 11 November 2017 at which point in time she was doing very well regarding all of her respiratory tract issues.  She has continued to do very well and in fact feels wonderful.  She has very little respiratory tract symptoms.  Her chronic coughing and throat clearing and all the issues with recurrent sore throats and head congestion are nonexistent.  She occasionally has some very slight sputum production.  She has not used a short acting bronchodilator since Thanksgiving 2019  She does not have any classic reflux symptoms.  She does drink 1 coffee in the morning and has 2 or 3 ounces of scotch at nighttime.  She did have the flu vaccine and she had the Pneumovax.  Allergies as of 03/03/2018      Reactions   Amoxicillin Nausea And Vomiting   Has patient had a PCN reaction causing immediate rash, facial/tongue/throat swelling, SOB or lightheadedness with hypotension: #  #  #  YES  #  #  #  Has patient had a PCN reaction causing severe rash involving mucus membranes or skin necrosis: No Has patient had a PCN reaction that required hospitalization No Has patient had a PCN reaction occurring within the last 10 years: #  #  #  YES  #  #  #  If all of the above answers are "NO", then may proceed with Cephalosporin use.   Augmentin [amoxicillin-pot Clavulanate] Nausea And Vomiting   Demerol [meperidine Hcl] Nausea And Vomiting   Sulfa Antibiotics  Nausea And Vomiting      Medication List      acetaminophen 500 MG tablet Commonly known as:  TYLENOL Take 1,000 mg by mouth every 6 (six) hours as needed for mild pain. For pain   albuterol 108 (90 Base) MCG/ACT inhaler Commonly known as:  PROVENTIL HFA;VENTOLIN HFA Inhale 2 puffs into the lungs every 6 (six) hours as needed for wheezing or shortness of breath. For shortness of breath   ALPRAZolam 0.25 MG tablet Commonly known as:  XANAX Take 0.125 mg by mouth daily as needed for anxiety. Pt takes 1/2 tablet as needed   calcium carbonate 1250 (500 Ca) MG tablet Commonly known as:  OS-CAL - dosed in mg of elemental calcium Take 1 tablet by mouth daily.   FLUoxetine 20 MG tablet Commonly known as:  PROZAC Take 20 mg by mouth 2 (two) times daily.   fluticasone 50 MCG/ACT nasal spray Commonly known as:  FLONASE Place 1 spray into both nostrils daily as needed for allergies or rhinitis.   levothyroxine 100 MCG tablet Commonly known as:  SYNTHROID, LEVOTHROID Take 50-100 mcg by mouth See admin instructions. TAKES 100 MCG DAILY EXCEPT ON THURSDAYS AND TAKES 50 MCG   Melatonin 3 MG Caps Take 3 mg by mouth at bedtime as needed (SLEEP).   montelukast 10 MG tablet Commonly known as:  SINGULAIR Take 1 tablet (10 mg total) by mouth at bedtime.   naproxen sodium 220 MG  tablet Commonly known as:  ALEVE Take 440 mg by mouth 2 (two) times daily as needed (PAIN).   omeprazole 40 MG capsule Commonly known as:  PRILOSEC Take 1 capsule (40 mg total) by mouth daily.   ranitidine 300 MG tablet Commonly known as:  ZANTAC Take 1 tablet (300 mg total) by mouth at bedtime.   umeclidinium-vilanterol 62.5-25 MCG/INH Aepb Commonly known as:  ANORO ELLIPTA Inhale 1 puff into the lungs daily.   Vitamin D (Ergocalciferol) 1.25 MG (50000 UT) Caps capsule Commonly known as:  DRISDOL Take 50,000 Units by mouth every 7 (seven) days. On Mondays       Past Medical History:  Diagnosis Date    . Anemia    pernicious anemia - 1991   . Anxiety   . Arthritis    rheumatoid arthritis   . Chronic fatigue   . Cystitis, interstitial   . Depression   . Disc disorder of lumbar region 2013  . Fibromyalgia   . GERD (gastroesophageal reflux disease)   . History of kidney stones   . Hypothyroidism   . Lyme disease    hx of possible   . 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 Osteoporosis   . Pneumonia    hx of   . Rheumatoid arthritis [facility])     Past Surgical History:  Procedure Laterality Date  . APPENDECTOMY  1978  . bladder hydrodistention  1998  . BREAST SURGERY     reduction   . CERVICAL DISC ARTHROPLASTY N/A 04/03/2016   Procedure: CERVICAL SIX CERVICAL SEVEN [place] ARTHROPLASTY;  Surgeon: Jovita Gamma, MD;  Location: [facility];  Service: Neurosurgery;  Laterality: N/A;  left side approach  . COSMETIC SURGERY  2005   face lift  . EXCISION MORTON'S NEUROMA Left 05/08/2017   Procedure: EXCISION MORTON'S NEUROMA Left 3rd webspace;  Surgeon: [name], MD;  Location: [facility];  Service: Orthopedics;  Laterality: Left;  . EYE SURGERY    . LUMBAR LAMINECTOMY/DECOMPRESSION MICRODISCECTOMY  06/10/2011   Procedure: LUMBAR LAMINECTOMY/DECOMPRESSION MICRODISCECTOMY 1 LEVEL;  Surgeon: Hosie Spangle, MD;  Location: [facility];  Service: Neurosurgery;  Laterality: Left;  Lumbar Five Sacral One Lumbar Laminectomy Decompression Microdiscectomy     Review of systems negative except as noted in HPI / PMHx or noted below:  Review of Systems  Constitutional: Negative.   HENT: Negative.   Eyes: Negative.   Respiratory: Negative.   Cardiovascular: Negative.   Gastrointestinal: Negative.   Genitourinary: Negative.   Musculoskeletal: Negative.   Skin: Negative.   Neurological: Negative.   Endo/Heme/Allergies: Negative.   Psychiatric/Behavioral: Negative.      Objective:   Vitals:   03/03/18 1758  BP: 136/74   Pulse: 78  Resp: 18  SpO2: 98%          Physical Exam Constitutional:      Appearance: She is not diaphoretic.  HENT:     Head: Normocephalic.     Right Ear: Tympanic membrane, ear canal and external ear normal.     Left Ear: Tympanic membrane, ear canal and external ear normal.     Nose: Nose normal. No mucosal edema or rhinorrhea.     Mouth/Throat:     Pharynx: Uvula midline. No oropharyngeal exudate.  Eyes:     Conjunctiva/sclera: Conjunctivae normal.  Neck:     Thyroid: No thyromegaly.     Trachea: Trachea normal. No tracheal tenderness or  tracheal deviation.  Cardiovascular:     Rate and Rhythm: Normal rate and regular rhythm.     Heart sounds: Normal heart sounds, S1 normal and S2 normal. No murmur.  Pulmonary:     Effort: No respiratory distress.     Breath sounds: Normal breath sounds. No stridor. No wheezing or rales.  Lymphadenopathy:     Head:     Right side of head: No tonsillar adenopathy.     Left side of head: No tonsillar adenopathy.     Cervical: No cervical adenopathy.  Skin:    Findings: No erythema or rash.     Nails: There is no clubbing.   Neurological:     Mental Status: She is alert.     Diagnostics:    Spirometry was performed and demonstrated an FEV1 of 1.71 at 86 % of predicted.  Results of blood tests obtained 06 January 2018 after receiving Pneumovax vaccination identified increase in antibody titers directed against multiple serotypes of pneumococcus.  Her elevation in titers were somewhere around 2 fold at greatest for the majority of these elevations.  Assessment and Plan:   1. LPRD (laryngopharyngeal reflux disease)   2. Recurrent infections   3. Perennial allergic rhinitis   4. COPD with chronic bronchitis and emphysema ([place])     1.  Continue to Treat and prevent inflammation:   A.  Flonase 1-2 sprays each nostril 1 time per day  B.  montelukast 10 mg tablet 1 time per day  C.  Consider discontinuing Anoro  2.   Continue to Treat and prevent reflux:   A.  Continue to consolidate caffeine and alcohol consumption  B.  Omeprazole 40 mg tablet in a.m.  C.  Ranitidine 300 mg tablet in p.m.  3.  If needed:   A.  OTC antihistamine  B.  Nasal saline  C.  Proventil HFA or similar 2 inhalations every 4-6 hours  4. Return to clinic in 3 months. Taper medications?  Taylinn appears to be doing so much better on her current therapy and I think that there may be an opportunity to consolidate her treatment and I have asked her to discuss with her pulmonologist about discontinuing Anoro at this point.  I would like for her to remain on an additional 3 months of therapy directed against reflux and inflammation using Flonase and montelukast.  If she still continues to do well at the end of 3 months we will attempt to taper down some of her medications.    Allena Katz, MD Allergy / Immunology [place]

## 2018-03-04 ENCOUNTER — Other Ambulatory Visit: Payer: Self-pay | Admitting: Allergy and Immunology

## 2018-03-04 ENCOUNTER — Encounter: Payer: Self-pay | Admitting: Allergy and Immunology

## 2018-03-23 ENCOUNTER — Encounter: Payer: Self-pay | Admitting: Acute Care

## 2018-03-23 ENCOUNTER — Ambulatory Visit (INDEPENDENT_AMBULATORY_CARE_PROVIDER_SITE_OTHER): Payer: Medicare Other | Admitting: Acute Care

## 2018-03-23 DIAGNOSIS — Z0000 Encounter for general adult medical examination without abnormal findings: Secondary | ICD-10-CM

## 2018-03-23 DIAGNOSIS — K219 Gastro-esophageal reflux disease without esophagitis: Secondary | ICD-10-CM | POA: Diagnosis not present

## 2018-03-23 DIAGNOSIS — J309 Allergic rhinitis, unspecified: Secondary | ICD-10-CM | POA: Diagnosis not present

## 2018-03-23 MED ORDER — UMECLIDINIUM-VILANTEROL 62.5-25 MCG/INH IN AEPB: 1.0000 | INHALATION_SPRAY | Freq: Every day | RESPIRATORY_TRACT | 0 refills | Status: DC

## 2018-03-23 NOTE — Assessment & Plan Note (Addendum)
Pneumococcal antibody titers improved after Prevnar 13 booster 12/2017 Flu up to date Plan Flu vaccine Fall 2020 Consider Pneumococcal  Antibody titer 12/2018

## 2018-03-23 NOTE — Assessment & Plan Note (Signed)
Well controlled on maintenance therapy Plan: Continue to Treat and prevent reflux: Plan Continue  to consolidate caffeine and alcohol consumption Continue Omeprazole 40 mg tablet in a.m. Continue Ranitidine 300 mg tablet in p.m. Consider decreasing dosage  in 3-6 months

## 2018-03-23 NOTE — Patient Instructions (Addendum)
   It is great to see you today. We will continue the following therapies.  Allergic Rhinitis Continue to Treat and prevent inflammation: Plan: Continue Flonase 1-2 sprays each nostril 1 time per day Continue montelukast 10 mg tablet 1 time per day We will do a trial off Anoro to consolidate therapies Do not hesitate to restart therapy if symptoms return. We will give you a sample of Anoro to use if you need it. If you do resume therapy, call the office and let us know so we can call you in a prescription.               GERD Continue to Treat and prevent reflux: Plan Continue  to consolidate caffeine and alcohol consumption Continue Omeprazole 40 mg tablet in a.m. Continue Ranitidine 300 mg tablet in p.m.  If needed: Add for flares Plan OTC antihistamine Nasal saline Proventil HFA or similar 2 inhalations every 4-6 hours  Maintenance  Plan: Flu vaccine in Fall 2020 Consider Pneumococcal titers 12/2018  Follow up appointment in 6 months or as needed with Judson Roch NP   Call for an appointment  with increased need/ use of  rescue inhaler or medications for flares You can call for an appointment any time you feel you need [place].

## 2018-03-23 NOTE — Assessment & Plan Note (Addendum)
  Allergic Rhinitis Controlled with maintenance therapy Continue to Treat and prevent inflammation: Plan: Continue Flonase 1-2 sprays each nostril 1 time per day Continue montelukast 10 mg tablet 1 time per day We will do a trial off Anoro to consolidate We will give you a sample to use in the event you fail trial off medication  Do not hesitate to restart therapy if symptoms return. Call the office and let us know so we can call in a prescription  If needed: Add for flares Plan OTC antihistamine Nasal saline Proventil HFA or similar 2 inhalations every 4-6 hours   Call for an appointment  with increased need/ use of  rescue inhaler or medications for flares

## 2018-03-23 NOTE — Progress Notes (Signed)
History of Present Illness Pamela Larson is a 73 y.o. female former smoker ( quit 1992 with a 40 pack year smoking history) with recurrent infections, and bronchitis. She is followed by Dr. Lake Bells  Synopsis: Referred inJuly 2083for recurrent bronchitis episodes ever since August 2015.;smoked 2 packs of cigarettes daily for 20 years, quit in 1992 with a 40 pack year smoking history.  Recent [facility] Encounters:  08/20/2017- initial office visit-[facility]  patient reporting back in 2015 she had a serious lung infection. Since then has been to the doctor about every 6 months where she gets sick. Patient reports she had Lyme disease diagnosed back in 1991. She says she had bronchitis-like kids do when she was a child typically about every other year. Patient with a 40-pack-year smoking history. Chest x-ray from August 2015 showed possible emphysema and airway thickening in right lower lobe. Plan: High-res CT, pulmonary function test, sputum cultures, follow-up in 2 weeks  09/03/2017- office visit-SG Patient reviewed high-res CT, spirometry as well as sputum cultures today. Patient with nasal congestion. CT high-res was negative for bronchiectasis but did show some emphysema. Plan: Referral to allergy (Dr. Neldon Mc), therapeutic trial of Anoro Ellipta, scheduled for PFTs, follow-up in 2 months with Dr. Lake Bells, continue Zyrtec  09/10/17- OV - SG Early sinusitis, treated with Z-Pak, continue Anoro  09/24/2017-office visit-BW Patient doing better  09/25/2017>>Acute OV: BM- suspected viral URI as + sick contact with grandchild Allergic rhinitis>> adding Nettie Pott rinses and Chlorphenhydramine for allergy symptoms to her regimen Sinusitis was improving Keep appointment with Dr. Neldon Mc Keep PFT appointment  10/07/2017 OV Dr. Neldon Mc Pt.appears to have some respiratory tract inflammation and irritation but doubtthat atopic disease is driving this issue.History very  consistent with reflux induced respiratory disease and we will get her to aggressively treat reflux at this point and she does have a prior history of tobacco use which probably changedthe architecture of her lung to some degree. She does have some evidence of airway obstruction and air trapping and she willcontinueuse anti-inflammatory agentsas she has been doing.Positivehistory of recurrent renal infections and to be complete we will make sure that she has an intact B-cell immune system with the blood tests noted above. She has follow up 11/11/2017 to evaluate her response to treatment. >> Dr. Neldon Mc   10/16/2017>> Acute OV - BM- for acute left frontal maxillary sinusitis Asked to wait 3 days to see if symptoms clear. Prescribed Azithro in the event this does not clear in 3-5 days.  10/23/2017 Follow up>> SG-  Did not need z pack for sinusitis,She was seen by Dr. Neldon Mc. Allergy testing was not significant for anything. 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 She has been started on Singulair. Her strep pneumonia antibodies are low and she got  Prevnar 10/22/2017. Will need repeat pneumo 24 testing 6 weeks after immunization. (10/23 or after)    03/23/2018  3 month follow up: Pt returns for follow up. She states she has been well. She was recently seen by Dr. Neldon Mc 02/2018, who recommended continued aggressive treatment of her allergic rhinitis with Flonase, Singulair, and he suggested weaning patient from [facility] as she  was doing so much better,  Consolidating her  medications as able . Additionally continued aggressive management of her reflux with Omeprazole, Zantac and limiting alcohol and caffeine consumption. She has  had pneumococcal vaccine given  06 January 2018 after receiving Pneumovax vaccination  identified increase in antibody titers directed against multiple serotypes of pneumococcus.  Her elevation in titers were somewhere around 2 fold at greatest for the majority of these elevations.  Pt. 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  She states her chronic cough and throat clearing  is much better, almost totally gone . She is agreeable to the a trial off the Anoro . We will give her a sample of Anoro to use in the event she fails :she will have a back up for treatment.She denies fever, chest pain, orthopnea of hemoptysis.     Test Results: 08/20/2017-spirometry-normal spirometry 08/22/2017- sputum culture, AFB, fungal all negative 08/27/2017-CT chest high-res- no ILD, mild diffuse bronchial wall thickening with very mild paraseptal emphysema Lab Results: 10/07/2017 IgE>> 71 IU/ml IgA>> 85 mg/dl ( Low) IgM>> 48 mg/dl IgG>> 945 mg/dl  12/2017 Anti pneumococcal ab>> Results for Pamela Larson, Pamela Larson (MRN [id number]) as of 03/23/2018 10:51  Ref. Range 01/06/2018 14:11  Pneumo Ab Type 1* Latest Ref Range: >1.3 ug/mL 11.8  Pneumo Ab Type 12 (38F)* Latest Ref Range: >1.3 ug/mL 0.2 (L)  Pneumo Ab Type 14* Latest Ref Range: >1.3 ug/mL 1.4  Pneumo Ab Type 17 (6F)* Latest Ref Range: >1.3 ug/mL 1.4  Pneumo Ab Type 19 (70F)* Latest Ref Range: >1.3 ug/mL 9.1  Pneumo Ab Type 2* Latest Ref Range: >1.3 ug/mL 0.8 (L)  Pneumo Ab Type 20* Latest Ref Range: >1.3 ug/mL 2.2  Pneumo Ab Type 22 (25F)* Latest Ref Range: >1.3 ug/mL 0.3 (L)  Pneumo Ab Type 23 (14F)* Latest Ref Range: >1.3 ug/mL 0.5 (L)  Pneumo Ab Type 26 (6B)* Latest Ref Range: >1.3 ug/mL 0.2 (L)  Pneumo Ab Type 3* Latest Ref Range: >1.3 ug/mL 1.5  Pneumo Ab Type 34 (10A)* Latest Ref Range: >1.3 ug/mL 9.0  Pneumo Ab Type 4* Latest Ref Range: >1.3 ug/mL 0.3 (L)  Pneumo Ab Type 43 (11A)* Latest Ref Range: >1.3 ug/mL 2.9  Pneumo Ab Type 5* Latest Ref Range: >1.3  ug/mL 9.9  Pneumo Ab Type 51 (69F)* Latest Ref Range: >1.3 ug/mL 0.7 (L)  Pneumo Ab Type 54 (15B)* Latest Ref Range: >1.3 ug/mL 0.6 (L)  Pneumo Ab Type 56 (18C)* Latest Ref Range: >1.3 ug/mL 2.5  Pneumo Ab Type 57 (19A)* Latest Ref Range: >1.3 ug/mL 9.2  Pneumo Ab Type 68 (9V)* Latest Ref Range: >1.3 ug/mL 0.5 (L)  Pneumo Ab Type 70 (85F)* Latest Ref Range: >1.3 ug/mL 6.6  Pneumo Ab Type 8* Latest Ref Range: >1.3 ug/mL 0.6 (L)  Pneumo Ab Type 9 (9N)* Latest Ref Range: >1.3 ug/mL 0.4 (L)         Anti pneumococcal ab>> Pneumo Ab Type 1* >1.3 ug/mL 1.9   Pneumo Ab Type 3* >1.3 ug/mL 0.7Low   Pneumo Ab Type 4* >1.3 ug/mL <0.1Low   Pneumo Ab Type 8* >1.3 ug/mL 0.3Low   Pneumo Ab Type 9 (9N)* >1.3 ug/mL 0.3Low   Pneumo Ab Type 12 (38F)* >1.3 ug/mL <0.1Low   Pneumo Ab Type 14* >1.3 ug/mL 0.9Low   Pneumo Ab Type 17 (6F)* >1.3 ug/mL 1.0Low   Pneumo Ab Type 19 (70F)* >1.3 ug/mL 2.7   Pneumo Ab Type 2* >1.3 ug/mL 0.7Low   Pneumo Ab Type 20* >1.3 ug/mL 0.8Low   Pneumo Ab Type 22 (25F)* >1.3 ug/mL 0.1Low   Pneumo Ab Type 23 (14F)* >  1.3 ug/mL 0.2Low   Pneumo Ab Type 26 (6B)* >1.3 ug/mL <0.1Low   Pneumo Ab Type 34 (10A)* >1.3 ug/mL 1.7   Pneumo Ab Type 43 (11A)* >1.3 ug/mL 1.4   Pneumo Ab Type 5* >1.3 ug/mL 5.4   Pneumo Ab Type 51 (51F)* >1.3 ug/mL 0.4Low   Pneumo Ab Type 54 (15B)* >1.3 ug/mL 0.1Low   Pneumo Ab Type 56 (18C)* >1.3 ug/mL 2.3   Pneumo Ab Type 57 (19A)* >1.3 ug/mL 3.3   Pneumo Ab Type 68 (9V)* >1.3 ug/mL 0.2Low   Pneumo Ab Type 70 (81F)* >1.3 ug       CBC Latest Ref Rng & Units 10/07/2017 03/27/2016 11/06/2014  WBC 3.4 - 10.8 x10E3/uL 7.9 6.1 8.5  Hemoglobin 11.1 - 15.9 g/dL 14.8 14.0 13.7  Hematocrit 34.0 - 46.6 % 43.1 42.2 41.1  Platelets 150 - 450 x10E3/uL 326 328 305    BMP Latest Ref Rng & Units 03/27/2016 11/06/2014 11/02/2013  Glucose 65 - 99 mg/dL 86 87 115(H)  BUN 6 - 20 mg/dL 10 10 10   Creatinine 0.44 - 1.00 mg/dL 0.65 0.77 0.69  Sodium 135 -  145 mmol/L 139 139 139  Potassium 3.5 - 5.1 mmol/L 4.3 4.1 3.4(L)  Chloride 101 - 111 mmol/L 101 99(L) 99  CO2 22 - 32 mmol/L 25 27 22   Calcium 8.9 - 10.3 mg/dL 9.8 9.7 9.1    BNP No results found for: BNP  ProBNP No results found for: PROBNP  PFT    Component Value Date/Time   FEV1PRE 2.08 10/16/2017 1355   FEV1POST 2.10 10/16/2017 1355   FVCPRE 2.60 10/16/2017 1355   FVCPOST 2.55 10/16/2017 1355   TLC 4.43 10/16/2017 1355   DLCOUNC 15.92 10/16/2017 1355   PREFEV1FVCRT 80 10/16/2017 1355   PSTFEV1FVCRT 82 10/16/2017 1355    No results found.   Past medical hx Past Medical History:  Diagnosis Date  . Anemia    pernicious anemia - 1991   . Anxiety   . Arthritis    rheumatoid arthritis   . Chronic fatigue   . Cystitis, interstitial   . Depression   . Disc disorder of lumbar region 2013  . Fibromyalgia   . GERD (gastroesophageal reflux disease)   . History of kidney stones   . Hypothyroidism   . Lyme disease    hx of possible   . 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 Osteoporosis   . Pneumonia    hx of   . Rheumatoid arthritis ([place])      Social History   Tobacco Use  . Smoking status: Former Smoker    Packs/day: 2.00    Years: 20.00    Pack years: 40.00    Types: Cigarettes    Last attempt to quit: 02/11/1990    Years since quitting: 28.1  . Smokeless tobacco: Never Used  Substance Use Topics  . Alcohol use: Yes    Alcohol/week: 14.0 standard drinks    Types: 14 Standard drinks or equivalent per week    Comment: drink per nite scotch  . Drug use: No    Ms.Six reports that she quit smoking about 28 years ago. Her smoking use included cigarettes. She has a 40.00 pack-year smoking history. She has never used smokeless tobacco. She reports current alcohol use of about 14.0 standard drinks of alcohol per week. She reports that she does not use drugs.  Tobacco Cessation: Former smoker, quit 1992  with a 40 pack year smoking history  Past surgical hx, Family hx, Social hx all reviewed.  Current Outpatient Medications on File Prior to Visit  Medication Sig  . acetaminophen (TYLENOL) 500 MG tablet Take 1,000 mg by mouth every 6 (six) hours as needed for mild pain. For pain  . albuterol (PROVENTIL HFA;VENTOLIN HFA) 108 (90 Base) MCG/ACT inhaler Inhale 2 puffs into the lungs every 6 (six) hours as needed for wheezing or shortness of breath. For shortness of breath  . ALPRAZolam (XANAX) 0.25 MG tablet Take 0.125 mg by mouth daily as needed for anxiety. Pt takes 1/2 tablet as needed   . calcium carbonate (OS-CAL - DOSED IN MG OF ELEMENTAL CALCIUM) 1250 MG tablet Take 1 tablet by mouth daily.  Marland Kitchen FLUoxetine (PROZAC) 20 MG tablet Take 20 mg by mouth 2 (two) times daily.  . fluticasone (FLONASE) 50 MCG/ACT nasal spray Place 1 spray into both nostrils daily as needed for allergies or rhinitis.  Marland Kitchen levothyroxine (SYNTHROID, LEVOTHROID) 100 MCG tablet Take 50-100 mcg by mouth See admin instructions. TAKES 100 MCG DAILY EXCEPT ON THURSDAYS AND TAKES 50 MCG  . Melatonin 3 MG CAPS Take 3 mg by mouth at bedtime as needed (SLEEP).  . montelukast (SINGULAIR) 10 MG tablet Take 1 tablet (10 mg total) by mouth at bedtime.  . naproxen sodium (ANAPROX) 220 MG tablet Take 440 mg by mouth 2 (two) times daily as needed (PAIN).  Marland Kitchen omeprazole (PRILOSEC) 40 MG capsule Take 1 capsule (40 mg total) by mouth daily.  . ranitidine (ZANTAC) 300 MG tablet TAKE 1 TABLET(300 MG) BY MOUTH AT BEDTIME  . umeclidinium-vilanterol (ANORO ELLIPTA) 62.5-25 MCG/INH AEPB Inhale 1 puff into the lungs daily.  . Vitamin D, Ergocalciferol, (DRISDOL) 50000 UNITS CAPS Take 50,000 Units by mouth every 7 (seven) days. On Mondays   No current facility-administered medications on file prior to visit.      Allergies  Allergen Reactions  . Amoxicillin Nausea And Vomiting     Has patient had a PCN reaction causing immediate rash,  facial/tongue/throat swelling, SOB or lightheadedness with hypotension: #  #  #  YES  #  #  #  Has patient had a PCN reaction causing severe rash involving mucus membranes or skin necrosis: No Has patient had a PCN reaction that required hospitalization No Has patient had a PCN reaction occurring within the last 10 years: #  #  #  YES  #  #  #  If all of the above answers are "NO", then may proceed with Cephalosporin use.  . Augmentin [Amoxicillin-Pot Clavulanate] Nausea And Vomiting  . Demerol [Meperidine Hcl] Nausea And Vomiting  . Sulfa Antibiotics Nausea And Vomiting    Review Of Systems:  Constitutional:   No  weight loss, night sweats,  Fevers, chills, fatigue, or  lassitude.  HEENT:   No headaches,  Difficulty swallowing,  Tooth/dental problems, or  Sore throat,                No sneezing, itching, ear ache, nasal congestion, post nasal drip,   CV:  No chest pain,  Orthopnea, PND, swelling in lower extremities, anasarca, dizziness, palpitations, syncope.   GI  No heartburn, indigestion, abdominal pain, nausea, vomiting, diarrhea, change in bowel habits, loss of appetite, bloody stools.   Resp: No shortness of breath with exertion or at rest.  No excess mucus, no productive cough,  No non-productive cough,  No coughing up of blood.  No change in color  of mucus.  No wheezing.  No chest wall deformity  Skin: no rash or lesions.  GU: no dysuria, change in color of urine, no urgency or frequency.  No flank pain, no hematuria   MS:  No joint pain or swelling.  No decreased range of motion.  No back pain.  Psych:  No change in mood or affect. No depression or anxiety.  No memory loss.   Vital Signs BP 104/60 (BP Location: Right Arm, Patient Position: Sitting, Cuff Size: Normal)   Pulse 78   Ht 5' (1.524 m)   Wt 107 lb 12.8 oz (48.9 kg)   SpO2 97%   BMI 21.05 kg/m    Physical Exam:  General- No distress,  A&Ox3, pleasant ENT: No sinus tenderness, TM clear, pale nasal  mucosa, no oral exudate,no post nasal drip, no LAN Cardiac: S1, S2, regular rate and rhythm, no murmur Chest: No wheeze/ rales/ dullness; no accessory muscle use, no nasal flaring, no sternal retractions Abd.: Soft Non-tender, ND, BS +, Body mass index is 21.05 kg/m. Ext: No clubbing cyanosis, edema Neuro:  normal strength, MAE x 4, A&O x 3, appropriate Skin: No rashes, warm and dry Psych: normal mood and behavior   Assessment/Plan  Allergic rhinitis  Allergic Rhinitis Controlled with maintenance therapy Continue to Treat and prevent inflammation: Plan: Continue Flonase 1-2 sprays each nostril 1 time per day Continue montelukast 10 mg tablet 1 time per day We will do a trial off Anoro to consolidate We will give you a sample to use in the event you fail trial off medication  Do not hesitate to restart therapy if symptoms return. Call the office and let us know so we can call in a prescription  If needed: Add for flares Plan OTC antihistamine Nasal saline Proventil HFA or similar 2 inhalations every 4-6 hours   Call for an appointment  with increased need/ use of  rescue inhaler or medications for flares   GERD (gastroesophageal reflux disease) Well controlled on maintenance therapy Plan: Continue to Treat and prevent reflux: Plan Continue  to consolidate caffeine and alcohol consumption Continue Omeprazole 40 mg tablet in a.m. Continue Ranitidine 300 mg tablet in p.m. Consider decreasing dosage  in 3-6 months    Healthcare maintenance Pneumococcal antibody titers improved after Prevnar 13 booster 12/2017 Flu up to date Plan Flu vaccine Fall 2020 Consider Pneumococcal  Antibody titer 12/2018  Follow up with Judson Roch NP in 6 months or before as needed             Please contact office for sooner follow up if symptoms do not improve or worsen or seek emergency care     [name], NP 03/23/2018  11:46 AM

## 2018-03-30 ENCOUNTER — Other Ambulatory Visit: Payer: Self-pay | Admitting: Allergy and Immunology

## 2018-03-30 DIAGNOSIS — M25512 Pain in left shoulder: Secondary | ICD-10-CM | POA: Diagnosis not present

## 2018-03-30 NOTE — Progress Notes (Signed)
Reviewed, agree 

## 2018-04-01 ENCOUNTER — Encounter: Payer: Self-pay | Admitting: Acute Care

## 2018-04-01 ENCOUNTER — Ambulatory Visit (INDEPENDENT_AMBULATORY_CARE_PROVIDER_SITE_OTHER): Payer: Medicare Other | Admitting: Acute Care

## 2018-04-01 ENCOUNTER — Telehealth: Payer: Self-pay | Admitting: Acute Care

## 2018-04-01 VITALS — BP 160/82 | HR 83 | Temp 98.2°F | Ht 60.0 in | Wt 109.0 lb

## 2018-04-01 DIAGNOSIS — K219 Gastro-esophageal reflux disease without esophagitis: Secondary | ICD-10-CM

## 2018-04-01 DIAGNOSIS — R05 Cough: Secondary | ICD-10-CM | POA: Diagnosis not present

## 2018-04-01 DIAGNOSIS — J309 Allergic rhinitis, unspecified: Secondary | ICD-10-CM | POA: Diagnosis not present

## 2018-04-01 DIAGNOSIS — J0110 Acute frontal sinusitis, unspecified: Secondary | ICD-10-CM | POA: Diagnosis not present

## 2018-04-01 DIAGNOSIS — R059 Cough, unspecified: Secondary | ICD-10-CM

## 2018-04-01 LAB — POCT INFLUENZA A/B
INFLUENZA B, POC: NEGATIVE
Influenza A, POC: NEGATIVE

## 2018-04-01 MED ORDER — AZITHROMYCIN 250 MG PO TABS: ORAL_TABLET | ORAL | 0 refills | Status: DC

## 2018-04-01 MED ORDER — HYDROCODONE-HOMATROPINE 5-1.5 MG/5ML PO SYRP: 5.0000 mL | ORAL_SOLUTION | Freq: Every evening | ORAL | 0 refills | Status: DC | PRN

## 2018-04-01 NOTE — Progress Notes (Signed)
History of Present Illness Pamela Larson is a 73 y.o. female former smoker ( quit 1992 with a 40 pack year smoking history) with recurrent infections, and bronchitis. She is followed by Dr. Lake Bells.  Synopsis: Referred inJuly 204for recurrent bronchitis episodes ever since August 2015.;smoked 2 packs of cigarettes daily for 20 years, quit in 1992witha 40 pack year smoking history.   04/01/2018  Acute OV: Pt. Presents with + low grade fever, cough,body aches and fatigue with sore throat and nasal drainage which started Monday after exposure to sick grandchild. She had stopped her Anoro 2 weeks ago to see how she would tolerate being off the inhaler.She is using tylenol and Mucinex for symptom relief.She denies fever, chest pain, orthopnea or hemoptysis.  Test Results:  CBC Latest Ref Rng & Units 10/07/2017 03/27/2016 11/06/2014  WBC 3.4 - 10.8 x10E3/uL 7.9 6.1 8.5  Hemoglobin 11.1 - 15.9 g/dL 14.8 14.0 13.7  Hematocrit 34.0 - 46.6 % 43.1 42.2 41.1  Platelets 150 - 450 x10E3/uL 326 328 305    BMP Latest Ref Rng & Units 03/27/2016 11/06/2014 11/02/2013  Glucose 65 - 99 mg/dL 86 87 115(H)  BUN 6 - 20 mg/dL 10 10 10   Creatinine 0.44 - 1.00 mg/dL 0.65 0.77 0.69  Sodium 135 - 145 mmol/L 139 139 139  Potassium 3.5 - 5.1 mmol/L 4.3 4.1 3.4(L)  Chloride 101 - 111 mmol/L 101 99(L) 99  CO2 22 - 32 mmol/L 25 27 22   Calcium 8.9 - 10.3 mg/dL 9.8 9.7 9.1    BNP No results found for: BNP  ProBNP No results found for: PROBNP  PFT    Component Value Date/Time   FEV1PRE 2.08 10/16/2017 1355   FEV1POST 2.10 10/16/2017 1355   FVCPRE 2.60 10/16/2017 1355   FVCPOST 2.55 10/16/2017 1355   TLC 4.43 10/16/2017 1355   DLCOUNC 15.92 10/16/2017 1355   PREFEV1FVCRT 80 10/16/2017 1355   PSTFEV1FVCRT 82 10/16/2017 1355    No results found.   Past medical hx Past Medical History:  Diagnosis Date  . Anemia    pernicious anemia - 1991   . Anxiety   . Arthritis    rheumatoid arthritis     . Chronic fatigue   . Cystitis, interstitial   . Depression   . Disc disorder of lumbar region 2013  . Fibromyalgia   . GERD (gastroesophageal reflux disease)   . History of kidney stones   . Hypothyroidism   . Lyme disease    hx of possible   . 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 Osteoporosis   . Pneumonia    hx of   . Rheumatoid arthritis ([place])      Social History   Tobacco Use  . Smoking status: Former Smoker    Packs/day: 2.00    Years: 20.00    Pack years: 40.00    Types: Cigarettes    Last attempt to quit: 02/11/1990    Years since quitting: 28.1  . Smokeless tobacco: Never Used  Substance Use Topics  . Alcohol use: Yes    Alcohol/week: 14.0 standard drinks    Types: 14 Standard drinks or equivalent per week    Comment: drink per nite scotch  . Drug use: No    Ms.Prescher reports that she quit smoking about 28 years ago. Her smoking use included cigarettes. She has a 40.00 pack-year smoking history. She has never used smokeless tobacco. She reports current  alcohol use of about 14.0 standard drinks of alcohol per week. She reports that she does not use drugs.  Tobacco Cessation: Former smoker, quit 1992 with a 40 pack year smoking history  Past surgical hx, Family hx, Social hx all reviewed.  Current Outpatient Medications on File Prior to Visit  Medication Sig  . acetaminophen (TYLENOL) 500 MG tablet Take 1,000 mg by mouth every 6 (six) hours as needed for mild pain. For pain  . albuterol (PROVENTIL HFA;VENTOLIN HFA) 108 (90 Base) MCG/ACT inhaler Inhale 2 puffs into the lungs every 6 (six) hours as needed for wheezing or shortness of breath. For shortness of breath  . ALPRAZolam (XANAX) 0.25 MG tablet Take 0.125 mg by mouth daily as needed for anxiety. Pt takes 1/2 tablet as needed   . calcium carbonate (OS-CAL - DOSED IN MG OF ELEMENTAL CALCIUM) 1250 MG tablet Take 1 tablet by mouth daily.  Marland Kitchen FLUoxetine  (PROZAC) 20 MG tablet Take 20 mg by mouth 2 (two) times daily.  . fluticasone (FLONASE) 50 MCG/ACT nasal spray Place 1 spray into both nostrils daily as needed for allergies or rhinitis.  Marland Kitchen levothyroxine (SYNTHROID, LEVOTHROID) 100 MCG tablet Take 50-100 mcg by mouth See admin instructions. TAKES 100 MCG DAILY EXCEPT ON THURSDAYS AND TAKES 50 MCG  . Melatonin 3 MG CAPS Take 3 mg by mouth at bedtime as needed (SLEEP).  . montelukast (SINGULAIR) 10 MG tablet Take 1 tablet (10 mg total) by mouth at bedtime.  . naproxen sodium (ANAPROX) 220 MG tablet Take 440 mg by mouth 2 (two) times daily as needed (PAIN).  Marland Kitchen omeprazole (PRILOSEC) 40 MG capsule TAKE 1 CAPSULE(40 MG) BY MOUTH DAILY  . ranitidine (ZANTAC) 300 MG tablet TAKE 1 TABLET(300 MG) BY MOUTH AT BEDTIME  . umeclidinium-vilanterol (ANORO ELLIPTA) 62.5-25 MCG/INH AEPB Inhale 1 puff into the lungs daily.  Marland Kitchen umeclidinium-vilanterol (ANORO ELLIPTA) 62.5-25 MCG/INH AEPB Inhale 1 puff into the lungs daily.  . Vitamin D, Ergocalciferol, (DRISDOL) 50000 UNITS CAPS Take 50,000 Units by mouth every 7 (seven) days. On Mondays   No current facility-administered medications on file prior to visit.      Allergies  Allergen Reactions  . Amoxicillin Nausea And Vomiting     Has patient had a PCN reaction causing immediate rash, facial/tongue/throat swelling, SOB or lightheadedness with hypotension: #  #  #  YES  #  #  #  Has patient had a PCN reaction causing severe rash involving mucus membranes or skin necrosis: No Has patient had a PCN reaction that required hospitalization No Has patient had a PCN reaction occurring within the last 10 years: #  #  #  YES  #  #  #  If all of the above answers are "NO", then may proceed with Cephalosporin use.  . Augmentin [Amoxicillin-Pot Clavulanate] Nausea And Vomiting  . Demerol [Meperidine Hcl] Nausea And Vomiting  . Sulfa Antibiotics Nausea And Vomiting    Review Of Systems:  Constitutional:   No  weight  loss, night sweats,  Fevers, chills, fatigue, or  lassitude.  HEENT:   No headaches,  Difficulty swallowing,  Tooth/dental problems, or  Sore throat,                No sneezing, itching, ear ache, + nasal congestion,+ post nasal drip,   CV:  No chest pain,  Orthopnea, PND, swelling in lower extremities, anasarca, dizziness, palpitations, syncope.   GI  No heartburn, indigestion, abdominal pain, nausea, vomiting, diarrhea, change in  bowel habits, loss of appetite, bloody stools.   Resp: No shortness of breath with exertion or at rest.  + excess mucus, no productive cough,  No non-productive cough,  No coughing up of blood.  No change in color of mucus.  No wheezing.  No chest wall deformity  Skin: no rash or lesions.  GU: no dysuria, change in color of urine, no urgency or frequency.  No flank pain, no hematuria   MS:  No joint pain or swelling.  No decreased range of motion.  No back pain.  Psych:  No change in mood or affect. No depression or anxiety.  No memory loss.   Vital Signs BP (!) 160/82 (BP Location: Left Arm, Cuff Size: Normal)   Pulse 83   Temp 98.2 F (36.8 C) (Oral)   Ht 5' (1.524 m)   Wt 109 lb (49.4 kg)   SpO2 95%   BMI 21.29 kg/m    Physical Exam:  General- No distress,  A&Ox3, pleasant, wearing a mask ENT: No sinus tenderness, TM clear, pale nasal mucosa, no oral exudate,+ post nasal drip, no LAN Cardiac: S1, S2, regular rate and rhythm, no murmur Chest: No wheeze/ rales/ dullness; no accessory muscle use, no nasal flaring, no sternal retractions Abd.: Soft Non-tender, ND, BS + Ext: No clubbing cyanosis, edema Neuro:  normal strength, MAE x 4, A&O x 3, appropriate Skin: No rashes, warm and dry, no lesions Psych: normal mood and behavior   Assessment/Plan  Allergic rhinitis Flare after exposure to sick grand child Had stopped Anoro x 2 weeks Plan: Flu Swab We will send in a prescription for a z pack. Take as directed if you get worse and not  better.. We will send in a prescription for Hydromet cough syrup Do not drive if sleepy Take 1 teaspoon at bedtime for sleep Delsym cough syrup every 12 hours while sick. This will not make you sleepy For your Allergic Rhinitis Continue to Treat and prevent inflammation: Plan: Continue Flonase 1-2 sprays each nostril 1 time per day Continue montelukast 10 mg tablet 1 time per day Resume Anoro 1 puff once daily  Do not hesitate to restart therapy if symptoms return. Call the office and let us know so we can call in a prescription  For your flare: Plan OTC antihistamine Nasal saline Proventil HFA or similar 2 inhalations every 4-6 hours     GERD (gastroesophageal reflux disease) GERD (gastroesophageal reflux disease) Well controlled on maintenance therapy Plan Continue  to consolidate caffeine and alcohol consumption Continue Omeprazole 40 mg tablet in a.m. Continue Ranitidine 300 mg tablet in p.m. Consider decreasing dosage  in 3-6 months per Dr. Neldon Mc Follow up appointment in 2 weeks with Judson Roch NP Please contact office for sooner follow up if symptoms do not improve or worsen   Sinusitis Z pack in the event of worsening symptoms, which were reviewed in full.    [name], NP 04/01/2018  2:47 PM

## 2018-04-01 NOTE — Assessment & Plan Note (Signed)
Z pack in the event of worsening symptoms, which were reviewed in full.

## 2018-04-01 NOTE — Addendum Note (Signed)
Addended by: Jannette Spanner on: 04/01/2018 03:07 PM   Modules accepted: Orders

## 2018-04-01 NOTE — Progress Notes (Signed)
Reviewed, agree 

## 2018-04-01 NOTE — Assessment & Plan Note (Addendum)
Flare after exposure to sick grand child Had stopped Anoro x 2 weeks Plan: Flu Swab We will send in a prescription for a z pack. Take as directed if you get worse and not better.. We will send in a prescription for Hydromet cough syrup Do not drive if sleepy Take 1 teaspoon at bedtime for sleep Delsym cough syrup every 12 hours while sick. This will not make you sleepy For your Allergic Rhinitis Continue to Treat and prevent inflammation: Plan: Continue Flonase 1-2 sprays each nostril 1 time per day Continue montelukast 10 mg tablet 1 time per day Resume Anoro 1 puff once daily  Do not hesitate to restart therapy if symptoms return. Call the office and let us know so we can call in a prescription  For your flare: Plan OTC antihistamine Nasal saline Proventil HFA or similar 2 inhalations every 4-6 hours

## 2018-04-01 NOTE — Assessment & Plan Note (Signed)
GERD (gastroesophageal reflux disease) Well controlled on maintenance therapy Plan Continue  to consolidate caffeine and alcohol consumption Continue Omeprazole 40 mg tablet in a.m. Continue Ranitidine 300 mg tablet in p.m. Consider decreasing dosage  in 3-6 months per Dr. Neldon Mc Follow up appointment in 2 weeks with Judson Roch NP Please contact office for sooner follow up if symptoms do not improve or worsen

## 2018-04-01 NOTE — Patient Instructions (Addendum)
It is good to see you today. I am so sorry you are not feling well. We will send in a prescription for a z pack. Take as directed if you get worse and not better.. We will send in a prescription for Hydromet cough syrup Do not drive if sleepy Take 1 teaspoon at bedtime for sleep Delsym cough syrup every 12 hours while sick. This will not make you sleepy For your Allergic Rhinitis Continue to Treat and prevent inflammation: Plan: Continue Flonase 1-2 sprays each nostril 1 time per day Continue montelukast 10 mg tablet 1 time per day Resume Anoro 1 puff once daily  Do not hesitate to restart therapy if symptoms return. Call the office and let us know so we can call in a prescription  For your flare: Plan OTC antihistamine Nasal saline Proventil HFA or similar 2 inhalations every 4-6 hours   GERD (gastroesophageal reflux disease) Well controlled on maintenance therapy Plan Continue  to consolidate caffeine and alcohol consumption Continue Omeprazole 40 mg tablet in a.m. Continue Ranitidine 300 mg tablet in p.m. Consider decreasing dosage  in 3-6 months per Dr. Neldon Mc Follow up appointment in 2 weeks with Judson Roch NP Please contact office for sooner follow up if symptoms do not improve or worsen or seek emergency care

## 2018-04-01 NOTE — Telephone Encounter (Signed)
   Reason for call:  She is coughing up mucus is yellow and having chest congestion.Fever of 100.   Patient is scheduled for an apt with SG

## 2018-04-04 ENCOUNTER — Other Ambulatory Visit: Payer: Self-pay | Admitting: Allergy and Immunology

## 2018-04-06 ENCOUNTER — Telehealth: Payer: Self-pay | Admitting: Allergy and Immunology

## 2018-04-06 MED ORDER — RANITIDINE HCL 300 MG PO TABS: ORAL_TABLET | ORAL | 5 refills | Status: DC

## 2018-04-06 MED ORDER — OMEPRAZOLE 40 MG PO CPDR: DELAYED_RELEASE_CAPSULE | ORAL | 1 refills | Status: DC

## 2018-04-06 MED ORDER — MONTELUKAST SODIUM 10 MG PO TABS: ORAL_TABLET | ORAL | 5 refills | Status: DC

## 2018-04-06 NOTE — Telephone Encounter (Signed)
patient needs a refill on omeprazole, ranitidine, and montelukast sent in to the [facility] at [place] [phone]  If there are any questions please contact the patient at [phone]

## 2018-04-06 NOTE — Telephone Encounter (Signed)
Refills sent in

## 2018-04-07 DIAGNOSIS — M25512 Pain in left shoulder: Secondary | ICD-10-CM | POA: Diagnosis not present

## 2018-04-13 ENCOUNTER — Encounter: Payer: Self-pay | Admitting: Acute Care

## 2018-04-13 ENCOUNTER — Ambulatory Visit (INDEPENDENT_AMBULATORY_CARE_PROVIDER_SITE_OTHER): Payer: Medicare Other | Admitting: Acute Care

## 2018-04-13 DIAGNOSIS — K219 Gastro-esophageal reflux disease without esophagitis: Secondary | ICD-10-CM | POA: Diagnosis not present

## 2018-04-13 DIAGNOSIS — J0110 Acute frontal sinusitis, unspecified: Secondary | ICD-10-CM

## 2018-04-13 DIAGNOSIS — J309 Allergic rhinitis, unspecified: Secondary | ICD-10-CM | POA: Diagnosis not present

## 2018-04-13 DIAGNOSIS — J449 Chronic obstructive pulmonary disease, unspecified: Secondary | ICD-10-CM | POA: Diagnosis not present

## 2018-04-13 MED ORDER — GLYCOPYRROLATE-FORMOTEROL 9-4.8 MCG/ACT IN AERO: 2.0000 | INHALATION_SPRAY | Freq: Two times a day (BID) | RESPIRATORY_TRACT | 0 refills | Status: DC

## 2018-04-13 MED ORDER — FLUTICASONE PROPIONATE 50 MCG/ACT NA SUSP: 2.0000 | Freq: Every day | NASAL | 2 refills | Status: DC

## 2018-04-13 NOTE — Progress Notes (Signed)
History of Present Illness Pamela Larson is a 73 y.o. female former smoker ( quit 1992 with a 40 pack year smoking history) with recurrent infections, and bronchitis. She is followed by Dr. Lake Bells.  Synopsis: Referred inJuly 2061for recurrent bronchitis episodes ever since August 2015.;smoked 2 packs of cigarettes daily for 20 years, quit in 1992witha 40 pack year smoking history.     04/13/2018 2 week follow up of bronchitic flare with body aches and low grade fever. Pt. Presents for follow up. She was seen 04/01/2018 for a  flare of her bronchitis after exposure to a sick grandchild. She was experiencing body aches and low grade fever. She was prescribed a z pack to hold and  take if she had progressive worsening of her flare.She was prescribed Hydromet for cough and encouraged to continue her allergic  rhinitis regimen for flares. She had stopped her Anoro prior to this Flare, as she wanted to see if she could wean herself from it due to cost. We re-started the Anoro for the flare to see if she felt benefit. She is planning to stop the Anoro because she would like to not have to take it as maintenence She presents today and states she did have to take the z pack. Her temperature did not resolve on its own.Her baseline temperature is 97.4.She had temps of up to 99.0. Additionally her secretions are yellow and she continues to have post nasal drip. She is using her Flonase and Singulair and Claritin. Secretions are clear to light yellow. She has no cough.She has no recent fever.She denies chest pain, orthopnea or hemoptysis.She has not had to use her rescue inhaler.  Test Results:  CBC Latest Ref Rng & Units 10/07/2017 03/27/2016 11/06/2014  WBC 3.4 - 10.8 x10E3/uL 7.9 6.1 8.5  Hemoglobin 11.1 - 15.9 g/dL 14.8 14.0 13.7  Hematocrit 34.0 - 46.6 % 43.1 42.2 41.1  Platelets 150 - 450 x10E3/uL 326 328 305    BMP Latest Ref Rng & Units 03/27/2016 11/06/2014 11/02/2013  Glucose 65 - 99 mg/dL 86  87 115(H)  BUN 6 - 20 mg/dL 10 10 10   Creatinine 0.44 - 1.00 mg/dL 0.65 0.77 0.69  Sodium 135 - 145 mmol/L 139 139 139  Potassium 3.5 - 5.1 mmol/L 4.3 4.1 3.4(L)  Chloride 101 - 111 mmol/L 101 99(L) 99  CO2 22 - 32 mmol/L 25 27 22   Calcium 8.9 - 10.3 mg/dL 9.8 9.7 9.1    BNP No results found for: BNP  ProBNP No results found for: PROBNP  PFT    Component Value Date/Time   FEV1PRE 2.08 10/16/2017 1355   FEV1POST 2.10 10/16/2017 1355   FVCPRE 2.60 10/16/2017 1355   FVCPOST 2.55 10/16/2017 1355   TLC 4.43 10/16/2017 1355   DLCOUNC 15.92 10/16/2017 1355   PREFEV1FVCRT 80 10/16/2017 1355   PSTFEV1FVCRT 82 10/16/2017 1355    No results found.   Past medical hx Past Medical History:  Diagnosis Date  . Anemia    pernicious anemia - 1991   . Anxiety   . Arthritis    rheumatoid arthritis   . Chronic fatigue   . Cystitis, interstitial   . Depression   . Disc disorder of lumbar region 2013  . Fibromyalgia   . GERD (gastroesophageal reflux disease)   . History of kidney stones   . Hypothyroidism   . Lyme disease    hx of possible   . Neuromuscular disorder ([facility])    hx of neuropathy in  1991 due to pernicious anemia   . Osteoporosis   . Pneumonia    hx of   . Rheumatoid arthritis ([place])      Social History   Tobacco Use  . Smoking status: Former Smoker    Packs/day: 2.00    Years: 20.00    Pack years: 40.00    Types: Cigarettes    Last attempt to quit: 02/11/1990    Years since quitting: 28.1  . Smokeless tobacco: Never Used  Substance Use Topics  . Alcohol use: Yes    Alcohol/week: 14.0 standard drinks    Types: 14 Standard drinks or equivalent per week    Comment: drink per nite scotch  . Drug use: No    Ms.Aime reports that she quit smoking about 28 years ago. Her smoking use included cigarettes. She has a 40.00 pack-year smoking history. She has never used smokeless tobacco. She reports current alcohol use of about 14.0 standard drinks of alcohol  per week. She reports that she does not use drugs.  Tobacco Cessation: Former smoker Quit 1992 with a 40 pack year smoking history  Past surgical hx, Family hx, Social hx all reviewed.  Current Outpatient Medications on File Prior to Visit  Medication Sig  . acetaminophen (TYLENOL) 500 MG tablet Take 1,000 mg by mouth every 6 (six) hours as needed for mild pain. For pain  . albuterol (PROVENTIL HFA;VENTOLIN HFA) 108 (90 Base) MCG/ACT inhaler Inhale 2 puffs into the lungs every 6 (six) hours as needed for wheezing or shortness of breath. For shortness of breath  . ALPRAZolam (XANAX) 0.25 MG tablet Take 0.125 mg by mouth daily as needed for anxiety. Pt takes 1/2 tablet as needed   . calcium carbonate (OS-CAL - DOSED IN MG OF ELEMENTAL CALCIUM) 1250 MG tablet Take 1 tablet by mouth daily.  Marland Kitchen FLUoxetine (PROZAC) 20 MG tablet Take 20 mg by mouth 2 (two) times daily.  . fluticasone (FLONASE) 50 MCG/ACT nasal spray Place 1 spray into both nostrils daily as needed for allergies or rhinitis.  Marland Kitchen HYDROcodone-homatropine (HYCODAN) 5-1.5 MG/5ML syrup Take 5 mLs by mouth at bedtime as needed for cough.  . levothyroxine (SYNTHROID, LEVOTHROID) 100 MCG tablet Take 50-100 mcg by mouth See admin instructions. TAKES 100 MCG DAILY EXCEPT ON THURSDAYS AND TAKES 50 MCG  . Melatonin 3 MG CAPS Take 3 mg by mouth at bedtime as needed (SLEEP).  . montelukast (SINGULAIR) 10 MG tablet TAKE 1 TABLET(10 MG) BY MOUTH AT BEDTIME  . naproxen sodium (ANAPROX) 220 MG tablet Take 440 mg by mouth 2 (two) times daily as needed (PAIN).  Marland Kitchen omeprazole (PRILOSEC) 40 MG capsule TAKE 1 CAPSULE(40 MG) BY MOUTH DAILY  . ranitidine (ZANTAC) 300 MG tablet TAKE 1 TABLET(300 MG) BY MOUTH AT BEDTIME  . umeclidinium-vilanterol (ANORO ELLIPTA) 62.5-25 MCG/INH AEPB Inhale 1 puff into the lungs daily.  Marland Kitchen umeclidinium-vilanterol (ANORO ELLIPTA) 62.5-25 MCG/INH AEPB Inhale 1 puff into the lungs daily.  . Vitamin D, Ergocalciferol, (DRISDOL) 50000  UNITS CAPS Take 50,000 Units by mouth every 7 (seven) days. On Mondays   No current facility-administered medications on file prior to visit.      Allergies  Allergen Reactions  . Amoxicillin Nausea And Vomiting     Has patient had a PCN reaction causing immediate rash, facial/tongue/throat swelling, SOB or lightheadedness with hypotension: #  #  #  YES  #  #  #  Has patient had a PCN reaction causing severe rash involving mucus membranes or  skin necrosis: No Has patient had a PCN reaction that required hospitalization No Has patient had a PCN reaction occurring within the last 10 years: #  #  #  YES  #  #  #  If all of the above answers are "NO", then may proceed with Cephalosporin use.  . Augmentin [Amoxicillin-Pot Clavulanate] Nausea And Vomiting  . Demerol [Meperidine Hcl] Nausea And Vomiting  . Sulfa Antibiotics Nausea And Vomiting    Review Of Systems:  Constitutional:   No  weight loss, night sweats,  Fevers, chills, + fatigue, or  lassitude.  HEENT:   No headaches,  Difficulty swallowing,  Tooth/dental problems, or  Sore throat,                No sneezing, itching, ear ache,+ nasal congestion,+ post nasal drip,   CV:  No chest pain,  Orthopnea, PND, swelling in lower extremities, anasarca, dizziness, palpitations, syncope.   GI  No heartburn, indigestion, abdominal pain, nausea, vomiting, diarrhea, change in bowel habits, loss of appetite, bloody stools.   Resp: No shortness of breath with exertion or at rest.  No excess mucus, no productive cough,  No non-productive cough,  No coughing up of blood.  No change in color of mucus.  No wheezing.  No chest wall deformity  Skin: no rash or lesions.  GU: no dysuria, change in color of urine, no urgency or frequency.  No flank pain, no hematuria   MS:  No joint pain or swelling.  No decreased range of motion.  No back pain.  Psych:  No change in mood or affect. No depression or anxiety.  No memory loss.   Vital Signs BP  130/80 (BP Location: Right Arm)   Pulse 78   Ht 5' (1.524 m)   Wt 105 lb 12.8 oz (48 kg)   SpO2 96%   BMI 20.66 kg/m    Physical Exam:  General- No distress,  A&Ox3, pleasant ENT: No sinus tenderness, TM clear, pale nasal mucosa, no oral exudate,+ post nasal drip, no LAN Cardiac: S1, S2, regular rate and rhythm, no murmur Chest: No wheeze/ rales/ dullness; no accessory muscle use, no nasal flaring, no sternal retractions Abd.: Soft Non-tender, ND, BS + Ext: No clubbing cyanosis, edema Neuro:  normal strength, MAE x 4, A&O x 3 Skin: No rashes, warm and dry Psych: normal mood and behavior   Assessment/Plan  Allergic rhinitis Continued post nasal gtt No fever No nasal or sinus tenderness Plan Continue your regimen for perennial allergic rhinitis  Flonase 1-2 sprays each nostril 1 time per day               montelukast 10 mg tablet 1 time per day  If needed for worsening sinus issues              A.  OTC antihistamine             B.  Nasal saline             C.  Proventil HFA or similar 2 inhalations every 4-6 hours Follow up in 3 months, or sooner as needed for any acute issues. We always have time to see you.         Sinusitis Sinusitis vs allergic rhinitis Developed fever Did take z pack prescribed for worsening with fever 2/19 Now better Plan Flonase 1-2 sprays each nostril 1 time per day  montelukast 10 mg tablet 1 time per day  If needed for worsening sinus issues              A.  OTC antihistamine             B.  Nasal saline             C.  Proventil HFA or similar 2 inhalations every 4-6 hours   Call the office for any further issues early on so we can be proactive Follow up in 3 months  Remember you can be seen any time you feel you have an acute worsening. Remember to use handwashing as a way to decrease spread of germs. Consider homeopathic Emergent C and Zinc to decrease the length of time until full recovery.   Please contact  office for sooner follow up if symptoms do not improve or worsen or seek emergency care    COPD with chronic bronchitis and emphysema ([place]) Therapeutic Trial of Bevespi Plan We will stop the Anoro due to cost   Medtronic Use this 2 puffs twice daily Rinse mouth after use Have your pharmacist check the price of the [place] and also Stialto to see if either is better for you cost wise. Proventil HFA or similar 2 inhalations every 4-6 hours as needed for breakthrough shortness of breath  Call the office for any further issues early on so we can be proactive Follow up in 3 months  Remember you can be seen any time you feel you have an acute worsening. Remember to use handwashing as a way to decrease spread of germs.  Please contact office for sooner follow up if symptoms do not improve or worsen or seek emergency care    GERD (gastroesophageal reflux disease) Compliant with regimen Plan Continue to Treat and prevent reflux:              A.  Continue to consolidate caffeine and alcohol consumption             B.  Omeprazole 40 mg tablet in a.m.             C.  Ranitidine 300 mg tablet in p.m.    [name], NP 04/13/2018  12:04 PM

## 2018-04-13 NOTE — Assessment & Plan Note (Addendum)
Therapeutic Trial of Bevespi Plan We will stop the Anoro due to cost   Medtronic Use this 2 puffs twice daily Rinse mouth after use Have your pharmacist check the price of the [place] and also Stialto to see if either is better for you cost wise. Proventil HFA or similar 2 inhalations every 4-6 hours as needed for breakthrough shortness of breath  Call the office for any further issues early on so we can be proactive Follow up in 3 months  Remember you can be seen any time you feel you have an acute worsening. Remember to use handwashing as a way to decrease spread of germs.  Please contact office for sooner follow up if symptoms do not improve or worsen or seek emergency care

## 2018-04-13 NOTE — Assessment & Plan Note (Signed)
Compliant with regimen Plan Continue to Treat and prevent reflux:              A.  Continue to consolidate caffeine and alcohol consumption             B.  Omeprazole 40 mg tablet in a.m.             C.  Ranitidine 300 mg tablet in p.m.

## 2018-04-13 NOTE — Patient Instructions (Signed)
It is good to see you today. I am glad you are feeling better. Continue your regimen for perennial allergic rhinitis  Flonase 1-2 sprays each nostril 1 time per day               montelukast 10 mg tablet 1 time per day          Continue to Treat and prevent reflux:              A.  Continue to consolidate caffeine and alcohol consumption             B.  Omeprazole 40 mg tablet in a.m.             C.  Ranitidine 300 mg tablet in p.m.  If needed for worsening sinus issues              A.  OTC antihistamine             B.  Nasal saline             C.  Proventil HFA or similar 2 inhalations every 4-6 hours  We will stop the Anoro due to cost   [employer] Use this 2 puffs twice daily Rinse mouth after use Have your pharmacist check the price of the Manning and also Stialto.  Call the office for any further issues early on so we can be proactive Follow up in 3 months  Remember you can be seen any time you feel you have an acute worsening. Remember to use handwashing as a way to decrease spread of germs. Consider homeopathic Emergent C and Zinc to decrease the length of time until full recovery.   Please contact office for sooner follow up if symptoms do not improve or worsen or seek emergency care

## 2018-04-13 NOTE — Progress Notes (Signed)
Reviewed, agree 

## 2018-04-13 NOTE — Assessment & Plan Note (Addendum)
Continued post nasal gtt No fever No nasal or sinus tenderness Plan Continue your regimen for perennial allergic rhinitis  Flonase 1-2 sprays each nostril 1 time per day               montelukast 10 mg tablet 1 time per day  If needed for worsening sinus issues              A.  OTC antihistamine             B.  Nasal saline             C.  Proventil HFA or similar 2 inhalations every 4-6 hours Follow up in 3 months, or sooner as needed for any acute issues. We always have time to see you.

## 2018-04-13 NOTE — Assessment & Plan Note (Addendum)
Sinusitis vs allergic rhinitis Developed fever Did take z pack prescribed for worsening with fever 2/19 Now better Plan Flonase 1-2 sprays each nostril 1 time per day               montelukast 10 mg tablet 1 time per day  If needed for worsening sinus issues              A.  OTC antihistamine             B.  Nasal saline             C.  Proventil HFA or similar 2 inhalations every 4-6 hours   Call the office for any further issues early on so we can be proactive Follow up in 3 months  Remember you can be seen any time you feel you have an acute worsening. Remember to use handwashing as a way to decrease spread of germs. Consider homeopathic Emergent C and Zinc to decrease the length of time until full recovery.   Please contact office for sooner follow up if symptoms do not improve or worsen or seek emergency care

## 2018-04-15 ENCOUNTER — Ambulatory Visit: Payer: Medicare Other | Admitting: Acute Care

## 2018-04-21 DIAGNOSIS — L821 Other seborrheic keratosis: Secondary | ICD-10-CM | POA: Diagnosis not present

## 2018-04-21 DIAGNOSIS — D692 Other nonthrombocytopenic purpura: Secondary | ICD-10-CM | POA: Diagnosis not present

## 2018-04-21 DIAGNOSIS — D225 Melanocytic nevi of trunk: Secondary | ICD-10-CM | POA: Diagnosis not present

## 2018-04-23 ENCOUNTER — Telehealth: Payer: Self-pay

## 2018-04-23 DIAGNOSIS — Z1231 Encounter for screening mammogram for malignant neoplasm of breast: Secondary | ICD-10-CM | POA: Diagnosis not present

## 2018-04-23 DIAGNOSIS — Z803 Family history of malignant neoplasm of breast: Secondary | ICD-10-CM | POA: Diagnosis not present

## 2018-04-23 NOTE — Telephone Encounter (Signed)
Message routed to Sela Hilding, NP

## 2018-04-23 NOTE — Telephone Encounter (Signed)
Received call from patient, she would like to let SG know she is completely healed however she is wanting a less expensive inhaler. She spoke with her pharmacist and she would like to switch to Symbicort. She is currently taking Anoro but would like to switch to symbicort as it is now generic.   Pharmacy: [facility] on [address]  Call Back # [phone]

## 2018-04-27 NOTE — Telephone Encounter (Signed)
What strength Symbicort do you want?

## 2018-04-27 NOTE — Telephone Encounter (Signed)
OK to switch to Symbicort 2 puffs twice daily Rinse mouth after use. Please send in Rx

## 2018-04-28 NOTE — Telephone Encounter (Signed)
Please see if she was on the Anoro 100 or 200. If she was on 100, the Symbicort 80, if she was on 200, then Symbicort 160. Thanks

## 2018-04-28 NOTE — Telephone Encounter (Signed)
Anoro does not come in 100 or 200. Perhaps you meant Breo? I checked her previous medications and did not see this as well.

## 2018-04-28 NOTE — Telephone Encounter (Signed)
Called and spoke with patient there is no option for Anoro 100 or 200 please advise thank you.

## 2018-04-29 MED ORDER — BUDESONIDE-FORMOTEROL FUMARATE 80-4.5 MCG/ACT IN AERO: 2.0000 | INHALATION_SPRAY | Freq: Two times a day (BID) | RESPIRATORY_TRACT | 11 refills | Status: DC

## 2018-04-29 NOTE — Addendum Note (Signed)
Addended by: Rosana Berger on: 04/29/2018 01:34 PM   Modules accepted: Orders

## 2018-04-29 NOTE — Telephone Encounter (Signed)
Spoke with the pt and notified that SG wants to prescribe the symbicort 80 for her  She verbalized understanding  Rx was sent to her preferred pharm  Nothing further needed

## 2018-04-29 NOTE — Telephone Encounter (Signed)
Start on Symbicort 80

## 2018-05-27 ENCOUNTER — Telehealth: Payer: Self-pay

## 2018-05-27 ENCOUNTER — Encounter: Payer: Self-pay | Admitting: Acute Care

## 2018-05-27 ENCOUNTER — Ambulatory Visit (INDEPENDENT_AMBULATORY_CARE_PROVIDER_SITE_OTHER): Payer: Medicare Other | Admitting: Acute Care

## 2018-05-27 ENCOUNTER — Other Ambulatory Visit: Payer: Self-pay

## 2018-05-27 DIAGNOSIS — J321 Chronic frontal sinusitis: Secondary | ICD-10-CM | POA: Diagnosis not present

## 2018-05-27 DIAGNOSIS — J329 Chronic sinusitis, unspecified: Secondary | ICD-10-CM | POA: Diagnosis not present

## 2018-05-27 MED ORDER — AZITHROMYCIN 250 MG PO TABS: ORAL_TABLET | ORAL | 0 refills | Status: DC

## 2018-05-27 NOTE — Telephone Encounter (Signed)
Call returned to patient, she states she has not been feeling well today. She states she is having a lot of sinus pressure.Marland Kitchen Her face is tender to touch. She states this has been going on for about 4 days. She reports having increased headaches. Thinks she has sinus infection. She has been using her nasal spray and antihistamine and mucinex. She reports feeling very congested in the head and when she blows her nose she does notice blood tinged mucous. Tele visit made. Nothing further is needed at this time.

## 2018-05-27 NOTE — Progress Notes (Signed)
Virtual Visit via Telephone Note  I connected with Pamela Larson on 05/27/18 at  4:00 PM EDT by telephone and verified that I am speaking with the correct person using two identifiers.   I discussed the limitations, risks, security and privacy concerns of performing an evaluation and management service by telephone and the availability of in person appointments. I also discussed with the patient that there may be a patient responsible charge related to this service. The patient expressed understanding and agreed to proceed.  I  confirmed date of birth and address to authenticate patient   Identity. My nurse Quentin Ore reviewed medications and ordered any refills required.  Synopsis: Pamela Larson is a 73 y.o. female former smoker ( quit 1992 with a 40 pack year smoking history) with recurrent infections, and bronchitis. She is followed by Dr. Lake Bells. Referred inJuly 209for recurrent bronchitis episodes ever since August 2015.;smoked 2 packs of cigarettes daily for 20 years, quit in 1992witha 40 pack year smoking history.   History of Present Illness: Pt. Presents for a virtual phone  visit. She states she has had sinus pressure, and pain  and her face is tender to touch.She states this has been going on for about 4-5  Days. She states she woke up today and it was worse. She has headaches that  Also started today..She feels this is a sinus infection,as she has them frequently and her symptoms are consistent with this. She has sinus congestion and she did notice some blood tinged mucus when she blew her nose.She states her pain is on her left side not on her right side. She states this was just pink tinged. She has been using her nasal spray and antihistamine and mucinex.She states she has had some chills but has not checked her temp.Again, she is concerned she has a sinus infection.  She had originally been maintained on Anoro, which she feels gives her superior control. Cost  however is an issue.We tried Bevespi, but this made her shaky. She has now been restarted on Symbicort 80 as this is what her insurance prefers. She stopped taking this about 2 weeks ago, because she was concerned she had thrush.  She has been outside and she feels the pollen is making her sinus worse. She has been compliant with her allergy regimen.   Observations/Objective: Chest imaging: Chest x-ray from August 2015 was independently reviewed showing possible emphysematous changes (increased AP diameter) with airway thickening in the right lower lobe notable 08/27/2017 HRCT images independently reviewed: Grossly normal pulmonary parenchyma, some mild centrilobular emphysema and airway thickening noted.  Aortic atherosclerosis noted.  PFT: September 2019 ratio 80%, FEV1 2.10 L 110% predicted, total lung capacity 4.43 L 99% predicted, DLCO 15.92 mL 84% predicted  Labs: 2019 eosinophil count 0, IgE August 27th 2019 7  Spirometry was performed and demonstrated an FEV1 of 1.71 at 86 % of predicted.  Results of blood tests obtained 06 January 2018 after receiving Pneumovax vaccination identified increase in antibody titers directed against multiple serotypes of pneumococcus.  Her elevation in titers were somewhere around 2 fold at greatest for the majority of these elevations.  Assessment and Plan:  Sinus Infection Sinus tenderness/ pain Blood tinged secretions ? Fever Plan We will send in a prescription for z pack  Please take as directed Flonase 1-2 sprays each nostril 1 time per day  montelukast 10 mg tablet 1 time per day Continue Claritin 10 mg once daily Continue nasal saline as needed Consider decongestant  products ( Advil Pain and Sinus or Sudafed) Follow up tele visit in 1 week 06/03/2018 at 2 pm  Call if you are getting worse not better after starting the Z pack Please contact office for sooner follow up if symptoms do not improve or worsen or seek emergency  care  Call the office for any further issues early on so we can be proactive Remember you can be seen any time you feel you have an acute worsening.   COPD Failed therapeutic Trial of Bevespi Anoro is too expensive Was prescribed Symbicort 80 , but stopped using it 2 weeks ago ? Thought she has thrush Plan: Resume Symbicort 2 puffs twice daily Rinse mouth after use  Culturelle  Probiotic one tablet once daily Call if you need treatment for thrush.   COVID 19 Remember to use handwashing as a way to decrease spread of germs. Consider homeopathic Emergent C and Zinc to decrease the length of time until full recovery.   Please contact office for sooner follow up if symptoms do not improve or worsen or seek emergency care    Follow Up Instructions:  06/03/2018 at 2 pm with Judson Roch NP   I discussed the assessment and treatment plan with the patient. The patient was provided an opportunity to ask questions and all were answered. The patient agreed with the plan and demonstrated an understanding of the instructions.   The patient was advised to call back or seek an in-person evaluation if the symptoms worsen or if the condition fails to improve as anticipated.  I provided 25  minutes of non-face-to-face time during this encounter.   [name], NP 05/27/2018 4:29 PM

## 2018-05-27 NOTE — Patient Instructions (Addendum)
  It was great to talk with you today. We will send in a prescription for z pack  Please take as directed Resume Symbicort 2 puffs twice daily Rinse mouth after use  Culturelle  Probiotic one tablet once daily Call if you need treatment for thrush. Flonase 1-2 sprays each nostril 1 time per day  montelukast 10 mg tablet 1 time per day If needed for worsening sinus issues Continue Claritin 10 mg once daily Continue nasal saline as needed Consider decongestant products ( Advil Pain and Sinus or Sudafed) Follow up tele visit in 1 week 06/03/2018 at 2 pm  Call if you are getting worse not better after starting the Z pack Please contact office for sooner follow up if symptoms do not improve or worsen or seek emergency care   Continue to self isolate, wash hand frequently, and wear a face mask if you leave your home. Remember to utilize the early hours at grocery stores/ drug stores  for people > 62 years old or any underlying health risks, as the stores are cleaner and less crowded at these times.

## 2018-06-03 ENCOUNTER — Other Ambulatory Visit: Payer: Self-pay

## 2018-06-03 ENCOUNTER — Ambulatory Visit (INDEPENDENT_AMBULATORY_CARE_PROVIDER_SITE_OTHER): Payer: Medicare Other | Admitting: Acute Care

## 2018-06-03 ENCOUNTER — Encounter: Payer: Self-pay | Admitting: Acute Care

## 2018-06-03 DIAGNOSIS — J0111 Acute recurrent frontal sinusitis: Secondary | ICD-10-CM

## 2018-06-03 NOTE — Progress Notes (Addendum)
Virtual Visit via Video Note  I connected with Pamela Larson on 06/03/18 at  2:00 PM EDT by a video enabled telemedicine application and verified that I am speaking with the correct person using two identifiers.   I discussed the limitations of evaluation and management by telemedicine and the availability of in person appointments. The patient expressed understanding and agreed to proceed. I  confirmed date of birth and address to authenticate patient  Identity. My nurse Quentin Ore reviewed medications and ordered any refills required.  Pt.is present in her home and I am present in the [facility]   Synopsis: Pamela Supak Ridenhouris a 73 y.o.femaleformer smoker ( quit 1992 with a 40 pack year smoking history) with recurrent infections, and bronchitis. She is followed by Dr. Lake Bells. Referred inJuly 2072for recurrent bronchitis episodes ever since August 2015.;smoked 2 packs of cigarettes daily for 20 years, quit in 1992witha 40 pack year smoking history.  History of Present Illness: Pt. Presents for virtual phone visit. She was seen for an acute phone visit 05/27/2018 for sinus pressure and pain with blood tinged mucus. These symptoms developed after she stopped taking her Symbicort 80 due to concerns of thrush. Treatment plan included z pack ( Allergy to Augmentin, Amoxicillin, and Sulfa drugs),probiotic,and resumption of allergy/ sinusitis regimen.( Flonase,  Singulair, Claritin, nasal saline, OTC decongestants, and follow up televisit in 1 week.She had been compliant with her COPD regimen of Symbicort. Flu shot  and Prevnar 13 are up to date.  She presents today telephonically for her follow up to ensure improvement after treatment . She states she has been doing well. She has completed the z pack. She states her  symptoms have improved and are resolving. She does not have pressure or pain in her sinuses.She does have occasional discomfort in her left nostril. She has  been compliant with her Symbicort , and her allergy regimen. She feels the pollen could have been a trigger. She does prefer Anoro, but it was very expensive on her current insurance plan. She is requesting financial assistance.     Observations/Objective: Chest imaging: Chest x-ray from August 2015 was independently reviewed showing possible emphysematous changes (increased AP diameter) with airway thickening in the right lower lobe notable 08/27/2017 HRCT images independently reviewed:Grossly normal pulmonary parenchyma, some mild centrilobular emphysema and airway thickening noted. Aortic atherosclerosis noted.  PFT: September 2019 ratio 80%, FEV1 2.10 L 110% predicted, total lung capacity 4.43 L 99% predicted, DLCO 15.92 mL 84% predicted  Labs: 2019 eosinophil count 0, IgE August 27th 2019 7  Spirometrywas performed and demonstrated an FEV1 of 1.71at 86% of predicted.  Results of blood testsobtained 06 January 2018 after receiving Pneumovax vaccination identified increase in antibody titers directed against multiple serotypes of pneumococcus. Her elevation in titers were somewhere around 2 fold at greatest for the majority of these elevations.  Assessment and Plan: Improving sinusitis/ sinus infection Plan Maintain allergy regimen Continue Flonase 1-2 sprays each nostril 1 time per day Continue montelukast 10 mg tablet 1 time per day Continue Claritin 10 mg once daily Continue nasal saline as needed Consider decongestant products ( Advil Pain and Sinus or Sudafed) Follow up Appointment 08/2018 as scheduled Consider CT sinus with next flare ( if COVID restrictions are lifted), and ENT referral is appropriate. Please contact office for sooner follow up if symptoms do not improve or worsen or seek emergency care  Call the office for any further issues early on so we can be proactive in treatment  Remember you can be seen any time you feel you have an acute  worsening.   COPD Failed therapeutic Trial of Bevespi Anoro is too expensive Currently compliant with Symbicort>> but she prefers Anoro Plan: Continue  Symbicort 2 puffs twice daily Rinse mouth after use  Continue Culturelle  Probiotic one tablet once daily Call if you need treatment for thrush. We will send you paperwork for financial assistance for Anoro We will prescribe it if you qualify for financial assistance Note your daily symptoms > remember "red flags" for COPD:  Increase in cough, increase in sputum production, increase in shortness of breath or activity intolerance. If you notice these symptoms, please call to be seen.     COVID 19 Prevention Measures Continue to self isolate, wash hands frequently, and wear a face mask if you leave your home. Remember to utilize the early hours at grocery stores/ drug stores  for people > 46 years old or any underlying health risks, as the stores are cleaner and less crowded at these times. Consider homeopathic Emergent C and Zinc to decrease the length of time until full recovery.  Please contact office for sooner follow up if symptoms do not improve or worsen or seek emergency care  Follow Up Instructions: Follow up 08/24/2018 at 11:30 am with Judson Roch NP   I discussed the assessment and treatment plan with the patient. The patient was provided an opportunity to ask questions and all were answered. The patient agreed with the plan and demonstrated an understanding of the instructions.   The patient was advised to call back or seek an in-person evaluation if the symptoms worsen or if the condition fails to improve as anticipated.  I provided 21 minutes of non-face-to-face time during this encounter.   [name], NP 06/03/2018 2:33 PM

## 2018-06-03 NOTE — Patient Instructions (Addendum)
It is good to talk with you today. I am so glad you are feeling better.  We will send you the paperwork for financial assistance for Anoro. Continue Symbicort 2 puffs twice daily  Rinse mouth after use. Continue Probiotic once daily If you think you may be developing Thrush, please continue your Symbicort, but call so we can prescribe a mouthwash to treat it. Continue Allergy Regimen: Continue Flonase 1-2 sprays each nostril 1 time per day Continue montelukast 10 mg tablet 1 time per day Continue Claritin 10 mg once daily Continue nasal saline as needed Consider decongestant products ( Advil Pain and Sinus or Sudafed) We will consider CT sinus with next flare. Review vaccines in July. ( up to date at present) Note your daily symptoms > remember "red flags" for COPD:  Increase in cough, increase in sputum production, increase in shortness of breath or activity intolerance. If you notice these symptoms, please call to be seen.     Continue to self isolate, wash hand frequently, and wear a face mask if you leave your home. Remember to utilize the early hours at grocery stores/ drug stores  for people > 68 years old or any underlying health risks, as the stores are cleaner and less crowded at these times.

## 2018-06-25 ENCOUNTER — Telehealth: Payer: Self-pay | Admitting: Acute Care

## 2018-06-25 NOTE — Telephone Encounter (Signed)
Called and spoke with Patient. Patient stated she had OV with Judson Roch, NP 06/03/18,for frontal sinusitis.  Patient stated she was given antibiotic.  Patient stated she is still having sinus pain.  Patient stated she has good days and bad days. Patient stated she is having sinus pressure with left eye pain.  Left side of her face hurts at times.  Patient stated she irrigates her sinus and that relieves it at times.  Patient stated she feels like something is in her left sinuses. Patient stated stated she was unsure if it was infection starting back, and she needed another antibiotic, or if she had a cyst, or abscess.  Patient stated Sarah, NP discussed possible sinus CT, but wanted to wait until middle of summer, due to Covid pandemic.  Patient stated her right side is completely fine.  Message routed to Sela Hilding, NP to advise

## 2018-06-26 ENCOUNTER — Other Ambulatory Visit: Payer: Self-pay

## 2018-06-26 ENCOUNTER — Encounter: Payer: Self-pay | Admitting: Acute Care

## 2018-06-26 ENCOUNTER — Telehealth (INDEPENDENT_AMBULATORY_CARE_PROVIDER_SITE_OTHER): Payer: Medicare Other | Admitting: Acute Care

## 2018-06-26 DIAGNOSIS — J321 Chronic frontal sinusitis: Secondary | ICD-10-CM | POA: Diagnosis not present

## 2018-06-26 MED ORDER — AZITHROMYCIN 250 MG PO TABS: ORAL_TABLET | ORAL | 0 refills | Status: DC

## 2018-06-26 NOTE — Patient Instructions (Signed)
It was great to talk with you today Happy Birthday!! We will Culture sputum once you have been off antibiotics x 6-8 weeks We will send in a prescription Z pack by [employer] We will order CT sinus : Chronic sinusitis Continue over the counter decongestant you purchased.  Use as directed Continue nasal irrigations with Saline with Aloe Continue Mucinex with a full glass of water Continue Flonase once daily as you have been doing. We will do a follow up telephone visit Tuesday 5/19 at 10 am with Judson Roch NP to ensure you are improving Please contact office for sooner follow up if symptoms do not improve or worsen or seek emergency care   Continue to practice social distancing, wash hand frequently, and wear a face mask if you leave your home. Remember to utilize the early hours at grocery stores/ drug stores  for people > 69 years old or any underlying health risks, as the stores are cleaner and less crowded at these times.

## 2018-06-26 NOTE — Progress Notes (Signed)
Reviewed, agree 

## 2018-06-26 NOTE — Addendum Note (Signed)
Addended by: Jannette Spanner on: 06/26/2018 04:01 PM   Modules accepted: Orders

## 2018-06-26 NOTE — Telephone Encounter (Signed)
Spoke with patient, she is aware of SG's recommendations. She has agreed to do the [employer] visit. She stated that she had had issues with it a few months ago but is willing to try it again. Advised her if that any problems arise this afternoon, we could switch her to just a televisit visit, she verbalized understanding.   Patient has been placed on Sarah's schedule for 230. Nothing further needed at time of call.

## 2018-06-26 NOTE — Progress Notes (Signed)
Virtual Visit via Telephone Note  I connected with Pamela Larson on 06/26/18 at  2:30 PM EDT by telephone and verified that I am speaking with the correct person using two identifiers.  Location: Patient: At home Provider: At [facility] [place]   I discussed the limitations, risks, security and privacy concerns of performing an evaluation and management service by telephone and the availability of in person appointments. I also discussed with the patient that there may be a patient responsible charge related to this service. The patient expressed understanding and agreed to proceed.  I  confirmed date of birth and address to authenticate patient  identity. My nurse Quentin Ore reviewed medications and ordered any refills required.    History of Present Illness: Pt. Presents for a tele visit. She states she had been doing well since her last appointment. She was treated with a z pack 03/2018. She got better after treatment with the Z pack. Then in April she had been having a headache over the left eye, which got progressively worse. She worked on her irrigations , and she is taking all of her medications. She states that this has flared again over the last 10 days. She states she has pain above her nostril on the left and up to her eye brow and temple. She states she has bloody nasal secretions. This is thick. She has been doing her nasal irrigations. She states she has had fatigue. She denies fever. She has been using her mucinex daily. She did get some over the counter decongestant, which she will start today.    Observations/Objective: Chest imaging: Chest x-ray from August 2015 was independently reviewed showing possible emphysematous changes (increased AP diameter) with airway thickening in the right lower lobe notable 08/27/2017 HRCT images independently reviewed:Grossly normal pulmonary parenchyma, some mild centrilobular emphysema and airway thickening noted.  Aortic atherosclerosis noted.  PFT: September 2019 ratio 80%, FEV1 2.10 L 110% predicted, total lung capacity 4.43 L 99% predicted, DLCO 15.92 mL 84% predicted  Labs: 2019 eosinophil count 0, IgE August 27th 2019 7  Spirometrywas performed and demonstrated an FEV1 of 1.71at 86% of predicted.  Results of blood testsobtained 06 January 2018 after receiving Pneumovax vaccination identified increase in antibody titers directed against multiple serotypes of pneumococcus. Her elevation in titers were somewhere around 2 fold at greatest for the majority of these elevations.   Assessment and Plan: Culture sputum once you have been off antibiotics x 6-8 weeks We will send in a prescription Z pack by [employer] We will order CT sinus : Chronic sinusitis Continue over the counter decongestant Use as directed Continue nasal irrigations with Saline with Aloe Continue Mucinex with a full glass of water Continue Flonase once daily as you have been doing. We will do a follow up telephone visit Tuesday 5/19 at 10 am with Judson Roch NP Please contact office for sooner follow up if symptoms do not improve or worsen or seek emergency care   Continue to practice social distancing, wash hand frequently, and wear a face mask if you leave your home. Remember to utilize the early hours at grocery stores/ drug stores  for people > 43 years old or any underlying health risks, as the stores are cleaner and less crowded at these times.  Follow Up Instructions: We will do a follow up telephone visit Tuesday 5/19 at 10 am with Judson Roch NP   I discussed the assessment and treatment plan with the patient. The patient was  provided an opportunity to ask questions and all were answered. The patient agreed with the plan and demonstrated an understanding of the instructions.   The patient was advised to call back or seek an in-person evaluation if the symptoms worsen or if the condition fails to improve as  anticipated.  I provided 25  minutes of non-face-to-face time during this encounter.   [name], NP 06/26/2018 2:52 PM

## 2018-06-26 NOTE — Telephone Encounter (Signed)
Sarah, please advise.

## 2018-06-26 NOTE — Telephone Encounter (Signed)
Can you add her as a video visit this afternoon at 2:30? Thanks

## 2018-06-30 ENCOUNTER — Encounter: Payer: Self-pay | Admitting: Acute Care

## 2018-06-30 ENCOUNTER — Ambulatory Visit (INDEPENDENT_AMBULATORY_CARE_PROVIDER_SITE_OTHER): Payer: Medicare Other | Admitting: Acute Care

## 2018-06-30 ENCOUNTER — Other Ambulatory Visit: Payer: Self-pay

## 2018-06-30 DIAGNOSIS — J0110 Acute frontal sinusitis, unspecified: Secondary | ICD-10-CM | POA: Diagnosis not present

## 2018-06-30 DIAGNOSIS — J0111 Acute recurrent frontal sinusitis: Secondary | ICD-10-CM | POA: Diagnosis not present

## 2018-06-30 NOTE — Progress Notes (Signed)
Virtual Visit via Telephone Note  I connected with [name] on 06/30/18 at 10:00 AM EDT by telephone and verified that I am speaking with the correct person using two identifiers.  Location: Patient: At Home Provider: In the office [place] discussed the limitations, risks, security and privacy concerns of performing an evaluation and management service by telephone and the availability of in person appointments. I also discussed with the patient that there may be a patient responsible charge related to this service. The patient expressed understanding and agreed to proceed.  I  confirmed date of birth and address to authenticate patient  Identity. My nurse Quentin Ore reviewed medications and ordered any refills required.   Pamela Larson is a 73 y.o. female former smoker ( quit 1992 with a 40 pack year smoking history) with recurrent infections, and bronchitis. She is followed by Dr. Lake Bells.  Synopsis: Referred inJuly 2053for recurrent bronchitis/ sinusitis episodes ever since August 2015.;smoked 2 packs of cigarettes daily for 20 years, quit in 1992witha 40 pack year smoking history.  History of Present Illness: Pt. Was seen for a video visit 06/26/2018. She was seen for chronic sinusitis. She was treated with a z pack. Plan is for a sputum culture in 6-8 weeks, as long as she can remain off antibiotics.She is scheduled for a CT Sinus 07/03/2018 to evaluate if this may be the source of her chronic infections. She states she has completed the z pack and that she is feeling better. She states the pain she was having over her left eye has resolved. Secretions and post nasal gtt have improved.She has been using an over the counter decongestant, which she states has helped. She is using her Mucinex and saline with aloe rinses. She denies any fever, chest pain, orthopnea or hemoptysis.    Observations/Objective: Chest imaging: Chest x-ray from  August 2015 was independently reviewed showing possible emphysematous changes (increased AP diameter) with airway thickening in the right lower lobe notable 08/27/2017 HRCT images independently reviewed:Grossly normal pulmonary parenchyma, some mild centrilobular emphysema and airway thickening noted. Aortic atherosclerosis noted.  PFT: September 2019 ratio 80%, FEV1 2.10 L 110% predicted, total lung capacity 4.43 L 99% predicted, DLCO 15.92 mL 84% predicted  Labs: 2019 eosinophil count 0, IgE August 27th 2019 7 Spirometrywas performed and demonstrated an FEV1 of 1.71at 86% of predicted.  Results of blood testsobtained 06 January 2018 after receiving Pneumovax vaccination identified increase in antibody titers directed against multiple serotypes of pneumococcus. Her elevation in titers were somewhere around 2 fold at greatest for the majority of these elevations.   Assessment and Plan: Sinusitis Resolved after treatment with Z pack Plan Culture sputum once you have been off antibiotics x  6-8 weeks CT sinus 07/03/2018 as scheduled We will call you with results Continue over the counter decongestant Use as directed as needed Continue nasal irrigations with Saline with Aloe Continue Mucinex with a full glass of water Continue Flonase once daily as you have been doing. Follow up in July as is scheduled with Symphany Fleissner NP Call as needed for follow up.  Flu shot in the fall. Please contact office for sooner follow up if symptoms do not improve or worsen or seek emergency care   Continue to practice social distancing, wash hand frequently, and wear a face mask if you leave your home. Remember to utilize the early hours at grocery stores/ drug stores for people >102 years old or any underlying health  risks, as the stores are cleaner and less crowded at these times.    Follow Up Instructions: Follow up 08/24/2018 as is scheduled with Judson Roch NP Call if you need [place] sooner   I  discussed the assessment and treatment plan with the patient. The patient was provided an opportunity to ask questions and all were answered. The patient agreed with the plan and demonstrated an understanding of the instructions.   The patient was advised to call back or seek an in-person evaluation if the symptoms worsen or if the condition fails to improve as anticipated.  I provided 21 minutes of non-face-to-face time during this encounter.   [name], NP 06/30/2018 ]10:25 AM

## 2018-06-30 NOTE — Patient Instructions (Addendum)
It is good to talk with you today. I am so glad you are feeling better.  Culture sputum once you have been off antibiotics x  6-8 weeks CT sinus 07/03/2018 Continue over the counter decongestant Use as directed as needed Continue nasal irrigations with Saline with Aloe Continue Mucinex with a full glass of water Continue Flonase once daily as you have been doing. Follow up in July as is scheduled with Eytan Carrigan NP Call as needed for follow up.  Flu shot in the fall. Please contact office for sooner follow up if symptoms do not improve or worsen or seek emergency care   Continue to practice social distancing, wash hand frequently, and wear a face mask if you leave your home. Remember to utilize the early hours at grocery stores/ drug stores for people >41 years old or any underlying health risks, as the stores are cleaner and less crowded at these times.

## 2018-06-30 NOTE — Assessment & Plan Note (Signed)
Resolved after treatment with z pack Plan Culture sputum once you have been off antibiotics x  6-8 weeks CT sinus 07/03/2018 Continue over the counter decongestant Use as directed as needed Continue nasal irrigations with Saline with Aloe Continue Mucinex with a full glass of water Continue Flonase once daily as you have been doing. Follow up in July as is scheduled with Srikar Chiang NP Call as needed for follow up.  Flu shot in the fall. Please contact office for sooner follow up if symptoms do not improve or worsen or seek emergency care   Continue to practice social distancing, wash hand frequently, and wear a face mask if you leave your home. Remember to utilize the early hours at grocery stores/ drug stores for people >43 years old or any underlying health risks, as the stores are cleaner and less crowded at these times.

## 2018-07-02 ENCOUNTER — Telehealth: Payer: Self-pay | Admitting: *Deleted

## 2018-07-02 NOTE — Telephone Encounter (Signed)

## 2018-07-03 ENCOUNTER — Ambulatory Visit (INDEPENDENT_AMBULATORY_CARE_PROVIDER_SITE_OTHER)
Admission: RE | Admit: 2018-07-03 | Discharge: 2018-07-03 | Disposition: A | Discharge status: Home / Self Care | Payer: Medicare Other | Source: Ambulatory Visit | Attending: Acute Care | Admitting: Acute Care

## 2018-07-03 ENCOUNTER — Other Ambulatory Visit: Payer: Self-pay

## 2018-07-03 DIAGNOSIS — J321 Chronic frontal sinusitis: Secondary | ICD-10-CM

## 2018-07-03 DIAGNOSIS — J329 Chronic sinusitis, unspecified: Secondary | ICD-10-CM | POA: Diagnosis not present

## 2018-07-03 DIAGNOSIS — J40 Bronchitis, not specified as acute or chronic: Secondary | ICD-10-CM | POA: Diagnosis not present

## 2018-07-15 ENCOUNTER — Telehealth: Payer: Self-pay

## 2018-07-15 DIAGNOSIS — J321 Chronic frontal sinusitis: Secondary | ICD-10-CM

## 2018-07-15 NOTE — Telephone Encounter (Signed)
Please call patient. Let her know she needs to try a decongestant first, as she has only been off antibiotics x 2 weeks. I worry about chronic antibiotics and resistance. Set up a televisit with me next week so we can make sure she is feeling better. Have her call the office if she needs [place] sooner than next week. Thanks

## 2018-07-15 NOTE — Telephone Encounter (Signed)
I spoke with pt about her results and she advised me that she is still having sinus pressure and believes she is getting another sinus infection. She did explain that she did feel better since the last ABX that was prescribed on 06/26/2018 but feels like its heading to an infection again. I advised her that I would let Judson Roch know so we can see what the next steps will be. Will route to Sarah.

## 2018-07-16 NOTE — Telephone Encounter (Signed)
Called pt and advised message from the provider. Pt understood and verbalized understanding. She is requesting a referral to an ENT dictor. SG can we schedule?

## 2018-07-16 NOTE — Telephone Encounter (Signed)
Please refer to ENT. Thanks so much

## 2018-07-22 NOTE — Addendum Note (Signed)
Addended by: Jannette Spanner on: 07/22/2018 09:08 AM   Modules accepted: Orders

## 2018-07-22 NOTE — Telephone Encounter (Signed)
Referral placed. Nothing further is needed.  

## 2018-07-28 ENCOUNTER — Telehealth: Payer: Self-pay | Admitting: Allergy and Immunology

## 2018-07-28 NOTE — Telephone Encounter (Signed)
Ranitidine has been recalled Patient has an appt next week Can she take pepcid over the counter until then?? Does she take double the dosage because her ranitidine was double the OTC dosage?? Please call to answer any questions

## 2018-07-29 ENCOUNTER — Other Ambulatory Visit: Payer: Self-pay | Admitting: Allergy and Immunology

## 2018-07-29 MED ORDER — FAMOTIDINE 40 MG PO TABS: 40.0000 mg | ORAL_TABLET | Freq: Every evening | ORAL | 1 refills | Status: DC

## 2018-07-29 NOTE — Telephone Encounter (Signed)
Dr Kozlow please advise 

## 2018-07-29 NOTE — Telephone Encounter (Signed)
Sent in new prescription. Called patient and advised of change

## 2018-07-29 NOTE — Telephone Encounter (Signed)
Can use famotidine 40 mg in evening

## 2018-08-04 ENCOUNTER — Ambulatory Visit (INDEPENDENT_AMBULATORY_CARE_PROVIDER_SITE_OTHER): Payer: Medicare Other | Admitting: Allergy and Immunology

## 2018-08-04 ENCOUNTER — Other Ambulatory Visit: Payer: Self-pay

## 2018-08-04 ENCOUNTER — Encounter: Payer: Self-pay | Admitting: Allergy and Immunology

## 2018-08-04 VITALS — BP 122/80 | HR 96 | Temp 98.2°F | Resp 16

## 2018-08-04 DIAGNOSIS — J449 Chronic obstructive pulmonary disease, unspecified: Secondary | ICD-10-CM | POA: Diagnosis not present

## 2018-08-04 DIAGNOSIS — G500 Trigeminal neuralgia: Secondary | ICD-10-CM | POA: Diagnosis not present

## 2018-08-04 DIAGNOSIS — J3089 Other allergic rhinitis: Secondary | ICD-10-CM | POA: Diagnosis not present

## 2018-08-04 DIAGNOSIS — K219 Gastro-esophageal reflux disease without esophagitis: Secondary | ICD-10-CM

## 2018-08-04 DIAGNOSIS — J4489 Other specified chronic obstructive pulmonary disease: Secondary | ICD-10-CM

## 2018-08-04 NOTE — Patient Instructions (Addendum)
  1.  Continue to Treat and prevent inflammation:   A.  Flonase 1-2 sprays each nostril 1 time per day  B.  montelukast 10 mg tablet 1 time per day  C.  Symbicort 80-2 inhalations twice a day  2.  Continue to Treat and prevent reflux:   A.  Continue to consolidate caffeine and alcohol consumption  B.  Omeprazole 40 mg tablet in a.m.  C.  Famotidine 40 mg mg tablet in p.m.  3.  If needed:   A.  OTC antihistamine  B.  Nasal saline  C.  Proventil HFA or similar 2 inhalations every 4-6 hours  4. Treatment for facial pain syndrome?  5. Return to clinic in 3 months or earlier if problem.

## 2018-08-04 NOTE — Progress Notes (Signed)
[place]   Follow-up Note  Referring Provider: Marton Redwood, MD Primary Provider: Marton Redwood, MD Date of Office Visit: 08/04/2018  Subjective:   Pamela Larson (DOB: 27-Mar-1945) is a 73 y.o. female who returns to the [facility] [place] on 08/04/2018 in re-evaluation of the following:  HPI: Adelia returns to this clinic in reevaluation of LPR, emphysema from prolonged tobacco use, rhinitis, and history of recurrent infections.  Her last visit to this clinic was 03 March 2018.  Lucyle has had a very active spring with "respiratory tract infections".  She states that she has had sinus and she has had bronchitis.  Apparently in February 2020 she developed a "cold" and ever since that point in time she has been having problems with a respiratory tract that has required the administration of azithromycin on 4 different occasions.  It should be noted that besides for some occasional nasal congestion and sniffing and some coughing her major complaint is that she develops this left-sided facial pain.  She will develop facial pain that is below her eye and on her nasal bridge and sometimes supraorbital.  She gets such severe pain that if she touches the skin in that area it will elicit pain severe enough for her to take a hydrocodone.  Her last episode of this issue acutely developed on Friday, 4 days ago, for which she once again reinitiated use of a Z-Pak she had at home, and she had resolution of her pain within 48 hours.  She questions whether or not her chronic Lyme disease may be recurrent as apparently this is a cyclical phenomena for her lyme diseases comes back every 4 years requiring prolonged antibiotic administration with each exacerbation.  She does not think that her reflux issue is a particularly big issue at this point in time although it should be noted that she is not using her H2 receptor blocker because of the FDA directive  to stop ranitidine over the course of the past month or so.  She still continues on omeprazole.  She does not drink any caffeine at this point in time but still continues to have 2 to 3 ounces of scotch at nighttime.  She has had multiple inhalers given to her over the spring and she has settled on using Symbicort prescribed by pulmonology which she thinks is working quite well for her lungs.  Allergies as of 08/04/2018      Reactions   Amoxicillin Nausea And Vomiting   Has patient had a PCN reaction causing immediate rash, facial/tongue/throat swelling, SOB or lightheadedness with hypotension: #  #  #  YES  #  #  #  Has patient had a PCN reaction causing severe rash involving mucus membranes or skin necrosis: No Has patient had a PCN reaction that required hospitalization No Has patient had a PCN reaction occurring within the last 10 years: #  #  #  YES  #  #  #  If all of the above answers are "NO", then may proceed with Cephalosporin use.   Augmentin [amoxicillin-pot Clavulanate] Nausea And Vomiting   Demerol [meperidine Hcl] Nausea And Vomiting   Sulfa Antibiotics Nausea And Vomiting      Medication List    acetaminophen 500 MG tablet Commonly known as: TYLENOL Take 1,000 mg by mouth every 6 (six) hours as needed for mild pain. For pain   albuterol 108 (90 Base) MCG/ACT inhaler Commonly known as: VENTOLIN  HFA Inhale 2 puffs into the lungs every 6 (six) hours as needed for wheezing or shortness of breath. For shortness of breath   Aleve 220 MG Caps Generic drug: Naproxen Sodium Take by mouth.   ALPRAZolam 0.25 MG tablet Commonly known as: XANAX Take 0.125 mg by mouth daily as needed for anxiety. Pt takes 1/2 tablet as needed   budesonide-formoterol 80-4.5 MCG/ACT inhaler Commonly known as: Symbicort Inhale 2 puffs into the lungs 2 (two) times daily.   calcium carbonate 1250 (500 Ca) MG tablet Commonly known as: OS-CAL - dosed in mg of elemental calcium Take 1 tablet by  mouth daily.   famotidine 40 MG tablet Commonly known as: PEPCID TAKE 1 TABLET(40 MG) BY MOUTH EVERY EVENING   FLUoxetine 20 MG tablet Commonly known as: PROZAC Take 20 mg by mouth 2 (two) times daily.   fluticasone 50 MCG/ACT nasal spray Commonly known as: FLONASE Place 1 spray into both nostrils daily as needed for allergies or rhinitis   levothyroxine 100 MCG tablet Commonly known as: SYNTHROID Take 50-100 mcg by mouth See admin instructions. TAKES 100 MCG DAILY EXCEPT ON THURSDAYS AND TAKES 50 MCG   Melatonin 3 MG Caps Take 3 mg by mouth at bedtime as needed (SLEEP).   montelukast 10 MG tablet Commonly known as: SINGULAIR TAKE 1 TABLET(10 MG) BY MOUTH AT BEDTIME   omeprazole 40 MG capsule Commonly known as: PRILOSEC TAKE 1 CAPSULE(40 MG) BY MOUTH DAILY   rosuvastatin 10 MG tablet Commonly known as: CRESTOR   Sudafed 30 MG tablet Generic drug: pseudoephedrine Take 30 mg by mouth every 4 (four) hours as needed for congestion.   Vitamin D (Ergocalciferol) 1.25 MG (50000 UT) Caps capsule Commonly known as: DRISDOL Take 50,000 Units by mouth every 7 (seven) days. On Mondays       Past Medical History:  Diagnosis Date  . Anemia    pernicious anemia - 1991   . Anxiety   . Arthritis    rheumatoid arthritis   . Chronic fatigue   . Cystitis, interstitial   . Depression   . Disc disorder of lumbar region 2013  . Fibromyalgia   . GERD (gastroesophageal reflux disease)   . History of kidney stones   . Hypothyroidism   . Lyme disease    hx of possible   . 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 Osteoporosis   . Pneumonia    hx of   . Rheumatoid arthritis [facility])     Past Surgical History:  Procedure Laterality Date  . APPENDECTOMY  1978  . bladder hydrodistention  1998  . BREAST SURGERY     reduction   . CERVICAL DISC ARTHROPLASTY N/A 04/03/2016   Procedure: CERVICAL SIX CERVICAL SEVEN [place] ARTHROPLASTY;   Surgeon: Jovita Gamma, MD;  Location: [facility];  Service: Neurosurgery;  Laterality: N/A;  left side approach  . COSMETIC SURGERY  2005   face lift  . EXCISION MORTON'S NEUROMA Left 05/08/2017   Procedure: EXCISION MORTON'S NEUROMA Left 3rd webspace;  Surgeon: [name], MD;  Location: [facility];  Service: Orthopedics;  Laterality: Left;  . EYE SURGERY    . LUMBAR LAMINECTOMY/DECOMPRESSION MICRODISCECTOMY  06/10/2011   Procedure: LUMBAR LAMINECTOMY/DECOMPRESSION MICRODISCECTOMY 1 LEVEL;  Surgeon: Hosie Spangle, MD;  Location: [facility];  Service: Neurosurgery;  Laterality: Left;  Lumbar Five Sacral One Lumbar Laminectomy Decompression Microdiscectomy     Review of systems  negative except as noted in HPI / PMHx or noted below:  Review of Systems  Constitutional: Negative.   HENT: Negative.   Eyes: Negative.   Respiratory: Negative.   Cardiovascular: Negative.   Gastrointestinal: Negative.   Genitourinary: Negative.   Musculoskeletal: Negative.   Skin: Negative.   Neurological: Negative.   Endo/Heme/Allergies: Negative.   Psychiatric/Behavioral: Negative.      Objective:   Vitals:   08/04/18 1409  BP: 122/80  Pulse: 96  Resp: 16  Temp: 98.2 F (36.8 C)  SpO2: 95%          Physical Exam Constitutional:      Appearance: She is not diaphoretic.  HENT:     Head: Normocephalic.     Right Ear: Tympanic membrane, ear canal and external ear normal.     Left Ear: Tympanic membrane, ear canal and external ear normal.     Nose: Nose normal. No mucosal edema or rhinorrhea.     Mouth/Throat:     Pharynx: Uvula midline. No oropharyngeal exudate.  Eyes:     Conjunctiva/sclera: Conjunctivae normal.  Neck:     Thyroid: No thyromegaly.     Trachea: Trachea normal. No tracheal tenderness or tracheal deviation.  Cardiovascular:     Rate and Rhythm: Normal rate and regular rhythm.     Heart sounds: Normal heart sounds, S1 normal and S2 normal. No murmur.   Pulmonary:     Effort: No respiratory distress.     Breath sounds: Normal breath sounds. No stridor. No wheezing or rales.  Lymphadenopathy:     Head:     Right side of head: No tonsillar adenopathy.     Left side of head: No tonsillar adenopathy.     Cervical: No cervical adenopathy.  Skin:    Findings: No erythema or rash.     Nails: There is no clubbing.   Neurological:     Mental Status: She is alert.     Diagnostics:    Spirometry was performed and demonstrated an FEV1 of 1.98 at 101 % of predicted.  Results of a sinus CT scan obtained 03 Jul 2018 identified the following:  The maxillary, sphenoid, ethmoid, and frontal sinuses are clear. No nasal cavity masses. Nasal septum is sigmoid shaped, essentially midline.  Assessment and Plan:   1. COPD with chronic bronchitis and emphysema ([place])   2. LPRD (laryngopharyngeal reflux disease)   3. Perennial allergic rhinitis   4. Facial pain syndrome     1.  Continue to Treat and prevent inflammation:   A.  Flonase 1-2 sprays each nostril 1 time per day  B.  montelukast 10 mg tablet 1 time per day  C.  Symbicort 80-2 inhalations twice a day  2.  Continue to Treat and prevent reflux:   A.  Continue to consolidate caffeine and alcohol consumption  B.  Omeprazole 40 mg tablet in a.m.  C.  Famotidine 40 mg mg tablet in p.m.  3.  If needed:   A.  OTC antihistamine  B.  Nasal saline  C.  Proventil HFA or similar 2 inhalations every 4-6 hours  4. Treatment for facial pain syndrome?  5. Return to clinic in 3 months or earlier if problem.    Mahira very well may be having some degree of inflammation and infection affecting her respiratory track over the course of the past spring but 1 of her major issues is a facial pain syndrome affecting the left side of her face severe enough  for her to take occasional hydrocodone.  I do not know the etiology of this left facial pain syndrome but it may be migraine or trigeminal neuralgia  or some other type of neuropathy.  I do not think that her "chronic Lyme disease" is playing any role in this issue at all.  For now we will just see what happens as she moves forward regarding the intensity and frequency of these episodes.  Obviously she cannot remain with recurrent facial pain syndrome if this is a recurrent issue.  She may need further evaluation for possible migraine or trigeminal neuralgia.  She will continue on therapy directed against inflammation of her airway and therapy directed against reflux as noted above.  I will see her back in this clinic in 3 months or earlier if there is a problem.  Allena Katz, MD Allergy / Immunology [place]

## 2018-08-05 ENCOUNTER — Encounter: Payer: Self-pay | Admitting: Allergy and Immunology

## 2018-08-24 ENCOUNTER — Other Ambulatory Visit: Payer: Self-pay

## 2018-08-24 ENCOUNTER — Ambulatory Visit (INDEPENDENT_AMBULATORY_CARE_PROVIDER_SITE_OTHER): Payer: Medicare Other | Admitting: Acute Care

## 2018-08-24 ENCOUNTER — Encounter: Payer: Self-pay | Admitting: Acute Care

## 2018-08-24 DIAGNOSIS — J309 Allergic rhinitis, unspecified: Secondary | ICD-10-CM | POA: Diagnosis not present

## 2018-08-24 DIAGNOSIS — J449 Chronic obstructive pulmonary disease, unspecified: Secondary | ICD-10-CM | POA: Diagnosis not present

## 2018-08-24 DIAGNOSIS — J0110 Acute frontal sinusitis, unspecified: Secondary | ICD-10-CM | POA: Diagnosis not present

## 2018-08-24 NOTE — Assessment & Plan Note (Addendum)
Sinusitis It's good to see you today. Continue Singulair 10 mg daily. Continue Symbicort daily 2 puffs twice daily Follow up in 11/2018  as is scheduled with Judson Roch NP Call as needed for follow up.  Flu shot in the fall. Please contact office for sooner follow up if symptoms do not improve or worsen or seek emergency care  COPD with chronic bronchitis and emphysema ([place]) Continue Symbicort daily as you have been doing. Continue your rescue inhaler as needed.for shortness of breath or wheezing. Follow up 11/2018 Please contact office for sooner follow up if symptoms do not improve or worsen or seek emergency care

## 2018-08-24 NOTE — Patient Instructions (Addendum)
It's good to see you today. We will send you home with a sputum collection cup and directions on how to collect your sputum.  Wait until the beginning of August to collect your sputum.  We will send a culture of your sputum ( Culture, AFB, Fungal)  Your CT Sinus was normal Continue over the counter decongestant Use as directed as needed Continue nasal irrigations with Saline with Aloe Continue Mucinex with a full glass of water Continue Flonase once daily as you have been doing. Continue Singulair 10 mg daily. Continue Sudafed daily as you have been doing. Continue Symbicort daily 2 puffs twice daily Follow up in 11/2018  as is scheduled with Judson Roch NP Follow up with Dr. Warren Danes as you have scheduled Call as needed for follow up.  Flu shot in the fall. Please contact office for sooner follow up if symptoms do not improve or worsen or seek emergency care  Continue topractice social distancing, wash hand frequently, and wear a face mask if you leave your home. Remember to utilize the early hours at grocery stores/ drug stores for people >78 years old or any underlying health risks, as the stores are cleaner and less crowded at these times.

## 2018-08-24 NOTE — Assessment & Plan Note (Signed)
Continue Symbicort daily as you have been doing. Continue your rescue inhaler as needed.for shortness of breath or wheezing. Follow up 11/2018 Please contact office for sooner follow up if symptoms do not improve or worsen or seek emergency care
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
## 2018-08-24 NOTE — Assessment & Plan Note (Signed)
It's good to see you today. We will send you home with a sputum collection cup and directions on how to collect your sputum.  Wait until the beginning of August to collect your sputum.  We will send a culture of your sputum ( Culture, AFB, Fungal)  Your CT Sinus was normal Continue over the counter decongestant Use as directed as needed Continue nasal irrigations with Saline with Aloe Continue Mucinex with a full glass of water Continue Flonase once daily as you have been doing. Continue Singulair 10 mg daily. Continue Symbicort daily 2 puffs twice daily Follow up in 11/2018  as is scheduled with Judson Roch NP Call as needed for follow up.  Flu shot in the fall. Please contact office for sooner follow up if symptoms do not improve or worsen or seek emergency care

## 2018-08-25 NOTE — Progress Notes (Signed)
Reviewed, agree 

## 2018-09-08 DIAGNOSIS — J310 Chronic rhinitis: Secondary | ICD-10-CM | POA: Diagnosis not present

## 2018-09-28 ENCOUNTER — Encounter: Payer: Self-pay | Admitting: Physician Assistant

## 2018-10-12 ENCOUNTER — Ambulatory Visit (INDEPENDENT_AMBULATORY_CARE_PROVIDER_SITE_OTHER): Payer: Medicare Other | Admitting: Physician Assistant

## 2018-10-12 ENCOUNTER — Encounter: Payer: Self-pay | Admitting: Physician Assistant

## 2018-10-12 VITALS — BP 144/78 | HR 80 | Temp 97.9°F | Ht 60.0 in | Wt 105.2 lb

## 2018-10-12 DIAGNOSIS — K625 Hemorrhage of anus and rectum: Secondary | ICD-10-CM | POA: Diagnosis not present

## 2018-10-12 DIAGNOSIS — Z800 Family history of malignant neoplasm of digestive organs: Secondary | ICD-10-CM | POA: Diagnosis not present

## 2018-10-12 MED ORDER — NA SULFATE-K SULFATE-MG SULF 17.5-3.13-1.6 GM/177ML PO SOLN: 1.0000 | ORAL | 0 refills | Status: DC

## 2018-10-12 NOTE — Patient Instructions (Signed)
You have been scheduled for a colonoscopy. Please follow written instructions given to you at your visit today.  Please pick up your prep supplies at the pharmacy within the next 1-3 days. If you use inhalers (even only as needed), please bring them with you on the day of your procedure.   

## 2018-10-12 NOTE — Progress Notes (Signed)
Chief Complaint: Rectal bleeding, family history of colon cancer  HPI:    Pamela Larson is a 73 year old Caucasian female with a past medical history as listed below including family history of colon cancer in her mother at the age of 71, known to Dr. Henrene Pastor, who presents to clinic today with a complaint of an episode of rectal bleeding and explosive diarrhea.    06/02/2012 colonoscopy due to family history of colon cancer in her mother in her 71s, at that time 3 polyps in the cecum, ascending colon and transverse colon, otherwise normal.  Repeat recommended in 5 years.    Today, the patient presents to clinic and explains that about 2 weeks ago she had to hold in a bowel movement for over an hour and when she was able to have her bowel movement she had profuse explosive diarrhea with a lot of gas.  She felt like she may have some leakage afterwards so she put some toilet paper in her underwear.  A few hours later she sat down on the floor and felt as though her pants were wet.  She had a small amount of further diarrhea which looked like a mixture of pus and liquid stool tinged with blood.  This lasted for about an hour and then stopped.  Since then she has been completely normal with no further diarrhea, rectal pain, blood or any other symptoms.    Denies fever, chills, weight loss, anorexia, nausea, vomiting, heartburn, reflux or symptoms that awaken her from sleep.  Past Medical History:  Diagnosis Date  . Anemia    pernicious anemia - 1991   . Anxiety   . Arthritis    rheumatoid arthritis   . Chronic fatigue   . Cystitis, interstitial   . Depression   . Disc disorder of lumbar region 2013  . Fibromyalgia   . GERD (gastroesophageal reflux disease)   . History of kidney stones   . Hypothyroidism   . Lyme disease    hx of possible   . 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 Osteoporosis   . Pneumonia    hx of   . Rheumatoid arthritis  [facility])     Past Surgical History:  Procedure Laterality Date  . APPENDECTOMY  1978  . bladder hydrodistention  1998  . BREAST SURGERY     reduction   . CERVICAL DISC ARTHROPLASTY N/A 04/03/2016   Procedure: CERVICAL SIX CERVICAL SEVEN [place] ARTHROPLASTY;  Surgeon: Jovita Gamma, MD;  Location: [facility];  Service: Neurosurgery;  Laterality: N/A;  left side approach  . COSMETIC SURGERY  2005   face lift  . EXCISION MORTON'S NEUROMA Left 05/08/2017   Procedure: EXCISION MORTON'S NEUROMA Left 3rd webspace;  Surgeon: [name], MD;  Location: [facility];  Service: Orthopedics;  Laterality: Left;  . EYE SURGERY     lower eye lids  . LUMBAR LAMINECTOMY/DECOMPRESSION MICRODISCECTOMY  06/10/2011   Procedure: LUMBAR LAMINECTOMY/DECOMPRESSION MICRODISCECTOMY 1 LEVEL;  Surgeon: Hosie Spangle, MD;  Location: [facility];  Service: Neurosurgery;  Laterality: Left;  Lumbar Five Sacral One Lumbar Laminectomy Decompression Microdiscectomy     Current Outpatient Medications  Medication Sig Dispense Refill  . acetaminophen (TYLENOL) 500 MG tablet Take 1,000 mg by mouth every 6 (six) hours as needed for mild pain. For pain    . albuterol (PROVENTIL HFA;VENTOLIN HFA) 108 (90 Base) MCG/ACT inhaler Inhale 2 puffs into the lungs  every 6 (six) hours as needed for wheezing or shortness of breath. For shortness of breath 1 Inhaler 1  . ALPRAZolam (XANAX) 0.25 MG tablet Take 0.125 mg by mouth daily as needed for anxiety. Pt takes 1/2 tablet as needed     . budesonide-formoterol (SYMBICORT) 80-4.5 MCG/ACT inhaler Inhale 2 puffs into the lungs 2 (two) times daily. 1 Inhaler 11  . calcium carbonate (OS-CAL - DOSED IN MG OF ELEMENTAL CALCIUM) 1250 MG tablet Take 1 tablet by mouth daily.    . famotidine (PEPCID) 40 MG tablet TAKE 1 TABLET(40 MG) BY MOUTH EVERY EVENING 90 tablet 0  . FLUoxetine (PROZAC) 20 MG tablet Take 20 mg by mouth 2 (two) times daily.    . fluticasone (FLONASE) 50 MCG/ACT nasal  spray Place 1 spray into both nostrils daily as needed for allergies or rhinitis.    Marland Kitchen levothyroxine (SYNTHROID, LEVOTHROID) 100 MCG tablet Take 50-100 mcg by mouth See admin instructions. TAKES 100 MCG DAILY EXCEPT ON THURSDAYS AND TAKES 50 MCG    . Melatonin 3 MG CAPS Take 3 mg by mouth at bedtime as needed (SLEEP).    . montelukast (SINGULAIR) 10 MG tablet TAKE 1 TABLET(10 MG) BY MOUTH AT BEDTIME 30 tablet 5  . Naproxen Sodium (ALEVE) 220 MG CAPS Take by mouth.    Marland Kitchen omeprazole (PRILOSEC) 40 MG capsule TAKE 1 CAPSULE(40 MG) BY MOUTH DAILY 90 capsule 1  . pseudoephedrine (SUDAFED) 30 MG tablet Take 30 mg by mouth every 4 (four) hours as needed for congestion.    . rosuvastatin (CRESTOR) 10 MG tablet     . Vitamin D, Ergocalciferol, (DRISDOL) 50000 UNITS CAPS Take 50,000 Units by mouth every 7 (seven) days. On Mondays     No current facility-administered medications for this visit.     Allergies as of 10/12/2018 - Review Complete 10/12/2018  Allergen Reaction Noted  . Amoxicillin Nausea And Vomiting 01/12/2015  . Augmentin [amoxicillin-pot clavulanate] Nausea And Vomiting 06/06/2011  . Demerol [meperidine hcl] Nausea And Vomiting 06/06/2011  . Sulfa antibiotics Nausea And Vomiting 06/05/2011    Family History  Problem Relation Age of Onset  . Breast cancer Mother   . Colon cancer Mother        colorectal  . Pancreatic cancer Mother   . Cancer Maternal Grandmother        abdominal  . Lung cancer Other        maternal great aunt    Social History   Socioeconomic History  . Marital status: Single    Spouse name: Not on file  . Number of children: 1  . Years of education: Not on file  . Highest education level: Not on file  Occupational History  . Occupation: retired Futures trader  Social Needs  . Financial resource strain: Not on file  . Food insecurity    Worry: Not on file    Inability: Not on file  . Transportation needs    Medical: Not on file    Non-medical:  Not on file  Tobacco Use  . Smoking status: Former Smoker    Packs/day: 2.00    Years: 20.00    Pack years: 40.00    Types: Cigarettes    Quit date: 02/11/1990    Years since quitting: 28.6  . Smokeless tobacco: Never Used  Substance and Sexual Activity  . Alcohol use: Yes    Alcohol/week: 14.0 standard drinks    Types: 14 Standard drinks or equivalent per week  Comment: drink per nite scotch  . Drug use: No  . Sexual activity: Never    Partners: Male  Lifestyle  . Physical activity    Days per week: Not on file    Minutes per session: Not on file  . Stress: Not on file  Relationships  . Social Herbalist on phone: Not on file    Gets together: Not on file    Attends religious service: Not on file    Active member of club or organization: Not on file    Attends meetings of clubs or organizations: Not on file    Relationship status: Not on file  . Intimate partner violence    Fear of current or ex partner: Not on file    Emotionally abused: Not on file    Physically abused: Not on file    Forced sexual activity: Not on file  Other Topics Concern  . Not on file  Social History Narrative  . Not on file    Review of Systems:    Constitutional: No weight loss, fever or chills Skin: No rash  Cardiovascular: No chest pain Respiratory: No SOB  Gastrointestinal: See HPI and otherwise negative Genitourinary: No dysuria  Neurological: No headache, dizziness or syncope Musculoskeletal: No new muscle or joint pain Hematologic: No bruising Psychiatric: No history of depression or anxiety   Physical Exam:  Vital signs: BP (!) 144/78 (BP Location: Left Arm, Patient Position: Sitting, Cuff Size: Normal)   Pulse 80   Temp 97.9 F (36.6 C)   Ht 5' (1.524 m) Comment: height measured without shoes  Wt 105 lb 4 oz (47.7 kg)   BMI 20.56 kg/m   Constitutional:   Pleasant Caucasian female appears to be in NAD, Well developed, Well nourished, alert and cooperative  Head:  Normocephalic and atraumatic. Eyes:   PEERL, EOMI. No icterus. Conjunctiva pink. Ears:  Normal auditory acuity. Neck:  Supple Throat: No external swelling Respiratory: Respirations even and unlabored. Lungs clear to auscultation bilaterally.   No wheezes, crackles, or rhonchi.  Cardiovascular: Normal S1, S2. No MRG. Regular rate and rhythm. No peripheral edema, cyanosis or pallor.  Gastrointestinal:  Soft, nondistended, nontender. No rebound or guarding. Normal bowel sounds. No appreciable masses or hepatomegaly. Rectal: declined Msk:  Symmetrical without gross deformities. Without edema, no deformity or joint abnormality.  Neurologic:  Alert and  oriented x4;  grossly normal neurologically.  Skin:   Dry and intact without significant lesions or rashes. Psychiatric: Demonstrates good judgement and reason without abnormal affect or behaviors.  No recent labs or imaging.  Assessment: 1.  Family history of colon cancer: In her mother in her 41s, patient's last colonoscopy 6 years ago with polyps, repeat recommended in 5 years at that time 2.  History of colon polyps 3.  Rectal bleeding: 1 episode 2 weeks ago, none since; most likely hemorrhoids but must r/o malignancy given family hx  Plan: 1.  Scheduled patient for diagnostic colonoscopy due to her recent rectal bleeding.  Scheduled this with Dr. Henrene Pastor in the [facility].  Did discuss risks, benefits, limitations and alternatives and patient agrees to proceed. 2.  Patient to follow in clinic per recommendations from Dr. Henrene Pastor after time of procedure.  Ellouise Newer, PA-C [facility] 10/12/2018, 11:48 AM  Cc: Marton Redwood, MD

## 2018-10-12 NOTE — Progress Notes (Signed)
Assessment and plan reviewed 

## 2018-10-16 ENCOUNTER — Other Ambulatory Visit: Payer: Self-pay | Admitting: Allergy and Immunology

## 2018-10-22 ENCOUNTER — Telehealth: Payer: Self-pay

## 2018-10-22 NOTE — Telephone Encounter (Signed)
Covid-19 screening questions   Do you now or have you had a fever in the last 14 days? NO   Do you have any respiratory symptoms of shortness of breath or cough now or in the last 14 days? NO  Do you have any family members or close contacts with diagnosed or suspected Covid-19 in the past 14 days? NO  Have you been tested for Covid-19 and found to be positive? NO        

## 2018-10-23 ENCOUNTER — Encounter: Payer: Self-pay | Admitting: Internal Medicine

## 2018-10-23 ENCOUNTER — Other Ambulatory Visit: Payer: Self-pay

## 2018-10-23 ENCOUNTER — Ambulatory Visit (AMBULATORY_SURGERY_CENTER): Payer: Medicare Other | Admitting: Internal Medicine

## 2018-10-23 VITALS — BP 108/54 | HR 68 | Temp 98.5°F | Resp 16 | Ht 60.0 in | Wt 105.0 lb

## 2018-10-23 DIAGNOSIS — Z800 Family history of malignant neoplasm of digestive organs: Secondary | ICD-10-CM

## 2018-10-23 DIAGNOSIS — D123 Benign neoplasm of transverse colon: Secondary | ICD-10-CM | POA: Diagnosis not present

## 2018-10-23 DIAGNOSIS — K625 Hemorrhage of anus and rectum: Secondary | ICD-10-CM

## 2018-10-23 MED ORDER — SODIUM CHLORIDE 0.9 % IV SOLN: 500.0000 mL | Freq: Once | INTRAVENOUS | Status: DC

## 2018-10-23 NOTE — Progress Notes (Signed)
Pt's states no medical or surgical changes since previsit or office visit.  June Bullock-temp Courtney [place] - vitals 

## 2018-10-23 NOTE — Patient Instructions (Signed)
Handout on polyps, and diverticulosis given   YOU HAD AN ENDOSCOPIC PROCEDURE TODAY AT [facility]:   Refer to the procedure report that was given to you for any specific questions about what was found during the examination.  If the procedure report does not answer your questions, please call your gastroenterologist to clarify.  If you requested that your care partner not be given the details of your procedure findings, then the procedure report has been included in a sealed envelope for you to review at your convenience later.  YOU SHOULD EXPECT: Some feelings of bloating in the abdomen. Passage of more gas than usual.  Walking can help get rid of the air that was put into your GI tract during the procedure and reduce the bloating. If you had a lower endoscopy (such as a colonoscopy or flexible sigmoidoscopy) you may notice spotting of blood in your stool or on the toilet paper. If you underwent a bowel prep for your procedure, you may not have a normal bowel movement for a few days.  Please Note:  You might notice some irritation and congestion in your nose or some drainage.  This is from the oxygen used during your procedure.  There is no need for concern and it should clear up in a day or so.  SYMPTOMS TO REPORT IMMEDIATELY:   Following lower endoscopy (colonoscopy or flexible sigmoidoscopy):  Excessive amounts of blood in the stool  Significant tenderness or worsening of abdominal pains  Swelling of the abdomen that is new, acute  Fever of 100F or higher   For urgent or emergent issues, a gastroenterologist can be reached at any hour by calling [phone].   DIET:  We do recommend a small meal at first, but then you may proceed to your regular diet.  Drink plenty of fluids but you should avoid alcoholic beverages for 24 hours.  ACTIVITY:  You should plan to take it easy for the rest of today and you should NOT DRIVE or use heavy machinery until tomorrow (because of  the sedation medicines used during the test).    FOLLOW UP: Our staff will call the number listed on your records 48-72 hours following your procedure to check on you and address any questions or concerns that you may have regarding the information given to you following your procedure. If we do not reach you, we will leave a message.  We will attempt to reach you two times.  During this call, we will ask if you have developed any symptoms of COVID 19. If you develop any symptoms (ie: fever, flu-like symptoms, shortness of breath, cough etc.) before then, please call [phone].  If you test positive for Covid 19 in the 2 weeks post procedure, please call and report this information to [place].    If any biopsies were taken you will be contacted by phone or by letter within the next 1-3 weeks.  Please call us at [phone] if you have not heard about the biopsies in 3 weeks.    SIGNATURES/CONFIDENTIALITY: You and/or your care partner have signed paperwork which will be entered into your electronic medical record.  These signatures attest to the fact that that the information above on your After Visit Summary has been reviewed and is understood.  Full responsibility of the confidentiality of this discharge information lies with you and/or your care-partner.

## 2018-10-23 NOTE — Progress Notes (Signed)
Called to room to assist during endoscopic procedure.  Patient ID and intended procedure confirmed with present staff. Received instructions for my participation in the procedure from the performing physician.  

## 2018-10-23 NOTE — Op Note (Signed)
[facility] Patient Name: Pamela Larson Procedure Date: 10/23/2018 12:20 PM MRN: [id number] Endoscopist: Docia Chuck. Henrene Pastor , MD Age: 73 Referring MD:  Date of Birth: 12-16-1945 Gender: Female Account #: [id number] Procedure:                Colonoscopy with cold snare polypectomy x 1 Indications:              High risk colon cancer surveillance: Personal                            history of multiple (3 or more) adenomas. And                            family history of colon cancer in mother and 11s.                            Multiple prior colonoscopies at [facility] and most                            recent examination here April 2014 (3 adenomas).                            Now for follow-up after having had an isolated bout                            of explosive diarrhea and rectal bleeding. Since                            resolved Medicines:                Monitored Anesthesia Care Procedure:                Pre-Anesthesia Assessment:                           - Prior to the procedure, a History and Physical                            was performed, and patient medications and                            allergies were reviewed. The patient's tolerance of                            previous anesthesia was also reviewed. The risks                            and benefits of the procedure and the sedation                            options and risks were discussed with the patient.                            All questions were answered, and informed consent  was obtained. Prior Anticoagulants: The patient has                            taken no previous anticoagulant or antiplatelet                            agents. ASA Grade Assessment: II - A patient with                            mild systemic disease. After reviewing the risks                            and benefits, the patient was deemed in                            satisfactory condition to  undergo the procedure.                           After obtaining informed consent, the colonoscope                            was passed under direct vision. Throughout the                            procedure, the patient's blood pressure, pulse, and                            oxygen saturations were monitored continuously. The                            Colonoscope was introduced through the anus and                            advanced to the the cecum, identified by                            appendiceal orifice and ileocecal valve. The                            ileocecal valve, appendiceal orifice, and rectum                            were photographed. The quality of the bowel                            preparation was excellent. The colonoscopy was                            performed without difficulty. The patient tolerated                            the procedure well. The bowel preparation used was  SUPREP via split dose instruction. Scope In: 12:35:00 PM Scope Out: 12:44:55 PM Scope Withdrawal Time: 0 hours 7 minutes 9 seconds  Total Procedure Duration: 0 hours 9 minutes 55 seconds  Findings:                 A 5 mm polyp was found in the transverse colon. The                            polyp was sessile. The polyp was removed with a                            cold snare. Resection and retrieval were complete.                           Isolated diverticulum in the left colon. The entire                            examined colon appeared otherwise normal on direct                            and retroflexion views. Complications:            No immediate complications. Estimated blood loss:                            None. Estimated Blood Loss:     Estimated blood loss: none. Impression:               - One 5 mm polyp in the transverse colon, removed                            with a cold snare. Resected and retrieved.                            -Isolated diverticulum left colon. The entire                            examined colon is otherwise normal on direct and                            retroflexion views. Recommendation:           - Repeat colonoscopy in 5 years for surveillance.                           - Patient has a contact number available for                            emergencies. The signs and symptoms of potential                            delayed complications were discussed with the                            patient. Return to normal activities tomorrow.  Written discharge instructions were provided to the                            patient.                           - Resume previous diet.                           - Continue present medications.                           - Await pathology results. Docia Chuck. Henrene Pastor, MD 10/23/2018 12:54:14 PM This report has been signed electronically.

## 2018-10-23 NOTE — Progress Notes (Signed)
PT taken to PACU. Monitors in place. VSS. Report given to RN. 

## 2018-10-27 ENCOUNTER — Telehealth: Payer: Self-pay | Admitting: *Deleted

## 2018-10-27 NOTE — Telephone Encounter (Signed)
1. Have you developed a fever since your procedure? no  2.   Have you had an respiratory symptoms (SOB or cough) since your procedure? no  3.   Have you tested positive for COVID 19 since your procedure no  4.   Have you had any family members/close contacts diagnosed with the COVID 19 since your procedure?  no   If yes to any of these questions please route to Joylene John, RN and Alphonsa Gin, Therapist, sports.     Follow up Call-  Call back number 10/23/2018  Post procedure Call Back phone  # [phone]  Permission to leave phone message Yes  Some recent data might be hidden     Patient questions:  Do you have a fever, pain , or abdominal swelling? No. Pain Score  0 *   Have you tolerated food without any problems? Yes.    Have you been able to return to your normal activities? Yes.    Do you have any questions about your discharge instructions: Diet   No. Medications  No. Follow up visit  No.  Do you have questions or concerns about your Care? No.  Actions: * If pain score is 4 or above: No action needed, pain <4.

## 2018-10-28 ENCOUNTER — Encounter: Payer: Self-pay | Admitting: Internal Medicine

## 2018-10-29 DIAGNOSIS — M545 Low back pain: Secondary | ICD-10-CM | POA: Diagnosis not present

## 2018-10-29 DIAGNOSIS — D510 Vitamin B12 deficiency anemia due to intrinsic factor deficiency: Secondary | ICD-10-CM | POA: Diagnosis not present

## 2018-10-29 DIAGNOSIS — W1849XA Other slipping, tripping and stumbling without falling, initial encounter: Secondary | ICD-10-CM | POA: Diagnosis not present

## 2018-11-03 ENCOUNTER — Ambulatory Visit (INDEPENDENT_AMBULATORY_CARE_PROVIDER_SITE_OTHER): Payer: Medicare Other | Admitting: Allergy and Immunology

## 2018-11-03 ENCOUNTER — Encounter: Payer: Self-pay | Admitting: Allergy and Immunology

## 2018-11-03 ENCOUNTER — Other Ambulatory Visit: Payer: Self-pay

## 2018-11-03 VITALS — BP 148/84 | HR 74 | Temp 98.1°F | Resp 16 | Ht 60.0 in | Wt 105.8 lb

## 2018-11-03 DIAGNOSIS — J4540 Moderate persistent asthma, uncomplicated: Secondary | ICD-10-CM | POA: Diagnosis not present

## 2018-11-03 DIAGNOSIS — J439 Emphysema, unspecified: Secondary | ICD-10-CM

## 2018-11-03 DIAGNOSIS — J3089 Other allergic rhinitis: Secondary | ICD-10-CM

## 2018-11-03 DIAGNOSIS — K219 Gastro-esophageal reflux disease without esophagitis: Secondary | ICD-10-CM

## 2018-11-03 DIAGNOSIS — G500 Trigeminal neuralgia: Secondary | ICD-10-CM

## 2018-11-03 NOTE — Patient Instructions (Signed)
  1.  Continue to Treat and prevent inflammation:   A.  Flonase 1-2 sprays each nostril 1 time per day  B.  montelukast 10 mg tablet 1 time per day  C.  Symbicort 80-2 inhalations 1-2 times a day  2.  Continue to Treat and prevent reflux:   A.  Continue to consolidate caffeine and alcohol consumption  B.  Omeprazole 40 mg tablet in a.m.  C.  Famotidine 40 mg mg tablet in p.m.  3.  If needed:   A.  OTC antihistamine  B.  Nasal saline  C.  Proventil HFA or similar 2 inhalations every 4-6 hours  4. Obtain fall flu vaccine (and COVID vaccine)  5. Return to clinic in 3 months or earlier if problem.

## 2018-11-03 NOTE — Progress Notes (Signed)
[facility]   Follow-up Note  Referring Provider: Marton Redwood, MD Primary Provider: Marton Redwood, MD Date of Office Visit: 11/03/2018  Subjective:   Pamela Larson (DOB: 28-Nov-1945) is a 73 y.o. female who returns to the [facility] on 11/03/2018 in re-evaluation of the following:  HPI: Pamela Larson returns to this clinic in evaluation of LPR, emphysema from prolonged tobacco use and possible component of asthma, rhinitis, and a history of left-sided facial pain syndrome.  I last saw her in this clinic on 04 August 2018.  She has had no issues with her airway and does not use a short acting bronchodilator and can exert herself without any problem.  She continues on Symbicort on a regular basis.  She continues on a nasal steroid and montelukast and has had no problems with her nose.  She has not required a systemic steroid or antibiotic for any type of airway issue.  Fortunately, her left-sided facial pain syndrome has resolved since the last visit with me in this clinic.  She did follow-up with Dr. Erik Obey on 28 July [id number] but at that point in time she did not have any pain.  Reflux has not been an issue and she has not had any significant problems with her throat.  She continues to drink 2 cocktails at nighttime but has eliminated all caffeine consumption and has utilizing a combination of a proton pump inhibitor and H2 receptor blocker.  Allergies as of 11/03/2018      Reactions   Amoxicillin Nausea And Vomiting   Has patient had a PCN reaction causing immediate rash, facial/tongue/throat swelling, SOB or lightheadedness with hypotension: #  #  #  YES  #  #  #  Has patient had a PCN reaction causing severe rash involving mucus membranes or skin necrosis: No Has patient had a PCN reaction that required hospitalization No Has patient had a PCN reaction occurring within the last 10 years: #  #  #  YES  #  #  #  If all of the above  answers are "NO", then may proceed with Cephalosporin use.   Augmentin [amoxicillin-pot Clavulanate] Nausea And Vomiting   Demerol [meperidine Hcl] Nausea And Vomiting   Sulfa Antibiotics Nausea And Vomiting      Medication List      acetaminophen 500 MG tablet Commonly known as: TYLENOL Take 1,000 mg by mouth every 6 (six) hours as needed for mild pain. For pain   albuterol 108 (90 Base) MCG/ACT inhaler Commonly known as: VENTOLIN HFA Inhale 2 puffs into the lungs every 6 (six) hours as needed for wheezing or shortness of breath. For shortness of breath   Aleve 220 MG Caps Generic drug: Naproxen Sodium Take by mouth.   ALPRAZolam 0.25 MG tablet Commonly known as: XANAX Take 0.125 mg by mouth daily as needed for anxiety. Pt takes 1/2 tablet as needed   budesonide-formoterol 80-4.5 MCG/ACT inhaler Commonly known as: Symbicort Inhale 2 puffs into the lungs 2 (two) times daily.   calcium carbonate 1250 (500 Ca) MG tablet Commonly known as: OS-CAL - dosed in mg of elemental calcium Take 1 tablet by mouth daily.   famotidine 40 MG tablet Commonly known as: PEPCID TAKE 1 TABLET(40 MG) BY MOUTH EVERY EVENING   FLUoxetine 20 MG tablet Commonly known as: PROZAC Take 20 mg by mouth 2 (two) times daily.   fluticasone 50 MCG/ACT nasal spray Commonly known as: [place]  1 spray into both nostrils daily as needed for allergies or rhinitis.   levothyroxine 100 MCG tablet Commonly known as: SYNTHROID Take 50-100 mcg by mouth See admin instructions. TAKES 100 MCG DAILY EXCEPT ON THURSDAYS AND TAKES 50 MCG   Melatonin 3 MG Caps Take 3 mg by mouth at bedtime as needed (SLEEP).   montelukast 10 MG tablet Commonly known as: SINGULAIR TAKE 1 TABLET(10 MG) BY MOUTH AT BEDTIME   omeprazole 40 MG capsule Commonly known as: PRILOSEC TAKE 1 CAPSULE(40 MG) BY MOUTH DAILY   rosuvastatin 10 MG tablet Commonly known as: CRESTOR   Sudafed 30 MG tablet Generic drug: pseudoephedrine  Take 30 mg by mouth every 4 (four) hours as needed for congestion.   Vitamin D (Ergocalciferol) 1.25 MG (50000 UT) Caps capsule Commonly known as: DRISDOL Take 50,000 Units by mouth every 7 (seven) days. On Mondays       Past Medical History:  Diagnosis Date  . Anemia    pernicious anemia - 1991   . Anxiety   . Arthritis    rheumatoid arthritis   . Chronic fatigue   . Cystitis, interstitial   . Depression   . Disc disorder of lumbar region 2013  . Fibromyalgia   . GERD (gastroesophageal reflux disease)   . History of kidney stones   . Hypothyroidism   . Lyme disease    hx of possible   . 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 Osteoporosis   . Pneumonia    hx of   . Rheumatoid arthritis [facility])     Past Surgical History:  Procedure Laterality Date  . APPENDECTOMY  1978  . bladder hydrodistention  1998  . BREAST SURGERY     reduction   . CERVICAL DISC ARTHROPLASTY N/A 04/03/2016   Procedure: CERVICAL SIX CERVICAL SEVEN [place] ARTHROPLASTY;  Surgeon: Jovita Gamma, MD;  Location: [facility];  Service: Neurosurgery;  Laterality: N/A;  left side approach  . COSMETIC SURGERY  2005   face lift  . EXCISION MORTON'S NEUROMA Left 05/08/2017   Procedure: EXCISION MORTON'S NEUROMA Left 3rd webspace;  Surgeon: [name], MD;  Location: [facility];  Service: Orthopedics;  Laterality: Left;  . EYE SURGERY     lower eye lids  . LUMBAR LAMINECTOMY/DECOMPRESSION MICRODISCECTOMY  06/10/2011   Procedure: LUMBAR LAMINECTOMY/DECOMPRESSION MICRODISCECTOMY 1 LEVEL;  Surgeon: Hosie Spangle, MD;  Location: [facility];  Service: Neurosurgery;  Laterality: Left;  Lumbar Five Sacral One Lumbar Laminectomy Decompression Microdiscectomy     Review of systems negative except as noted in HPI / PMHx or noted below:  Review of Systems  Constitutional: Negative.   HENT: Negative.   Eyes: Negative.   Respiratory: Negative.    Cardiovascular: Negative.   Gastrointestinal: Negative.   Genitourinary: Negative.   Musculoskeletal: Negative.   Skin: Negative.   Neurological: Negative.   Endo/Heme/Allergies: Negative.   Psychiatric/Behavioral: Negative.      Objective:   Vitals:   11/03/18 1358  BP: (!) 148/84  Pulse: 74  Resp: 16  Temp: 98.1 F (36.7 C)  SpO2: 95%   Height: 5' (152.4 cm)  Weight: 105 lb 12.8 oz (48 kg)   Physical Exam Constitutional:      Appearance: She is not diaphoretic.  HENT:     Head: Normocephalic.     Right Ear: Tympanic membrane, ear canal and external ear normal.     Left Ear: Tympanic  membrane, ear canal and external ear normal.     Nose: Nose normal. No mucosal edema or rhinorrhea.     Mouth/Throat:     Pharynx: Uvula midline. No oropharyngeal exudate.  Eyes:     Conjunctiva/sclera: Conjunctivae normal.  Neck:     Thyroid: No thyromegaly.     Trachea: Trachea normal. No tracheal tenderness or tracheal deviation.  Cardiovascular:     Rate and Rhythm: Normal rate and regular rhythm.     Heart sounds: Normal heart sounds, S1 normal and S2 normal. No murmur.  Pulmonary:     Effort: No respiratory distress.     Breath sounds: Normal breath sounds. No stridor. No wheezing or rales.  Lymphadenopathy:     Head:     Right side of head: No tonsillar adenopathy.     Left side of head: No tonsillar adenopathy.     Cervical: No cervical adenopathy.  Skin:    Findings: No erythema or rash.     Nails: There is no clubbing.   Neurological:     Mental Status: She is alert.     Diagnostics:    Spirometry was performed and demonstrated an FEV1 of 1.83 at 98 % of predicted.  Assessment and Plan:   1. Asthma, moderate persistent, well-controlled   2. Pulmonary emphysema, unspecified emphysema type ([place])   3. LPRD (laryngopharyngeal reflux disease)   4. Perennial allergic rhinitis   5. Facial pain syndrome     1.  Continue to Treat and prevent inflammation:   A.   Flonase 1-2 sprays each nostril 1 time per day  B.  montelukast 10 mg tablet 1 time per day  C.  Symbicort 80-2 inhalations 1-2 times a day  2.  Continue to Treat and prevent reflux:   A.  Continue to consolidate caffeine and alcohol consumption  B.  Omeprazole 40 mg tablet in a.m.  C.  Famotidine 40 mg mg tablet in p.m.  3.  If needed:   A.  OTC antihistamine  B.  Nasal saline  C.  Proventil HFA or similar 2 inhalations every 4-6 hours  4. Obtain fall flu vaccine (and COVID vaccine)  5. Return to clinic in 3 months or earlier if problem.    Pamela Larson appears to be doing very well on her current therapy and I think there may be an opportunity to have her consolidate some of her treatment by decreasing her Flonase and Symbicort as noted above.  She will continue to aggressively treat her reflux.  I will see her back in this clinic and 3 months or earlier if there is a problem.  Allena Katz, MD Allergy / Immunology [place]

## 2018-11-04 ENCOUNTER — Encounter: Payer: Self-pay | Admitting: Allergy and Immunology

## 2018-12-05 DIAGNOSIS — Z23 Encounter for immunization: Secondary | ICD-10-CM | POA: Diagnosis not present

## 2018-12-07 DIAGNOSIS — R300 Dysuria: Secondary | ICD-10-CM | POA: Diagnosis not present

## 2018-12-25 ENCOUNTER — Telehealth (INDEPENDENT_AMBULATORY_CARE_PROVIDER_SITE_OTHER): Payer: Medicare Other | Admitting: Internal Medicine

## 2018-12-25 ENCOUNTER — Telehealth: Payer: Self-pay | Admitting: Internal Medicine

## 2018-12-25 ENCOUNTER — Telehealth: Payer: Self-pay | Admitting: Acute Care

## 2018-12-25 DIAGNOSIS — J410 Simple chronic bronchitis: Secondary | ICD-10-CM | POA: Diagnosis not present

## 2018-12-25 DIAGNOSIS — K219 Gastro-esophageal reflux disease without esophagitis: Secondary | ICD-10-CM

## 2018-12-25 DIAGNOSIS — J301 Allergic rhinitis due to pollen: Secondary | ICD-10-CM

## 2018-12-25 MED ORDER — AZITHROMYCIN 250 MG PO TABS: 250.0000 mg | ORAL_TABLET | Freq: Every day | ORAL | 0 refills | Status: DC

## 2018-12-25 NOTE — Telephone Encounter (Signed)
Primary Pulmonologist: Mc[facility] Last office visit and with whom: 7.13.2020 w/ Sarah NP What do we see them for (pulmonary problems): COPD; sinusitis Last OV assessment/plan: Assessment/Plan   Sinusitis Resolved after 2 rounds of z pack Plan: We will send you home with a sputum collection cup and directions on how to collect your sputum.  Wait until the beginning of August to collect your sputum.  We will send a culture of your sputum ( Culture, AFB, Fungal)  Your CT Sinus was normal Continue over the counter decongestant Use as directed as needed Continue nasal irrigations with Saline with Aloe Continue Mucinex with a full glass of water Continue Flonase once daily as you have been doing. Continue Singulair 10 mg daily. Continue Sudafed daily as you have been doing. Continue Symbicort daily 2 puffs twice daily Follow up in 11/2018  as is scheduled with Judson Roch NP Follow up with Dr. Warren Danes as you have scheduled Call as needed for follow up.  Flu shot in the fall. Please contact office for sooner follow up if symptoms do not improve or worsen or seek emergency care    COPD Stable interval Plan Continue Symbicort daily 2 puffs twice daily Continue albuterol as rescue as needed.   Allergic Rhinitis Stable with daily maintenance Plan Your CT Sinus was normal Continue over the counter decongestant Use as directed as needed Continue nasal irrigations with Saline with Aloe Continue Mucinex with a full glass of water Continue Flonase once daily as you have been doing. Continue Singulair 10 mg daily. Continue Sudafed daily as you have been doing.     Continue to practice social distancing, wash hand frequently, and wear a face mask if you leave your home. Remember to utilize the early hours at grocery stores/ drug stores  for people > 59 years old or any underlying health risks, as the stores are cleaner and less crowded at these times.   Was appointment offered to patient  (explain)?  MyChart video visit with Warner Mccreedy NP for today 12/25/18 at 1430.  Patient is unsure if she will be able to operate the video visit so may need to be changed to televisit.  Tips/tricks given to patient for video visit.  Reason for call: head congestion with PND, some yellow/clouding nasal drainage, headache, pain above the left eye x2 days.  Denies any fever but does not some chills/sweats last night.  Denies any sick contacts, hemoptysis.

## 2018-12-25 NOTE — Telephone Encounter (Signed)
Noted, thank you. Called spoke with patient and discussed Brian's recommendations to go ahead and switch pulmonologists to Dr. Shearon Stalls.  Patient okay with this and appt switched.  Nothing further needed at this time, will sign off.

## 2018-12-25 NOTE — Telephone Encounter (Signed)
It may be beneficial for this patient to be scheduled with a new pulmonologist as Dr. Lake Bells is no longer seeing patients in clinic as he is only can be working in the hospital.  I would recommend given her symptoms as well as allergic rhinitis and COPD that she be established with Dr. Shearon Stalls.  If the patient is agreeable to this it may be beneficial to have this visit be with Dr. Shearon Stalls to go ahead and establish that relationship.  If the patient is not agreeable and would like to have an acute visit with an NP that is perfectly okay.  We can work to get the patient established with another pulmonary provider after the visit.  Wyn Quaker, FNP

## 2018-12-25 NOTE — Progress Notes (Signed)
Virtual Visit via Video Note  I connected with Pamela Larson on 12/25/18 at  2:30 PM EST by a video enabled telemedicine application and verified that I am speaking with the correct person using two identifiers.  Location: Patient: Pamela Larson Provider: Spero Geralds   I discussed the limitations of evaluation and management by telemedicine and the availability of in person appointments. The patient expressed understanding and agreed to proceed.         Pamela Larson    [id number]    22-Sep-1945  Primary Care Physician:Shaw, Gwyndolyn Saxon, MD Date of Appointment: 12/25/2018 Established Patient Visit  Chief complaint:  Acute sinusitis  HPI: Pamela Larson is a previous patient of Dr. Jordan Hawks who is seeing me today for a sick visit.  This visit was conducted as a virtual visit.  She has a 40-pack-year smoking history, quit with evidence of centrilobular emphysema, and chronic bronchitis.  She additionally has being seen by Dr. Neldon Mc in allergy for perennial allergic rhinitis, as well as gastroesophageal reflux disease.  Interval Updates: She reports a day and a half of feeling fatigued more than usual, worsening drainage and pressure over her eyes and face, headaches. She has not had any documented fevers, although reports feeling warm, and that 98 is actually a fever for her which is what she has been running.. She has had hot flashes and chills. Drainage is prominent on the left side of her face which is typical of her previous episodes. Minimal cough.  No shortness of breath. She has had itchy/watery eyes as well.  She does take symbicort which she takes in the morning and usually misses the evening dose.  She does not use her rescue inhaler. No wheezing. No recent sick contacts.  She takes nasacort two sprays in the morning in each nostril.  She has been on claritin for about a year.   No pets at home, but she occasional takes care of her daughter's dog.  No  smokers at home.  Previous triggers include flowers and outdoor allergen exposures-including in May and June.  Treatment of her "silent GERD" omeprazole 40 mg once/day.  She takes montelukast 10 mg at bedtime, famotidine at 40 mg at bedtime.  She feels her reflux is well controlled.    I have reviewed the patient's family social and past medical history and updated as appropriate.  Past Medical History:  Diagnosis Date  . Anemia    pernicious anemia - 1991   . Anxiety   . Arthritis    rheumatoid arthritis   . Chronic fatigue   . Cystitis, interstitial   . Depression   . Disc disorder of lumbar region 2013  . Fibromyalgia   . GERD (gastroesophageal reflux disease)   . History of kidney stones   . Hypothyroidism   . Lyme disease    hx of possible   . 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 Osteoporosis   . Pneumonia    hx of   . Rheumatoid arthritis [facility])     Past Surgical History:  Procedure Laterality Date  . APPENDECTOMY  1978  . bladder hydrodistention  1998  . BREAST SURGERY     reduction   . CERVICAL DISC ARTHROPLASTY N/A 04/03/2016   Procedure: CERVICAL SIX CERVICAL SEVEN [place] ARTHROPLASTY;  Surgeon: Jovita Gamma, MD;  Location: [place];  Service: Neurosurgery;  Laterality: N/A;  left side approach  .  COSMETIC SURGERY  2005   face lift  . EXCISION MORTON'S NEUROMA Left 05/08/2017   Procedure: EXCISION MORTON'S NEUROMA Left 3rd webspace;  Surgeon: [name], MD;  Location: [place];  Service: Orthopedics;  Laterality: Left;  . EYE SURGERY     lower eye lids  . LUMBAR LAMINECTOMY/DECOMPRESSION MICRODISCECTOMY  06/10/2011   Procedure: LUMBAR LAMINECTOMY/DECOMPRESSION MICRODISCECTOMY 1 LEVEL;  Surgeon: Hosie Spangle, MD;  Location: [place] [facility];  Service: Neurosurgery;  Laterality: Left;  Lumbar Five Sacral One Lumbar Laminectomy Decompression Microdiscectomy     Family History  Problem Relation  Age of Onset  . Breast cancer Mother   . Colon cancer Mother        colorectal  . Pancreatic cancer Mother   . Rectal cancer Mother   . Cancer Maternal Grandmother        abdominal  . Lung cancer Other        maternal great aunt  . Esophageal cancer Neg Hx   . Stomach cancer Neg Hx     Social History   Socioeconomic History  . Marital status: Single    Spouse name: Not on file  . Number of children: 1  . Years of education: Not on file  . Highest education level: Not on file  Occupational History  . Occupation: retired Futures trader  Social Needs  . Financial resource strain: Not on file  . Food insecurity    Worry: Not on file    Inability: Not on file  . Transportation needs    Medical: Not on file    Non-medical: Not on file  Tobacco Use  . Smoking status: Former Smoker    Packs/day: 2.00    Years: 20.00    Pack years: 40.00    Types: Cigarettes    Quit date: 02/11/1990    Years since quitting: 28.8  . Smokeless tobacco: Never Used  Substance and Sexual Activity  . Alcohol use: Yes    Alcohol/week: 14.0 standard drinks    Types: 14 Standard drinks or equivalent per week    Comment: drink per nite scotch  . Drug use: No  . Sexual activity: Never    Partners: Male  Lifestyle  . Physical activity    Days per week: Not on file    Minutes per session: Not on file  . Stress: Not on file  Relationships  . Social Herbalist on phone: Not on file    Gets together: Not on file    Attends religious service: Not on file    Active member of club or organization: Not on file    Attends meetings of clubs or organizations: Not on file    Relationship status: Not on file  . Intimate partner violence    Fear of current or ex partner: Not on file    Emotionally abused: Not on file    Physically abused: Not on file    Forced sexual activity: Not on file  Other Topics Concern  . Not on file  Social History Narrative  . Not on file    Review of  systems: Constitutional:No fevers, chills, night sweats, or weight loss. CV: No chest pain, or palpitations. Resp: No hemoptysis.  Physical Exam: There were no vitals taken for this visit. - virtual   Gen:      No acute distress Eyes: EOMI, sclera anicteric Mouth: MMM Lungs: no audible wheezing  Data Reviewed: Imaging: I have personally reviewed the  CT sinuses performed in May 2020 which demonstrates no acute acute or chronic sinus disease.  PFTs:  PFT Results Latest Ref Rng & Units 10/16/2017  FVC-Pre L 2.60  FVC-Predicted Pre % 103  FVC-Post L 2.55  FVC-Predicted Post % 101  Pre FEV1/FVC % % 80  Post FEV1/FCV % % 82  FEV1-Pre L 2.08  FEV1-Predicted Pre % 110  FEV1-Post L 2.10  DLCO UNC% % 84  DLCO COR %Predicted % 91  TLC L 4.43  TLC % Predicted % 99  RV % Predicted % 85    Labs:  Immunization status: Immunization History  Administered Date(s) Administered  . Influenza, High Dose Seasonal PF 12/07/2016, 11/19/2017  . Pneumococcal Polysaccharide-23 10/22/2017  . Tdap 11/01/2016  . Zoster Recombinat (Shingrix) 12/26/2016, 06/12/2017   up to date and documented.up to date and documented.  Assessment:  GERD Perennial Allergic Rhinitis Chronic bronchitis History of tobacco use, 40-pack-year  Plan/Recommendations:  Pamela Larson presents today with worsening of her perennial allergic rhinitis.  She does not appear to have any evidence of chronic bronchitis related exacerbation.  Reflux symptoms are controlled.  Continues to abstain from tobacco.  She is already on a fairly aggressive rhinitis therapy, with Nasacort, Claritin, montelukast, saline nasal lavage.    Pamela Larson has been seen by allergy, pulmonary and ENT essentially monthly for the last 18 months, frequently requiring courses of azithromycin may be deleterious to her health by contributing to bacterial resistance, prolonged QT, and hearing loss.  I am sincerely doubtful that her current episode of  sinusitis is related to a bacterial infection.  This appears to be the only solution which will satisfy her.  If this is not satisfactory in relieving her symptoms, I would recommend switching her loratadine to a different antihistamine such as cetirizine, or chlorpheniramine.  I also recommend taking her Nasacort nasal spray 1 spray in each nose in the morning and then again at night, rather than taking 2 sprays both in the morning.    Return to Care: I recommend she follow-up with Eric Form, NP.   Lenice Llamas, MD Pulmonary and [place]

## 2018-12-25 NOTE — Telephone Encounter (Signed)
Dx code is J30.1   Called pt's pharmacy and spoke with Rob providing him with the dx code and Rob verbalized understanding. Nothing further needed.

## 2019-01-11 DIAGNOSIS — E038 Other specified hypothyroidism: Secondary | ICD-10-CM | POA: Diagnosis not present

## 2019-01-11 DIAGNOSIS — M810 Age-related osteoporosis without current pathological fracture: Secondary | ICD-10-CM | POA: Diagnosis not present

## 2019-01-11 DIAGNOSIS — I2510 Atherosclerotic heart disease of native coronary artery without angina pectoris: Secondary | ICD-10-CM | POA: Diagnosis not present

## 2019-01-11 DIAGNOSIS — D510 Vitamin B12 deficiency anemia due to intrinsic factor deficiency: Secondary | ICD-10-CM | POA: Diagnosis not present

## 2019-01-11 DIAGNOSIS — D472 Monoclonal gammopathy: Secondary | ICD-10-CM | POA: Diagnosis not present

## 2019-01-14 ENCOUNTER — Other Ambulatory Visit: Payer: Self-pay | Admitting: Acute Care

## 2019-01-15 ENCOUNTER — Telehealth: Payer: Self-pay | Admitting: Acute Care

## 2019-01-15 MED ORDER — ANORO ELLIPTA 62.5-25 MCG/INH IN AEPB: 1.0000 | INHALATION_SPRAY | Freq: Every day | RESPIRATORY_TRACT | 11 refills | Status: DC

## 2019-01-15 NOTE — Telephone Encounter (Signed)
LM informing pt anoro has been sent.   Nothing further needed at this time.

## 2019-01-15 NOTE — Telephone Encounter (Signed)
Anoro sent to pharmacy.

## 2019-01-15 NOTE — Telephone Encounter (Signed)
Call made to patient, confirmed DOB, she is wanting to switch back to Anoro for the remainder of the year before she reaches the doughnut hole. She states the anoro works better for her anyway she only switched to Symbicort due to the price of anoro. She is wanting anoro sent in temporarily while she can afford it.   ND please advise. Thanks.

## 2019-01-16 ENCOUNTER — Other Ambulatory Visit: Payer: Self-pay | Admitting: Allergy and Immunology

## 2019-01-18 DIAGNOSIS — R82998 Other abnormal findings in urine: Secondary | ICD-10-CM | POA: Diagnosis not present

## 2019-01-21 DIAGNOSIS — D510 Vitamin B12 deficiency anemia due to intrinsic factor deficiency: Secondary | ICD-10-CM | POA: Diagnosis not present

## 2019-01-25 ENCOUNTER — Other Ambulatory Visit: Payer: Self-pay

## 2019-01-25 DIAGNOSIS — Z20822 Contact with and (suspected) exposure to covid-19: Secondary | ICD-10-CM

## 2019-01-25 DIAGNOSIS — Z20828 Contact with and (suspected) exposure to other viral communicable diseases: Secondary | ICD-10-CM | POA: Diagnosis not present

## 2019-01-27 LAB — NOVEL CORONAVIRUS, NAA: SARS-CoV-2, NAA: NOT DETECTED

## 2019-02-09 ENCOUNTER — Ambulatory Visit: Payer: Medicare Other | Admitting: Allergy and Immunology

## 2019-02-19 ENCOUNTER — Other Ambulatory Visit: Payer: Self-pay | Admitting: Allergy and Immunology

## 2019-03-11 ENCOUNTER — Ambulatory Visit: Payer: Medicare Other

## 2019-03-11 DIAGNOSIS — D510 Vitamin B12 deficiency anemia due to intrinsic factor deficiency: Secondary | ICD-10-CM | POA: Diagnosis not present

## 2019-03-16 ENCOUNTER — Ambulatory Visit: Payer: Medicare Other

## 2019-03-19 ENCOUNTER — Ambulatory Visit: Payer: Medicare Other | Attending: Internal Medicine

## 2019-03-19 DIAGNOSIS — Z23 Encounter for immunization: Secondary | ICD-10-CM | POA: Insufficient documentation

## 2019-03-19 NOTE — Progress Notes (Signed)
   Covid-19 Vaccination Clinic  Name:  Pamela Larson    MRN: [id number] DOB: 1945-02-13  03/19/2019  [name] was observed post Covid-19 immunization for 15 minutes without incidence. She was provided with Vaccine Information Sheet and instruction to access the V-Safe system.   [name] was instructed to call 911 with any severe reactions post vaccine: Marland Kitchen Difficulty breathing  . Swelling of your face and throat  . A fast heartbeat  . A bad rash all over your body  . Dizziness and weakness    Immunizations Administered    Name Date Dose VIS Date Route   Pfizer COVID-19 Vaccine 03/19/2019  3:34 PM 0.3 mL 01/22/2019 Intramuscular   Manufacturer: [place]   Lot: [id number]   [place]: [id number]

## 2019-04-01 ENCOUNTER — Other Ambulatory Visit: Payer: Self-pay | Admitting: Allergy and Immunology

## 2019-04-02 ENCOUNTER — Other Ambulatory Visit: Payer: Self-pay | Admitting: Allergy and Immunology

## 2019-04-13 ENCOUNTER — Ambulatory Visit: Payer: Medicare Other | Attending: Internal Medicine

## 2019-04-13 DIAGNOSIS — Z23 Encounter for immunization: Secondary | ICD-10-CM | POA: Insufficient documentation

## 2019-04-13 NOTE — Progress Notes (Signed)
   Covid-19 Vaccination Clinic  Name:  Pamela Larson    MRN: [id number] DOB: December 08, 1945  04/13/2019  [name] was observed post Covid-19 immunization for 15 minutes without incident. She was provided with Vaccine Information Sheet and instruction to access the V-Safe system.   [name] was instructed to call 911 with any severe reactions post vaccine: Marland Kitchen Difficulty breathing  . Swelling of face and throat  . A fast heartbeat  . A bad rash all over body  . Dizziness and weakness   Immunizations Administered    Name Date Dose VIS Date Route   Pfizer COVID-19 Vaccine 04/13/2019  3:00 PM 0.3 mL 01/22/2019 Intramuscular   Manufacturer: [place]   Lot: [id number]   [place]: [id number]

## 2019-04-20 ENCOUNTER — Telehealth: Payer: Self-pay | Admitting: Acute Care

## 2019-04-20 NOTE — Telephone Encounter (Signed)
I called and spoke with the patient and she states that since last night she has had a sore throat, cough with green sputum and a slight fever. She states that she usually runs 97.0 and her temp is up to 98.2, which is high for her. She wanted something called in and I explained to her that one of our providers would need to speak with her via phone or video visit. I offered her a visit and she requested Judson Roch since she has been seeing her in the past. I have scheduled her for tomorrow at 3 pm for a telephone visit. I advised her that if her symptoms worsen overnight that she proceed to the ER and she states that she should be ok.

## 2019-04-20 NOTE — Telephone Encounter (Signed)
That's perfect. Thanks so much

## 2019-04-21 ENCOUNTER — Other Ambulatory Visit: Payer: Self-pay

## 2019-04-21 ENCOUNTER — Ambulatory Visit (INDEPENDENT_AMBULATORY_CARE_PROVIDER_SITE_OTHER): Payer: Medicare Other | Admitting: Acute Care

## 2019-04-21 DIAGNOSIS — J0191 Acute recurrent sinusitis, unspecified: Secondary | ICD-10-CM

## 2019-04-21 DIAGNOSIS — Z87891 Personal history of nicotine dependence: Secondary | ICD-10-CM | POA: Diagnosis not present

## 2019-04-21 MED ORDER — CLINDAMYCIN HCL 150 MG PO CAPS: 150.0000 mg | ORAL_CAPSULE | Freq: Three times a day (TID) | ORAL | 0 refills | Status: AC

## 2019-04-21 NOTE — Progress Notes (Signed)
Virtual Visit via Video Note  I connected with Pamela Larson on 04/21/19 at  3:00 PM EST by a video enabled telemedicine application and verified that I am speaking with the correct person using two identifiers.  Location: Patient: At home Provider: [place], [place], [place], [address]   I discussed the limitations of evaluation and management by telemedicine and the availability of in person appointments. The patient expressed understanding and agreed to proceed.  Pamela Larson is a 74 y.o. female former smoker ( quit 1992 with a 40 pack year smoking history) with recurrent infections, and bronchitis. She was followed by Dr. Lake Bells, and has been reassigned to Dr. Shearon Stalls..  Synopsis: Referred inJuly 2072for recurrent bronchitis/ sinusitisepisodes ever since August 2015.;smoked 2 packs of cigarettes daily for 20 years, quit in 1992witha 40 pack year smoking history.  Feb 2020>> sick with a bad cold. Treated with Z pack 04/2018>> Well check conversation 05/2018>> Sinus infection>> Z pack 06/2018>> Sinus infection>> Z pack  07/2018>> Better taking pseudophed am and pm.Referred to Dr. Erik Obey Using breath right 08/2018>> Seen by ENT but has now retired 12/2018 Seen by Dr. Desai>> treated with z pack  History of Present Illness: Acute Televisit  for cough, sore throat and green secretions. Pt states she has been experiencing these symptoms for  3 days.  04/20/2019>> Coughing up bright yellow green secretions, lethargic, head hurting, Temp of 98.2, which she states is a fever for her. Facial pain and teeth ache. Appointment with Dr. Annamaria Helling 05/18/2019. She is using Anoro daily, which has helped.  She denies any chest pain, orthopnea or hemoptysis  Observations/Objective: CT Sinus 07/03/2018 FINDINGS: The maxillary, sphenoid, ethmoid, and frontal sinuses are clear. No nasal cavity masses. Nasal septum is sigmoid shaped, essentially midline. Negative intracranial  compartment.  IMPRESSION: Negative exam.  Chest imaging: Chest x-ray from August 2015 was independently reviewed showing possible emphysematous changes (increased AP diameter) with airway thickening in the right lower lobe notable 08/27/2017 HRCT images independently reviewed:Grossly normal pulmonary parenchyma, some mild centrilobular emphysema and airway thickening noted. Aortic atherosclerosis noted.  PFT: September 2019 ratio 80%, FEV1 2.10 L 110% predicted, total lung capacity 4.43 L 99% predicted, DLCO 15.92 mL 84% predicted  Labs: 2019 eosinophil count 0, IgE August 27th 2019 7 Spirometrywas performed and demonstrated an FEV1 of 1.71at 86% of predicted.  Results of blood testsobtained 06 January 2018 after receiving Pneumovax vaccination identified increase in antibody titers directed against multiple serotypes of pneumococcus. Her elevation in titers were somewhere around 2 fold at greatest for the majority of these elevations.  Sputum Cx, Fungus and Mycobacteria>> 08/2017>> All negative/ normal floro  Assessment and Plan: Sinusitis Plan We will prescribe clindamycin 150 mg three times daily for sinus infection. Take for 7 days Probiotic daily while on antibiotics  Try Zyrtec or Allegra daily  for seasonal allergies  Nasacort nasal spray 1 spray in each nose in the morning and then again at night,  Continue nasal irrigations with Saline with Aloe Continue Mucinex with a full glass of water Continue Flonase once daily as you have been doing. Continue Singulair 10 mg daily. Continue Sudafed daily as you have been doing. Continue Anoro 1 puff once daily Continue using breath right strips  Follow Up Instructions: Follow OV up in 2- 3 weeks with Eric Form, NP Follow up with Dr. Carmelina Peal 05/2019 as is scheduled Follow up with ENT.   I discussed the assessment and treatment plan with the patient. The patient  was provided an opportunity to ask questions and all  were answered. The patient agreed with the plan and demonstrated an understanding of the instructions.   The patient was advised to call back or seek an in-person evaluation if the symptoms worsen or if the condition fails to improve as anticipated.  I provided 35  minutes of non-face-to-face time during this encounter.   [name], NP 04/21/2019 3:38 PM

## 2019-04-21 NOTE — Patient Instructions (Addendum)
It is good to talk with you today. Congratulations on getting both vaccines for Covid.  This is great news. We will prescribe clindamycin 150 mg three times daily for sinus infection. Take for 7 days Probiotic daily while on antibiotics  Try Zyrtec or Allegra daily  for seasonal allergies  Nasacort nasal spray 1 spray in each nose in the morning and then again at night,  Continue nasal irrigations with Saline with Aloe Continue Mucinex with a full glass of water Continue Flonase once daily as you have been doing. Continue Singulair 10 mg daily. Continue Sudafed daily as you have been doing. Continue Anoro 1 puff once daily Rinse mouth after use Continue using breath right strips Follow OV up in 2- 3 weeks with Eric Form, NP Follow up with Dr. Carmelina Peal 05/2019 as is scheduled Follow up with ENT. Please contact office for sooner follow up if symptoms do not improve or worsen or seek emergency care

## 2019-04-23 ENCOUNTER — Encounter: Payer: Self-pay | Admitting: Acute Care

## 2019-05-01 ENCOUNTER — Other Ambulatory Visit: Payer: Self-pay | Admitting: Allergy and Immunology

## 2019-05-03 ENCOUNTER — Other Ambulatory Visit: Payer: Self-pay | Admitting: Allergy and Immunology

## 2019-05-10 ENCOUNTER — Other Ambulatory Visit: Payer: Self-pay

## 2019-05-10 ENCOUNTER — Telehealth: Payer: Self-pay | Admitting: Acute Care

## 2019-05-10 ENCOUNTER — Encounter: Payer: Self-pay | Admitting: Acute Care

## 2019-05-10 ENCOUNTER — Ambulatory Visit (INDEPENDENT_AMBULATORY_CARE_PROVIDER_SITE_OTHER): Payer: Medicare Other | Admitting: Acute Care

## 2019-05-10 VITALS — BP 140/94 | HR 84 | Temp 97.2°F | Ht 60.0 in | Wt 104.4 lb

## 2019-05-10 DIAGNOSIS — J0110 Acute frontal sinusitis, unspecified: Secondary | ICD-10-CM

## 2019-05-10 DIAGNOSIS — J449 Chronic obstructive pulmonary disease, unspecified: Secondary | ICD-10-CM | POA: Diagnosis not present

## 2019-05-10 DIAGNOSIS — D510 Vitamin B12 deficiency anemia due to intrinsic factor deficiency: Secondary | ICD-10-CM

## 2019-05-10 DIAGNOSIS — K219 Gastro-esophageal reflux disease without esophagitis: Secondary | ICD-10-CM

## 2019-05-10 DIAGNOSIS — J301 Allergic rhinitis due to pollen: Secondary | ICD-10-CM | POA: Diagnosis not present

## 2019-05-10 DIAGNOSIS — Z8619 Personal history of other infectious and parasitic diseases: Secondary | ICD-10-CM | POA: Diagnosis not present

## 2019-05-10 NOTE — Telephone Encounter (Signed)
I called patient to make her aware that I will reach out to provider for guidance and get back with her.  Sarah, Please advise on what the next step would be for ID referral.

## 2019-05-10 NOTE — Patient Instructions (Addendum)
It is good to see you today. Continue Anoro 1 puff once daily Rinse mouth after use Continue Proventil as needed for breakthrough shortness of breath or wheezing  We will refer you to infectious disease for evaluation.( Dr. Graylon Good)  Please take your documentation with you do they can see the history of your illness.  We will refer you to ENT also for further evaluation of your sinus issues.. Follow up with Pamela Vensel NP  in 4 months or before as needed.  Please contact office for sooner follow up if symptoms do not improve or worsen or seek emergency care  Addendum: 3/29 8 pm Pt will see Dr. Graylon Good the week of 4/5 As she will not be seen this week by ID , we will check CBC with diff, CMET, Sed rate, .Serum Folate, reticulocyte Count, serum iron, iron binding capacity. I want to ensure she is not anemic, or has leukocytes.

## 2019-05-10 NOTE — Progress Notes (Signed)
History of Present Illness Pamela Larson is a 74 y.o. female former Larson  ( quit 1992 with a 40 pack year smoking history) with emphysema,  recurrent infections, and bronchitis, RA and pernicious anemia. She was followed by Dr. Lake Larson, and has been reassigned to Dr. Shearon Larson..  Synopsis:Referred inJuly 2066for recurrent bronchitis/ sinusitisepisodes ever since August 2015.;smoked 2 packs of cigarettes daily for 20 years, quit in 1992witha 40 pack year smoking history.She has a history significant for Lyme Disease 1991  Recent visits Feb 2020>> sick with a bad cold. Treated with Z pack 04/2018>> Well check conversation 05/2018>> Sinus infection>> Z pack 06/2018>> Sinus infection>> Z pack  07/2018>> Better taking pseudophed am and pm.Referred to Dr. Erik Larson Using breath right 08/2018>> Seen by ENT but has now retired 12/2018 Seen by Dr. Desai>> treated with z pack 04/21/2019>> Frontal Sinus infection>> Clindamycin    05/10/2019 Follow up after Acute Sinusitis Pt. Presents for follow up. She was seen virtually 04/21/2019 with complaints of sinus infection. She had tenderness beneath her eyes and her teeth hurt. Secretions were green. She has been treated with a z-pack frequently, so I treated her with Clindamycin. She states she has had resolution of sinusitis symptoms until Friday,3/26,  when she felt symptoms were returning.  She has yellow secretions. The green secretions have resolved. She has been seen previously by Dr. Erik Larson, and had been referred back to ENT. CT Sinus done 06/2018  was negative, as were  sputum cultures.She has also been seen by Dr. Owens Larson at [facility] in [facility]. She has been compliant with her Anoro. She rarely uses her rescue inhaler. She is compliant with her Singulair and Flonase.  However, today she states she feels there is something else going on with her . She feels this is not her recurrent sinus infections. She feels fatigued and generally  unwell. She wonders if this could be a result of her recent Covid Vaccination. 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 I have reassured  her getting vaccinated was very important, and safe. She states he has had bouts like this ever since she was diagnosed with Lyme Disease in 1991, when she developed a rash across her back, fatigue and headaches.. She can pinpoint this as the beginning of her chronic illnesses. She states she feels similar to when she was diagnosed with Lyme disease in 1991. ( She was seen by Pamela Smoker MD in [place], [place]. at the time) . She was also diagnosed with chronic fatigue syndrome. ( Dr. Jeani Larson in [place]) .  She is very emotional in the office today. Tired of not feeling well. She was diagnosed with pernicious anemia ( ? 2/2 Macrodantin used to treat  interstitial cyctitis) She is continuing to take B 12 shots per Pamela Larson, her  PCP. Additionally she has been followed by Dr. Marcie Larson for RA. She has everything documented in detail. Years of diagnosis, symptoms and treatment.  She did say that while she was told she had Lyme disease, she never had a + ELISA or Western Blot. She states she was treated with 28 days of Doxycycline in 2017. She is concerned she is having a re-lapse as she has had recurrence of similar symptoms. We did discuss that research has shown that Lyme disease , once treated does not recur, but that symptoms can be 2/2 other  bacteria. She has not had a recurrent rash that she is aware of.   She has been treated  frequently with antibiotics, I am going to refer to ID for further evaluation , as her health history is complex, and cultures have been negative. She was treated with Clindamycin 3/10, and she is asking for additional antibiotics. As she is afebrile, I will check CBC with diff, CMET and Sed rate, in addition to labs to evaluate  pernicious anemia. I will also refer to ENT. She has follow up with Dr. Neldon Larson 4/6.   Vaccines : flu vaccine Covid Vaccine Pfizer 04/13/2019 05/04/2019( She is less sure of this date)  Test Results: Labs 05/10/2019>> Pending 07/03/2018 FINDINGS: The maxillary, sphenoid, ethmoid, and frontal sinuses are clear. No nasal cavity masses. Nasal septum is sigmoid shaped, essentially midline. Negative intracranial compartment.  Chest imaging: Chest x-ray from August 2015 was independently reviewed showing possible emphysematous changes (increased AP diameter) with airway thickening in the right lower lobe notable 08/27/2017 HRCT images independently reviewed:Grossly normal pulmonary parenchyma, some mild centrilobular emphysema and airway thickening noted. Aortic atherosclerosis noted.  PFT: September 2019 ratio 80%, FEV1 2.10 L 110% predicted, total lung capacity 4.43 L 99% predicted, DLCO 15.92 mL 84% predicted  Labs: 10/2017 eosinophil count 0, IgE August 27th 2019 7 Spirometrywas performed and demonstrated an FEV1 of 1.71at 86% of predicted.  Results of blood testsobtained 06 January 2018 after receiving Pneumovax vaccination identified increase in antibody titers directed against multiple serotypes of pneumococcus. Her elevation in titers were somewhere around 2 fold at greatest for the majority of these elevations.    CBC Latest Ref Rng & Units 10/07/2017 03/27/2016 11/06/2014  WBC 3.4 - 10.8 x10E3/uL 7.9 6.1 8.5  Hemoglobin 11.1 - 15.9 g/dL 14.8 14.0 13.7  Hematocrit 34.0 - 46.6 % 43.1 42.2 41.1  Platelets 150 - 450 x10E3/uL 326 328 305    BMP Latest Ref Rng & Units 03/27/2016 11/06/2014 11/02/2013  Glucose 65 - 99 mg/dL 86 87 115(H)  BUN 6 - 20 mg/dL 10 10 10   Creatinine 0.44 - 1.00 mg/dL 0.65 0.77 0.69  Sodium 135 - 145 mmol/L 139 139 139  Potassium 3.5 - 5.1 mmol/L 4.3 4.1 3.4(L)  Chloride 101 - 111 mmol/L 101 99(L) 99  CO2 22 - 32 mmol/L 25 27 22   Calcium 8.9 -  10.3 mg/dL 9.8 9.7 9.1    BNP No results found for: BNP  ProBNP No results found for: PROBNP  PFT    Component Value Date/Time   FEV1PRE 2.08 10/16/2017 1355   FEV1POST 2.10 10/16/2017 1355   FVCPRE 2.60 10/16/2017 1355   FVCPOST 2.55 10/16/2017 1355   TLC 4.43 10/16/2017 1355   DLCOUNC 15.92 10/16/2017 1355   PREFEV1FVCRT 80 10/16/2017 1355   PSTFEV1FVCRT 82 10/16/2017 1355    No results found.   Past medical hx Past Medical History:  Diagnosis Date  . Anemia    pernicious anemia - 1991   . Anxiety   . Arthritis    rheumatoid arthritis   . Chronic fatigue   . Cystitis, interstitial   . Depression   . Disc disorder of lumbar region 2013  . Fibromyalgia   . GERD (gastroesophageal reflux disease)   . History of kidney stones   . Hypothyroidism   . Lyme disease    hx of possible   . Neuromuscular disorder ([place])    hx of neuropathy in 1991 due to pernicious anemia   . Osteoporosis   .  Pneumonia    hx of   . Rheumatoid arthritis ([place])      Social History   Tobacco Use  . Smoking status: Former Larson    Packs/day: 2.00    Years: 20.00    Pack years: 40.00    Types: Cigarettes    Quit date: 02/11/1990    Years since quitting: 29.2  . Smokeless tobacco: Never Used  Substance Use Topics  . Alcohol use: Yes    Alcohol/week: 14.0 standard drinks    Types: 14 Standard drinks or equivalent per week    Comment: drink per nite scotch  . Drug use: No    Ms.Valladares reports that she quit smoking about 29 years ago. Her smoking use included cigarettes. She has a 40.00 pack-year smoking history. She has never used smokeless tobacco. She reports current alcohol use of about 14.0 standard drinks of alcohol per week. She reports that she does not use drugs.  Tobacco Cessation: Former Larson , Quit 1992 with a 40 pack year smoking history  Past surgical hx, Family hx, Social hx all reviewed.  Current Outpatient Medications on File Prior to Visit  Medication Sig    . acetaminophen (TYLENOL) 500 MG tablet Take 1,000 mg by mouth every 6 (six) hours as needed for mild pain. For pain  . albuterol (PROVENTIL HFA;VENTOLIN HFA) 108 (90 Base) MCG/ACT inhaler Inhale 2 puffs into the lungs every 6 (six) hours as needed for wheezing or shortness of breath. For shortness of breath  . ALPRAZolam (XANAX) 0.25 MG tablet Take 0.125 mg by mouth daily as needed for anxiety. Pt takes 1/2 tablet as needed   . ANORO ELLIPTA 62.5-25 MCG/INH AEPB INHALE 1 PUFF INTO THE LUNGS DAILY  . calcium carbonate (OS-CAL - DOSED IN MG OF ELEMENTAL CALCIUM) 1250 MG tablet Take 1 tablet by mouth daily.  . famotidine (PEPCID) 40 MG tablet TAKE 1 TABLET(40 MG) BY MOUTH EVERY EVENING  . FLUoxetine (PROZAC) 20 MG tablet Take 20 mg by mouth 2 (two) times daily.  . fluticasone (FLONASE) 50 MCG/ACT nasal spray Place 1 spray into both nostrils daily as needed for allergies or rhinitis.  Marland Kitchen levothyroxine (SYNTHROID, LEVOTHROID) 100 MCG tablet Take 50-100 mcg by mouth See admin instructions. TAKES 100 MCG DAILY EXCEPT ON THURSDAYS AND TAKES 50 MCG  . Melatonin 3 MG CAPS Take 3 mg by mouth at bedtime as needed (SLEEP).  . montelukast (SINGULAIR) 10 MG tablet TAKE 1 TABLET(10 MG) BY MOUTH AT BEDTIME  . Naproxen Sodium (ALEVE) 220 MG CAPS Take by mouth.  Marland Kitchen omeprazole (PRILOSEC) 40 MG capsule TAKE 1 CAPSULE(40 MG) BY MOUTH DAILY  . pseudoephedrine (SUDAFED) 30 MG tablet Take 30 mg by mouth every 4 (four) hours as needed for congestion.  . rosuvastatin (CRESTOR) 10 MG tablet   . umeclidinium-vilanterol (ANORO ELLIPTA) 62.5-25 MCG/INH AEPB Inhale 1 puff into the lungs daily.  . Vitamin D, Ergocalciferol, (DRISDOL) 50000 UNITS CAPS Take 50,000 Units by mouth every 7 (seven) days. On Mondays   No current facility-administered medications on file prior to visit.     Allergies  Allergen Reactions  . Amoxicillin Nausea And Vomiting     Has patient had a PCN reaction causing immediate rash,  facial/tongue/throat swelling, SOB or lightheadedness with hypotension: #  #  #  YES  #  #  #  Has patient had a PCN reaction causing severe rash involving mucus membranes or skin necrosis: No Has patient had a PCN reaction that required hospitalization No  Has patient had a PCN reaction occurring within the last 10 years: #  #  #  YES  #  #  #  If all of the above answers are "NO", then may proceed with Cephalosporin use.  . Augmentin [Amoxicillin-Pot Clavulanate] Nausea And Vomiting  . Demerol [Meperidine Hcl] Nausea And Vomiting  . Sulfa Antibiotics Nausea And Vomiting    Review Of Systems:  Constitutional:   No  weight loss, night sweats,  Fevers, chills,+ fatigue, or  lassitude.  HEENT:   Occasional  headaches,  Difficulty swallowing,  Tooth/dental problems, or  Sore throat,                No sneezing, itching, ear ache,+  nasal congestion,+  post nasal drip,   CV:  No chest pain,  Orthopnea, PND, swelling in lower extremities, anasarca, dizziness, palpitations, syncope.   GI  No heartburn, indigestion, abdominal pain, nausea, vomiting, diarrhea, change in bowel habits, loss of appetite, bloody stools.   Resp: No shortness of breath with exertion or at rest.  + excess mucus, no productive cough,  No non-productive cough,  No coughing up of blood.  + change in color of mucus.  No wheezing.  No chest wall deformity  Skin: no rash or lesions.  GU: no dysuria, change in color of urine, no urgency or frequency.  No flank pain, no hematuria   MS:  + joint pain or swelling.  No decreased range of motion.  No back pain.  Psych:  No change in mood or affect. + depression and  anxiety.  No memory loss.   Vital Signs BP (!) 140/94 (BP Location: Left Arm, Cuff Size: Normal)   Larson 84   Temp (!) 97.2 F (36.2 C) (Temporal)   Ht 5' (1.524 m)   Wt 104 lb 6.4 oz (47.4 kg)   SpO2 98%   BMI 20.39 kg/m    Physical Exam:  General- No distress,  A&Ox3, pleasant, anxious, tearful ENT:  No sinus tenderness, TM clear, pale nasal mucosa, no oral exudate,+ post nasal drip, no LAN Cardiac: S1, S2, regular rate and rhythm, no murmur Chest: No wheeze/ rales/ dullness; no accessory muscle use, no nasal flaring, no sternal retractions Abd.: Soft Non-tender, ND, BS +, Body mass index is 20.39 kg/m. Ext: No clubbing cyanosis, edema Neuro:  normal strength, MAE x 4, A&O x 3 Skin: No rashes, No lesions, warm and dry Psych: normal mood and behavior   Assessment/Plan  Sinusitis Exacerbation initially resolved after treatment with Clindamycin States she is worried it will flare up again Chronic sinusitis Plan CBC with diff No further antibiotics for now>> will watch clinically and check labs We will refer you to ID for evaluation    Emphysema Plan Continue Anoro  Continue Proventil for breakthrough shortness of breath or wheezzing  Allergic Rhinitis Continue Flonase Continue Singulair  Follow up with Dr. Neldon Larson 05/18/2019 as is scheduled  Symptoms of fatigue, HA,  History of Lyme Disease 1991 Treated with Doxy x 28 days 2017 Plan ID referral for evaluation   GERD Stable interval Plan Continue Pepcid and Prilosec  Health Maintenance Hx. Of ? Immune deficiency 2019 Covid Vaccine [employer]) 04/13/2019, 05/04/2019   This appointment was 60 min long with over 50% of the time in direct face-to-face patient care, assessment, plan of care, and follow-up.   [name], NP 05/10/2019  11:42 AM

## 2019-05-11 ENCOUNTER — Other Ambulatory Visit (INDEPENDENT_AMBULATORY_CARE_PROVIDER_SITE_OTHER): Payer: Medicare Other

## 2019-05-11 DIAGNOSIS — Z8619 Personal history of other infectious and parasitic diseases: Secondary | ICD-10-CM

## 2019-05-11 DIAGNOSIS — D510 Vitamin B12 deficiency anemia due to intrinsic factor deficiency: Secondary | ICD-10-CM | POA: Diagnosis not present

## 2019-05-11 DIAGNOSIS — J0110 Acute frontal sinusitis, unspecified: Secondary | ICD-10-CM | POA: Diagnosis not present

## 2019-05-12 LAB — CBC WITH DIFFERENTIAL/PLATELET
Basophils Absolute: 0 10*3/uL (ref 0.0–0.1)
Basophils Relative: 0.5 % (ref 0.0–3.0)
Eosinophils Absolute: 0.1 10*3/uL (ref 0.0–0.7)
Eosinophils Relative: 1.3 % (ref 0.0–5.0)
HCT: 36.5 % (ref 36.0–46.0)
Hemoglobin: 12.4 g/dL (ref 12.0–15.0)
Lymphocytes Relative: 25.7 % (ref 12.0–46.0)
Lymphs Abs: 1 10*3/uL (ref 0.7–4.0)
MCHC: 34 g/dL (ref 30.0–36.0)
MCV: 102.9 fl — ABNORMAL HIGH (ref 78.0–100.0)
Monocytes Absolute: 0.5 10*3/uL (ref 0.1–1.0)
Monocytes Relative: 13.3 % — ABNORMAL HIGH (ref 3.0–12.0)
Neutro Abs: 2.4 10*3/uL (ref 1.4–7.7)
Neutrophils Relative %: 59.2 % (ref 43.0–77.0)
Platelets: 278 10*3/uL (ref 150.0–400.0)
RBC: 3.54 Mil/uL — ABNORMAL LOW (ref 3.87–5.11)
RDW: 13.4 % (ref 11.5–15.5)
WBC: 4.1 10*3/uL (ref 4.0–10.5)

## 2019-05-12 LAB — IRON, TOTAL/TOTAL IRON BINDING CAP
%SAT: 27 % (calc) (ref 16–45)
Iron: 87 ug/dL (ref 45–160)
TIBC: 317 mcg/dL (calc) (ref 250–450)

## 2019-05-12 LAB — COMPREHENSIVE METABOLIC PANEL
ALT: 12 U/L (ref 0–35)
AST: 16 U/L (ref 0–37)
Albumin: 4.2 g/dL (ref 3.5–5.2)
Alkaline Phosphatase: 53 U/L (ref 39–117)
BUN: 15 mg/dL (ref 6–23)
CO2: 27 mEq/L (ref 19–32)
Calcium: 9.3 mg/dL (ref 8.4–10.5)
Chloride: 98 mEq/L (ref 96–112)
Creatinine, Ser: 0.82 mg/dL (ref 0.40–1.20)
GFR: 68.55 mL/min (ref >60.00)
Glucose, Bld: 91 mg/dL (ref 70–99)
Potassium: 4.4 mEq/L (ref 3.5–5.1)
Sodium: 134 mEq/L — ABNORMAL LOW (ref 135–145)
Total Bilirubin: 0.6 mg/dL (ref 0.2–1.2)
Total Protein: 6.3 g/dL (ref 6.0–8.3)

## 2019-05-12 LAB — RETICULOCYTES
ABS Retic: 35900 cells/uL (ref 20000–8000)
Retic Ct Pct: 1 %

## 2019-05-12 LAB — SEDIMENTATION RATE: Sed Rate: 6 mm/hr (ref 0–30)

## 2019-05-12 LAB — IRON: Iron: 84 ug/dL (ref 42–145)

## 2019-05-12 NOTE — Telephone Encounter (Signed)
I called the patient from home at 8:30 pm on Monday night while I was charting. . She has an appointment with Karolee Ohs on Monday 4/7, that is visible under appointments. Thanks

## 2019-05-13 LAB — FOLATE, RBC AND SERUM
Folate, Hemolysate: 238 ng/mL
Folate, RBC: 657 ng/mL (ref >498)
Folate: 4.1 ng/mL (ref >3.0)
Hematocrit: 36.2 % (ref 34.0–46.6)

## 2019-05-13 NOTE — Progress Notes (Signed)
I have called the patient and reviewed her labs with her in detail. She had no further questions. She has follow up appointment with Dr. Graylon Good 4/7.

## 2019-05-17 ENCOUNTER — Other Ambulatory Visit: Payer: Self-pay | Admitting: Allergy and Immunology

## 2019-05-18 ENCOUNTER — Other Ambulatory Visit: Payer: Self-pay

## 2019-05-18 ENCOUNTER — Encounter: Payer: Self-pay | Admitting: Allergy and Immunology

## 2019-05-18 ENCOUNTER — Ambulatory Visit (INDEPENDENT_AMBULATORY_CARE_PROVIDER_SITE_OTHER): Payer: Medicare Other | Admitting: Allergy and Immunology

## 2019-05-18 VITALS — BP 142/80 | HR 84 | Temp 98.0°F | Resp 16 | Ht 59.0 in | Wt 105.0 lb

## 2019-05-18 DIAGNOSIS — G500 Trigeminal neuralgia: Secondary | ICD-10-CM | POA: Diagnosis not present

## 2019-05-18 DIAGNOSIS — K219 Gastro-esophageal reflux disease without esophagitis: Secondary | ICD-10-CM | POA: Diagnosis not present

## 2019-05-18 DIAGNOSIS — J4540 Moderate persistent asthma, uncomplicated: Secondary | ICD-10-CM

## 2019-05-18 DIAGNOSIS — J439 Emphysema, unspecified: Secondary | ICD-10-CM

## 2019-05-18 DIAGNOSIS — J3089 Other allergic rhinitis: Secondary | ICD-10-CM

## 2019-05-18 MED ORDER — FAMOTIDINE 40 MG PO TABS: ORAL_TABLET | ORAL | 5 refills | Status: DC

## 2019-05-18 MED ORDER — OMEPRAZOLE 40 MG PO CPDR: 40.0000 mg | DELAYED_RELEASE_CAPSULE | Freq: Every morning | ORAL | 5 refills | Status: DC

## 2019-05-18 MED ORDER — MONTELUKAST SODIUM 10 MG PO TABS: 10.0000 mg | ORAL_TABLET | Freq: Every day | ORAL | 5 refills | Status: DC

## 2019-05-18 NOTE — Patient Instructions (Addendum)
  1.  Continue to Treat and prevent inflammation:   A.  Flonase 1-2 sprays each nostril 1 time per day  B.  montelukast 10 mg tablet 1 time per day  C.  Anoro - 1 inhalation 1 time per day  2.  Continue to Treat and prevent reflux:   A.  Continue to consolidate caffeine and alcohol consumption  B.  Omeprazole 40 mg tablet in a.m.  C.  Famotidine 40 mg mg tablet in p.m.  3.  If needed:   A.  OTC antihistamine  B.  Nasal saline  C.  Albuterol HFA - 2 inhalations every 4-6 hours  4. Return to clinic in 6 months or earlier if problem.

## 2019-05-18 NOTE — Progress Notes (Signed)
[place]   Follow-up Note  Referring Provider: Marton Redwood, MD Primary Provider: Marton Redwood, MD Date of Office Visit: 05/18/2019  Subjective:   Pamela Larson (DOB: 1945/06/18) is a 74 y.o. female who returns to the [facility] on 05/18/2019 in re-evaluation of the following:  HPI: Pamela Larson returns to this clinic in evaluation of LPR, emphysema from prolonged tobacco use, possible component of asthma, chronic rhinitis, and a history of left-sided facial pain syndrome.  I last saw her in this clinic on 03 November 2018.  Overall she believes that her airway is doing well.  She has had very little problems with her lower airway and can exert herself without any problem and rarely uses a short acting bronchodilators and had very little issues with her nose while consistently using a combination of a combination LAMA/LABA, a nasal steroid, and a leukotriene modifier.  Apparently she was shifted from using Symbicort and given a combination LABA/ LAMA at some point since I have last seen her in this clinic.  She has a chronic left facial pain syndrome which occasionally flares up for which she is occasionally given antibiotics.  It should be noted that a CT scan of her sinuses performed during one of her active episodes of left-sided face syndrome did not identify any sinusitis.  Her reflux is under excellent control and she has not been having any problems with her throat.  She continues on omeprazole and famotidine and is relatively careful about caffeine and alcohol consumption.  She is convinced that she has had recrudescence of chronic Lyme disease which in the past has given her chronic fatigue syndrome and possibly rheumatoid arthritis.  She had a rather significant flulike episode earlier this spring which she felt was secondary to her Lyme disease and she was treated with an antibiotic and a systemic steroid apparently and  she is now scheduled to see infectious disease regarding this issue.  She spent about 20 minutes today going through a very long and detailed description of her Lyme disease [place] and I have asked her to make sure that she takes this information to her infectious disease appointment.  She did obtain 2 Covid Pfizer vaccinations.  Allergies as of 05/18/2019      Reactions   Amoxicillin Nausea And Vomiting   Has patient had a PCN reaction causing immediate rash, facial/tongue/throat swelling, SOB or lightheadedness with hypotension: #  #  #  YES  #  #  #  Has patient had a PCN reaction causing severe rash involving mucus membranes or skin necrosis: No Has patient had a PCN reaction that required hospitalization No Has patient had a PCN reaction occurring within the last 10 years: #  #  #  YES  #  #  #  If all of the above answers are "NO", then may proceed with Cephalosporin use.   Augmentin [amoxicillin-pot Clavulanate] Nausea And Vomiting   Demerol [meperidine Hcl] Nausea And Vomiting   Sulfa Antibiotics Nausea And Vomiting      Medication List      acetaminophen 500 MG tablet Commonly known as: TYLENOL Take 1,000 mg by mouth every 6 (six) hours as needed for mild pain. For pain   albuterol 108 (90 Base) MCG/ACT inhaler Commonly known as: VENTOLIN HFA Inhale 2 puffs into the lungs every 6 (six) hours as needed for wheezing or shortness of breath. For shortness of breath   Aleve 220  MG Caps Generic drug: Naproxen Sodium Take by mouth.   ALPRAZolam 0.25 MG tablet Commonly known as: XANAX Take 0.125 mg by mouth daily as needed for anxiety. Pt takes 1/2 tablet as needed   Anoro Ellipta 62.5-25 MCG/INH Aepb Generic drug: umeclidinium-vilanterol INHALE 1 PUFF INTO THE LUNGS DAILY   calcium carbonate 1250 (500 Ca) MG tablet Commonly known as: OS-CAL - dosed in mg of elemental calcium Take 1 tablet by mouth daily.   famotidine 40 MG tablet Commonly known as: PEPCID TAKE 1  TABLET(40 MG) BY MOUTH EVERY EVENING   FLUoxetine 20 MG tablet Commonly known as: PROZAC Take 20 mg by mouth 2 (two) times daily.   fluticasone 50 MCG/ACT nasal spray Commonly known as: FLONASE Place 1 spray into both nostrils daily as needed for allergies or rhinitis.   levothyroxine 100 MCG tablet Commonly known as: SYNTHROID Take 50-100 mcg by mouth See admin instructions. TAKES 100 MCG DAILY EXCEPT ON THURSDAYS AND TAKES 50 MCG   Melatonin 3 MG Caps Take 3 mg by mouth at bedtime as needed (SLEEP).   montelukast 10 MG tablet Commonly known as: SINGULAIR Take 1 tablet (10 mg total) by mouth at bedtime.   omeprazole 40 MG capsule Commonly known as: PRILOSEC Take 1 capsule (40 mg total) by mouth in the morning.   rosuvastatin 10 MG tablet Commonly known as: CRESTOR   Sudafed 30 MG tablet Generic drug: pseudoephedrine Take 30 mg by mouth every 4 (four) hours as needed for congestion.   Vitamin D (Ergocalciferol) 1.25 MG (50000 UNIT) Caps capsule Commonly known as: DRISDOL Take 50,000 Units by mouth every 7 (seven) days. On Mondays       Past Medical History:  Diagnosis Date  . Anemia    pernicious anemia - 1991   . Anxiety   . Arthritis    rheumatoid arthritis   . Asthma   . Chronic fatigue   . Cystitis, interstitial   . Depression   . Disc disorder of lumbar region 2013  . Fibromyalgia   . GERD (gastroesophageal reflux disease)   . History of kidney stones   . Hypothyroidism   . Lyme disease    hx of possible   . Neuromuscular disorder ([place])    hx of neuropathy in 1991 due to pernicious anemia   . Osteoporosis   . Pneumonia    hx of   . Rheumatoid arthritis [facility])     Past Surgical History:  Procedure Laterality Date  . APPENDECTOMY  1978  . bladder hydrodistention  1998  . BREAST SURGERY     reduction   . CERVICAL DISC ARTHROPLASTY N/A 04/03/2016   Procedure: CERVICAL SIX CERVICAL SEVEN [place] ARTHROPLASTY;  Surgeon: Jovita Gamma, MD;  Location:  [place];  Service: Neurosurgery;  Laterality: N/A;  left side approach  . COSMETIC SURGERY  2005   face lift  . EXCISION MORTON'S NEUROMA Left 05/08/2017   Procedure: EXCISION MORTON'S NEUROMA Left 3rd webspace;  Surgeon: [name], MD;  Location: [place];  Service: Orthopedics;  Laterality: Left;  . EYE SURGERY     lower eye lids  . LUMBAR LAMINECTOMY/DECOMPRESSION MICRODISCECTOMY  06/10/2011   Procedure: LUMBAR LAMINECTOMY/DECOMPRESSION MICRODISCECTOMY 1 LEVEL;  Surgeon: Hosie Spangle, MD;  Location: [facility];  Service: Neurosurgery;  Laterality: Left;  Lumbar Five Sacral One Lumbar Laminectomy Decompression Microdiscectomy     Review of systems negative except as noted in HPI / PMHx or noted below:  Review of Systems  Constitutional: Negative.   HENT: Negative.   Eyes: Negative.   Respiratory: Negative.   Cardiovascular: Negative.   Gastrointestinal: Negative.   Genitourinary: Negative.   Musculoskeletal: Negative.   Skin: Negative.   Neurological: Negative.   Endo/Heme/Allergies: Negative.   Psychiatric/Behavioral: Negative.      Objective:   Vitals:   05/18/19 1552  BP: (!) 142/80  Pulse: 84  Resp: 16  Temp: 98 F (36.7 C)  SpO2: 96%   Height: 4\' 11"  (149.9 cm)  Weight: 105 lb (47.6 kg)   Physical Exam Constitutional:      Appearance: She is not diaphoretic.  HENT:     Head: Normocephalic.     Right Ear: Tympanic membrane, ear canal and external ear normal.     Left Ear: Tympanic membrane, ear canal and external ear normal.     Nose: Nose normal. No mucosal edema or rhinorrhea.     Mouth/Throat:     Pharynx: Uvula midline. No oropharyngeal exudate.  Eyes:     Conjunctiva/sclera: Conjunctivae normal.  Neck:     Thyroid: No thyromegaly.     Trachea: Trachea normal. No tracheal tenderness or tracheal deviation.  Cardiovascular:     Rate and Rhythm: Normal rate and regular rhythm.     Heart sounds: Normal heart sounds, S1 normal  and S2 normal. No murmur.  Pulmonary:     Effort: No respiratory distress.     Breath sounds: Normal breath sounds. No stridor. No wheezing or rales.  Lymphadenopathy:     Head:     Right side of head: No tonsillar adenopathy.     Left side of head: No tonsillar adenopathy.     Cervical: No cervical adenopathy.  Skin:    Findings: No erythema or rash.     Nails: There is no clubbing.  Neurological:     Mental Status: She is alert.     Diagnostics:    Spirometry was performed and demonstrated an FEV1 of 1.93 at 108 % of predicted.  The patient had an Asthma Control Test with the following results: ACT Total Score: 22.    Assessment and Plan:   1. Pulmonary emphysema, unspecified emphysema type ([place])   2. Asthma, moderate persistent, well-controlled   3. Perennial allergic rhinitis   4. Facial pain syndrome   5. LPRD (laryngopharyngeal reflux disease)     1.  Continue to Treat and prevent inflammation:   A.  Flonase 1-2 sprays each nostril 1 time per day  B.  montelukast 10 mg tablet 1 time per day  C.  Anoro - 1 inhalation 1 time per day  2.  Continue to Treat and prevent reflux:   A.  Continue to consolidate caffeine and alcohol consumption  B.  Omeprazole 40 mg tablet in a.m.  C.  Famotidine 40 mg mg tablet in p.m.  3.  If needed:   A.  OTC antihistamine  B.  Nasal saline  C.  Albuterol HFA 2 inhalations every 4-6 hours  4. Return to clinic in 6 months or earlier if problem.    Overall Fernando is doing well from a respiratory standpoint and from a reflux standpoint and occasionally she has a flare of her left facial pain syndrome but overall has good control of this issue.  What is very disconcerting for her at this point in time is the fear that she has redeveloped chronic Lyme disease and she is going to sort through this issue with the infectious disease tomorrow.  She will remain on anti-inflammatory agents for airway and therapy directed against reflux as noted  above and I will see her back in his clinic in 3 months or earlier if there is a problem.  Allena Katz, MD Allergy / Immunology [place]

## 2019-05-19 ENCOUNTER — Encounter: Payer: Self-pay | Admitting: Allergy and Immunology

## 2019-05-19 ENCOUNTER — Encounter: Payer: Self-pay | Admitting: Internal Medicine

## 2019-05-19 ENCOUNTER — Ambulatory Visit (INDEPENDENT_AMBULATORY_CARE_PROVIDER_SITE_OTHER): Payer: Medicare Other | Admitting: Internal Medicine

## 2019-05-19 VITALS — BP 132/84 | HR 88 | Temp 98.6°F | Wt 104.0 lb

## 2019-05-19 DIAGNOSIS — J328 Other chronic sinusitis: Secondary | ICD-10-CM

## 2019-05-19 NOTE — Progress Notes (Signed)
RFV: new visit for chronic lyme disease  Patient ID: Pamela Larson, female   DOB: [birth year], 74 y.o.   MRN: [id number]  HPI  Pamela Larson reports that she has had numerous 38yrs of issues. In November 1991 had a perfuse rash in [place] but unclear if she was diagnosed with tickborne illness, but had severe fatigue, bed bound. Flu like illness. 1992 - bladder pain started -- none of her providers knew what had happened. Had been on pyridium and macrodantin since then  1994 - started to have peripheral neuropathy - hands and feet. When she was referred to [facility] at the time - when she was diagnosed with pernicious anemia. Ruled out immunoglobulin deficiency   1997 saw urologist at [facility] - dr price -- now sees dr Amalia Hailey at [facility] for interstitial cystitis  2001-2002- step daughter was concern sounds like tickborne illness, who thought she needed to see lyme disease specialist.  In 2000, Saw dr Randol Kern in [place], infectious disease => did lyme disease testing which was negative, only saw him once for initial evaluation, which he recommended that she gets treated for lyme disease despite negative test.  Also referred to dr Jeani Hawking - with chronic fatigue syndrome (for which she got her disability)- but recommended to not empirically treat for abtx, for negative test.  ( She has had numerous studies for tickborne illness which have all been negative)  In 2008 saw dr ricksinger at same office of dr Randol Kern - she was treated with ceftin for abtx course (for sinus infection) - which improved her symptoms. But then have rheumatoid arthritis  Seronegative RA now treated by dr Patrecia Pour. (but improved after 3 weeks of abtx) As well as her bladder pain  Felt better for 4 yrs, but then had recurrence -   In 2015- ? Pneumonia at Urgent care where she had chest pain (PCP didn't think she had pneumonia but UC doctor "I can hear pneumonia" - received 1 day of treatment. Eventually got treated with  levofloxacin and prednisone.   In 2017, went to walk in clinic who gave her 4 wk of doxycycline . Where she felt better after completing but subsequently 2 course of abtx.   In 2019 saw a pulmonologist - due to recurrent pneumonias - saw dr [place] Bells. Mild emphysema/scarring on CT. Now takes inhaler to help symptoms  Now starting to feel poorly again. "what ever the feeling is the antibiotics make it get better"   April-May-June, had 3 course of abtx for sinus,facialy pain/ headache but now improved. Maxillofacial CT does not suggest sinusitis. In the summer -winter felt well - for roughly 7 months, but then had cold in January 2020.   Allergies  Allergen Reactions  . Amoxicillin Nausea And Vomiting     Has patient had a PCN reaction causing immediate rash, facial/tongue/throat swelling, SOB or lightheadedness with hypotension: #  #  #  YES  #  #  #  Has patient had a PCN reaction causing severe rash involving mucus membranes or skin necrosis: No Has patient had a PCN reaction that required hospitalization No Has patient had a PCN reaction occurring within the last 10 years: #  #  #  YES  #  #  #  If all of the above answers are "NO", then may proceed with Cephalosporin use.  . Augmentin [Amoxicillin-Pot Clavulanate] Nausea And Vomiting  . Demerol [Meperidine Hcl] Nausea And Vomiting  . Sulfa Antibiotics Nausea And Vomiting  Outpatient Encounter Medications as of 05/19/2019  Medication Sig  . acetaminophen (TYLENOL) 500 MG tablet Take 1,000 mg by mouth every 6 (six) hours as needed for mild pain. For pain  . albuterol (PROVENTIL HFA;VENTOLIN HFA) 108 (90 Base) MCG/ACT inhaler Inhale 2 puffs into the lungs every 6 (six) hours as needed for wheezing or shortness of breath. For shortness of breath  . ALPRAZolam (XANAX) 0.25 MG tablet Take 0.125 mg by mouth daily as needed for anxiety. Pt takes 1/2 tablet as needed   . ANORO ELLIPTA 62.5-25 MCG/INH AEPB INHALE 1 PUFF INTO THE LUNGS DAILY    . calcium carbonate (OS-CAL - DOSED IN MG OF ELEMENTAL CALCIUM) 1250 MG tablet Take 1 tablet by mouth daily.  . famotidine (PEPCID) 40 MG tablet TAKE 1 TABLET(40 MG) BY MOUTH EVERY EVENING  . FLUoxetine (PROZAC) 20 MG tablet Take 20 mg by mouth 2 (two) times daily.  . fluticasone (FLONASE) 50 MCG/ACT nasal spray Place 1 spray into both nostrils daily as needed for allergies or rhinitis.  Marland Kitchen levothyroxine (SYNTHROID, LEVOTHROID) 100 MCG tablet Take 50-100 mcg by mouth See admin instructions. TAKES 100 MCG DAILY EXCEPT ON THURSDAYS AND TAKES 50 MCG  . Melatonin 3 MG CAPS Take 3 mg by mouth at bedtime as needed (SLEEP).  . montelukast (SINGULAIR) 10 MG tablet Take 1 tablet (10 mg total) by mouth at bedtime.  . Naproxen Sodium (ALEVE) 220 MG CAPS Take by mouth.  Marland Kitchen omeprazole (PRILOSEC) 40 MG capsule Take 1 capsule (40 mg total) by mouth in the morning.  . pseudoephedrine (SUDAFED) 30 MG tablet Take 30 mg by mouth every 4 (four) hours as needed for congestion.  . rosuvastatin (CRESTOR) 10 MG tablet   . umeclidinium-vilanterol (ANORO ELLIPTA) 62.5-25 MCG/INH AEPB Inhale 1 puff into the lungs daily.  . Vitamin D, Ergocalciferol, (DRISDOL) 50000 UNITS CAPS Take 50,000 Units by mouth every 7 (seven) days. On Mondays   No facility-administered encounter medications on file as of 05/19/2019.     Patient Active Problem List   Diagnosis Date Noted  . GERD (gastroesophageal reflux disease) 10/22/2017  . Need for vaccination for Strep pneumoniae 10/22/2017  . Flu vaccine need 10/22/2017  . Healthcare maintenance 10/16/2017  . Upper respiratory infection 09/25/2017  . Sinusitis 09/10/2017  . COPD with chronic bronchitis and emphysema ([place]) 09/03/2017  . Allergic rhinitis 09/03/2017  . HNP (herniated nucleus pulposus), cervical 04/03/2016  . Chronic fatigue   . Hypothyroidism   . Chronic kidney disease   . Arthritis   . Lyme disease   . Fibromyalgia   . Osteoporosis      Health Maintenance Due   Topic Date Due  . Hepatitis C Screening  Never done  . MAMMOGRAM  Never done  . DEXA SCAN  Never done  . 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  Review of Systems 12 point review of system is otherwise negative except what is mentioned above Physical Exam   BP 132/84   Pulse 88   Temp 98.6 F (37 C) (Oral)   Wt 104 lb (47.2 kg)   BMI 21.01 kg/m   gen = a xo by 3 in nad   CBC Lab Results  Component Value Date   WBC 4.1 05/11/2019   RBC 3.54 (L) 05/11/2019   HGB 12.4 05/11/2019   HCT 36.2 05/11/2019   HCT 36.5 05/11/2019   PLT 278.0 05/11/2019  MCV 102.9 (H) 05/11/2019   MCH 34.9 (H) 10/07/2017   MCHC 34.0 05/11/2019   RDW 13.4 05/11/2019   LYMPHSABS 1.0 05/11/2019   MONOABS 0.5 05/11/2019   EOSABS 0.1 05/11/2019    BMET Lab Results  Component Value Date   NA 134 (L) 05/11/2019   K 4.4 05/11/2019   CL 98 05/11/2019   CO2 27 05/11/2019   GLUCOSE 91 05/11/2019   BUN 15 05/11/2019   CREATININE 0.82 05/11/2019   CALCIUM 9.3 05/11/2019   GFRNONAA >60 03/27/2016   GFRAA >60 03/27/2016      Assessment and Plan Discussed with patient that I don't think she has lyme disease based upon her clinical history. Given that she has had recurrent sinusitis, we can recheck  Immunoglobulin to see if any deficiency but otherwise does not appear to need further testing  Discussed with her that I don not think she should be placed on any chronic antibiotics due to high risk for cdifficile. She may have felt better from doxy as some report anti-inflammatory effect  Spent 60 min with patient understanding her clinical history and discussing need for any further work up.

## 2019-06-02 ENCOUNTER — Telehealth: Payer: Medicare Other | Admitting: Internal Medicine

## 2019-06-14 DIAGNOSIS — D225 Melanocytic nevi of trunk: Secondary | ICD-10-CM | POA: Diagnosis not present

## 2019-06-14 DIAGNOSIS — D692 Other nonthrombocytopenic purpura: Secondary | ICD-10-CM | POA: Diagnosis not present

## 2019-06-14 DIAGNOSIS — L821 Other seborrheic keratosis: Secondary | ICD-10-CM | POA: Diagnosis not present

## 2019-06-15 DIAGNOSIS — R519 Headache, unspecified: Secondary | ICD-10-CM | POA: Diagnosis not present

## 2019-06-15 DIAGNOSIS — J310 Chronic rhinitis: Secondary | ICD-10-CM | POA: Diagnosis not present

## 2019-06-15 DIAGNOSIS — J342 Deviated nasal septum: Secondary | ICD-10-CM | POA: Diagnosis not present

## 2019-06-21 DIAGNOSIS — R0781 Pleurodynia: Secondary | ICD-10-CM | POA: Diagnosis not present

## 2019-06-21 DIAGNOSIS — S92354A Nondisplaced fracture of fifth metatarsal bone, right foot, initial encounter for closed fracture: Secondary | ICD-10-CM | POA: Diagnosis not present

## 2019-06-21 DIAGNOSIS — M79672 Pain in left foot: Secondary | ICD-10-CM | POA: Diagnosis not present

## 2019-06-22 ENCOUNTER — Other Ambulatory Visit: Payer: Self-pay | Admitting: Physician Assistant

## 2019-06-22 ENCOUNTER — Ambulatory Visit
Admission: RE | Admit: 2019-06-22 | Discharge: 2019-06-22 | Disposition: A | Discharge status: Home / Self Care | Payer: Medicare Other | Source: Ambulatory Visit | Attending: Physician Assistant | Admitting: Physician Assistant

## 2019-06-22 ENCOUNTER — Other Ambulatory Visit: Payer: Self-pay

## 2019-06-22 ENCOUNTER — Other Ambulatory Visit: Payer: Self-pay | Admitting: Emergency Medicine

## 2019-06-22 DIAGNOSIS — R0781 Pleurodynia: Secondary | ICD-10-CM

## 2019-06-22 DIAGNOSIS — R079 Chest pain, unspecified: Secondary | ICD-10-CM | POA: Diagnosis not present

## 2019-06-29 DIAGNOSIS — J342 Deviated nasal septum: Secondary | ICD-10-CM | POA: Diagnosis not present

## 2019-06-29 DIAGNOSIS — G501 Atypical facial pain: Secondary | ICD-10-CM | POA: Diagnosis not present

## 2019-06-29 DIAGNOSIS — R5382 Chronic fatigue, unspecified: Secondary | ICD-10-CM | POA: Diagnosis not present

## 2019-06-29 DIAGNOSIS — J310 Chronic rhinitis: Secondary | ICD-10-CM | POA: Diagnosis not present

## 2019-07-05 DIAGNOSIS — J9811 Atelectasis: Secondary | ICD-10-CM | POA: Diagnosis not present

## 2019-07-05 DIAGNOSIS — M810 Age-related osteoporosis without current pathological fracture: Secondary | ICD-10-CM | POA: Diagnosis not present

## 2019-07-05 DIAGNOSIS — S92309A Fracture of unspecified metatarsal bone(s), unspecified foot, initial encounter for closed fracture: Secondary | ICD-10-CM | POA: Diagnosis not present

## 2019-07-05 DIAGNOSIS — R0781 Pleurodynia: Secondary | ICD-10-CM | POA: Diagnosis not present

## 2019-07-05 DIAGNOSIS — M438X6 Other specified deforming dorsopathies, lumbar region: Secondary | ICD-10-CM | POA: Diagnosis not present

## 2019-07-05 DIAGNOSIS — W19XXXA Unspecified fall, initial encounter: Secondary | ICD-10-CM | POA: Diagnosis not present

## 2019-07-07 DIAGNOSIS — S92345D Nondisplaced fracture of fourth metatarsal bone, left foot, subsequent encounter for fracture with routine healing: Secondary | ICD-10-CM | POA: Diagnosis not present

## 2019-07-07 DIAGNOSIS — M79672 Pain in left foot: Secondary | ICD-10-CM | POA: Diagnosis not present

## 2019-07-28 DIAGNOSIS — S92354D Nondisplaced fracture of fifth metatarsal bone, right foot, subsequent encounter for fracture with routine healing: Secondary | ICD-10-CM | POA: Diagnosis not present

## 2019-07-28 DIAGNOSIS — M79672 Pain in left foot: Secondary | ICD-10-CM | POA: Diagnosis not present

## 2019-08-27 ENCOUNTER — Encounter: Payer: Self-pay | Admitting: Adult Health

## 2019-08-27 ENCOUNTER — Ambulatory Visit (INDEPENDENT_AMBULATORY_CARE_PROVIDER_SITE_OTHER): Payer: Medicare Other | Admitting: Adult Health

## 2019-08-27 ENCOUNTER — Other Ambulatory Visit: Payer: Self-pay

## 2019-08-27 ENCOUNTER — Telehealth: Payer: Self-pay | Admitting: Acute Care

## 2019-08-27 DIAGNOSIS — J309 Allergic rhinitis, unspecified: Secondary | ICD-10-CM | POA: Diagnosis not present

## 2019-08-27 DIAGNOSIS — J310 Chronic rhinitis: Secondary | ICD-10-CM | POA: Diagnosis not present

## 2019-08-27 DIAGNOSIS — J449 Chronic obstructive pulmonary disease, unspecified: Secondary | ICD-10-CM | POA: Diagnosis not present

## 2019-08-27 DIAGNOSIS — J342 Deviated nasal septum: Secondary | ICD-10-CM | POA: Diagnosis not present

## 2019-08-27 DIAGNOSIS — J343 Hypertrophy of nasal turbinates: Secondary | ICD-10-CM | POA: Diagnosis not present

## 2019-08-27 MED ORDER — PREDNISONE 20 MG PO TABS: 20.0000 mg | ORAL_TABLET | Freq: Every day | ORAL | 0 refills | Status: DC

## 2019-08-27 MED ORDER — AZITHROMYCIN 250 MG PO TABS: ORAL_TABLET | ORAL | 0 refills | Status: AC

## 2019-08-27 NOTE — Assessment & Plan Note (Signed)
Mild exacerbation with URI.  Plan  Patient Instructions  Prednisone 20mg  daily for 4 days -take with food.  Zpack to have on hold if symptoms worsen with discolored mucus or does not improve.  Mucinex DM Twice daily  As needed  Cough/congestion  Fluids and rest  Continue on ANORO daily  Albuterol inhaler or neb As needed   Follow up with Dr. Shearon Stalls in 3 months and As needed   Please contact office for sooner follow up if symptoms do not improve or worsen or seek emergency care    '

## 2019-08-27 NOTE — Assessment & Plan Note (Signed)
Continue on sinus regimen.  Saline nasal spray as needed  Plan    Patient Instructions  Prednisone 20mg  daily for 4 days -take with food.  Zpack to have on hold if symptoms worsen with discolored mucus or does not improve.  Mucinex DM Twice daily  As needed  Cough/congestion  Fluids and rest  Continue on ANORO daily  Albuterol inhaler or neb As needed   Follow up with Dr. Shearon Stalls in 3 months and As needed   Please contact office for sooner follow up if symptoms do not improve or worsen or seek emergency care

## 2019-08-27 NOTE — Progress Notes (Signed)
@Patient  ID: Pamela Larson, female    DOB: 26-Aug-1945, 74 y.o.   MRN: [id number]  Chief Complaint  Patient presents with  . Acute Visit    Cough     Referring provider: Marton Redwood, MD  HPI: 74 year old female former smoker (quit 1992) followed for emphysema prone to recurrent exacerbations.  Medical history significant for rheumatoid arthritis and pernicious anemia. Reports history of Lyme's disease  TEST/EVENTS :  PFT: September 2019 ratio 80%, FEV1 2.10 L 110% predicted, total lung capacity 4.43 L 99% predicted, DLCO 15.92 mL 84% predicted  Labs: 10/2017 eosinophil count 0, IgE August 27th 2019 7  Spirometrywas performed and demonstrated an FEV1 of 1.71at 86% of predicted.  08/27/2019 Acute OV : Cough  Patient complains over the last 10 days she has had nasal congestion postnasal drainage and now increased productive cough.  She has been using over-the-counter cold medicines and Mucinex.  Has had some improvement but symptoms continue to persist and she has productive yellow mucus.  She is worried because is getting close to the weekend I does not want to be ongoing sick.  She is also felt some tightness and shortness of breath.  She denies any hemoptysis fever, loss of taste or smell.  Covid vaccines are up-to-date.  She remains on Anoro daily. Appetite is good with no nausea vomiting diarrhea. Allergies  Allergen Reactions  . Amoxicillin Nausea And Vomiting     Has patient had a PCN reaction causing immediate rash, facial/tongue/throat swelling, SOB or lightheadedness with hypotension: #  #  #  YES  #  #  #  Has patient had a PCN reaction causing severe rash involving mucus membranes or skin necrosis: No Has patient had a PCN reaction that required hospitalization No Has patient had a PCN reaction occurring within the last 10 years: #  #  #  YES  #  #  #  If all of the above answers are "NO", then may proceed with Cephalosporin use.  . Augmentin [Amoxicillin-Pot  Clavulanate] Nausea And Vomiting  . Demerol [Meperidine Hcl] Nausea And Vomiting  . Sulfa Antibiotics Nausea And Vomiting    Immunization History  Administered Date(s) Administered  . Influenza, High Dose Seasonal PF 12/07/2016, 11/19/2017, 12/05/2018  . PFIZER SARS-COV-2 Vaccination 03/19/2019, 04/13/2019  . Pneumococcal Polysaccharide-23 10/22/2017  . Tdap 11/01/2016  . Zoster Recombinat (Shingrix) 12/26/2016, 06/12/2017    Past Medical History:  Diagnosis Date  . Anemia    pernicious anemia - 1991   . Anxiety   . Arthritis    rheumatoid arthritis   . Asthma   . Chronic fatigue   . Cystitis, interstitial   . Depression   . Disc disorder of lumbar region 2013  . Fibromyalgia   . GERD (gastroesophageal reflux disease)   . History of kidney stones   . Hypothyroidism   . Lyme disease    hx of possible   . Neuromuscular disorder ([place])    hx of neuropathy in 1991 due to pernicious anemia   . Osteoporosis   . Pneumonia    hx of   . Rheumatoid arthritis ([place])     Tobacco History: Social History   Tobacco Use  Smoking Status Former Smoker  . Packs/day: 2.00  . Years: 20.00  . Pack years: 40.00  . Types: Cigarettes  . Quit date: 02/11/1990  . Years since quitting: 29.5  Smokeless Tobacco Never Used   Counseling given: Not Answered   Outpatient Medications Prior to  Visit  Medication Sig Dispense Refill  . acetaminophen (TYLENOL) 500 MG tablet Take 1,000 mg by mouth every 6 (six) hours as needed for mild pain. For pain    . albuterol (PROVENTIL HFA;VENTOLIN HFA) 108 (90 Base) MCG/ACT inhaler Inhale 2 puffs into the lungs every 6 (six) hours as needed for wheezing or shortness of breath. For shortness of breath 1 Inhaler 1  . ALPRAZolam (XANAX) 0.25 MG tablet Take 0.125 mg by mouth daily as needed for anxiety. Pt takes 1/2 tablet as needed     . ANORO ELLIPTA 62.5-25 MCG/INH AEPB INHALE 1 PUFF INTO THE LUNGS DAILY 60 each 5  . calcium carbonate (OS-CAL - DOSED IN MG  OF ELEMENTAL CALCIUM) 1250 MG tablet Take 1 tablet by mouth daily.    . famotidine (PEPCID) 40 MG tablet TAKE 1 TABLET(40 MG) BY MOUTH EVERY EVENING 90 tablet 5  . FLUoxetine (PROZAC) 20 MG tablet Take 20 mg by mouth 2 (two) times daily.    . fluticasone (FLONASE) 50 MCG/ACT nasal spray Place 1 spray into both nostrils daily as needed for allergies or rhinitis.    Marland Kitchen levothyroxine (SYNTHROID, LEVOTHROID) 100 MCG tablet Take 50-100 mcg by mouth See admin instructions. TAKES 100 MCG DAILY EXCEPT ON THURSDAYS AND TAKES 50 MCG    . Melatonin 3 MG CAPS Take 3 mg by mouth at bedtime as needed (SLEEP).    . montelukast (SINGULAIR) 10 MG tablet Take 1 tablet (10 mg total) by mouth at bedtime. 30 tablet 5  . Naproxen Sodium (ALEVE) 220 MG CAPS Take by mouth.    Marland Kitchen omeprazole (PRILOSEC) 40 MG capsule Take 1 capsule (40 mg total) by mouth in the morning. 30 capsule 5  . pseudoephedrine (SUDAFED) 30 MG tablet Take 30 mg by mouth every 4 (four) hours as needed for congestion.    . rosuvastatin (CRESTOR) 10 MG tablet     . umeclidinium-vilanterol (ANORO ELLIPTA) 62.5-25 MCG/INH AEPB Inhale 1 puff into the lungs daily. 1 each 11  . Vitamin D, Ergocalciferol, (DRISDOL) 50000 UNITS CAPS Take 50,000 Units by mouth every 7 (seven) days. On Mondays     No facility-administered medications prior to visit.     Review of Systems:   Constitutional:   No  weight loss, night sweats,  Fevers, chills,  +fatigue, or  lassitude.  HEENT:   No headaches,  Difficulty swallowing,  Tooth/dental problems, or  Sore throat,                No sneezing, itching, ear ache,  +nasal congestion, post nasal drip,   CV:  No chest pain,  Orthopnea, PND, swelling in lower extremities, anasarca, dizziness, palpitations, syncope.   GI  No heartburn, indigestion, abdominal pain, nausea, vomiting, diarrhea, change in bowel habits, loss of appetite, bloody stools.   Resp:   No chest wall deformity  Skin: no rash or lesions.  GU: no  dysuria, change in color of urine, no urgency or frequency.  No flank pain, no hematuria   MS:  No joint pain or swelling.  No decreased range of motion.  No back pain.    Physical Exam  BP 132/84 (BP Location: Left Arm, Cuff Size: Normal)   Pulse 73   Wt 104 lb 9.6 oz (47.4 kg)   SpO2 99%   BMI 21.13 kg/m   GEN: A/Ox3; pleasant , NAD, well nourished    HEENT:  [place]/AT,    NOSE-clear, THROAT-clear, no lesions, no postnasal drip or exudate noted.  NECK:  Supple w/ fair ROM; no JVD; normal carotid impulses w/o bruits; no thyromegaly or nodules palpated; no lymphadenopathy.    RESP  Clear  P & A; w/o, wheezes/ rales/ or rhonchi. no accessory muscle use, no dullness to percussion  CARD:  RRR, no m/r/g, no peripheral edema, pulses intact, no cyanosis or clubbing.  GI:   Soft & nt; nml bowel sounds; no organomegaly or masses detected.   Musco: Warm bil, no deformities or joint swelling noted.   Neuro: alert, no focal deficits noted.    Skin: Warm, no lesions or rashes    Lab Results:   BMET  ProBNP No results found for: PROBNP  Imaging: No results found.    PFT Results Latest Ref Rng & Units 10/16/2017  FVC-Pre L 2.60  FVC-Predicted Pre % 103  FVC-Post L 2.55  FVC-Predicted Post % 101  Pre FEV1/FVC % % 80  Post FEV1/FCV % % 82  FEV1-Pre L 2.08  FEV1-Predicted Pre % 110  FEV1-Post L 2.10  DLCO UNC% % 84  DLCO COR %Predicted % 91  TLC L 4.43  TLC % Predicted % 99  RV % Predicted % 85    No results found for: NITRICOXIDE      Assessment & Plan:   COPD with chronic bronchitis and emphysema (HCC) Mild exacerbation with URI.  Plan  Patient Instructions  Prednisone 20mg  daily for 4 days -take with food.  Zpack to have on hold if symptoms worsen with discolored mucus or does not improve.  Mucinex DM Twice daily  As needed  Cough/congestion  Fluids and rest  Continue on ANORO daily  Albuterol inhaler or neb As needed   Follow up with Dr. Shearon Stalls in 3  months and As needed   Please contact office for sooner follow up if symptoms do not improve or worsen or seek emergency care    '   Allergic rhinitis Continue on sinus regimen.  Saline nasal spray as needed  Plan    Patient Instructions  Prednisone 20mg  daily for 4 days -take with food.  Zpack to have on hold if symptoms worsen with discolored mucus or does not improve.  Mucinex DM Twice daily  As needed  Cough/congestion  Fluids and rest  Continue on ANORO daily  Albuterol inhaler or neb As needed   Follow up with Dr. Shearon Stalls in 3 months and As needed   Please contact office for sooner follow up if symptoms do not improve or worsen or seek emergency care         Rexene Edison, NP 08/27/2019

## 2019-08-27 NOTE — Telephone Encounter (Signed)
Pt c/o chest tightness, prod cough with dark green mucus, sinus congestion, hoarseness PND X2 weeks.  Chest tightness started today.    Taking Mucinex, Sudafed, otc cough syrup, tylenol  Denies fever, sharp chest pains, chills.    Pt requesting appt, would like to have someone listen to her to see if she needs an abx.  Scheduled to see TP today at 3:30.  Nothing further needed at this time- will close encounter.

## 2019-08-27 NOTE — Patient Instructions (Signed)
Prednisone 20mg  daily for 4 days -take with food.  Zpack to have on hold if symptoms worsen with discolored mucus or does not improve.  Mucinex DM Twice daily  As needed  Cough/congestion  Fluids and rest  Continue on ANORO daily  Albuterol inhaler or neb As needed   Follow up with Dr. Shearon Stalls in 3 months and As needed   Please contact office for sooner follow up if symptoms do not improve or worsen or seek emergency care

## 2019-08-31 ENCOUNTER — Telehealth: Payer: Self-pay | Admitting: Adult Health

## 2019-08-31 DIAGNOSIS — J449 Chronic obstructive pulmonary disease, unspecified: Secondary | ICD-10-CM

## 2019-08-31 DIAGNOSIS — R05 Cough: Secondary | ICD-10-CM | POA: Diagnosis not present

## 2019-08-31 DIAGNOSIS — R079 Chest pain, unspecified: Secondary | ICD-10-CM | POA: Diagnosis not present

## 2019-08-31 NOTE — Telephone Encounter (Signed)
Pt called back about this. Pt feels sickness has "gone down in her chest" please advise.

## 2019-08-31 NOTE — Telephone Encounter (Signed)
Spoke with the pt and notified of response per Aaron Edelman and I have her scheduled for in office appt with TP for 09/02/19 at 12 noon

## 2019-08-31 NOTE — Telephone Encounter (Signed)
Patient is feeling worse since last ov 08/27/19 with TP. she has completed prednisone and has one day of abx left. She having body aches and chills . She is coughing up clear phlegm. She feels she is wheezing some.   Temp 98.5 feels this is high for her.    Brain please advise

## 2019-08-31 NOTE — Telephone Encounter (Signed)
08/31/2019  Sorry to hear the patient is having the symptoms.  I have reviewed the patient's chart where she last completed follow-up with our office on 08/27/2019 with TP NP.  At that time she was treated with a Z-Pak as well as with prednisone.  She was recommended to remain on Anoro Ellipta.  She was encouraged to follow-up with Dr. Shearon Stalls in 3 months.  Patient is also been followed by Dr. Graylon Good with infectious disease.  Last appointment was in April/2021.  Looks like they recommended lab work to assess immunoglobulins.  It does not look like this is been completed.  I would recommend the patient get scheduled for a follow-up with TP NP or SG NP considering they have most recently seen her.  I would emphasize that she continues to take her medications as previously recommended.  She can finish the prednisone as well as the azithromycin.  At the upcoming office visit she can have a stat chest x-ray performed prior to that visit.  If there is nothing available within the next 7 days with either of these providers please send a message back to me.  Patient also should be following back up with infectious disease to see why the lab work that they recommended back in April was never completed.  They may need to coordinate having the patient come in to obtain these labs.  This is important in assessing whether or not she has any deficiencies in her immune system.  I would also encourage that she keep follow-up with allergy asthma Dr. Neldon Mc.  If symptoms worsen in the meantime she need to seek in person evaluation in urgent care, primary care or emergency room.  Please also make sure that the patient is already scheduled with a follow-up with Dr. Shearon Stalls in [place], [place]

## 2019-09-02 ENCOUNTER — Ambulatory Visit: Payer: Medicare Other | Admitting: Adult Health

## 2019-09-15 ENCOUNTER — Other Ambulatory Visit: Payer: Self-pay | Admitting: Allergy and Immunology

## 2019-09-15 DIAGNOSIS — B349 Viral infection, unspecified: Secondary | ICD-10-CM | POA: Diagnosis not present

## 2019-09-15 DIAGNOSIS — Z1152 Encounter for screening for COVID-19: Secondary | ICD-10-CM | POA: Diagnosis not present

## 2019-09-17 DIAGNOSIS — J0101 Acute recurrent maxillary sinusitis: Secondary | ICD-10-CM | POA: Diagnosis not present

## 2019-09-17 DIAGNOSIS — J342 Deviated nasal septum: Secondary | ICD-10-CM | POA: Diagnosis not present

## 2019-09-17 DIAGNOSIS — J310 Chronic rhinitis: Secondary | ICD-10-CM | POA: Diagnosis not present

## 2019-09-22 ENCOUNTER — Telehealth: Payer: Self-pay | Admitting: Acute Care

## 2019-09-22 NOTE — Telephone Encounter (Signed)
Called and spoke with pt who stated she would like SG to call her. Asked her the reason for her needing to speak with Judson Roch and at first she did not want to tell me but then told me she has had a recent scare with SOB.  Pt was diagnosed with acute bronchitis last week. States she has been seeing Dr. Benjamine Mola. Pt states she has had some SOB x2 days now and states she has not had SOB for 2 years now until now.  Pt stated she has had congestion and was placed on abx 7/9 and was doing better but then symptoms started up again and then she was seen by Dr. Benjamine Mola 8/6. Pt did not have a sinus infection per Dr. Benjamine Mola, just had a lot of congestion. Pt stated that she has been seeing Dr. Benjamine Mola the last few months.   Pt was placed on clindamycin 8/6 and is going to be on it x10 days.  Pt stated due to the SOB that she began to have, it scared her due to her not having any SOB x2 years. Pt wanted to schedule an appt with SG which I have done for pt. Pt also wanted me to pass this info along to SG as an FYI so she can be aware of it for when she comes in to see her on 9/1.  Routing to Judson Roch as an Pharmacist, hospital. Nothing further needed.

## 2019-09-22 NOTE — Telephone Encounter (Signed)
Pt returning missed call.   Please advise.  

## 2019-09-22 NOTE — Telephone Encounter (Signed)
Called pt but unable to reach. Left message for her to return call. 

## 2019-09-23 DIAGNOSIS — D510 Vitamin B12 deficiency anemia due to intrinsic factor deficiency: Secondary | ICD-10-CM | POA: Diagnosis not present

## 2019-09-23 NOTE — Telephone Encounter (Signed)
Thanks so much [place]!! This will help when I see her.

## 2019-09-27 ENCOUNTER — Telehealth: Payer: Self-pay | Admitting: Acute Care

## 2019-09-27 MED ORDER — ALBUTEROL SULFATE HFA 108 (90 BASE) MCG/ACT IN AERS: 2.0000 | INHALATION_SPRAY | Freq: Four times a day (QID) | RESPIRATORY_TRACT | 10 refills | Status: DC | PRN

## 2019-09-27 NOTE — Telephone Encounter (Signed)
Called and spoke with pt letting her know the info stated by SG and she verbalized understanding. Nothing further needed.

## 2019-09-27 NOTE — Telephone Encounter (Signed)
Patient called wants to let Judson Roch know she has finished her antibiotic from Dr. Benjamine Mola who said she had bronchitis. She is feeling much better after taking,  But she is still having the chest tightness.  Patient has no in date albuterol so I refilled that for her, she is wondering if she needs anything else to help with her chest tightness.  She is taking her Anoro daily as well.   Sarah please advise on any recommendations.

## 2019-09-27 NOTE — Telephone Encounter (Signed)
If the chest tightness does not improve with albuterol, she will need to be seen. Have her try the albuterol, and then we can reassess if no better. thanks

## 2019-09-29 ENCOUNTER — Encounter: Payer: Self-pay | Admitting: Pulmonary Disease

## 2019-09-29 ENCOUNTER — Ambulatory Visit (INDEPENDENT_AMBULATORY_CARE_PROVIDER_SITE_OTHER): Payer: Medicare Other | Admitting: Pulmonary Disease

## 2019-09-29 ENCOUNTER — Other Ambulatory Visit: Payer: Self-pay

## 2019-09-29 VITALS — BP 118/68 | HR 89 | Temp 97.4°F | Ht 60.0 in | Wt 104.0 lb

## 2019-09-29 DIAGNOSIS — J449 Chronic obstructive pulmonary disease, unspecified: Secondary | ICD-10-CM

## 2019-09-29 DIAGNOSIS — J342 Deviated nasal septum: Secondary | ICD-10-CM | POA: Diagnosis not present

## 2019-09-29 DIAGNOSIS — J309 Allergic rhinitis, unspecified: Secondary | ICD-10-CM | POA: Diagnosis not present

## 2019-09-29 DIAGNOSIS — K219 Gastro-esophageal reflux disease without esophagitis: Secondary | ICD-10-CM | POA: Diagnosis not present

## 2019-09-29 NOTE — Assessment & Plan Note (Addendum)
No eosinophilia on lab work  Plan: Continue Anoro Ellipta Sputum cups provided today Patient to utilize sputum cups if she becomes acutely ill, explained to patient that she needs to be produce at least a quarter size of sputum in order for her to be an adequate sample 2 to 59-month follow-up with Dr. Shearon Stalls

## 2019-09-29 NOTE — Patient Instructions (Addendum)
You were seen today by Lauraine Rinne, NP  for:   We will request records from your ear nose and throat doctor today please sign the forms provided  1. COPD with chronic bronchitis and emphysema ([place])  - Respiratory or Resp and Sputum Culture; Future - Culture, fungus without smear; Future - AFB Culture & Smear; Future  Anoro Ellipta  >>> Take 1 puff daily in the morning right when you wake up >>>Rinse your mouth out after use >>>This is a daily maintenance inhaler, NOT a rescue inhaler >>>Contact our office if you are having difficulties affording or obtaining this medication >>>It is important for you to be able to take this daily and not miss any doses    Note your daily symptoms > remember "red flags" for COPD:   >>>Increase in cough >>>increase in sputum production >>>increase in shortness of breath or activity  intolerance.   If you notice these symptoms, please call the office to be seen.   2. Allergic rhinitis, unspecified seasonality, unspecified trigger  Start nasal saline rinses twice daily Use distilled water Shake well Get bottle lukewarm like a baby bottle  Continue Flonase  Continue Singulair  Continue follow-up with allergy asthma Dr. Neldon Mc  Please schedule a follow-up with ear nose and throat - Dr Benjamine Mola   3. Gastroesophageal reflux disease, unspecified whether esophagitis present  Omeprazole 40 mg tablet  >>>Please take 1 tablet daily 15 minutes to 30 minutes before your first meal of the day as well as before your other medications >>>Try to take at the same time each day >>>take this medication daily   We recommend today:  Orders Placed This Encounter  Procedures  . Respiratory or Resp and Sputum Culture    Standing Status:   Future    Standing Expiration Date:   09/28/2020  . Culture, fungus without smear    Standing Status:   Future    Standing Expiration Date:   09/28/2020  . AFB Culture & Smear    Standing Status:   Future    Standing  Expiration Date:   09/28/2020   Orders Placed This Encounter  Procedures  . Respiratory or Resp and Sputum Culture  . Culture, fungus without smear  . AFB Culture & Smear   No orders of the defined types were placed in this encounter.   Follow Up:    Return in about 2 months (around 11/29/2019), or if symptoms worsen or fail to improve, for Follow up with Dr. Shearon Stalls.   Please do your part to reduce the spread of COVID-19:      Reduce your risk of any infection  and COVID19 by using the similar precautions used for avoiding the common cold or flu:  Marland Kitchen Wash your hands often with soap and warm water for at least 20 seconds.  If soap and water are not readily available, use an alcohol-based hand sanitizer with at least 60% alcohol.  . If coughing or sneezing, cover your mouth and nose by coughing or sneezing into the elbow areas of your shirt or coat, into a tissue or into your sleeve (not your hands). Langley Gauss A MASK when in public  . Avoid shaking hands with others and consider head nods or verbal greetings only. . Avoid touching your eyes, nose, or mouth with unwashed hands.  . Avoid close contact with people who are sick. . Avoid places or events with large numbers of people in one location, like concerts or sporting events. . If  you have some symptoms but not all symptoms, continue to monitor at home and seek medical attention if your symptoms worsen. . If you are having a medical emergency, call 911.   [place] / e-Visit: [email or web address]         [facility]: [facility]: [phone]                   [facility]: [phone]     It is flu season:   >>> Best ways to protect herself from the flu: Receive the yearly flu vaccine, practice good hand hygiene washing with soap and also using hand sanitizer when available,  eat a nutritious meals, get adequate rest, hydrate appropriately   Please contact the office if your symptoms worsen or you have concerns that you are not improving.   Thank you for choosing [facility] for your healthcare, and for allowing [place] to partner with you on your healthcare journey. I am thankful to be able to provide care to you today.   Wyn Quaker FNP-C

## 2019-09-29 NOTE — Assessment & Plan Note (Signed)
We will request records from ear nose and throat today Schedule a follow-up with ear nose and throat If you are requiring additional antibiotics due to suspected sinusitis I would recommend that they perform a culture to check for abnormal bacteria, fungal or atypical organisms within your sinus cavity

## 2019-09-29 NOTE — Assessment & Plan Note (Addendum)
Postnasal drip and rhinorrhea on physical exam today  Plan: Continue saline rinses, would recommend Nettie pot for larger lavage and improve nasal drainage I believe this may help thin out the persistent congestion the patient is currently dealing with Would recommend patient present back to ear nose and throat for a nasal/sinus culture at ENTs discretion Continue Flonase

## 2019-09-29 NOTE — Progress Notes (Signed)
@Patient  ID: Pamela Larson, female    DOB: 29-Apr-1945, 74 y.o.   MRN: [id number]  Chief Complaint  Patient presents with  . Follow-up    sob-slightly better with Ventolin using two times a day,dry cough,hoarseness    Referring provider: Marton Redwood, MD  HPI:  74 year old female former smoker followed in our office for recurrent bronchitis.  Patient is also followed by Dr. Neldon Larson for allergy.  PMH: Anxiety, GERD, chronic fatigue syndrome, fibromyalgia, Lyme disease Smoker/ Smoking History: Former smoker.  40 pack years. Maintenance: Anoro Ellipta Pt of: Dr. Shearon Larson  09/29/2019  - Visit   74 year old female former smoker followed in our office for COPD.  Patient is followed in our office by Dr. Shearon Larson.  Last follow-up with Dr. Shearon Larson was in November/2020.  An excerpt of that assessment and plan is listed below:  Assessment:  GERD Perennial Allergic Rhinitis Chronic bronchitis History of tobacco use, 40-pack-year  Plan/Recommendations:  [name] presents today with worsening of her perennial allergic rhinitis.  She does not appear to have any evidence of chronic bronchitis related exacerbation.  Reflux symptoms are controlled.  Continues to abstain from tobacco.  She is already on a fairly aggressive rhinitis therapy, with Nasacort, Claritin, montelukast, saline nasal lavage.    [name] has been seen by allergy, pulmonary and ENT essentially monthly for the last 18 months, frequently requiring courses of azithromycin may be deleterious to her health by contributing to bacterial resistance, prolonged QT, and hearing loss.  I am sincerely doubtful that her current episode of sinusitis is related to a bacterial infection.  This appears to be the only solution which will satisfy her.  If this is not satisfactory in relieving her symptoms, I would recommend switching her loratadine to a different antihistamine such as cetirizine, or chlorpheniramine.  I also recommend taking  her Nasacort nasal spray 1 spray in each nose in the morning and then again at night, rather than taking 2 sprays both in the morning.    Return to Care: I recommend she follow-up with Pamela Form, Larson.   Pamela Llamas, MD Pulmonary and [place]  Patient has had multiple visits with our office this year regarding recurrent sinusitis.  She is also followed by ear nose and throat Dr. Benjamine Larson.  She is also been seen by Dr. Graylon Larson with infectious disease.  Plan of care from Dr. Crissie Larson office visit in April/2021 is as follows:  Assessment and Plan Discussed with patient that I don't think she has lyme disease based upon her clinical history. Given that she has had recurrent sinusitis, we can recheck  Immunoglobulin to see if any deficiency but otherwise does not appear to need further testing  Discussed with her that I don not think she should be placed on any chronic antibiotics due to high risk for cdifficile. She may have felt better from doxy as some report anti-inflammatory effect  Spent 60 min with patient understanding her clinical history and discussing need for any further work up.  Note: Patient's immunoglobulin lab work was never completed from infectious disease  Patient contacted our office on 09/27/2019 reporting that she feels that she has bronchitis symptoms and that she is finished her antibiotic from ear nose and throat.  Patient presenting to our office today.  She reports that she feels that she is "on the mend".  Unfortunately we cannot see the ear nose and throat office notes from Dr. Benjamine Larson.  Patient reporting that she  has had monthly follow-up with their team.  She reports that she was last seen by their office earlier this month on 09/17/2019 where it was felt that she had bronchitis and she was treated with clindamycin for 10 days.  And a prednisone taper.  She also reports that she was treated with clindamycin in July/2021  by the PA at Dr. Deeann Larson office.  Patient reports that she does not have follow-up with ear nose and throat at this time.  Patient reports today that she feels that there is concerned that she may have a resistant bacteria than her sinuses.  To her knowledge she has not had a culture drawn yet.  We will discuss and review this today.  Patient reports that ear nose and throat has told her she is a severely deviated septum.  This is the larger component to some of her breathing issues.  She reports that the most recent antibiotic use was the clindamycin in July/2021 and clindamycin again in August/2021.  She never started the Z-Pak and prednisone that was prescribed by Pamela Larson.  Patient feels that symptoms have improved.  Over the past couple of days she has felt that her lungs have hurt some.  She continues to be adherent to using saline irrigation.  She has a spray bottle.  She does not do a full Nettie pot.  She reports that she never "got Larson at that".  She reports adherence to saline irrigation spray and then Flonase.  Patient is also followed by Dr. Neldon Larson with allergy asthma.  Patient was last seen in April/2021.  An excerpt of that discharge summary is listed below:   1. Pulmonary emphysema, unspecified emphysema type ([place])   2. Asthma, moderate persistent, well-controlled   3. Perennial allergic rhinitis   4. Facial pain syndrome   5. LPRD (laryngopharyngeal reflux disease)     1.  Continue to Treat and prevent inflammation:              A.  Flonase 1-2 sprays each nostril 1 time per day             B.  montelukast 10 mg tablet 1 time per day             C.  Anoro - 1 inhalation 1 time per day  2.  Continue to Treat and prevent reflux:              A.  Continue to consolidate caffeine and alcohol consumption             B.  Omeprazole 40 mg tablet in a.m.             C.  Famotidine 40 mg mg tablet in p.m.  3.  If needed:              A.  OTC antihistamine             B.   Nasal saline             C.  Albuterol HFA 2 inhalations every 4-6 hours  4. Return to clinic in 6 months or earlier if problem.      Questionaires / Pulmonary Flowsheets:   ACT:  Asthma Control Test ACT Total Score  05/18/2019 22  11/11/2017 24    [facility]: No flowsheet data found.  [facility]:  No flowsheet data found.  Tests:   05/11/19 - CBC with diff - eos relative 1.3, eos absolute 0.1  05/21/2019-spirometry-FVC 2.54 (  107% predicted), ratio 0.76, FEV1 1.93 (108% predicted)  06/15/2019-nasal endoscopy-ear nose and throat-left septal deviation is noted with mild rhinitis, no purulent drainage, polyps or suspicious mass or lesion is noted on nasal endoscopy,  06/23/2019-chest x-ray-stable mild hyperinflation and central peribronchial thickening, indicating COPD and/or asthma  07/03/2018-CT maxillofacial-negative exam  08/28/2017-CT chest high-res-no findings to suggest interstitial lung disease, mild diffuse bronchial wall thickening with very mild paraseptal emphysema  10/16/2017-pulmonary function test-FVC 2.6 (103% predicted), postbronchodilator ratio 82, postbronchodilator FEV1 2.10 (110% predicted), no bronchodilator response, DLCO 15.92 (84% predicted) 08/22/2017-respiratory sputum-growth of normal flora 08/22/2017-AFB-no Mycobacterium species identified 08/22/2017-fungal-no fungal elements seen   FENO:  No results found for: NITRICOXIDE  PFT: PFT Results Latest Ref Rng & Units 10/16/2017  FVC-Pre L 2.60  FVC-Predicted Pre % 103  FVC-Post L 2.55  FVC-Predicted Post % 101  Pre FEV1/FVC % % 80  Post FEV1/FCV % % 82  FEV1-Pre L 2.08  FEV1-Predicted Pre % 110  FEV1-Post L 2.10  DLCO uncorrected ml/min/mmHg 15.92  DLCO UNC% % 84  DLCO corrected ml/min/mmHg 15.30  DLCO COR %Predicted % 81  DLVA Predicted % 91  TLC L 4.43  TLC % Predicted % 99  RV % Predicted % 85    WALK:  No flowsheet data found.  Imaging: No results found.  Lab Results:  CBC    Component Value  Date/Time   WBC 4.1 05/11/2019 1445   RBC 3.54 (L) 05/11/2019 1445   HGB 12.4 05/11/2019 1445   HGB 14.8 10/07/2017 1442   HCT 36.2 05/11/2019 1445   HCT 36.5 05/11/2019 1445   PLT 278.0 05/11/2019 1445   PLT 326 10/07/2017 1442   MCV 102.9 (H) 05/11/2019 1445   MCV 102 (H) 10/07/2017 1442   MCH 34.9 (H) 10/07/2017 1442   MCH 34.3 (H) 03/27/2016 1146   MCHC 34.0 05/11/2019 1445   RDW 13.4 05/11/2019 1445   RDW 13.5 10/07/2017 1442   LYMPHSABS 1.0 05/11/2019 1445   LYMPHSABS 1.1 10/07/2017 1442   MONOABS 0.5 05/11/2019 1445   EOSABS 0.1 05/11/2019 1445   EOSABS 0.0 10/07/2017 1442   BASOSABS 0.0 05/11/2019 1445   BASOSABS 0.0 10/07/2017 1442    BMET    Component Value Date/Time   NA 134 (L) 05/11/2019 1445   K 4.4 05/11/2019 1445   CL 98 05/11/2019 1445   CO2 27 05/11/2019 1445   GLUCOSE 91 05/11/2019 1445   BUN 15 05/11/2019 1445   CREATININE 0.82 05/11/2019 1445   CALCIUM 9.3 05/11/2019 1445   GFRNONAA >60 03/27/2016 1146   GFRAA >60 03/27/2016 1146    BNP No results found for: BNP  ProBNP No results found for: PROBNP  Specialty Problems      Pulmonary Problems   Allergic rhinitis   COPD with chronic bronchitis and emphysema ([place])    09/25/17 -FeNO-7      Sinusitis   Upper respiratory infection   Deviated nasal septum    ENT - [facility]          Allergies  Allergen Reactions  . Amoxicillin Nausea And Vomiting     Has patient had a PCN reaction causing immediate rash, facial/tongue/throat swelling, SOB or lightheadedness with hypotension: #  #  #  YES  #  #  #  Has patient had a PCN reaction causing severe rash involving mucus membranes or skin necrosis: No Has patient had a PCN reaction that required hospitalization No Has patient had a PCN reaction occurring within the last  10 years: #  #  #  YES  #  #  #  If all of the above answers are "NO", then may proceed with Cephalosporin use.  . Augmentin [Amoxicillin-Pot Clavulanate] Nausea And Vomiting  .  Demerol [Meperidine Hcl] Nausea And Vomiting  . Sulfa Antibiotics Nausea And Vomiting    Immunization History  Administered Date(s) Administered  . Influenza, High Dose Seasonal PF 12/07/2016, 11/19/2017, 12/05/2018  . PFIZER SARS-COV-2 Vaccination 03/19/2019, 04/13/2019  . Pneumococcal Polysaccharide-23 10/22/2017  . Tdap 11/01/2016  . Zoster Recombinat (Shingrix) 12/26/2016, 06/12/2017    Past Medical History:  Diagnosis Date  . Anemia    pernicious anemia - 1991   . Anxiety   . Arthritis    rheumatoid arthritis   . Asthma   . Chronic fatigue   . Cystitis, interstitial   . Depression   . Disc disorder of lumbar region 2013  . Fibromyalgia   . GERD (gastroesophageal reflux disease)   . History of kidney stones   . Hypothyroidism   . Lyme disease    hx of possible   . 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 Osteoporosis   . Pneumonia    hx of   . Rheumatoid arthritis ([place])     Tobacco History: Social History   Tobacco Use  Smoking Status Former Smoker  . Packs/day: 2.00  . Years: 20.00  . Pack years: 40.00  . Types: Cigarettes  . Quit date: 02/11/1990  . Years since quitting: 29.6  Smokeless Tobacco Never Used   Counseling given: Not Answered   Continue to not smoke  Outpatient Encounter Medications as of 09/29/2019  Medication Sig  . acetaminophen (TYLENOL) 500 MG tablet Take 1,000 mg by mouth every 6 (six) hours as needed for mild pain. For pain  . albuterol (VENTOLIN HFA) 108 (90 Base) MCG/ACT inhaler Inhale 2 puffs into the lungs every 6 (six) hours as needed for wheezing or shortness of breath. For shortness of breath  . ALPRAZolam (XANAX) 0.25 MG tablet Take 0.125 mg by mouth daily as needed for anxiety. Pt takes 1/2 tablet as needed   . calcium carbonate (OS-CAL - DOSED IN MG OF ELEMENTAL CALCIUM) 1250 MG tablet Take 1 tablet by mouth daily.  . famotidine (PEPCID) 40 MG tablet TAKE 1 TABLET(40 MG) BY  MOUTH EVERY EVENING  . FLUoxetine (PROZAC) 20 MG tablet Take 20 mg by mouth 2 (two) times daily.  . fluticasone (FLONASE) 50 MCG/ACT nasal spray Place 1 spray into both nostrils daily as needed for allergies or rhinitis.  Marland Kitchen levothyroxine (SYNTHROID, LEVOTHROID) 100 MCG tablet Take 50-100 mcg by mouth See admin instructions. TAKES 100 MCG DAILY EXCEPT ON THURSDAYS AND TAKES 50 MCG  . Melatonin 3 MG CAPS Take 3 mg by mouth at bedtime as needed (SLEEP).  . montelukast (SINGULAIR) 10 MG tablet Take 1 tablet (10 mg total) by mouth at bedtime.  . Naproxen Sodium (ALEVE) 220 MG CAPS Take by mouth.  Marland Kitchen omeprazole (PRILOSEC) 40 MG capsule TAKE 1 CAPSULE(40 MG) BY MOUTH IN THE MORNING  . pseudoephedrine (SUDAFED) 30 MG tablet Take 30 mg by mouth every 4 (four) hours as needed for congestion.  . rosuvastatin (CRESTOR) 10 MG tablet   . umeclidinium-vilanterol (ANORO ELLIPTA) 62.5-25 MCG/INH AEPB Inhale 1 puff into the lungs daily.  . Vitamin D, Ergocalciferol, (DRISDOL) 50000 UNITS CAPS Take 50,000 Units by mouth every 7 (seven) days. On Mondays  .  ANORO ELLIPTA 62.5-25 MCG/INH AEPB INHALE 1 PUFF INTO THE LUNGS DAILY (Patient not taking: Reported on 09/29/2019)  . omeprazole (PRILOSEC) 40 MG capsule TAKE 1 CAPSULE(40 MG) BY MOUTH IN THE MORNING (Patient not taking: Reported on 09/29/2019)  . predniSONE (DELTASONE) 20 MG tablet Take 1 tablet (20 mg total) by mouth daily with breakfast. (Patient not taking: Reported on 09/29/2019)   No facility-administered encounter medications on file as of 09/29/2019.     Review of Systems  Review of Systems  Constitutional: Positive for fatigue. Negative for activity change and fever.  HENT: Positive for congestion, postnasal drip, rhinorrhea and voice change (hoarseness). Negative for sinus pressure, sinus pain and sore throat.   Respiratory: Positive for cough (not productive ) and shortness of breath. Negative for wheezing.   Cardiovascular: Negative for chest pain and  palpitations.  Musculoskeletal: Negative for arthralgias.  Neurological: Negative for dizziness.  Psychiatric/Behavioral: Negative for sleep disturbance. The patient is not nervous/anxious.      Physical Exam  BP 118/68 (BP Location: Left Arm, Cuff Size: Normal)   Pulse 89   Temp (!) 97.4 F (36.3 C) (Oral)   Ht 5' (1.524 m)   Wt 104 lb (47.2 kg)   SpO2 99%   BMI 20.31 kg/m   Wt Readings from Last 5 Encounters:  09/29/19 104 lb (47.2 kg)  08/27/19 104 lb 9.6 oz (47.4 kg)  05/19/19 104 lb (47.2 kg)  05/18/19 105 lb (47.6 kg)  05/10/19 104 lb 6.4 oz (47.4 kg)    BMI Readings from Last 5 Encounters:  09/29/19 20.31 kg/m  08/27/19 21.13 kg/m  05/19/19 21.01 kg/m  05/18/19 21.21 kg/m  05/10/19 20.39 kg/m     Physical Exam Vitals and nursing note reviewed.  Constitutional:      General: She is not in acute distress.    Appearance: Normal appearance. She is normal weight.  HENT:     Head: Normocephalic and atraumatic.     Right Ear: Tympanic membrane, ear canal and external ear normal. There is no impacted cerumen.     Left Ear: Tympanic membrane, ear canal and external ear normal. There is no impacted cerumen.     Nose: Rhinorrhea present. No congestion.     Mouth/Throat:     Mouth: Mucous membranes are moist.     Pharynx: Oropharynx is clear.     Comments: PND  Eyes:     Pupils: Pupils are equal, round, and reactive to light.  Cardiovascular:     Rate and Rhythm: Normal rate and regular rhythm.     Pulses: Normal pulses.     Heart sounds: Normal heart sounds. No murmur heard.   Pulmonary:     Breath sounds: Normal breath sounds. No decreased air movement. No decreased breath sounds, wheezing or rales.  Musculoskeletal:     Cervical back: Normal range of motion.  Skin:    General: Skin is warm and dry.     Capillary Refill: Capillary refill takes less than 2 seconds.  Neurological:     General: No focal deficit present.     Mental Status: She is alert  and oriented to person, place, and time. Mental status is at baseline.     Gait: Gait normal.  Psychiatric:        Mood and Affect: Mood normal. Affect is angry.        Behavior: Behavior is agitated.        Thought Content: Thought content normal.  Judgment: Judgment normal.     Comments: Patient is quite frustrated that we cannot see ENT records.  She feels that this is found in our office should easily be able to access and that should have been coordinated.       Assessment & Plan:   Discussion: Complex case.I tried to explain to the best my knowledge that we are doing our best today in order to develop the best plan of care moving forward to take care of her multiple needs.  It is difficult for me to do this without being able to visually see the ENT records.  This was frustrating to the patient.  Patient is also frustrated regarding her previous ENT Dr. Erik Obey retiring.  I will work to try to improve consistency in her care within our clinic : she can have  a follow-up with Dr. Shearon Larson who she is established with next.  We will request records from ENT formally today.  I am unsure if there is additional pulmonary measures that we can take at this point in time to help with patient symptoms.  I reviewed with the patient multiple times today that if she becomes acutely ill we should obtain sputum samples.  She has been provided cups to keep on hand.  I have also emphasized that she needs to schedule a follow-up with ENT.  I will also update and communicate patient's care with Dr. Shearon Larson.  Allergic rhinitis Postnasal drip and rhinorrhea on physical exam today  Plan: Continue saline rinses, would recommend Nettie pot for larger lavage and improve nasal drainage I believe this may help thin out the persistent congestion the patient is currently dealing with Would recommend patient present back to ear nose and throat for a nasal/sinus culture at ENTs discretion Continue Flonase  COPD  with chronic bronchitis and emphysema ([facility]) No eosinophilia on lab work  Plan: Continue Anoro Ellipta Sputum cups provided today Patient to utilize sputum cups if she becomes acutely ill, explained to patient that she needs to be produce at least a quarter size of sputum in order for her to be an adequate sample 2 to 15-month follow-up with Dr. Shearon Larson  GERD (gastroesophageal reflux disease) Plan: Continue PPI Continue management as outlined by allergy asthma  Deviated nasal septum We will request records from ear nose and throat today Schedule a follow-up with ear nose and throat If you are requiring additional antibiotics due to suspected sinusitis I would recommend that they perform a culture to check for abnormal bacteria, fungal or atypical organisms within your sinus cavity     Return in about 2 months (around 11/29/2019), or if symptoms worsen or fail to improve, for Follow up with Dr. Shearon Larson.   Lauraine Rinne, Larson 09/29/2019   This appointment required 55 minutes of patient care (this includes precharting, chart review, review of results, face-to-face care, etc.).

## 2019-09-29 NOTE — Assessment & Plan Note (Signed)
Plan: Continue PPI Continue management as outlined by allergy asthma

## 2019-10-04 DIAGNOSIS — R0982 Postnasal drip: Secondary | ICD-10-CM | POA: Diagnosis not present

## 2019-10-04 DIAGNOSIS — J342 Deviated nasal septum: Secondary | ICD-10-CM | POA: Diagnosis not present

## 2019-10-04 DIAGNOSIS — J310 Chronic rhinitis: Secondary | ICD-10-CM | POA: Diagnosis not present

## 2019-10-07 DIAGNOSIS — Z20828 Contact with and (suspected) exposure to other viral communicable diseases: Secondary | ICD-10-CM | POA: Diagnosis not present

## 2019-10-12 ENCOUNTER — Ambulatory Visit: Payer: Medicare Other | Attending: Internal Medicine

## 2019-10-12 DIAGNOSIS — Z23 Encounter for immunization: Secondary | ICD-10-CM

## 2019-10-12 NOTE — Progress Notes (Signed)
   Covid-19 Vaccination Clinic  Name:  Pamela Larson    MRN: [id number] DOB: 1945/04/17  10/12/2019  [name] was observed post Covid-19 immunization for 15 minutes without incident. She was provided with Vaccine Information Sheet and instruction to access the V-Safe system.   [name] was instructed to call 911 with any severe reactions post vaccine: Marland Kitchen Difficulty breathing  . Swelling of face and throat  . A fast heartbeat  . A bad rash all over body  . Dizziness and weakness

## 2019-10-13 ENCOUNTER — Ambulatory Visit: Payer: Medicare Other | Admitting: Acute Care

## 2019-10-13 ENCOUNTER — Ambulatory Visit (INDEPENDENT_AMBULATORY_CARE_PROVIDER_SITE_OTHER): Payer: Medicare Other | Admitting: Acute Care

## 2019-10-13 ENCOUNTER — Other Ambulatory Visit: Payer: Self-pay

## 2019-10-13 ENCOUNTER — Encounter: Payer: Self-pay | Admitting: Acute Care

## 2019-10-13 VITALS — BP 120/70 | HR 63 | Temp 97.3°F | Ht 63.0 in | Wt 104.7 lb

## 2019-10-13 DIAGNOSIS — K2100 Gastro-esophageal reflux disease with esophagitis, without bleeding: Secondary | ICD-10-CM

## 2019-10-13 DIAGNOSIS — Z87891 Personal history of nicotine dependence: Secondary | ICD-10-CM | POA: Diagnosis not present

## 2019-10-13 DIAGNOSIS — J410 Simple chronic bronchitis: Secondary | ICD-10-CM | POA: Diagnosis not present

## 2019-10-13 DIAGNOSIS — J342 Deviated nasal septum: Secondary | ICD-10-CM

## 2019-10-13 DIAGNOSIS — J301 Allergic rhinitis due to pollen: Secondary | ICD-10-CM

## 2019-10-13 NOTE — Progress Notes (Signed)
History of Present Illness Pamela Larson is a 74 year old female former smoker followed in our office for recurrent bronchitis, GERD, Allergic Rhinitis.  Patient is also followed by Dr. Neldon Mc for allergy.  PMH: Anxiety, GERD, chronic fatigue syndrome, fibromyalgia, Lyme disease Smoker/ Smoking History: Former smoker.  40 pack years. Maintenance: Anoro Ellipta Pt of: Dr. Shearon Stalls   10/13/2019 Pt. Presents for follow up. She had been referred to both ID and ENT for chronic bronchitis, and has been referred back to the office for follow up.She states she has had a very hard 2 months.  She states she has been doing well. Last flare was 08/16/2019. She went to see her PCP Dr. Brigitte Pulse. CXR was clear. They did give her a z pack which she did not take, as she felt she did not need it. She saw Dr. Benjamine Mola  07/09/2019, she was told she had a deviated septum. He did prescribe gabapentin, but it made her so sleepy she did not continue taking it. She was treated with Clindamycin by Dr. Benjamine Mola x 2, and prednisone x 4 days per Rexene Edison NP. She did experience some chest pain and shortness of breath.She did have a follow up with Wyn Quaker 09/29/2019.She was then seen by [facility] and given a prednisone taper. She did not take this. She states she did not feel like she needed it.   She has been compliant with her Anoro. She feels her COPD has been stable. She feels she has had a stable interval.She did have some shortness of breath, but she feels this is getting better. She has been using her rescue inhaler 1-2 times a day as needed.   Test Results: CXR 06/22/2019 Stable mild hyperinflation and central peribronchial thickening indicating COPD and/or asthma. No acute cardiopulmonary disease   She has had her Covid Booster 10/12/2019.Veterinary surgeon)  CBC Latest Ref Rng & Units 05/11/2019 05/11/2019 10/07/2017  WBC 4.0 - 10.5 K/uL 4.1 - 7.9  Hemoglobin 12.0 - 15.0 g/dL 12.4 - 14.8  Hematocrit 34.0 - 46.6 % 36.5  36.2 43.1  Platelets 150 - 400 K/uL 278.0 - 326    BMP Latest Ref Rng & Units 05/11/2019 03/27/2016 11/06/2014  Glucose 70 - 99 mg/dL 91 86 87  BUN 6 - 23 mg/dL 15 10 10   Creatinine 0.40 - 1.20 mg/dL 0.82 0.65 0.77  Sodium 135 - 145 mEq/L 134(L) 139 139  Potassium 3.5 - 5.1 mEq/L 4.4 4.3 4.1  Chloride 96 - 112 mEq/L 98 101 99(L)  CO2 19 - 32 mEq/L 27 25 27   Calcium 8.4 - 10.5 mg/dL 9.3 9.8 9.7    BNP No results found for: BNP  ProBNP No results found for: PROBNP  PFT    Component Value Date/Time   FEV1PRE 2.08 10/16/2017 1355   FEV1POST 2.10 10/16/2017 1355   FVCPRE 2.60 10/16/2017 1355   FVCPOST 2.55 10/16/2017 1355   TLC 4.43 10/16/2017 1355   DLCOUNC 15.92 10/16/2017 1355   PREFEV1FVCRT 80 10/16/2017 1355   PSTFEV1FVCRT 82 10/16/2017 1355    No results found.   Past medical hx Past Medical History:  Diagnosis Date  . Anemia    pernicious anemia - 1991   . Anxiety   . Arthritis    rheumatoid arthritis   . Asthma   . Chronic fatigue   . Cystitis, interstitial   . Depression   . Disc disorder of lumbar region 2013  . Fibromyalgia   . GERD (gastroesophageal reflux disease)   .  History of kidney stones   . Hypothyroidism   . Lyme disease    hx of possible   . Neuromuscular disorder (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 Osteoporosis   . Pneumonia    hx of   . Rheumatoid arthritis ([place])      Social History   Tobacco Use  . Smoking status: Former Smoker    Packs/day: 2.00    Years: 20.00    Pack years: 40.00    Types: Cigarettes    Quit date: 02/11/1990    Years since quitting: 29.6  . Smokeless tobacco: Never Used  Vaping Use  . Vaping Use: Never used  Substance Use Topics  . Alcohol use: Yes    Alcohol/week: 14.0 standard drinks    Types: 14 Standard drinks or equivalent per week    Comment: drink per nite scotch  . Drug use: No    [name] reports that she quit smoking about 29 years ago. Her smoking use  included cigarettes. She has a 40.00 pack-year smoking history. She has never used smokeless tobacco. She reports current alcohol use of about 14.0 standard drinks of alcohol per week. She reports that she does not use drugs.  Tobacco Cessation: 40 pack year smoking history  Past surgical hx, Family hx, Social hx all reviewed.  Current Outpatient Medications on File Prior to Visit  Medication Sig  . acetaminophen (TYLENOL) 500 MG tablet Take 1,000 mg by mouth every 6 (six) hours as needed for mild pain. For pain  . albuterol (VENTOLIN HFA) 108 (90 Base) MCG/ACT inhaler Inhale 2 puffs into the lungs every 6 (six) hours as needed for wheezing or shortness of breath. For shortness of breath  . ALPRAZolam (XANAX) 0.25 MG tablet Take 0.125 mg by mouth daily as needed for anxiety. Pt takes 1/2 tablet as needed   . ANORO ELLIPTA 62.5-25 MCG/INH AEPB INHALE 1 PUFF INTO THE LUNGS DAILY  . calcium carbonate (OS-CAL - DOSED IN MG OF ELEMENTAL CALCIUM) 1250 MG tablet Take 1 tablet by mouth daily.  . famotidine (PEPCID) 40 MG tablet TAKE 1 TABLET(40 MG) BY MOUTH EVERY EVENING  . FLUoxetine (PROZAC) 20 MG tablet Take 20 mg by mouth 2 (two) times daily.  . fluticasone (FLONASE) 50 MCG/ACT nasal spray Place 1 spray into both nostrils daily as needed for allergies or rhinitis.  Marland Kitchen levothyroxine (SYNTHROID, LEVOTHROID) 100 MCG tablet Take 50-100 mcg by mouth See admin instructions. TAKES 100 MCG DAILY EXCEPT ON THURSDAYS AND TAKES 50 MCG  . Melatonin 3 MG CAPS Take 3 mg by mouth at bedtime as needed (SLEEP).  . montelukast (SINGULAIR) 10 MG tablet Take 1 tablet (10 mg total) by mouth at bedtime.  . Naproxen Sodium (ALEVE) 220 MG CAPS Take by mouth.  Marland Kitchen omeprazole (PRILOSEC) 40 MG capsule TAKE 1 CAPSULE(40 MG) BY MOUTH IN THE MORNING  . omeprazole (PRILOSEC) 40 MG capsule TAKE 1 CAPSULE(40 MG) BY MOUTH IN THE MORNING  . predniSONE (DELTASONE) 20 MG tablet Take 1 tablet (20 mg total) by mouth daily with breakfast.    . pseudoephedrine (SUDAFED) 30 MG tablet Take 30 mg by mouth every 4 (four) hours as needed for congestion.  . rosuvastatin (CRESTOR) 10 MG tablet   . umeclidinium-vilanterol (ANORO ELLIPTA) 62.5-25 MCG/INH AEPB Inhale 1 puff into the lungs daily.  . Vitamin D, Ergocalciferol, (DRISDOL) 50000 UNITS CAPS Take 50,000 Units by mouth every 7 (seven) days. On Mondays  No current facility-administered medications on file prior to visit.     Allergies  Allergen Reactions  . Amoxicillin Nausea And Vomiting     Has patient had a PCN reaction causing immediate rash, facial/tongue/throat swelling, SOB or lightheadedness with hypotension: #  #  #  YES  #  #  #  Has patient had a PCN reaction causing severe rash involving mucus membranes or skin necrosis: No Has patient had a PCN reaction that required hospitalization No Has patient had a PCN reaction occurring within the last 10 years: #  #  #  YES  #  #  #  If all of the above answers are "NO", then may proceed with Cephalosporin use.  . Augmentin [Amoxicillin-Pot Clavulanate] Nausea And Vomiting  . Demerol [Meperidine Hcl] Nausea And Vomiting  . Sulfa Antibiotics Nausea And Vomiting    Review Of Systems:  Constitutional:   No  weight loss, night sweats,  Fevers, chills, fatigue, or  lassitude.  HEENT:   No headaches,  Difficulty swallowing,  Tooth/dental problems, or  Sore throat,                No sneezing, itching, ear ache, nasal congestion, post nasal drip,   CV:  No chest pain,  Orthopnea, PND, swelling in lower extremities, anasarca, dizziness, palpitations, syncope.   GI  No heartburn, indigestion, abdominal pain, nausea, vomiting, diarrhea, change in bowel habits, loss of appetite, bloody stools.   Resp:  + shortness of breath with exertion none  at rest.  No excess mucus, no productive cough,  No non-productive cough,  No coughing up of blood.  No change in color of mucus.  No wheezing.  No chest wall deformity  Skin: no rash or  lesions.  GU: no dysuria, change in color of urine, no urgency or frequency.  No flank pain, no hematuria   MS:  No joint pain or swelling.  No decreased range of motion.  No back pain.  Psych:  No change in mood or affect. No depression or anxiety.  No memory loss.   Vital Signs BP 120/70 (BP Location: Left Arm, Cuff Size: Normal)   Pulse 63   Temp (!) 97.3 F (36.3 C) (Oral)   Ht 5\' 3"  (1.6 m)   Wt 104 lb 11.2 oz (47.5 kg)   SpO2 97%   BMI 18.55 kg/m    Physical Exam:  General- No distress,  A&Ox3, pleasant ENT: No sinus tenderness, TM clear, pale nasal mucosa, no oral exudate,no post nasal drip, no LAN Cardiac: S1, S2, regular rate and rhythm, no murmur Chest: No wheeze/ rales/ dullness; no accessory muscle use, no nasal flaring, no sternal retractions Abd.: Soft Non-tender, ND, BS+, Body mass index is 18.55 kg/m. Ext: No clubbing cyanosis, edema Neuro:  normal strength, MAE x 4, A&O x 3 Skin: No rashes, No lesions, warm and dry Psych: normal mood and behavior   Assessment/Plan  Recent Bronchitis Flare>> resolved Treated with Clindamycin x 2 and pred taper Plan Continue aggressive rhinitis therapy Continue nasal Saline rinses  and Flonase daily as you have been doing  Continue Mucinex and Sudafed as you have been doing Continue Singulair as you are doing Follow up with Dr. Shearon Stalls or Judson Roch NP in 3 months, or before as needed.  Please contact office for sooner follow up if symptoms do not improve or worsen or seek emergency care  Let us know if you need [place] sooner.   History of Tobacco Abuse Plan  Counseling done Continue Anoro 1 puff once daily every day Rinse mouth after use.  Reflux Plan Avoid Caffeine and Alcohol consumption before bed   Deviated Septum Plan Continue follow up as needed with Dr. Benjamine Mola Continue Omeprazole 40 mg QD in am  Continue Famotidine 40 mg every pm   [name], NP 10/13/2019  5:43 PM

## 2019-10-13 NOTE — Patient Instructions (Addendum)
It is good to see you today.  Continue your Anoro one puff once daily Continue nasal Saline and Flonase daily as you have been doing  Continue Mucinex and Sudafed as you have been doing Follow up with Dr. Shearon Stalls or Judson Roch NP in 3 months, or before as needed.  Please contact office for sooner follow up if symptoms do not improve or worsen or seek emergency care

## 2019-11-09 ENCOUNTER — Other Ambulatory Visit: Payer: Self-pay | Admitting: Allergy and Immunology

## 2019-11-23 ENCOUNTER — Encounter: Payer: Self-pay | Admitting: Allergy and Immunology

## 2019-11-23 ENCOUNTER — Ambulatory Visit (INDEPENDENT_AMBULATORY_CARE_PROVIDER_SITE_OTHER): Payer: Medicare Other | Admitting: Allergy and Immunology

## 2019-11-23 ENCOUNTER — Other Ambulatory Visit: Payer: Self-pay

## 2019-11-23 VITALS — BP 136/86 | HR 89 | Temp 97.3°F | Resp 16 | Ht 59.0 in | Wt 106.2 lb

## 2019-11-23 DIAGNOSIS — J4540 Moderate persistent asthma, uncomplicated: Secondary | ICD-10-CM | POA: Diagnosis not present

## 2019-11-23 DIAGNOSIS — G500 Trigeminal neuralgia: Secondary | ICD-10-CM

## 2019-11-23 DIAGNOSIS — J3089 Other allergic rhinitis: Secondary | ICD-10-CM

## 2019-11-23 DIAGNOSIS — K219 Gastro-esophageal reflux disease without esophagitis: Secondary | ICD-10-CM | POA: Diagnosis not present

## 2019-11-23 DIAGNOSIS — J439 Emphysema, unspecified: Secondary | ICD-10-CM | POA: Diagnosis not present

## 2019-11-23 MED ORDER — FAMOTIDINE 40 MG PO TABS: ORAL_TABLET | ORAL | 5 refills | Status: DC

## 2019-11-23 MED ORDER — OMEPRAZOLE 40 MG PO CPDR: DELAYED_RELEASE_CAPSULE | ORAL | 5 refills | Status: DC

## 2019-11-23 MED ORDER — ANORO ELLIPTA 62.5-25 MCG/INH IN AEPB: INHALATION_SPRAY | RESPIRATORY_TRACT | 5 refills | Status: DC

## 2019-11-23 MED ORDER — MONTELUKAST SODIUM 10 MG PO TABS: ORAL_TABLET | ORAL | 5 refills | Status: DC

## 2019-11-23 NOTE — Progress Notes (Signed)
[facility]   Follow-up Note  Referring Provider: Marton Redwood, MD Primary Provider: Marton Redwood, MD Date of Office Visit: 11/23/2019  Subjective:   Ronella Plunk Sklar (DOB: 27-Feb-1945) is a 74 y.o. female who returns to the [facility] on 11/23/2019 in re-evaluation of the following:  HPI: Yelena returns to this clinic in reevaluation of LPR, emphysema with possible component of asthma, chronic rhinitis, and history of left-sided facial pain syndrome.  I last saw her in this clinic on 18 May 2019.  Her airway has done wonderful during this interval other than the fact that she contracted a viral respiratory tract infection from her grandson on 15 August 2019 and this was a prolonged event that went on for about 6 to 8 weeks involving both her upper and lower airway requiring the administration of clindamycin, azithromycin, and systemic steroids.  Fortunately, she finally resolved this issue and at this point her respiratory tract feels fine.  She rarely uses a short acting bronchodilator and she can exercise without any problem.  Her nose is doing quite well.  She has not been having any chronic left facial pain syndrome recently.  Her reflux and her throat are doing very well on her current treatment.  She has received 3 Pfizer Covid vaccinations.  Allergies as of 11/23/2019      Reactions   Amoxicillin Nausea And Vomiting   Has patient had a PCN reaction causing immediate rash, facial/tongue/throat swelling, SOB or lightheadedness with hypotension: #  #  #  YES  #  #  #  Has patient had a PCN reaction causing severe rash involving mucus membranes or skin necrosis: No Has patient had a PCN reaction that required hospitalization No Has patient had a PCN reaction occurring within the last 10 years: #  #  #  YES  #  #  #  If all of the above answers are "NO", then may proceed with Cephalosporin use.   Augmentin  [amoxicillin-pot Clavulanate] Nausea And Vomiting   Demerol [meperidine Hcl] Nausea And Vomiting   Sulfa Antibiotics Nausea And Vomiting      Medication List      acetaminophen 500 MG tablet Commonly known as: TYLENOL Take 1,000 mg by mouth every 6 (six) hours as needed for mild pain. For pain   albuterol 108 (90 Base) MCG/ACT inhaler Commonly known as: VENTOLIN HFA Inhale 2 puffs into the lungs every 6 (six) hours as needed for wheezing or shortness of breath. For shortness of breath   Aleve 220 MG Caps Generic drug: Naproxen Sodium Take by mouth.   ALPRAZolam 0.25 MG tablet Commonly known as: XANAX Take 0.125 mg by mouth daily as needed for anxiety. Pt takes 1/2 tablet as needed   Anoro Ellipta 62.5-25 MCG/INH Aepb Generic drug: umeclidinium-vilanterol INHALE 1 PUFF INTO THE LUNGS DAILY   Anoro Ellipta 62.5-25 MCG/INH Aepb Generic drug: umeclidinium-vilanterol Inhale 1 puff into the lungs daily.   calcium carbonate 1250 (500 Ca) MG tablet Commonly known as: OS-CAL - dosed in mg of elemental calcium Take 1 tablet by mouth daily.   famotidine 40 MG tablet Commonly known as: PEPCID TAKE 1 TABLET(40 MG) BY MOUTH EVERY EVENING   FLUoxetine 20 MG tablet Commonly known as: PROZAC Take 20 mg by mouth 2 (two) times daily.   fluticasone 50 MCG/ACT nasal spray Commonly known as: FLONASE Place 1 spray into both nostrils daily as needed for allergies or rhinitis.  levothyroxine 100 MCG tablet Commonly known as: SYNTHROID Take 50-100 mcg by mouth See admin instructions. TAKES 100 MCG DAILY EXCEPT ON THURSDAYS AND TAKES 50 MCG   Melatonin 3 MG Caps Take 3 mg by mouth at bedtime as needed (SLEEP).   montelukast 10 MG tablet Commonly known as: SINGULAIR TAKE 1 TABLET(10 MG) BY MOUTH AT BEDTIME   omeprazole 40 MG capsule Commonly known as: PRILOSEC TAKE 1 CAPSULE(40 MG) BY MOUTH IN THE MORNING   omeprazole 40 MG capsule Commonly known as: PRILOSEC TAKE 1 CAPSULE(40  MG) BY MOUTH IN THE MORNING   predniSONE 20 MG tablet Commonly known as: DELTASONE Take 1 tablet (20 mg total) by mouth daily with breakfast.   rosuvastatin 10 MG tablet Commonly known as: CRESTOR   Sudafed 30 MG tablet Generic drug: pseudoephedrine Take 30 mg by mouth every 4 (four) hours as needed for congestion.   Vitamin D (Ergocalciferol) 1.25 MG (50000 UNIT) Caps capsule Commonly known as: DRISDOL Take 50,000 Units by mouth every 7 (seven) days. On Mondays       Past Medical History:  Diagnosis Date  . Anemia    pernicious anemia - 1991   . Anxiety   . Arthritis    rheumatoid arthritis   . Asthma   . Chronic fatigue   . Cystitis, interstitial   . Depression   . Disc disorder of lumbar region 2013  . Fibromyalgia   . GERD (gastroesophageal reflux disease)   . History of kidney stones   . Hypothyroidism   . Lyme disease    hx of possible   . 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 Osteoporosis   . Pneumonia    hx of   . Rheumatoid arthritis [facility])     Past Surgical History:  Procedure Laterality Date  . APPENDECTOMY  1978  . bladder hydrodistention  1998  . BREAST SURGERY     reduction   . CERVICAL DISC ARTHROPLASTY N/A 04/03/2016   Procedure: CERVICAL SIX CERVICAL SEVEN [place] ARTHROPLASTY;  Surgeon: Jovita Gamma, MD;  Location: [place];  Service: Neurosurgery;  Laterality: N/A;  left side approach  . COSMETIC SURGERY  2005   face lift  . EXCISION MORTON'S NEUROMA Left 05/08/2017   Procedure: EXCISION MORTON'S NEUROMA Left 3rd webspace;  Surgeon: [name], MD;  Location: [place];  Service: Orthopedics;  Laterality: Left;  . EYE SURGERY     lower eye lids  . LUMBAR LAMINECTOMY/DECOMPRESSION MICRODISCECTOMY  06/10/2011   Procedure: LUMBAR LAMINECTOMY/DECOMPRESSION MICRODISCECTOMY 1 LEVEL;  Surgeon: Hosie Spangle, MD;  Location[place] [facility]UR[facility];  Service: Neurosurgery;  Laterality: Left;   Lumbar Five Sacral One Lumbar Laminectomy Decompression Microdiscectomy     Review of systems negative except as noted in HPI / PMHx or noted below:  Review of Systems  Constitutional: Negative.   HENT: Negative.   Eyes: Negative.   Respiratory: Negative.   Cardiovascular: Negative.   Gastrointestinal: Negative.   Genitourinary: Negative.   Musculoskeletal: Negative.   Skin: Negative.   Neurological: Negative.   Endo/Heme/Allergies: Negative.   Psychiatric/Behavioral: Negative.      Objective:   Vitals:   11/23/19 1623  BP: 136/86  Pulse: 89  Resp: 16  Temp: (!) 97.3 F (36.3 C)  SpO2: 95%   Height: 4\' 11"  (149.9 cm)  Weight: 106 lb 3.2 oz (48.2 kg)   Physical Exam Constitutional:      Appearance:  She is not diaphoretic.  HENT:     Head: Normocephalic.     Right Ear: Tympanic membrane, ear canal and external ear normal.     Left Ear: Tympanic membrane, ear canal and external ear normal.     Nose: Nose normal. No mucosal edema or rhinorrhea.     Mouth/Throat:     Pharynx: Uvula midline. No oropharyngeal exudate.  Eyes:     Conjunctiva/sclera: Conjunctivae normal.  Neck:     Thyroid: No thyromegaly.     Trachea: Trachea normal. No tracheal tenderness or tracheal deviation.  Cardiovascular:     Rate and Rhythm: Normal rate and regular rhythm.     Heart sounds: Normal heart sounds, S1 normal and S2 normal. No murmur heard.   Pulmonary:     Effort: No respiratory distress.     Breath sounds: Normal breath sounds. No stridor. No wheezing or rales.  Lymphadenopathy:     Head:     Right side of head: No tonsillar adenopathy.     Left side of head: No tonsillar adenopathy.     Cervical: No cervical adenopathy.  Skin:    Findings: No erythema or rash.     Nails: There is no clubbing.  Neurological:     Mental Status: She is alert.     Diagnostics:    Spirometry was performed and demonstrated an FEV1 of 1.60 at 118 % of predicted.   Assessment and Plan:    1. Pulmonary emphysema, unspecified emphysema type ([place])   2. Asthma, moderate persistent, well-controlled   3. Perennial allergic rhinitis   4. LPRD (laryngopharyngeal reflux disease)   5. Facial pain syndrome     1.  Continue to Treat and prevent inflammation:   A.  Flonase 1-2 sprays each nostril 1 time per day  B.  montelukast 10 mg tablet 1 time per day  C.  Anoro - 1 inhalation 1 time per day  2.  Continue to Treat and prevent reflux:   A.  Continue to consolidate caffeine and alcohol consumption  B.  Omeprazole 40 mg tablet in a.m.  C.  Famotidine 40 mg mg tablet in p.m.  3.  If needed:   A.  OTC antihistamine  B.  Nasal saline  C.  Albuterol HFA - 2 inhalations every 4-6 hours  4. Return to clinic in 6 months or earlier if problem.    5. Obtain flu vaccine  Araeya appears to be doing quite well on her current plan other than a viral respiratory tract infection that gave rise to significant inflammation of both her upper and lower airway the mid summer.  Assuming she does well with this plan noted above directed against both respiratory tract inflammation and reflux we will just see her back in his clinic in 6 months or earlier if there is a problem.  Allena Katz, MD Allergy / Immunology [place]

## 2019-11-23 NOTE — Patient Instructions (Signed)
  1.  Continue to Treat and prevent inflammation:   A.  Flonase 1-2 sprays each nostril 1 time per day  B.  montelukast 10 mg tablet 1 time per day  C.  Anoro - 1 inhalation 1 time per day  2.  Continue to Treat and prevent reflux:   A.  Continue to consolidate caffeine and alcohol consumption  B.  Omeprazole 40 mg tablet in a.m.  C.  Famotidine 40 mg mg tablet in p.m.  3.  If needed:   A.  OTC antihistamine  B.  Nasal saline  C.  Albuterol HFA - 2 inhalations every 4-6 hours  4. Return to clinic in 6 months or earlier if problem.    5. Obtain flu vaccine

## 2019-11-29 ENCOUNTER — Encounter: Payer: Self-pay | Admitting: Allergy and Immunology

## 2019-12-01 DIAGNOSIS — Z23 Encounter for immunization: Secondary | ICD-10-CM | POA: Diagnosis not present

## 2019-12-16 DIAGNOSIS — D510 Vitamin B12 deficiency anemia due to intrinsic factor deficiency: Secondary | ICD-10-CM | POA: Diagnosis not present

## 2019-12-21 DIAGNOSIS — Z1231 Encounter for screening mammogram for malignant neoplasm of breast: Secondary | ICD-10-CM | POA: Diagnosis not present

## 2020-01-11 ENCOUNTER — Telehealth: Payer: Self-pay | Admitting: Internal Medicine

## 2020-01-11 NOTE — Telephone Encounter (Signed)
Pt is requesting a call back from a nurse, missed call 

## 2020-01-11 NOTE — Telephone Encounter (Signed)
Line busy

## 2020-01-11 NOTE — Telephone Encounter (Signed)
GI ON CALL  Constipation since taking pain meds for shoulder injury a few days ago. No results with glycerin suppository. No vomiting or pain. Up to date on colonoscopy. Recommended Miralax--titrate to need

## 2020-01-13 DIAGNOSIS — M25512 Pain in left shoulder: Secondary | ICD-10-CM | POA: Diagnosis not present

## 2020-01-13 DIAGNOSIS — M546 Pain in thoracic spine: Secondary | ICD-10-CM | POA: Diagnosis not present

## 2020-01-19 DIAGNOSIS — M810 Age-related osteoporosis without current pathological fracture: Secondary | ICD-10-CM | POA: Diagnosis not present

## 2020-01-19 DIAGNOSIS — D510 Vitamin B12 deficiency anemia due to intrinsic factor deficiency: Secondary | ICD-10-CM | POA: Diagnosis not present

## 2020-01-19 DIAGNOSIS — E785 Hyperlipidemia, unspecified: Secondary | ICD-10-CM | POA: Diagnosis not present

## 2020-01-19 DIAGNOSIS — M25512 Pain in left shoulder: Secondary | ICD-10-CM | POA: Diagnosis not present

## 2020-01-19 DIAGNOSIS — E538 Deficiency of other specified B group vitamins: Secondary | ICD-10-CM | POA: Diagnosis not present

## 2020-01-19 DIAGNOSIS — E039 Hypothyroidism, unspecified: Secondary | ICD-10-CM | POA: Diagnosis not present

## 2020-01-24 DIAGNOSIS — D472 Monoclonal gammopathy: Secondary | ICD-10-CM | POA: Diagnosis not present

## 2020-01-24 DIAGNOSIS — Z0000 Encounter for general adult medical examination without abnormal findings: Secondary | ICD-10-CM | POA: Diagnosis not present

## 2020-01-24 DIAGNOSIS — D692 Other nonthrombocytopenic purpura: Secondary | ICD-10-CM | POA: Diagnosis not present

## 2020-01-24 DIAGNOSIS — D510 Vitamin B12 deficiency anemia due to intrinsic factor deficiency: Secondary | ICD-10-CM | POA: Diagnosis not present

## 2020-01-24 DIAGNOSIS — M0600 Rheumatoid arthritis without rheumatoid factor, unspecified site: Secondary | ICD-10-CM | POA: Diagnosis not present

## 2020-01-24 DIAGNOSIS — E785 Hyperlipidemia, unspecified: Secondary | ICD-10-CM | POA: Diagnosis not present

## 2020-01-24 DIAGNOSIS — Z1339 Encounter for screening examination for other mental health and behavioral disorders: Secondary | ICD-10-CM | POA: Diagnosis not present

## 2020-01-24 DIAGNOSIS — E039 Hypothyroidism, unspecified: Secondary | ICD-10-CM | POA: Diagnosis not present

## 2020-01-24 DIAGNOSIS — M810 Age-related osteoporosis without current pathological fracture: Secondary | ICD-10-CM | POA: Diagnosis not present

## 2020-01-24 DIAGNOSIS — I2510 Atherosclerotic heart disease of native coronary artery without angina pectoris: Secondary | ICD-10-CM | POA: Diagnosis not present

## 2020-01-24 DIAGNOSIS — J449 Chronic obstructive pulmonary disease, unspecified: Secondary | ICD-10-CM | POA: Diagnosis not present

## 2020-01-24 DIAGNOSIS — I700 Atherosclerosis of aorta: Secondary | ICD-10-CM | POA: Diagnosis not present

## 2020-01-24 DIAGNOSIS — Z1331 Encounter for screening for depression: Secondary | ICD-10-CM | POA: Diagnosis not present

## 2020-01-24 DIAGNOSIS — R5382 Chronic fatigue, unspecified: Secondary | ICD-10-CM | POA: Diagnosis not present

## 2020-02-09 ENCOUNTER — Other Ambulatory Visit (HOSPITAL_COMMUNITY): Payer: Self-pay

## 2020-02-10 ENCOUNTER — Inpatient Hospital Stay (HOSPITAL_COMMUNITY): Admission: RE | Admit: 2020-02-10 | Payer: Medicare Other | Source: Ambulatory Visit

## 2020-03-15 DIAGNOSIS — D472 Monoclonal gammopathy: Secondary | ICD-10-CM | POA: Diagnosis not present

## 2020-03-15 DIAGNOSIS — D510 Vitamin B12 deficiency anemia due to intrinsic factor deficiency: Secondary | ICD-10-CM | POA: Diagnosis not present

## 2020-03-20 ENCOUNTER — Telehealth: Payer: Self-pay | Admitting: Acute Care

## 2020-03-20 DIAGNOSIS — Z1159 Encounter for screening for other viral diseases: Secondary | ICD-10-CM | POA: Diagnosis not present

## 2020-03-20 NOTE — Telephone Encounter (Signed)
Spoke with patient. She is aware of Sarah's response. She stated that she was told that she would have her results by this afternoon or tomorrow morning. She will call us once she is aware.   Will leave this encounter open for her return call.

## 2020-03-20 NOTE — Telephone Encounter (Signed)
Have her call us the results of her Covid test and we can use that to help guide need to treat. Thanks

## 2020-03-20 NOTE — Telephone Encounter (Signed)
Called and spoke with patient who states that she feels like she might have bronchitis. States that her chest feels tight and feels like its rattling, dry cough and nasal drainage. Covid test done today at 11 am waiting on results.  Sarah please advise

## 2020-03-20 NOTE — Telephone Encounter (Signed)
Spoke with patient to ask her to keep [place] updated on her test results so that way we know the best way to treat her. She expressed understanding. States that she has COPD and emphysema and is just trying to get ahead of her symptoms. Advised her that we would know what to do for her once we got her results back. She said she would keep [place] updated.

## 2020-03-20 NOTE — Telephone Encounter (Signed)
Patient states having symptoms of cough and chest tightness. Phone number is [phone].

## 2020-03-21 NOTE — Telephone Encounter (Signed)
No openings today there is possibly some with you tomorrow morning at 10 or 11:30 if you are ok with [place] using those slots. Looks like you are not here in the afternoon is that correct?

## 2020-03-21 NOTE — Telephone Encounter (Signed)
She needs a televisit.  Are there any opening slots at the office

## 2020-03-21 NOTE — Telephone Encounter (Signed)
SG please advise. Thanks   Covid test negative. Symptyms laryngitis. shallow coughing, since last tuesday. Please Advise

## 2020-03-21 NOTE — Telephone Encounter (Signed)
Called and spoke with patient and she is now scheduled for a televisit tomorrow with Sarah at 10 am. Will route to Judson Roch as Juluis Rainier. Nothing further needed at this time.

## 2020-03-21 NOTE — Progress Notes (Signed)
Virtual Visit via Telephone Note  I connected with Pamela Larson on 03/21/20 at 10:00 AM EST by telephone and verified that I am speaking with the correct person using two identifiers.  Location: Patient: At home Provider: [address], [place], [place], [address]   I discussed the limitations, risks, security and privacy concerns of performing an evaluation and management service by telephone and the availability of in person appointments. I also discussed with the patient that there may be a patient responsible charge related to this service. The patient expressed understanding and agreed to proceed.  Synopsis: Pamela Larson is a 75 year old female former smoker followed in our office for COPD and recurrent bronchitis, GERD, Allergic Rhinitis. Patient is also followed by Dr. Neldon Mc for allergy.  PMH: Anxiety, GERD, chronic fatigue syndrome, fibromyalgia, Lyme disease Smoker/ Smoking History: Former smoker. 40 pack years. Quit 1992 Maintenance: Anoro Ellipta Pt of:Dr. Shearon Stalls    History of Present Illness: Pt called the office 2/7 with complaints of  Bronchitis, and  chest tightness that started last Tuesday ( 2/1). She was advised to get Covid testing, which she did. She reports to [place] verbally that she was negative and would like to be treated for her bronchitis. Last antibiotic were 8.2021 ( Clindamycin for sinus infection) per ENT.  Pt. States she noticed her first symptoms last week. She had a sick contact last Sunday. She started noticing a cough with drainage the following Tuesday. By Thursday she was sick.  By Saturday she had laryngitis.. She states she has a  fever, no chest pain. She is wheezing , she does have chest tightness. She states she has more dyspnea with exertion than is her baseline. She states this is not a sinus issue. She is had a negative Covid test.She is using her Anoro daily. She does not use her rescue often as it makes her jittery. She is using  Mucinex BID, and pseudoped daily. This has kept her sinus issues well controlled. She states she has a yellow green thick mucus with cough. She sounds Hoarse over the phone. She has had Covid vaccine x 2 and Booster   Observations/Objective: 05/11/19 - CBC with diff - eos relative 1.3, eos absolute 0.1  05/21/2019-spirometry-FVC 2.54 (107% predicted), ratio 0.76, FEV1 1.93 (108% predicted)  06/15/2019-nasal endoscopy-ear nose and throat-left septal deviation is noted with mild rhinitis, no purulent drainage, polyps or suspicious mass or lesion is noted on nasal endoscopy,  06/23/2019-chest x-ray-stable mild hyperinflation and central peribronchial thickening, indicating COPD and/or asthma  07/03/2018-CT maxillofacial-negative exam  08/28/2017-CT chest high-res-no findings to suggest interstitial lung disease, mild diffuse bronchial wall thickening with very mild paraseptal emphysema  10/16/2017-pulmonary function test-FVC 2.6 (103% predicted), postbronchodilator ratio 82, postbronchodilator FEV1 2.10 (110% predicted), no bronchodilator response, DLCO 15.92 (84% predicted) 08/22/2017-respiratory sputum-growth of normal flora 08/22/2017-AFB-no Mycobacterium species identified 08/22/2017-fungal-no fungal elements seen    Assessment and Plan: Bronchitis Flare Green secretions from chest Fever/ Wheezing and cough Last Antibiotics 09/2019 Plan We will send in a z pack Take until gone Use probiotic while on antibiotic, or eat Activia Yogurt We will send in a prednisone taper Prednisone taper; 10 mg tablets: 4 tabs x 2 days, 3 tabs x 2 days, 2 tabs x 2 days 1 tab x 2 days then stop. Take with food.  Robitussin and Delsym for cough as needed Take as directed. Sips of water instead of throat clearing.  Continue Mucinex as you have been doing. Take with plenty of water.  Flutter valve if you have one 3 times daily Continue your Anoro daily as you have been doing Rinse mouth after  use. Continue your Rescue inhaler as needed Continue your allergy regimen per Dr. Carmelina Peal. Please call if you do not get better and we will see you in the office for follow up Please contact office for sooner follow up if symptoms do not improve or worsen or seek emergency care .  History of Tobacco Abuse COPD Plan Continue Anoro 1 puff once daily every day Rinse mouth after use.  Reflux Plan Avoid Caffeine and Alcohol consumption before be Omeprazole 40 mg tablet in a.m.  Famotidine 40 mg mg tablet in p.m.    Follow Up Instructions:  Call the office if you do not get better after treatment and we will arrange for an in office visit.    I discussed the assessment and treatment plan with the patient. The patient was provided an opportunity to ask questions and all were answered. The patient agreed with the plan and demonstrated an understanding of the instructions.   The patient was advised to call back or seek an in-person evaluation if the symptoms worsen or if the condition fails to improve as anticipated.  I provided  35 minutes of non-face-to-face time during this encounter.   [name], NP  03/22/2020

## 2020-03-21 NOTE — Telephone Encounter (Signed)
Put her in the 10 am slot.If she can't do that then she will need to have a tele visit with another provider tomorrow when she is available.  I have a funeral tomorrow afternoon, so I will not be there.  Thanks

## 2020-03-22 ENCOUNTER — Encounter: Payer: Self-pay | Admitting: Acute Care

## 2020-03-22 ENCOUNTER — Ambulatory Visit (INDEPENDENT_AMBULATORY_CARE_PROVIDER_SITE_OTHER): Payer: Medicare Other | Admitting: Acute Care

## 2020-03-22 ENCOUNTER — Other Ambulatory Visit: Payer: Self-pay

## 2020-03-22 DIAGNOSIS — J449 Chronic obstructive pulmonary disease, unspecified: Secondary | ICD-10-CM | POA: Diagnosis not present

## 2020-03-22 DIAGNOSIS — Z87891 Personal history of nicotine dependence: Secondary | ICD-10-CM

## 2020-03-22 DIAGNOSIS — K219 Gastro-esophageal reflux disease without esophagitis: Secondary | ICD-10-CM

## 2020-03-22 MED ORDER — AZITHROMYCIN 250 MG PO TABS: 250.0000 mg | ORAL_TABLET | Freq: Every day | ORAL | 0 refills | Status: DC

## 2020-03-22 MED ORDER — PREDNISONE 10 MG (21) PO TBPK: ORAL_TABLET | Freq: Every day | ORAL | 0 refills | Status: DC

## 2020-03-22 NOTE — Patient Instructions (Addendum)
It is good to talk with you today. We will send in a z pack Take until gone Use probiotic while on antibiotic, or eat Activia Yogurt We will send in a prednisone taper Prednisone taper; 10 mg tablets: 4 tabs x 2 days, 3 tabs x 2 days, 2 tabs x 2 days 1 tab x 2 days then stop. Take with food.  Robitussin and Delsym for cough as needed Take as directed. Sips of water instead of throat clearing.  Continue Mucinex as you have been doing. Take with plenty of water. . Continue your Anoro daily as you have been doing Rinse mouth after use. Continue your Rescue inhaler as needed Continue your allergy regimen per Dr. Carmelina Peal. Please call if you do not get better and we will see you in the office for follow up Please contact office for sooner follow up if symptoms do not improve or worsen or seek emergency care .

## 2020-03-24 DIAGNOSIS — J343 Hypertrophy of nasal turbinates: Secondary | ICD-10-CM | POA: Diagnosis not present

## 2020-03-24 DIAGNOSIS — J310 Chronic rhinitis: Secondary | ICD-10-CM | POA: Diagnosis not present

## 2020-03-24 DIAGNOSIS — J0101 Acute recurrent maxillary sinusitis: Secondary | ICD-10-CM | POA: Diagnosis not present

## 2020-04-14 DIAGNOSIS — J343 Hypertrophy of nasal turbinates: Secondary | ICD-10-CM | POA: Diagnosis not present

## 2020-04-14 DIAGNOSIS — J0101 Acute recurrent maxillary sinusitis: Secondary | ICD-10-CM | POA: Diagnosis not present

## 2020-04-14 DIAGNOSIS — J310 Chronic rhinitis: Secondary | ICD-10-CM | POA: Diagnosis not present

## 2020-05-08 DIAGNOSIS — J449 Chronic obstructive pulmonary disease, unspecified: Secondary | ICD-10-CM | POA: Diagnosis not present

## 2020-05-08 DIAGNOSIS — J309 Allergic rhinitis, unspecified: Secondary | ICD-10-CM | POA: Diagnosis not present

## 2020-05-08 DIAGNOSIS — J320 Chronic maxillary sinusitis: Secondary | ICD-10-CM | POA: Diagnosis not present

## 2020-05-08 DIAGNOSIS — R059 Cough, unspecified: Secondary | ICD-10-CM | POA: Diagnosis not present

## 2020-05-22 DIAGNOSIS — J343 Hypertrophy of nasal turbinates: Secondary | ICD-10-CM | POA: Diagnosis not present

## 2020-05-22 DIAGNOSIS — J0101 Acute recurrent maxillary sinusitis: Secondary | ICD-10-CM | POA: Diagnosis not present

## 2020-05-22 DIAGNOSIS — J310 Chronic rhinitis: Secondary | ICD-10-CM | POA: Diagnosis not present

## 2020-05-23 ENCOUNTER — Ambulatory Visit: Payer: Medicare Other | Admitting: Allergy and Immunology

## 2020-06-03 DIAGNOSIS — J329 Chronic sinusitis, unspecified: Secondary | ICD-10-CM | POA: Diagnosis not present

## 2020-06-03 DIAGNOSIS — Z1152 Encounter for screening for COVID-19: Secondary | ICD-10-CM | POA: Diagnosis not present

## 2020-06-16 DIAGNOSIS — J309 Allergic rhinitis, unspecified: Secondary | ICD-10-CM | POA: Diagnosis not present

## 2020-06-16 DIAGNOSIS — J320 Chronic maxillary sinusitis: Secondary | ICD-10-CM | POA: Diagnosis not present

## 2020-06-16 DIAGNOSIS — J449 Chronic obstructive pulmonary disease, unspecified: Secondary | ICD-10-CM | POA: Diagnosis not present

## 2020-06-16 DIAGNOSIS — Z1152 Encounter for screening for COVID-19: Secondary | ICD-10-CM | POA: Diagnosis not present

## 2020-06-16 DIAGNOSIS — R059 Cough, unspecified: Secondary | ICD-10-CM | POA: Diagnosis not present

## 2020-06-20 ENCOUNTER — Ambulatory Visit (INDEPENDENT_AMBULATORY_CARE_PROVIDER_SITE_OTHER): Payer: Medicare Other | Admitting: Allergy and Immunology

## 2020-06-20 ENCOUNTER — Other Ambulatory Visit: Payer: Self-pay

## 2020-06-20 VITALS — BP 140/90 | HR 84 | Temp 97.3°F | Resp 14 | Ht 59.0 in | Wt 104.0 lb

## 2020-06-20 DIAGNOSIS — K219 Gastro-esophageal reflux disease without esophagitis: Secondary | ICD-10-CM | POA: Diagnosis not present

## 2020-06-20 DIAGNOSIS — G500 Trigeminal neuralgia: Secondary | ICD-10-CM | POA: Diagnosis not present

## 2020-06-20 DIAGNOSIS — J439 Emphysema, unspecified: Secondary | ICD-10-CM

## 2020-06-20 DIAGNOSIS — B999 Unspecified infectious disease: Secondary | ICD-10-CM

## 2020-06-20 DIAGNOSIS — J3089 Other allergic rhinitis: Secondary | ICD-10-CM

## 2020-06-20 DIAGNOSIS — J4540 Moderate persistent asthma, uncomplicated: Secondary | ICD-10-CM | POA: Diagnosis not present

## 2020-06-20 MED ORDER — MONTELUKAST SODIUM 10 MG PO TABS: ORAL_TABLET | ORAL | 5 refills | Status: DC

## 2020-06-20 MED ORDER — OLOPATADINE HCL 0.6 % NA SOLN: 1.0000 | Freq: Two times a day (BID) | NASAL | 5 refills | Status: DC

## 2020-06-20 MED ORDER — ALBUTEROL SULFATE HFA 108 (90 BASE) MCG/ACT IN AERS: 2.0000 | INHALATION_SPRAY | Freq: Four times a day (QID) | RESPIRATORY_TRACT | 10 refills | Status: DC | PRN

## 2020-06-20 MED ORDER — FLUTICASONE PROPIONATE 50 MCG/ACT NA SUSP: 1.0000 | Freq: Every day | NASAL | 5 refills | Status: DC | PRN

## 2020-06-20 MED ORDER — OMEPRAZOLE 40 MG PO CPDR: DELAYED_RELEASE_CAPSULE | ORAL | 5 refills | Status: DC

## 2020-06-20 MED ORDER — FAMOTIDINE 40 MG PO TABS: ORAL_TABLET | ORAL | 5 refills | Status: DC

## 2020-06-20 NOTE — Progress Notes (Signed)
[facility]   Follow-up Note  Referring Provider: Ginger Organ., MD Primary Provider: Ginger Organ., MD Date of Office Visit: 06/20/2020  Subjective:   Pamela Larson (DOB: Jan 01, 1946) is a 75 y.o. female who returns to the [facility] on 06/20/2020 in re-evaluation of the following:  HPI: Pamela Larson returns to this clinic in evaluation of emphysema with possible component of asthma, chronic rhinitis, LPR, and history of left-sided facial pain syndrome.  I last saw her in his clinic on 23 November 2019.  Apparently she has been having "sinus infections" and is now under the care of Dr. Benjamine Mola.  She states that she required an antibiotic and a steroid twice earlier this spring and most recently she became "sick" with a very bad headache and head fullness and a low-grade temperature of 99.2 for which she went to the urgent care center 4 days ago and was given a 5 mg Pred pack and Levaquin.  She is really not much better today.  Apparently she had negative COVID and flu testing.  This is occurring even though she uses Flonase and montelukast on a consistent basis and take some Mucinex and antihistamines and Sudafed.  Other than these events her airway has actually done well.  She has had very little problems with her lower airway.  Apparently she has not had any type of infection of her lower airway and rarely has any lower airway symptoms and does not use a short acting bronchodilator while she continues on a collection of agents for her airway.  She believes that her reflux is under very good control with a combination of omeprazole and famotidine.  Allergies as of 06/20/2020      Reactions   Amoxicillin Nausea And Vomiting   Has patient had a PCN reaction causing immediate rash, facial/tongue/throat swelling, SOB or lightheadedness with hypotension: #  #  #  YES  #  #  #  Has patient had a PCN reaction causing severe rash  involving mucus membranes or skin necrosis: No Has patient had a PCN reaction that required hospitalization No Has patient had a PCN reaction occurring within the last 10 years: #  #  #  YES  #  #  #  If all of the above answers are "NO", then may proceed with Cephalosporin use.   Augmentin [amoxicillin-pot Clavulanate] Nausea And Vomiting   Demerol [meperidine Hcl] Nausea And Vomiting   Sulfa Antibiotics Nausea And Vomiting      Medication List      acetaminophen 500 MG tablet Commonly known as: TYLENOL Take 1,000 mg by mouth every 6 (six) hours as needed for mild pain. For pain   albuterol 108 (90 Base) MCG/ACT inhaler Commonly known as: VENTOLIN HFA Inhale 2 puffs into the lungs every 6 (six) hours as needed for wheezing or shortness of breath. For shortness of breath   ALPRAZolam 0.25 MG tablet Commonly known as: XANAX Take 0.125 mg by mouth daily as needed for anxiety. Pt takes 1/2 tablet as needed   Anoro Ellipta 62.5-25 MCG/INH Aepb Generic drug: umeclidinium-vilanterol INHALE 1 PUFF INTO THE LUNGS DAILY   azithromycin 250 MG tablet Commonly known as: ZITHROMAX Take 1 tablet (250 mg total) by mouth daily.   calcium carbonate 1250 (500 Ca) MG tablet Commonly known as: OS-CAL - dosed in mg of elemental calcium Take 1 tablet by mouth daily.   famotidine 40 MG tablet  Commonly known as: PEPCID TAKE 1 TABLET(40 MG) BY MOUTH EVERY EVENING   FLUoxetine 20 MG tablet Commonly known as: PROZAC Take 20 mg by mouth 2 (two) times daily.   fluticasone 50 MCG/ACT nasal spray Commonly known as: FLONASE Place 1 spray into both nostrils daily as needed for allergies or rhinitis.   levothyroxine 100 MCG tablet Commonly known as: SYNTHROID Take 50-100 mcg by mouth See admin instructions. TAKES 100 MCG DAILY EXCEPT ON THURSDAYS AND TAKES 50 MCG   Melatonin 3 MG Caps Take 3 mg by mouth at bedtime as needed (SLEEP).   montelukast 10 MG tablet Commonly known as: SINGULAIR TAKE  1 TABLET(10 MG) BY MOUTH AT BEDTIME   Naproxen Sodium 220 MG Caps Take by mouth.   omeprazole 40 MG capsule Commonly known as: PRILOSEC Take one capsule by mouth every morning.   predniSONE 10 MG (21) Tbpk tablet Commonly known as: STERAPRED UNI-PAK 21 TAB Take by mouth daily. Take 4 tabs for 2 days, 3 tabs for 2 days, 2 tabs for 2 days then 1 tab for 2 days then stop   pseudoephedrine 30 MG tablet Commonly known as: SUDAFED Take 30 mg by mouth every 4 (four) hours as needed for congestion.   rosuvastatin 10 MG tablet Commonly known as: CRESTOR   Vitamin D (Ergocalciferol) 1.25 MG (50000 UNIT) Caps capsule Commonly known as: DRISDOL Take 50,000 Units by mouth every 7 (seven) days. On Mondays       Past Medical History:  Diagnosis Date  . Anemia    pernicious anemia - 1991   . Anxiety   . Arthritis    rheumatoid arthritis   . Asthma   . Chronic fatigue   . Cystitis, interstitial   . Depression   . Disc disorder of lumbar region 2013  . Fibromyalgia   . GERD (gastroesophageal reflux disease)   . History of kidney stones   . Hypothyroidism   . Lyme disease    hx of possible   . 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 Osteoporosis   . Pneumonia    hx of   . Rheumatoid arthritis [facility])     Past Surgical History:  Procedure Laterality Date  . APPENDECTOMY  1978  . bladder hydrodistention  1998  . BREAST SURGERY     reduction   . CERVICAL DISC ARTHROPLASTY N/A 04/03/2016   Procedure: CERVICAL SIX CERVICAL SEVEN [place] ARTHROPLASTY;  Surgeon: Jovita Gamma, MD;  Location: [facility];  Service: Neurosurgery;  Laterality: N/A;  left side approach  . COSMETIC SURGERY  2005   face lift  . EXCISION MORTON'S NEUROMA Left 05/08/2017   Procedure: EXCISION MORTON'S NEUROMA Left 3rd webspace;  Surgeon: [name], MD;  Location: [facility];  Service: Orthopedics;  Laterality: Left;  . EYE SURGERY     lower eye lids   . LUMBAR LAMINECTOMY/DECOMPRESSION MICRODISCECTOMY  06/10/2011   Procedure: LUMBAR LAMINECTOMY/DECOMPRESSION MICRODISCECTOMY 1 LEVEL;  Surgeon: Hosie Spangle, MD;  Location: [facility] NEURO ORS;  Service: Neurosurgery;  Laterality: Left;  Lumbar Five Sacral One Lumbar Laminectomy Decompression Microdiscectomy     Review of systems negative except as noted in HPI / PMHx or noted below:  Review of Systems  Constitutional: Negative.   HENT: Negative.   Eyes: Negative.   Respiratory: Negative.   Cardiovascular: Negative.   Gastrointestinal: Negative.   Genitourinary: Negative.   Musculoskeletal: Negative.   Skin: Negative.  Neurological: Negative.   Endo/Heme/Allergies: Negative.   Psychiatric/Behavioral: Negative.      Objective:   Vitals:   06/20/20 1600  BP: 140/90  Pulse: 84  Resp: 14  Temp: (!) 97.3 F (36.3 C)  SpO2: 96%   Height: 4\' 11"  (149.9 cm)  Weight: 104 lb (47.2 kg)   Physical Exam Constitutional:      Appearance: She is not diaphoretic.  HENT:     Head: Normocephalic.     Right Ear: Tympanic membrane, ear canal and external ear normal.     Left Ear: Tympanic membrane, ear canal and external ear normal.     Nose: Nose normal. No mucosal edema or rhinorrhea.     Mouth/Throat:     Pharynx: Uvula midline. No oropharyngeal exudate.  Eyes:     Conjunctiva/sclera: Conjunctivae normal.  Neck:     Thyroid: No thyromegaly.     Trachea: Trachea normal. No tracheal tenderness or tracheal deviation.  Cardiovascular:     Rate and Rhythm: Normal rate and regular rhythm.     Heart sounds: Normal heart sounds, S1 normal and S2 normal. No murmur heard.   Pulmonary:     Effort: No respiratory distress.     Breath sounds: Normal breath sounds. No stridor. No wheezing or rales.  Lymphadenopathy:     Head:     Right side of head: No tonsillar adenopathy.     Left side of head: No tonsillar adenopathy.     Cervical: No cervical adenopathy.  Skin:    Findings: No  erythema or rash.     Nails: There is no clubbing.  Neurological:     Mental Status: She is alert.     Diagnostics:    Spirometry was performed and demonstrated an FEV1 of 1.57 at 58 % of predicted.  Assessment and Plan:   1. Pulmonary emphysema, unspecified emphysema type ([place])   2. Asthma, moderate persistent, well-controlled   3. Perennial allergic rhinitis   4. LPRD (laryngopharyngeal reflux disease)   5. Facial pain syndrome   6. Recurrent infections     1.  Continue to Treat and prevent inflammation:   A.  Flonase 1 spray each nostril 2 times per day  B.  Patanase - 1 spray each nostril 2 times per day  B.  montelukast 10 mg tablet 1 time per day  C.  Anoro - 1 inhalation 1 time per day  2.  Continue to Treat and prevent reflux:   A.  Omeprazole 40 mg tablet in a.m.  B.  Famotidine 40 mg mg tablet in p.m.  3.  If needed:   A.  OTC antihistamine. Mucinex, Sudafed  B.  Nasal saline  C.  Albuterol HFA - 2 inhalations every 4-6 hours  4. Blood - area 2 aeroallergen profile  5. Return to clinic in 6 months or earlier if problem.    Bruce has been having recurrent upper respiratory tract infections this spring.  We will see if she is allergic to springtime pollens with the blood test noted above.  I have encouraged her to consistently use Flonase and a nasal antihistamine along with her leukotrienes modifier to address her upper airway inflammation.  She can continue to treat reflux as noted above.  I will contact her with the results of her blood test once it is available for review.  Allena Katz, MD Allergy / Immunology [place]

## 2020-06-20 NOTE — Patient Instructions (Addendum)
  1.  Continue to Treat and prevent inflammation:   A.  Flonase 1 spray each nostril 2 times per day  B.  Patanase - 1 spray each nostril 2 times per day  B.  montelukast 10 mg tablet 1 time per day  C.  Anoro - 1 inhalation 1 time per day  2.  Continue to Treat and prevent reflux:   A.  Omeprazole 40 mg tablet in a.m.  B.  Famotidine 40 mg mg tablet in p.m.  3.  If needed:   A.  OTC antihistamine. Mucinex, Sudafed  B.  Nasal saline  C.  Albuterol HFA - 2 inhalations every 4-6 hours  4. Blood - area 2 aeroallergen profile  5. Return to clinic in 6 months or earlier if problem.

## 2020-06-21 ENCOUNTER — Encounter: Payer: Self-pay | Admitting: Allergy and Immunology

## 2020-06-26 LAB — ALLERGENS W/TOTAL IGE AREA 2

## 2020-06-28 ENCOUNTER — Telehealth: Payer: Self-pay | Admitting: Allergy and Immunology

## 2020-06-28 NOTE — Telephone Encounter (Signed)
Pamela Larson had called about lab results I have returned the call and informed the pt of the results

## 2020-06-28 NOTE — Telephone Encounter (Signed)
Patient states someone called from allergy and asthma was returning the call patient did not know who called

## 2020-07-13 DIAGNOSIS — Z23 Encounter for immunization: Secondary | ICD-10-CM | POA: Diagnosis not present

## 2020-08-09 ENCOUNTER — Other Ambulatory Visit: Payer: Self-pay | Admitting: Allergy and Immunology

## 2020-08-19 IMAGING — CR DG CHEST 2V
2 series · 2 of 2 positions shown · non-contrast
Comparison: Chest x-rays 11/06/2014 and earlier. High-resolution
chest CT 08/27/2017.

CLINICAL DATA: 73-year-old who fell four days ago and complains of
RIGHT LOWER chest pain, possible pleurodynia. Initial encounter.

EXAM:
CHEST - 2 VIEW

[w chest pa]
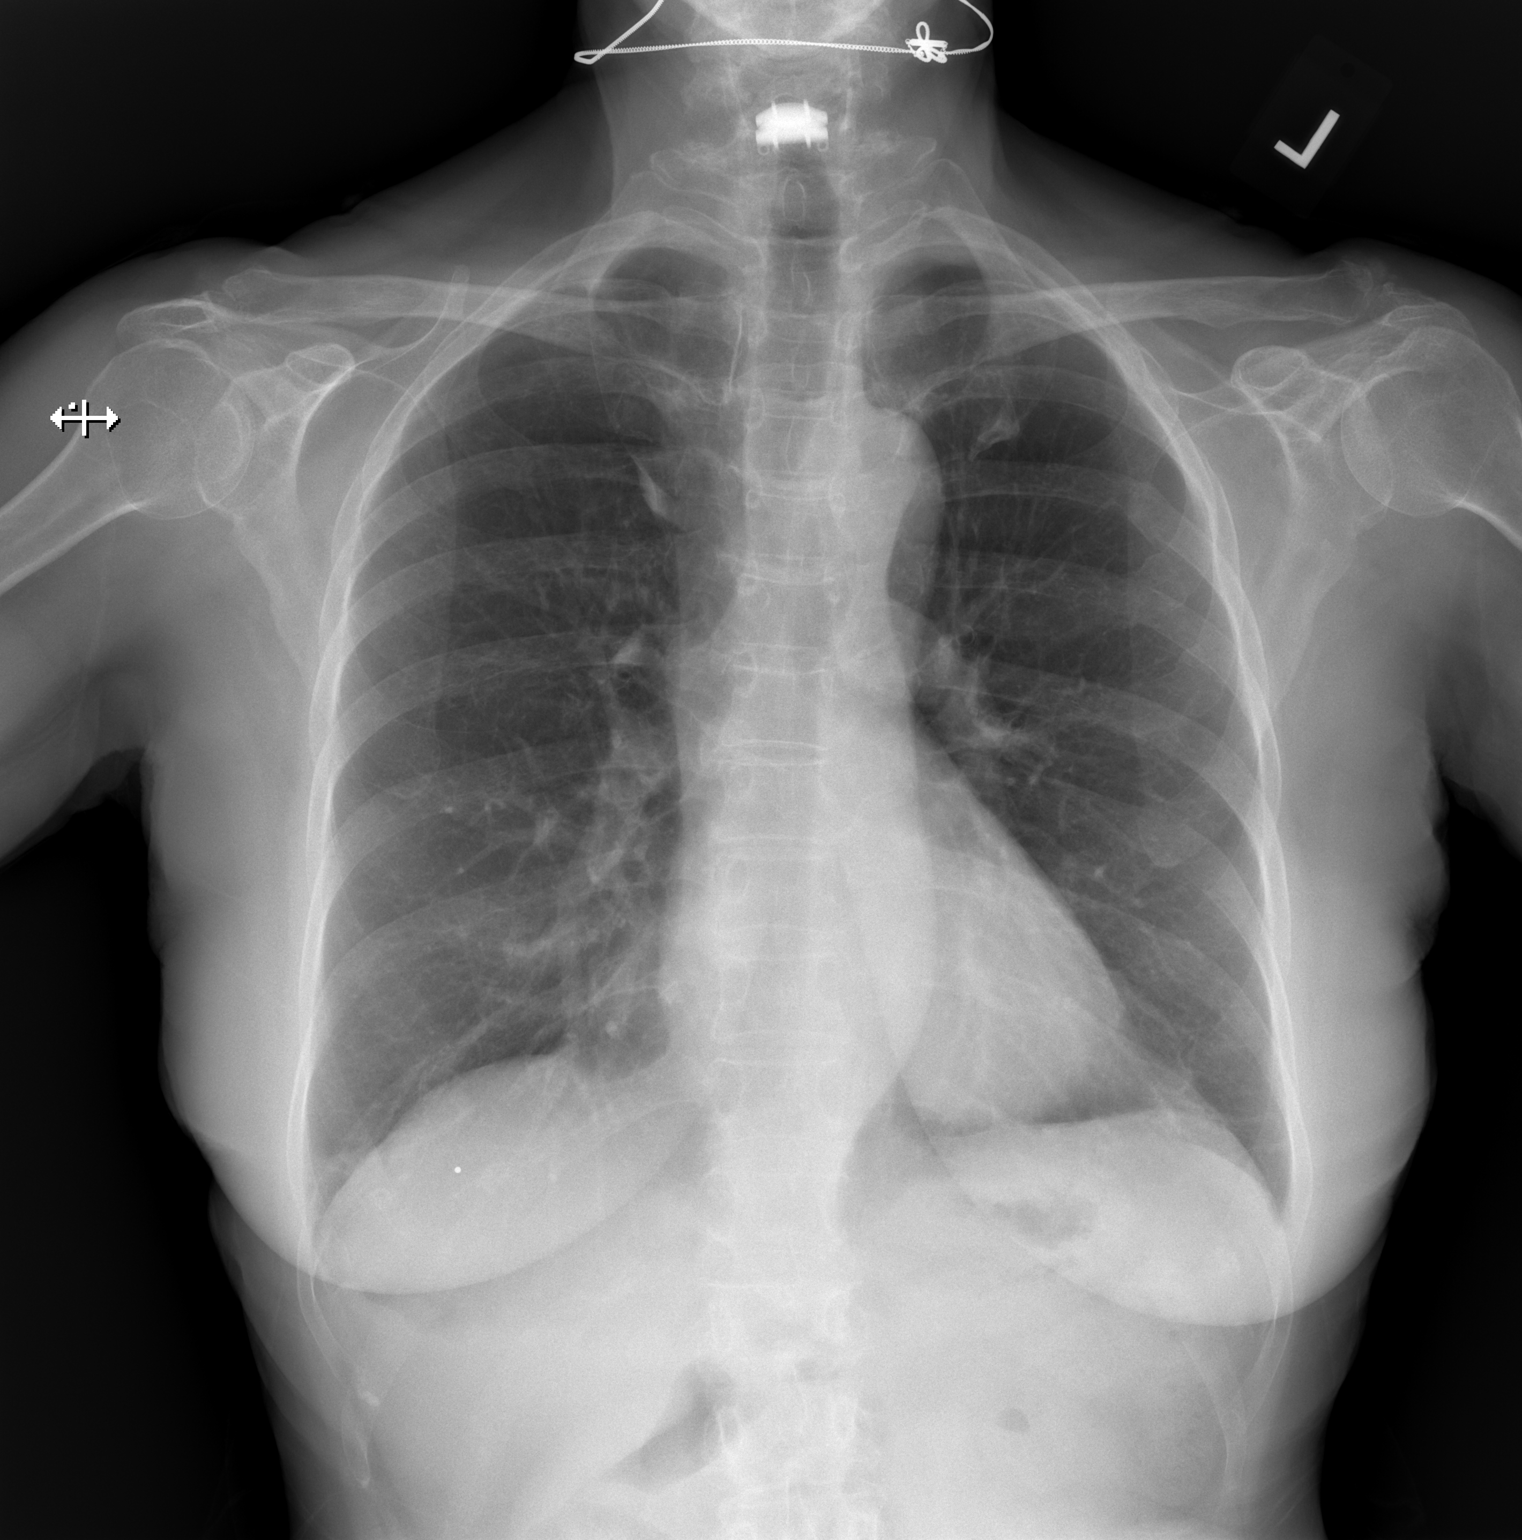

[w chest lat]
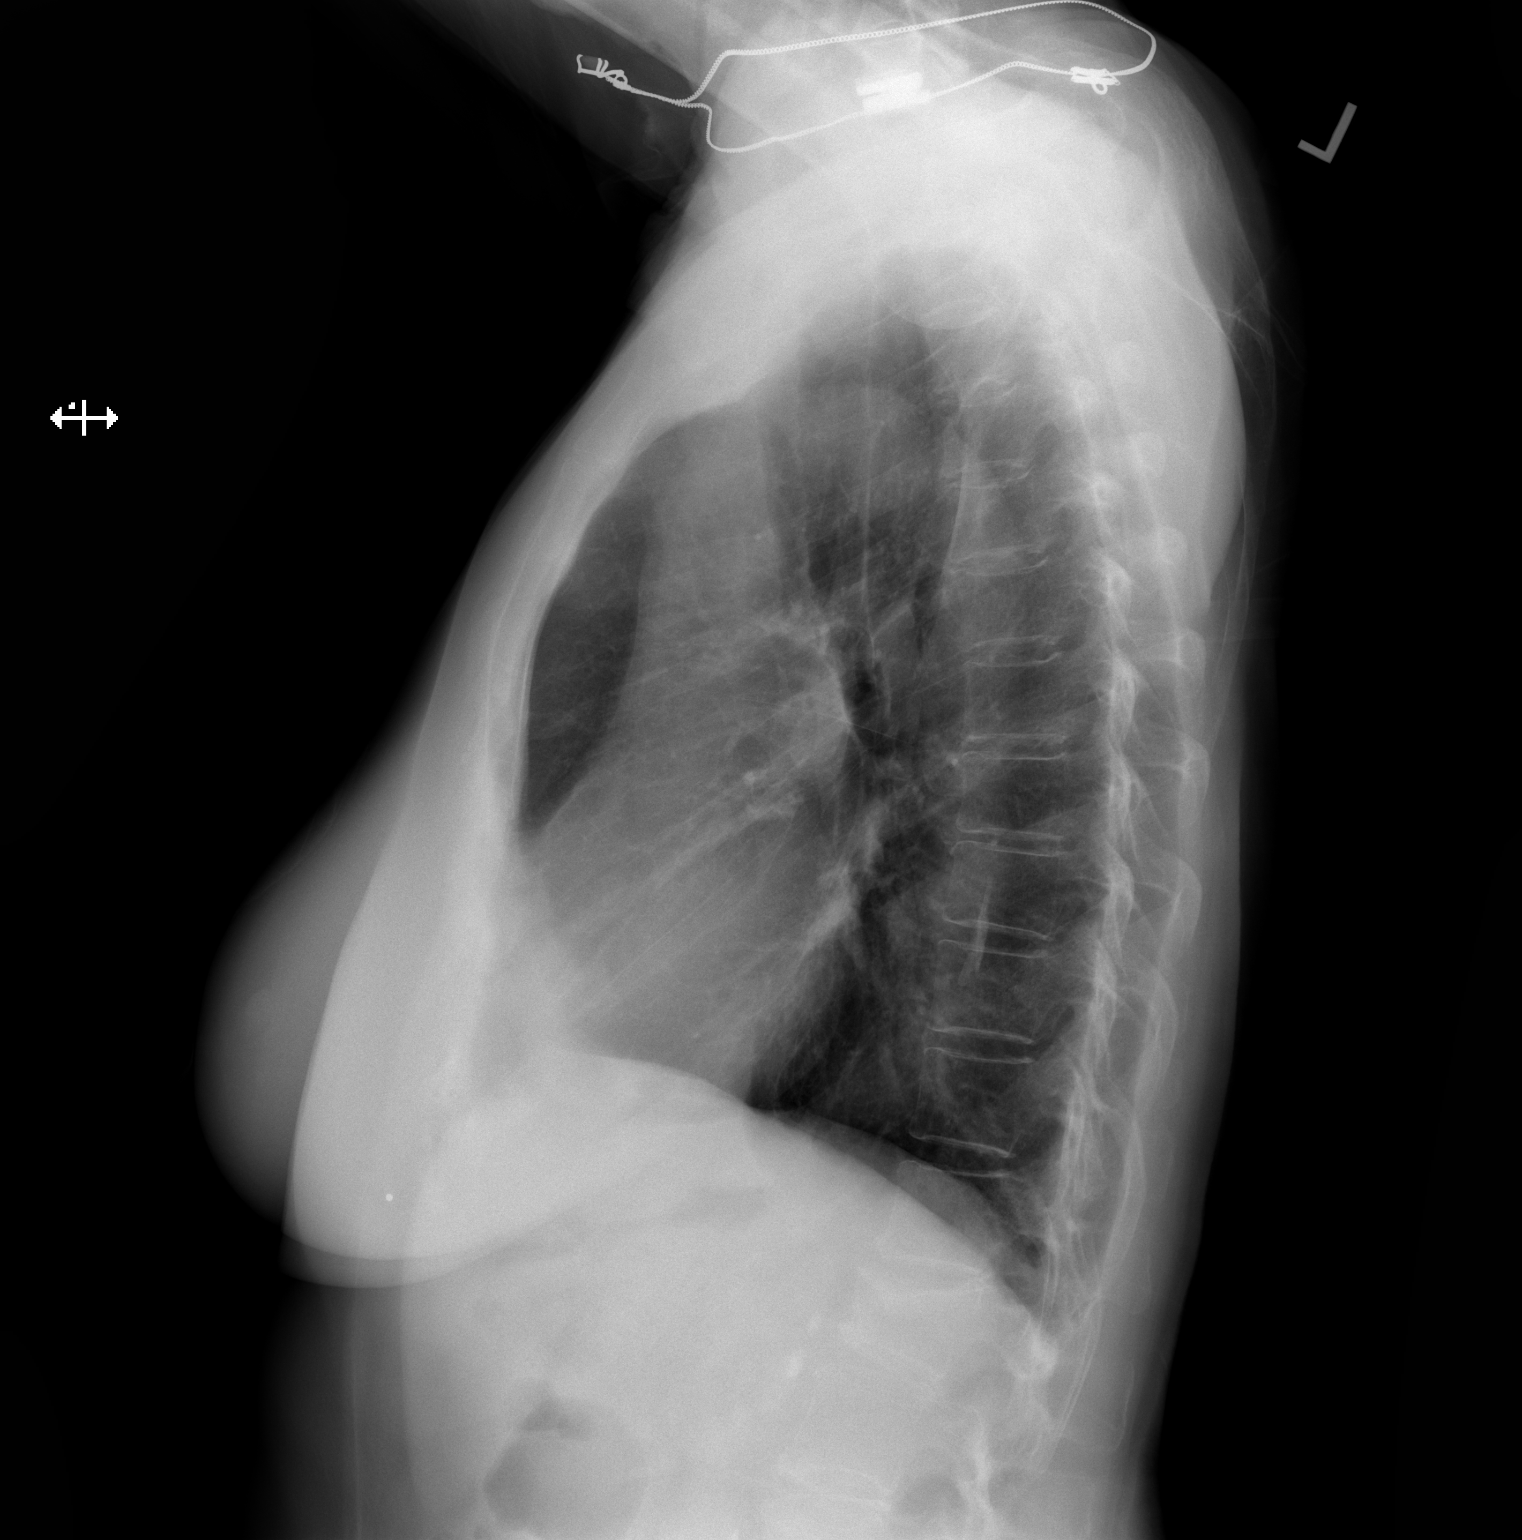

[2 of 2 positions shown; findings below may reference images not displayed]

FINDINGS: Cardiac silhouette normal in size, unchanged. Thoracic aorta
tortuous and mildly atherosclerotic, unchanged. Hilar and
mediastinal contours otherwise unremarkable. Azygos fissure. Stable
mild hyperinflation. Mild central peribronchial thickening,
unchanged. No new pulmonary parenchymal abnormalities. No pleural
effusions. Remote L1 compression fracture, unchanged.
IMPRESSION: Stable mild hyperinflation and central peribronchial thickening
indicating COPD and/or asthma. No acute cardiopulmonary disease.

## 2020-09-05 DIAGNOSIS — Z87891 Personal history of nicotine dependence: Secondary | ICD-10-CM | POA: Diagnosis not present

## 2020-09-05 DIAGNOSIS — M542 Cervicalgia: Secondary | ICD-10-CM | POA: Diagnosis not present

## 2020-09-05 DIAGNOSIS — I10 Essential (primary) hypertension: Secondary | ICD-10-CM | POA: Diagnosis not present

## 2020-09-05 DIAGNOSIS — Z03821 Encounter for observation for suspected ingested foreign body ruled out: Secondary | ICD-10-CM | POA: Diagnosis not present

## 2020-09-05 DIAGNOSIS — Z880 Allergy status to penicillin: Secondary | ICD-10-CM | POA: Diagnosis not present

## 2020-09-05 DIAGNOSIS — T182XXA Foreign body in stomach, initial encounter: Secondary | ICD-10-CM | POA: Diagnosis not present

## 2020-09-05 DIAGNOSIS — K219 Gastro-esophageal reflux disease without esophagitis: Secondary | ICD-10-CM | POA: Diagnosis not present

## 2020-09-05 DIAGNOSIS — Z885 Allergy status to narcotic agent status: Secondary | ICD-10-CM | POA: Diagnosis not present

## 2020-09-05 DIAGNOSIS — J449 Chronic obstructive pulmonary disease, unspecified: Secondary | ICD-10-CM | POA: Diagnosis not present

## 2020-09-05 DIAGNOSIS — Z79899 Other long term (current) drug therapy: Secondary | ICD-10-CM | POA: Diagnosis not present

## 2020-09-05 DIAGNOSIS — E039 Hypothyroidism, unspecified: Secondary | ICD-10-CM | POA: Diagnosis not present

## 2020-09-05 DIAGNOSIS — T188XXA Foreign body in other parts of alimentary tract, initial encounter: Secondary | ICD-10-CM | POA: Diagnosis not present

## 2020-09-05 DIAGNOSIS — Z882 Allergy status to sulfonamides status: Secondary | ICD-10-CM | POA: Diagnosis not present

## 2020-09-05 DIAGNOSIS — R918 Other nonspecific abnormal finding of lung field: Secondary | ICD-10-CM | POA: Diagnosis not present

## 2020-09-05 DIAGNOSIS — T189XXA Foreign body of alimentary tract, part unspecified, initial encounter: Secondary | ICD-10-CM | POA: Diagnosis not present

## 2020-10-24 DIAGNOSIS — J310 Chronic rhinitis: Secondary | ICD-10-CM | POA: Diagnosis not present

## 2020-10-24 DIAGNOSIS — J343 Hypertrophy of nasal turbinates: Secondary | ICD-10-CM | POA: Diagnosis not present

## 2020-10-24 DIAGNOSIS — J320 Chronic maxillary sinusitis: Secondary | ICD-10-CM | POA: Diagnosis not present

## 2020-11-02 DIAGNOSIS — Z23 Encounter for immunization: Secondary | ICD-10-CM | POA: Diagnosis not present

## 2020-11-30 DIAGNOSIS — D510 Vitamin B12 deficiency anemia due to intrinsic factor deficiency: Secondary | ICD-10-CM | POA: Diagnosis not present

## 2020-11-30 DIAGNOSIS — Z23 Encounter for immunization: Secondary | ICD-10-CM | POA: Diagnosis not present

## 2020-12-01 DIAGNOSIS — J029 Acute pharyngitis, unspecified: Secondary | ICD-10-CM | POA: Diagnosis not present

## 2020-12-01 DIAGNOSIS — J320 Chronic maxillary sinusitis: Secondary | ICD-10-CM | POA: Diagnosis not present

## 2020-12-01 DIAGNOSIS — Z1152 Encounter for screening for COVID-19: Secondary | ICD-10-CM | POA: Diagnosis not present

## 2020-12-01 DIAGNOSIS — J309 Allergic rhinitis, unspecified: Secondary | ICD-10-CM | POA: Diagnosis not present

## 2020-12-13 ENCOUNTER — Other Ambulatory Visit: Payer: Self-pay | Admitting: Allergy and Immunology

## 2020-12-15 DIAGNOSIS — J343 Hypertrophy of nasal turbinates: Secondary | ICD-10-CM | POA: Diagnosis not present

## 2020-12-15 DIAGNOSIS — J310 Chronic rhinitis: Secondary | ICD-10-CM | POA: Diagnosis not present

## 2020-12-15 DIAGNOSIS — J324 Chronic pansinusitis: Secondary | ICD-10-CM | POA: Diagnosis not present

## 2020-12-26 ENCOUNTER — Ambulatory Visit (INDEPENDENT_AMBULATORY_CARE_PROVIDER_SITE_OTHER): Payer: Medicare Other | Admitting: Allergy and Immunology

## 2020-12-26 ENCOUNTER — Other Ambulatory Visit: Payer: Self-pay

## 2020-12-26 ENCOUNTER — Encounter: Payer: Self-pay | Admitting: Allergy and Immunology

## 2020-12-26 VITALS — BP 118/84 | HR 90 | Temp 97.7°F | Resp 18 | Ht 59.0 in | Wt 103.8 lb

## 2020-12-26 DIAGNOSIS — K219 Gastro-esophageal reflux disease without esophagitis: Secondary | ICD-10-CM | POA: Diagnosis not present

## 2020-12-26 DIAGNOSIS — J439 Emphysema, unspecified: Secondary | ICD-10-CM | POA: Diagnosis not present

## 2020-12-26 DIAGNOSIS — G500 Trigeminal neuralgia: Secondary | ICD-10-CM | POA: Diagnosis not present

## 2020-12-26 DIAGNOSIS — J4540 Moderate persistent asthma, uncomplicated: Secondary | ICD-10-CM | POA: Diagnosis not present

## 2020-12-26 DIAGNOSIS — J3089 Other allergic rhinitis: Secondary | ICD-10-CM | POA: Diagnosis not present

## 2020-12-26 MED ORDER — UMECLIDINIUM-VILANTEROL 62.5-25 MCG/ACT IN AEPB
1.0000 | INHALATION_SPRAY | Freq: Every day | RESPIRATORY_TRACT | 5 refills | Status: DC
Start: 2020-12-26 — End: 2021-06-26

## 2020-12-26 MED ORDER — OLOPATADINE HCL 0.6 % NA SOLN: 1.0000 | Freq: Two times a day (BID) | NASAL | 5 refills | Status: DC

## 2020-12-26 MED ORDER — FAMOTIDINE 40 MG PO TABS: 40.0000 mg | ORAL_TABLET | Freq: Every evening | ORAL | 5 refills | Status: DC

## 2020-12-26 MED ORDER — FLUTICASONE PROPIONATE 50 MCG/ACT NA SUSP
1.0000 | Freq: Every day | NASAL | 5 refills | Status: DC | PRN
Start: 2020-12-26 — End: 2021-06-26

## 2020-12-26 MED ORDER — OMEPRAZOLE 40 MG PO CPDR: 40.0000 mg | DELAYED_RELEASE_CAPSULE | Freq: Every morning | ORAL | 5 refills | Status: DC

## 2020-12-26 MED ORDER — MONTELUKAST SODIUM 10 MG PO TABS: 10.0000 mg | ORAL_TABLET | Freq: Every evening | ORAL | 5 refills | Status: DC

## 2020-12-26 MED ORDER — IPRATROPIUM BROMIDE 0.03 % NA SOLN: 1.0000 | Freq: Two times a day (BID) | NASAL | 5 refills | Status: DC

## 2020-12-26 MED ORDER — ALBUTEROL SULFATE HFA 108 (90 BASE) MCG/ACT IN AERS: 2.0000 | INHALATION_SPRAY | RESPIRATORY_TRACT | 1 refills | Status: DC | PRN

## 2020-12-26 NOTE — Patient Instructions (Addendum)
  1.  Continue to Treat and prevent inflammation:   A.  Flonase 1 spray each nostril 2 times per day  B.  Patanase - 1 spray each nostril 2 times per day  B.  montelukast 10 mg tablet 1 time per day  C.  Anoro - 1 inhalation 1 time per day  2.  Continue to Treat and prevent reflux:   A.  Omeprazole 40 mg tablet in a.m.  B.  Famotidine 40 mg mg tablet in p.m.  3.  If needed:   A.  OTC antihistamine. Mucinex, Sudafed  B.  Nasal saline  C.  Albuterol HFA - 2 inhalations every 4-6 hours  D.  Ipratropium 0.03% -1-2 sprays each nostril 1-2 times a day    4. Return to clinic in 6 months or earlier if problem.

## 2020-12-26 NOTE — Progress Notes (Signed)
[facility]   Follow-up Note  Referring Provider: Ginger Organ., MD Primary Provider: Ginger Organ., MD Date of Office Visit: 12/26/2020  Subjective:   Pamela Larson (DOB: 04-Oct-1945) is a 75 y.o. 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  Her last visit to this clinic was 20 Jun 2020.  Overall she feels as though she has had a very good interval of time regarding her respiratory tract.  She has not had any "sinus infections" other than the fact that in September she developed a head cold quickly followed by her left facial pain syndrome for which she was treated with a Z-Pak by her ENT doctor.  Otherwise, she has had no problems with her pain and she has not had any headaches and she had very little issues with her nose while she continues to use a large collection of medical therapy directed against inflammation affecting her respiratory tract.  She has had very little lower airway issues.  She does not use a short acting bronchodilator.  She continues on a LABA LAMA combination inhaler on a consistent basis.  Her reflux is doing okay on her current plan of omeprazole and famotidine.  She has received 5 COVID vaccines and has also received this year's flu vaccine.  Allergies as of 12/26/2020       Reactions   Amoxicillin Nausea And Vomiting   Has patient had a PCN reaction causing immediate rash, facial/tongue/throat swelling, SOB or lightheadedness with hypotension: #  #  #  YES  #  #  #  Has patient had a PCN reaction causing severe rash involving mucus membranes or skin necrosis: No Has patient had a PCN reaction that required hospitalization No Has patient had a PCN reaction occurring  within the last 10 years: #  #  #  YES  #  #  #  If all of the above answers are "NO", then may proceed with Cephalosporin use.   Augmentin [amoxicillin-pot Clavulanate] Nausea And Vomiting   Demerol [meperidine Hcl] Nausea And Vomiting   Sulfa Antibiotics Nausea And Vomiting        Medication List    acetaminophen 500 MG tablet Commonly known as: TYLENOL Take 1,000 mg by mouth every 6 (six) hours as needed for mild pain. For pain   albuterol 108 (90 Base) MCG/ACT inhaler Commonly known as: VENTOLIN HFA Inhale 2 puffs into the lungs every 4 (four) hours as needed for wheezing or shortness of breath. For shortness of breath   ALPRAZolam 0.25 MG tablet Commonly known as: XANAX Take 0.125 mg by mouth daily as needed for anxiety. Pt takes 1/2 tablet as needed   calcium carbonate 1250 (500 Ca) MG tablet Commonly known as: OS-CAL - dosed in mg of elemental calcium Take 1 tablet by mouth daily.   famotidine 40 MG tablet Commonly known as: PEPCID Take 1 tablet (40 mg total) by mouth at bedtime.   FLUoxetine 20 MG tablet Commonly known as: PROZAC Take 20 mg by mouth 2 (two) times daily.   fluticasone 50 MCG/ACT nasal spray Commonly known as: FLONASE Place 1 spray into both nostrils daily as needed for allergies or rhinitis.   ipratropium 0.03 % nasal spray Commonly  known as: ATROVENT Place 1 spray into both nostrils 2 (two) times daily.   levothyroxine 100 MCG tablet Commonly known as: SYNTHROID Take 50-100 mcg by mouth See admin instructions. TAKES 100 MCG DAILY EXCEPT ON THURSDAYS AND TAKES 50 MCG   Melatonin 3 MG Caps Take 3 mg by mouth at bedtime as needed (SLEEP).   montelukast 10 MG tablet Commonly known as: SINGULAIR Take 1 tablet (10 mg total) by mouth at bedtime.   Naproxen Sodium 220 MG Caps Take by mouth.   Olopatadine HCl 0.6 % Soln Place 1 spray into the nose in the morning and at bedtime.   omeprazole 40 MG capsule Commonly known as: PRILOSEC Take 1  capsule (40 mg total) by mouth in the morning. TAKE 1 CAPSULE BY MOUTH EVERY MORNING   pseudoephedrine 30 MG tablet Commonly known as: SUDAFED Take 30 mg by mouth every 4 (four) hours as needed for congestion.   rosuvastatin 10 MG tablet Commonly known as: CRESTOR   umeclidinium-vilanterol 62.5-25 MCG/ACT Aepb Commonly known as: Anoro Ellipta Inhale 1 puff into the lungs daily. What changed: medication strength Changed by: Mendi Constable Kevan Rosebush, MD   Vitamin D (Ergocalciferol) 1.25 MG (50000 UNIT) Caps capsule Commonly known as: DRISDOL Take 50,000 Units by mouth every 7 (seven) days. 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    Osteoporosis    Pneumonia    hx of    Rheumatoid arthritis ([place])     Past Surgical History:  Procedure Laterality Date   APPENDECTOMY  1978   bladder hydrodistention  1998   BREAST SURGERY     reduction    CERVICAL DISC ARTHROPLASTY N/A 04/03/2016   Procedure: CERVICAL SIX CERVICAL SEVEN [place] ARTHROPLASTY;  Surgeon: Jovita Gamma, MD;  Location: [facility];  Service: Neurosurgery;  Laterality: N/A;  left side approach   COSMETIC SURGERY  2005   face lift   EXCISION MORTON'S NEUROMA Left 05/08/2017   Procedure: EXCISION MORTON'S NEUROMA Left 3rd webspace;  Surgeon: [name], MD;  Location: [facility];  Service: Orthopedics;  Laterality: Left;   EYE SURGERY     lower eye lids   LUMBAR LAMINECTOMY/DECOMPRESSION MICRODISCECTOMY  06/10/2011   Procedure: LUMBAR LAMINECTOMY/DECOMPRESSION MICRODISCECTOMY 1 LEVEL;   Surgeon: Hosie Spangle, MD;  Location: [facility];  Service: Neurosurgery;  Laterality: Left;  Lumbar Five Sacral One Lumbar Laminectomy Decompression Microdiscectomy     Review of systems negative except as noted in HPI / PMHx or noted below:  Review of Systems  Constitutional: Negative.   HENT: Negative.    Eyes: Negative.   Respiratory: Negative.    Cardiovascular: Negative.   Gastrointestinal: Negative.   Genitourinary: Negative.   Musculoskeletal: Negative.   Skin: Negative.   Neurological: Negative.   Endo/Heme/Allergies: Negative.   Psychiatric/Behavioral: Negative.      Objective:   Vitals:   12/26/20 1352  BP:  118/84  Pulse: 90  Resp: 18  Temp: 97.7 F (36.5 C)  SpO2: 99%   Height: 4\' 11"  (149.9 cm)  Weight: 103 lb 12.8 oz (47.1 kg)   Physical Exam Constitutional:      Appearance: She is not diaphoretic.  HENT:     Head: Normocephalic.     Right Ear: Tympanic membrane, ear canal and external ear normal.     Left Ear: Tympanic membrane, ear canal and external ear normal.     Nose: Nose normal. No mucosal edema or rhinorrhea.     Mouth/Throat:     Pharynx: Uvula midline. No oropharyngeal exudate.  Eyes:     Conjunctiva/sclera: Conjunctivae normal.  Neck:     Thyroid: No thyromegaly.     Trachea: Trachea normal. No tracheal tenderness or tracheal deviation.  Cardiovascular:     Rate and Rhythm: Normal rate and regular rhythm.     Heart sounds: Normal heart sounds, S1 normal and S2 normal. No murmur heard. Pulmonary:     Effort: No respiratory distress.     Breath sounds: Normal breath sounds. No stridor. No wheezing or rales.  Lymphadenopathy:     Head:     Right side of head: No tonsillar adenopathy.     Left side of head: No tonsillar adenopathy.     Cervical: No cervical adenopathy.  Skin:    Findings: No erythema or rash.     Nails: There is no clubbing.  Neurological:     Mental Status: She is alert.    Diagnostics:    Spirometry  was performed and demonstrated an FEV1 of 1.54 at 87 % of predicted.  Assessment and Plan:   1. Pulmonary emphysema, unspecified emphysema type ([place])   2. Asthma, moderate persistent, well-controlled   3. Perennial allergic rhinitis   4. LPRD (laryngopharyngeal reflux disease)   5. Facial pain syndrome     1.  Continue to Treat and prevent inflammation:   A.  Flonase 1 spray each nostril 2 times per day  B.  Patanase - 1 spray each nostril 2 times per day  B.  montelukast 10 mg tablet 1 time per day  C.  Anoro - 1 inhalation 1 time per day  2.  Continue to Treat and prevent reflux:   A.  Omeprazole 40 mg tablet in a.m.  B.  Famotidine 40 mg mg tablet in p.m.  3.  If needed:   A.  OTC antihistamine. Mucinex, Sudafed  B.  Nasal saline  C.  Albuterol HFA - 2 inhalations every 4-6 hours  D.  Ipratropium 0.03% -1-2 sprays each nostril 1-2 times a day    4. Return to clinic in 6 months or earlier if problem.    Aunesti appears to be doing very well on her current plan of anti-inflammatory agents for her airway and therapy directed against reflux as noted above.  I am not really going to change much of her therapy today.  This has been a very good 8-month interval of time for her and hopefully she is going to maintain good control of her airway and her reflux issue on this plan as she moves forward over the course of the next 6 months.  Allena Katz, MD Allergy / Immunology [place]

## 2020-12-27 ENCOUNTER — Encounter: Payer: Self-pay | Admitting: Allergy and Immunology

## 2020-12-30 DIAGNOSIS — Z1231 Encounter for screening mammogram for malignant neoplasm of breast: Secondary | ICD-10-CM | POA: Diagnosis not present

## 2021-01-18 DIAGNOSIS — D510 Vitamin B12 deficiency anemia due to intrinsic factor deficiency: Secondary | ICD-10-CM | POA: Diagnosis not present

## 2021-01-24 DIAGNOSIS — Z7184 Encounter for health counseling related to travel: Secondary | ICD-10-CM | POA: Diagnosis not present

## 2021-01-24 DIAGNOSIS — H11151 Pinguecula, right eye: Secondary | ICD-10-CM | POA: Diagnosis not present

## 2021-01-24 DIAGNOSIS — B3781 Candidal esophagitis: Secondary | ICD-10-CM | POA: Diagnosis not present

## 2021-02-16 DIAGNOSIS — E559 Vitamin D deficiency, unspecified: Secondary | ICD-10-CM | POA: Diagnosis not present

## 2021-02-16 DIAGNOSIS — M810 Age-related osteoporosis without current pathological fracture: Secondary | ICD-10-CM | POA: Diagnosis not present

## 2021-02-16 DIAGNOSIS — E785 Hyperlipidemia, unspecified: Secondary | ICD-10-CM | POA: Diagnosis not present

## 2021-02-16 DIAGNOSIS — D510 Vitamin B12 deficiency anemia due to intrinsic factor deficiency: Secondary | ICD-10-CM | POA: Diagnosis not present

## 2021-02-16 DIAGNOSIS — I2510 Atherosclerotic heart disease of native coronary artery without angina pectoris: Secondary | ICD-10-CM | POA: Diagnosis not present

## 2021-02-16 DIAGNOSIS — E039 Hypothyroidism, unspecified: Secondary | ICD-10-CM | POA: Diagnosis not present

## 2021-02-23 DIAGNOSIS — E785 Hyperlipidemia, unspecified: Secondary | ICD-10-CM | POA: Diagnosis not present

## 2021-02-23 DIAGNOSIS — Z1339 Encounter for screening examination for other mental health and behavioral disorders: Secondary | ICD-10-CM | POA: Diagnosis not present

## 2021-02-23 DIAGNOSIS — Z1331 Encounter for screening for depression: Secondary | ICD-10-CM | POA: Diagnosis not present

## 2021-02-23 DIAGNOSIS — R5382 Chronic fatigue, unspecified: Secondary | ICD-10-CM | POA: Diagnosis not present

## 2021-02-23 DIAGNOSIS — I700 Atherosclerosis of aorta: Secondary | ICD-10-CM | POA: Diagnosis not present

## 2021-02-23 DIAGNOSIS — M810 Age-related osteoporosis without current pathological fracture: Secondary | ICD-10-CM | POA: Diagnosis not present

## 2021-02-23 DIAGNOSIS — D472 Monoclonal gammopathy: Secondary | ICD-10-CM | POA: Diagnosis not present

## 2021-02-23 DIAGNOSIS — D510 Vitamin B12 deficiency anemia due to intrinsic factor deficiency: Secondary | ICD-10-CM | POA: Diagnosis not present

## 2021-02-23 DIAGNOSIS — E559 Vitamin D deficiency, unspecified: Secondary | ICD-10-CM | POA: Diagnosis not present

## 2021-02-23 DIAGNOSIS — M0600 Rheumatoid arthritis without rheumatoid factor, unspecified site: Secondary | ICD-10-CM | POA: Diagnosis not present

## 2021-02-23 DIAGNOSIS — J449 Chronic obstructive pulmonary disease, unspecified: Secondary | ICD-10-CM | POA: Diagnosis not present

## 2021-02-23 DIAGNOSIS — Z0000 Encounter for general adult medical examination without abnormal findings: Secondary | ICD-10-CM | POA: Diagnosis not present

## 2021-02-23 DIAGNOSIS — M797 Fibromyalgia: Secondary | ICD-10-CM | POA: Diagnosis not present

## 2021-02-23 DIAGNOSIS — R82998 Other abnormal findings in urine: Secondary | ICD-10-CM | POA: Diagnosis not present

## 2021-02-23 DIAGNOSIS — I2510 Atherosclerotic heart disease of native coronary artery without angina pectoris: Secondary | ICD-10-CM | POA: Diagnosis not present

## 2021-03-01 DIAGNOSIS — D472 Monoclonal gammopathy: Secondary | ICD-10-CM | POA: Diagnosis not present

## 2021-03-15 ENCOUNTER — Ambulatory Visit (HOSPITAL_COMMUNITY): Payer: Medicare Other

## 2021-04-03 DIAGNOSIS — M810 Age-related osteoporosis without current pathological fracture: Secondary | ICD-10-CM | POA: Diagnosis not present

## 2021-04-13 DIAGNOSIS — J343 Hypertrophy of nasal turbinates: Secondary | ICD-10-CM | POA: Diagnosis not present

## 2021-04-13 DIAGNOSIS — J310 Chronic rhinitis: Secondary | ICD-10-CM | POA: Diagnosis not present

## 2021-04-13 DIAGNOSIS — J324 Chronic pansinusitis: Secondary | ICD-10-CM | POA: Diagnosis not present

## 2021-05-22 DIAGNOSIS — R5383 Other fatigue: Secondary | ICD-10-CM | POA: Diagnosis not present

## 2021-05-22 DIAGNOSIS — J0100 Acute maxillary sinusitis, unspecified: Secondary | ICD-10-CM | POA: Diagnosis not present

## 2021-05-22 DIAGNOSIS — Z1152 Encounter for screening for COVID-19: Secondary | ICD-10-CM | POA: Diagnosis not present

## 2021-05-22 DIAGNOSIS — R0981 Nasal congestion: Secondary | ICD-10-CM | POA: Diagnosis not present

## 2021-05-29 ENCOUNTER — Ambulatory Visit (INDEPENDENT_AMBULATORY_CARE_PROVIDER_SITE_OTHER): Payer: Medicare Other | Admitting: Nurse Practitioner

## 2021-05-29 ENCOUNTER — Ambulatory Visit (INDEPENDENT_AMBULATORY_CARE_PROVIDER_SITE_OTHER): Payer: Medicare Other

## 2021-05-29 ENCOUNTER — Encounter: Payer: Self-pay | Admitting: Nurse Practitioner

## 2021-05-29 VITALS — BP 134/78 | HR 73 | Ht 59.0 in | Wt 104.4 lb

## 2021-05-29 DIAGNOSIS — R059 Cough, unspecified: Secondary | ICD-10-CM | POA: Diagnosis not present

## 2021-05-29 DIAGNOSIS — J0110 Acute frontal sinusitis, unspecified: Secondary | ICD-10-CM

## 2021-05-29 DIAGNOSIS — J343 Hypertrophy of nasal turbinates: Secondary | ICD-10-CM | POA: Diagnosis not present

## 2021-05-29 DIAGNOSIS — J310 Chronic rhinitis: Secondary | ICD-10-CM | POA: Diagnosis not present

## 2021-05-29 DIAGNOSIS — J441 Chronic obstructive pulmonary disease with (acute) exacerbation: Secondary | ICD-10-CM | POA: Diagnosis not present

## 2021-05-29 DIAGNOSIS — R0602 Shortness of breath: Secondary | ICD-10-CM | POA: Diagnosis not present

## 2021-05-29 DIAGNOSIS — J324 Chronic pansinusitis: Secondary | ICD-10-CM | POA: Diagnosis not present

## 2021-05-29 DIAGNOSIS — Z87891 Personal history of nicotine dependence: Secondary | ICD-10-CM

## 2021-05-29 DIAGNOSIS — J449 Chronic obstructive pulmonary disease, unspecified: Secondary | ICD-10-CM | POA: Diagnosis not present

## 2021-05-29 MED ORDER — PREDNISONE 10 MG PO TABS: ORAL_TABLET | ORAL | 0 refills | Status: DC

## 2021-05-29 NOTE — Assessment & Plan Note (Signed)
40 pack year history. Quit >15 years ago so doesn't qualify for lung cancer screening program. Advised her that I would still recommend low dose CT for monitoring. We will plan to schedule at her next visit.  ?

## 2021-05-29 NOTE — Progress Notes (Signed)
? ?'@Patient'$  ID: Pamela Larson, female    DOB: 02/15/1945, 76 y.o.   MRN: [id number] ? ?Chief Complaint  ?Patient presents with  ? Follow-up  ?  SOB due to cold and thinks getting a sinus infection   ? ? ?Referring provider: ?Ginger Organ., MD ? ?HPI: ?76 year old female, former smoker (40 pack years) followed for COPD with chronic bronchitis and emphysema. Previously a patient of Dr. Mauricio Po and last seen in office on 03/22/2020 by Groce,NP. Past medical history significant for chronic rhinitis, GERD, hypothyroid, arthritis, OA, HNP cervical, CKD, chronic fatigue, hx of lyme disease, fibromyalgia. Followed by Dr. Neldon Mc for allergies and Dr. Benjamine Mola for chronic rhinitis.  ? ?TEST/EVENTS:  ?08/27/2017 HRCT chest: there is atherosclerosis present. No LAD noted. There is no evidence of ILD. There is a normal anatomical variant of azygos lobe. There is mild diffuse bronchial wall thickening with very mild paraseptal emphysema, most apparent in the lung apices. There is a chronic compression fx of L1 ?10/16/2017 PFTs: FVC 101, FEV1 110, 82, TLC 99, DLCOcor 81 ?12/26/2020 spirometry:  ? ?05/29/2021: Today - acute visit ?Patient presents today for acute visit. She reports that last weekend she began having some nasal congestion/increased drainage and cough. Felt like she was getting better but then started feeling worse on Tuesday last week. Seen by Dr. Benjamine Mola and treated with cefdinir and 6 days of prednisone. She has started to feel better but is concerned about her cough, which is productive but starting to clear up. She notes that her temperature was higher than her baseline when she first started antibiotics; T max 99.2. She has had a slight increase in DOE when compared to her baseline; was initially better on prednisone but worsened once she came off. Occasional wheeze. She denies any fevers greater than 100.4, chills, recent weight loss, lower extremity edema. She continues on Anoro. Rarely requires albuterol. She has  felt really well from a respiratory standpoint over the past year since we saw her last.  ? ?Allergies  ?Allergen Reactions  ? Amoxicillin Nausea And Vomiting  ?   ?Has patient had a PCN reaction causing immediate rash, facial/tongue/throat swelling, SOB or lightheadedness with hypotension: #  #  #  YES  #  #  #  ?Has patient had a PCN reaction causing severe rash involving mucus membranes or skin necrosis: No ?Has patient had a PCN reaction that required hospitalization No ?Has patient had a PCN reaction occurring within the last 10 years: #  #  #  YES  #  #  #  ?If all of the above answers are "NO", then may proceed with Cephalosporin use.  ? Augmentin [Amoxicillin-Pot Clavulanate] Nausea And Vomiting  ? Demerol [Meperidine Hcl] Nausea And Vomiting  ? Sulfa Antibiotics Nausea And Vomiting  ? ? ?Immunization History  ?Administered Date(s) Administered  ? Influenza Inj Mdck Quad Pf 12/26/2015  ? Influenza Split 05/26/2008, 06/05/2009, 11/16/2009, 12/07/2010, 11/20/2011, 12/25/2012, 01/01/2014  ? Influenza, High Dose Seasonal PF 01/01/2014, 11/17/2014, 12/07/2016, 11/19/2017, 12/05/2018  ? Influenza-Unspecified 12/07/2016, 11/16/2018  ? PFIZER(Purple Top)SARS-COV-2 Vaccination 03/19/2019, 04/13/2019, 10/12/2019  ? Pneumococcal Conjugate-13 07/07/2013  ? Pneumococcal Polysaccharide-23 05/26/2008, 06/05/2009, 06/29/2012, 10/22/2017  ? Td 05/26/2008, 06/05/2009  ? Tdap 05/19/2007, 11/01/2016  ? Zoster Recombinat (Shingrix) 12/26/2016, 06/12/2017  ? Zoster, Live 05/26/2008, 06/05/2009, 06/08/2010, 11/17/2014, 12/26/2016  ? ? ?Past Medical History:  ?Diagnosis Date  ? Anemia   ? pernicious anemia - 1991   ? Anxiety   ? Arthritis   ?  rheumatoid arthritis   ? Asthma   ? Chronic fatigue   ? Cystitis, interstitial   ? Depression   ? Disc disorder of lumbar region 2013  ? Fibromyalgia   ? GERD (gastroesophageal reflux disease)   ? History of kidney stones   ? Hypothyroidism   ? Lyme disease   ? hx of possible   ? Neuromuscular  disorder ([place])   ? 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 Osteoporosis   ? Pneumonia   ? hx of   ? Rheumatoid arthritis ([place])   ? ? ?Tobacco History: ?Social History  ? ?Tobacco Use  ?Smoking Status Former  ? Packs/day: 2.00  ? Years: 20.00  ? Pack years: 40.00  ? Types: Cigarettes  ? Quit date: 02/11/1990  ? Years since quitting: 31.3  ?Smokeless Tobacco Never  ? ?Counseling given: Not Answered ? ? ?Outpatient Medications Prior to Visit  ?Medication Sig Dispense Refill  ? acetaminophen (TYLENOL) 500 MG tablet Take 1,000 mg by mouth every 6 (six) hours as needed for mild pain. For pain    ? albuterol (VENTOLIN HFA) 108 (90 Base) MCG/ACT inhaler Inhale 2 puffs into the lungs every 4 (four) hours as needed for wheezing or shortness of breath. For shortness of breath 18 g 1  ? ALPRAZolam (XANAX) 0.25 MG tablet Take 0.125 mg by mouth daily as needed for anxiety. Pt takes 1/2 tablet as needed    ? calcium carbonate (OS-CAL - DOSED IN MG OF ELEMENTAL CALCIUM) 1250 MG tablet Take 1 tablet by mouth daily.    ? cefdinir (OMNICEF) 300 MG capsule Take 300 mg by mouth 2 (two) times daily.    ? famotidine (PEPCID) 40 MG tablet Take 1 tablet (40 mg total) by mouth at bedtime. 30 tablet 5  ? FLUoxetine (PROZAC) 20 MG tablet Take 20 mg by mouth 2 (two) times daily.    ? fluticasone (FLONASE) 50 MCG/ACT nasal spray Place 1 spray into both nostrils daily as needed for allergies or rhinitis. 16 g 5  ? ipratropium (ATROVENT) 0.03 % nasal spray Place 1 spray into both nostrils 2 (two) times daily. 30 mL 5  ? levothyroxine (SYNTHROID, LEVOTHROID) 100 MCG tablet Take 50-100 mcg by mouth See admin instructions. TAKES 100 MCG DAILY EXCEPT ON THURSDAYS AND TAKES 50 MCG    ? Melatonin 3 MG CAPS Take 3 mg by mouth at bedtime as needed (SLEEP).    ? montelukast (SINGULAIR) 10 MG tablet Take 1 tablet (10 mg total) by mouth at bedtime. 30 tablet 5  ? Olopatadine HCl 0.6 % SOLN Place 1 spray into the nose in the morning and at  bedtime. 30.5 g 5  ? omeprazole (PRILOSEC) 40 MG capsule Take 1 capsule (40 mg total) by mouth in the morning. TAKE 1 CAPSULE BY MOUTH EVERY MORNING 30 capsule 5  ? pseudoephedrine (SUDAFED) 30 MG tablet Take 30 mg by mouth every 4 (four) hours as needed for congestion.    ? rosuvastatin (CRESTOR) 10 MG tablet     ? umeclidinium-vilanterol (ANORO ELLIPTA) 62.5-25 MCG/ACT AEPB Inhale 1 puff into the lungs daily. 60 each 5  ? Vitamin D, Ergocalciferol, (DRISDOL) 50000 UNITS CAPS Take 50,000 Units by mouth every 7 (seven) days. On Mondays    ? Naproxen Sodium 220 MG CAPS Take by mouth.    ? ?No facility-administered medications prior to visit.  ? ? ? ?Review of Systems:  ? ?Constitutional: No weight loss or gain, night  sweats, fevers, chills, fatigue, or lassitude. ?HEENT: No headaches, difficulty swallowing, tooth/dental problems, or sore throat. No sneezing, itching, ear ache +nasal congestion/drainage (improving) ?CV:  No chest pain, orthopnea, PND, swelling in lower extremities, anasarca, dizziness, palpitations, syncope ?Resp: +shortness of breath with exertion; productive cough (improving); occasional wheeze. No excess mucus or change in color of mucus. No hemoptysis.   No chest wall deformity ?GI:  No heartburn, indigestion, abdominal pain, nausea, vomiting, diarrhea, change in bowel habits, loss of appetite, bloody stools.  ?Skin: No rash, lesions, ulcerations ?Neuro: No dizziness or lightheadedness.  ?Psych: No depression or anxiety. Mood stable.  ? ? ? ?Physical Exam: ? ?BP 134/78 (BP Location: Left Arm, Cuff Size: Normal)   Pulse 73   Ht '4\' 11"'$  (1.499 m)   Wt 104 lb 6.4 oz (47.4 kg)   SpO2 93%   BMI 21.09 kg/m?  ? ?GEN: Pleasant, interactive, well-nourished; in no acute distress. ?HEENT:  Normocephalic and atraumatic. PERRLA. Sclera white. Nasal turbinates erythematous, moist and patent bilaterally. Clear rhinorrhea present. Oropharynx erythematous and moist, without exudate or edema. No lesions,  ulcerations ?NECK:  Supple w/ fair ROM. No JVD present. Normal carotid impulses w/o bruits. Thyroid symmetrical with no goiter or nodules palpated. No lymphadenopathy.   ?CV: RRR, no m/r/g, no peripheral edema. Pulse

## 2021-05-29 NOTE — Assessment & Plan Note (Signed)
Advised continued supportive care measures. Continue cefdinir. She has follow up scheduled with Dr Benjamine Mola today for further evaluation.  ?

## 2021-05-29 NOTE — Patient Instructions (Addendum)
Continue Anoro 1 puff daily ?Continue Albuterol inhaler 2 puffs every 6 hours as needed for shortness of breath or wheezing. Notify if symptoms persist despite rescue inhaler/neb use. ?Continue famotidine 20 mg At bedtime  ?Continue flonase 1 spray each nostril daily as needed for allergies or nasal congestion ?Continue singulair 10 mg At bedtime  ?Continue omeprazole 40 mg daily  ?Continue Mucinex 600 mg Twice daily  ?Continue saline nasal sprays 2-3 times a day ?Continue atrovent 1 spray each nostril Twice daily  ? ?Continue Cefdinir as previously prescribed  ? ?Prednisone taper. 4 tabs for 3 days, then 3 tabs for 3 days, 2 tabs for 3 days, then 1 tab for 3 days, then stop. Take in AM with food.  ? ?Chest x ray today. We will notify you of any abnormal results.  ? ?Follow up in one month with Dr. Loanne Drilling. If symptoms do not improve or worsen, please contact office for sooner follow up or seek emergency care. ?

## 2021-05-29 NOTE — Assessment & Plan Note (Addendum)
AECOPD r/t URI/sinusitis. Slowly improving. DOE worsened after coming off prednisone burst. Prednisone taper. Complete cefdinir as previously prescribed. Continue on LAMA/LABA therapy with Anoro and PRN albuterol. CXR ordered today to rule out superimposed infection ? ?Patient Instructions  ?Continue Anoro 1 puff daily ?Continue Albuterol inhaler 2 puffs every 6 hours as needed for shortness of breath or wheezing. Notify if symptoms persist despite rescue inhaler/neb use. ?Continue famotidine 20 mg At bedtime  ?Continue flonase 1 spray each nostril daily as needed for allergies or nasal congestion ?Continue singulair 10 mg At bedtime  ?Continue omeprazole 40 mg daily  ?Continue Mucinex 600 mg Twice daily  ?Continue saline nasal sprays 2-3 times a day ?Continue atrovent 1 spray each nostril Twice daily  ? ?Continue Cefdinir as previously prescribed  ? ?Prednisone taper. 4 tabs for 3 days, then 3 tabs for 3 days, 2 tabs for 3 days, then 1 tab for 3 days, then stop. Take in AM with food.  ? ?Chest x ray today. We will notify you of any abnormal results.  ? ?Follow up in one month with Dr. Loanne Drilling. If symptoms do not improve or worsen, please contact office for sooner follow up or seek emergency care. ? ? ?

## 2021-05-30 ENCOUNTER — Telehealth: Payer: Self-pay | Admitting: Nurse Practitioner

## 2021-05-31 ENCOUNTER — Ambulatory Visit: Payer: Medicare Other | Admitting: Nurse Practitioner

## 2021-05-31 ENCOUNTER — Telehealth: Payer: Self-pay | Admitting: Nurse Practitioner

## 2021-05-31 MED ORDER — PREDNISONE 10 MG PO TABS: ORAL_TABLET | ORAL | 0 refills | Status: DC

## 2021-05-31 NOTE — Telephone Encounter (Signed)
Spoke with pt who had already reviewed results with Katie. Pt stated understanding. Nothing further needed at this time.  ?

## 2021-05-31 NOTE — Telephone Encounter (Signed)
Spoke with Rob and informed Pred taper order was edited. Rob stated they had already filled 20 of the tabs and would just provide the other 10 tabs to the pt. Nothing further needed at this time.  ?

## 2021-05-31 NOTE — Addendum Note (Signed)
Addended by: Gavin Potters R on: 05/31/2021 11:07 AM ? ? Modules accepted: Orders ? ?

## 2021-05-31 NOTE — Progress Notes (Signed)
Notified patient CXR stable with chronically hyperinflated lungs. No acute process identified.

## 2021-06-07 DIAGNOSIS — Z961 Presence of intraocular lens: Secondary | ICD-10-CM | POA: Diagnosis not present

## 2021-06-07 DIAGNOSIS — H26491 Other secondary cataract, right eye: Secondary | ICD-10-CM | POA: Diagnosis not present

## 2021-06-07 DIAGNOSIS — H43813 Vitreous degeneration, bilateral: Secondary | ICD-10-CM | POA: Diagnosis not present

## 2021-06-07 DIAGNOSIS — H52203 Unspecified astigmatism, bilateral: Secondary | ICD-10-CM | POA: Diagnosis not present

## 2021-06-12 ENCOUNTER — Ambulatory Visit: Payer: Medicare Other | Admitting: Nurse Practitioner

## 2021-06-26 ENCOUNTER — Ambulatory Visit (INDEPENDENT_AMBULATORY_CARE_PROVIDER_SITE_OTHER): Payer: Medicare Other | Admitting: Allergy and Immunology

## 2021-06-26 ENCOUNTER — Encounter: Payer: Self-pay | Admitting: Allergy and Immunology

## 2021-06-26 VITALS — BP 130/84 | HR 80 | Temp 98.3°F | Resp 16 | Ht 59.5 in | Wt 105.0 lb

## 2021-06-26 DIAGNOSIS — J3089 Other allergic rhinitis: Secondary | ICD-10-CM | POA: Diagnosis not present

## 2021-06-26 DIAGNOSIS — G500 Trigeminal neuralgia: Secondary | ICD-10-CM | POA: Diagnosis not present

## 2021-06-26 DIAGNOSIS — K219 Gastro-esophageal reflux disease without esophagitis: Secondary | ICD-10-CM | POA: Diagnosis not present

## 2021-06-26 DIAGNOSIS — J449 Chronic obstructive pulmonary disease, unspecified: Secondary | ICD-10-CM

## 2021-06-26 DIAGNOSIS — J4489 Other specified chronic obstructive pulmonary disease: Secondary | ICD-10-CM

## 2021-06-26 MED ORDER — OLOPATADINE HCL 0.6 % NA SOLN: 1.0000 | Freq: Two times a day (BID) | NASAL | 5 refills | Status: DC

## 2021-06-26 MED ORDER — FAMOTIDINE 40 MG PO TABS: 40.0000 mg | ORAL_TABLET | Freq: Every evening | ORAL | 5 refills | Status: DC

## 2021-06-26 MED ORDER — UMECLIDINIUM-VILANTEROL 62.5-25 MCG/ACT IN AEPB: 1.0000 | INHALATION_SPRAY | Freq: Every day | RESPIRATORY_TRACT | 5 refills | Status: DC

## 2021-06-26 MED ORDER — LEVOCETIRIZINE DIHYDROCHLORIDE 5 MG PO TABS: 5.0000 mg | ORAL_TABLET | Freq: Two times a day (BID) | ORAL | 5 refills | Status: DC | PRN

## 2021-06-26 MED ORDER — OMEPRAZOLE 40 MG PO CPDR: 40.0000 mg | DELAYED_RELEASE_CAPSULE | Freq: Every morning | ORAL | 5 refills | Status: DC

## 2021-06-26 MED ORDER — MONTELUKAST SODIUM 10 MG PO TABS
10.0000 mg | ORAL_TABLET | Freq: Every evening | ORAL | 5 refills | Status: DC
Start: 2021-06-26 — End: 2022-01-09

## 2021-06-26 MED ORDER — IPRATROPIUM BROMIDE 0.03 % NA SOLN: 2.0000 | Freq: Two times a day (BID) | NASAL | 5 refills | Status: DC

## 2021-06-26 MED ORDER — FLUTICASONE PROPIONATE 50 MCG/ACT NA SUSP: 1.0000 | Freq: Every day | NASAL | 5 refills | Status: DC | PRN

## 2021-06-26 NOTE — Patient Instructions (Signed)
?  1.  Continue to Treat and prevent inflammation: ? ? A.  Flonase 1 spray each nostril 2 times per day ? B.  Patanase - 1 spray each nostril 2 times per day ? C.  Ipratropium 0.03% -1-2 sprays each nostril 1-2 times a day   ? D.  montelukast 10 mg tablet 1 time per day ? E.  Anoro - 1 inhalation 1 time per day ? ?2.  Continue to Treat and prevent reflux: ? ? A.  Omeprazole 40 mg tablet in a.m. ? B.  Famotidine 40 mg mg tablet in p.m. ? ?3.  If needed: ? ? A.  OTC antihistamine. Mucinex, Sudafed ? B.  Nasal saline ? C.  Albuterol HFA - 2 inhalations every 4-6 hours ? ?4. Return to clinic in 6 months or earlier if problem.   ? ?  ?

## 2021-06-26 NOTE — Progress Notes (Signed)
? ?[facility] ? ? ?Follow-up Note ? ?Referring Provider: Ginger Larson., MD ?Primary Provider: Ginger Larson., MD ?Date of Office Visit: 06/26/2021 ? ?Subjective:  ? ?Pamela Larson (DOB: March 17, 1945) is a 76 y.o. female who returns to the [facility] on 06/26/2021 in re-evaluation of the following: ? ?HPI: [name] returns to this clinic in evaluation of emphysema with component of asthma, chronic rhinitis, LPR, and a history of left-sided facial pain syndrome.  Her last visit to this clinic was 26 December 2020. ? ?Overall she feels as though she is doing very well with her airway while using a combination of a nasal steroid, nasal antihistamine, and nasal ipratropium on a consistent basis.  She has apparently not required an antibiotic or systemic steroid other than when she contracted a "cold" in April 2023.  Apparently she was given cefdinir and prednisone by her primary care doctor and then subsequently saw the pulmonology group and her ENT and was treated with high-dose systemic steroids for 12 days with resolution of this issue. ? ?Rarely does she use a short acting bronchodilator. ? ?She had very little problems with her facial pain syndrome. ? ?Her reflux is under excellent control on her current plan of omeprazole and famotidine. ? ?Allergies as of 06/26/2021   ? ?   Reactions  ? Amoxicillin Nausea And Vomiting  ? Has patient had a PCN reaction causing immediate rash, facial/tongue/throat swelling, SOB or lightheadedness with hypotension: #  #  #  YES  #  #  #  ?Has patient had a PCN reaction causing severe rash involving mucus membranes or skin necrosis: No ?Has patient had a PCN reaction that required hospitalization No ?Has patient had a PCN reaction occurring within the last 10 years: #  #  #  YES  #  #  #  ?If all of the above answers are "NO", then may proceed with Cephalosporin use.  ? Augmentin [amoxicillin-pot Clavulanate]  Nausea And Vomiting  ? Demerol [meperidine Hcl] Nausea And Vomiting  ? Sulfa Antibiotics Nausea And Vomiting  ? ?  ? ?  ?Medication List  ? ? ?albuterol 108 (90 Base) MCG/ACT inhaler ?Commonly known as: VENTOLIN HFA ?Inhale 2 puffs into the lungs every 4 (four) hours as needed for wheezing or shortness of breath. For shortness of breath ?  ?ALPRAZolam 0.25 MG tablet ?Commonly known as: Duanne Moron ?Take 0.125 mg by mouth daily as needed for anxiety. Pt takes 1/2 tablet as needed ?  ?calcium carbonate 1250 (500 Ca) MG tablet ?Commonly known as: OS-CAL - dosed in mg of elemental calcium ?Take 1 tablet by mouth daily. ?  ?famotidine 40 MG tablet ?Commonly known as: PEPCID ?Take 1 tablet (40 mg total) by mouth at bedtime. ?  ?FLUoxetine 20 MG tablet ?Commonly known as: PROZAC ?Take 20 mg by mouth 2 (two) times daily. ?  ?fluticasone 50 MCG/ACT nasal spray ?Commonly known as: FLONASE ?Place 1 spray into both nostrils daily as needed for allergies or rhinitis. ?  ?ipratropium 0.03 % nasal spray ?Commonly known as: ATROVENT ?Place 1 spray into both nostrils 2 (two) times daily. ?  ?levothyroxine 100 MCG tablet ?Commonly known as: SYNTHROID ?Take 50-100 mcg by mouth See admin instructions. TAKES 100 MCG DAILY EXCEPT ON THURSDAYS AND TAKES 50 MCG ?  ?montelukast 10 MG tablet ?Commonly known as: SINGULAIR ?Take 1 tablet (10 mg total) by mouth at bedtime. ?  ?Naproxen Sodium  CanovaTake by mouth. ?  ?Olopatadine HCl 0.6 % Soln ?Place 1 spray into the nose in the morning and at bedtime. ?  ?omeprazole 40 MG capsule ?Commonly known as: PRILOSEC ?Take 1 capsule (40 mg total) by mouth in the morning. TAKE 1 CAPSULE BY MOUTH EVERY MORNING ?  ?pseudoephedrine 30 MG tablet ?Commonly known as: SUDAFED ?Take 30 mg by mouth every 4 (four) hours as needed for congestion. ?  ?rosuvastatin 10 MG tablet ?Commonly known as: CRESTOR ?  ?umeclidinium-vilanterol 62.5-25 MCG/ACT Aepb ?Commonly known as: Anoro Ellipta ?Inhale 1 puff into the  lungs daily. ?  ?Vitamin D (Ergocalciferol) 1.25 MG (50000 UNIT) Caps capsule ?Commonly known as: DRISDOL ?Take 50,000 Units by mouth every 7 (seven) days. On Mondays ?  ? ?Past Medical History:  ?Diagnosis Date  ? Anemia   ? pernicious anemia - 1991   ? Anxiety   ? Arthritis   ? rheumatoid arthritis   ? Asthma   ? Chronic fatigue   ? Cystitis, interstitial   ? Depression   ? Disc disorder of lumbar region 2013  ? Fibromyalgia   ? GERD (gastroesophageal reflux disease)   ? History of kidney stones   ? Hypothyroidism   ? Lyme disease   ? hx of possible   ? Neuromuscular disorder ([place])   ? 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 Osteoporosis   ? Pneumonia   ? hx of   ? Rheumatoid arthritis ([place])   ? ? ?Past Surgical History:  ?Procedure Laterality Date  ? APPENDECTOMY  1978  ? bladder hydrodistention  1998  ? BREAST SURGERY    ? reduction   ? CERVICAL DISC ARTHROPLASTY N/A 04/03/2016  ? Procedure: CERVICAL SIX CERVICAL SEVEN [place] ARTHROPLASTY;  Surgeon: Jovita Gamma, MD;  Location: [facility];  Service: Neurosurgery;  Laterality: N/A;  left side approach  ? COSMETIC SURGERY  2005  ? face lift  ? EXCISION MORTON'S NEUROMA Left 05/08/2017  ? Procedure: EXCISION MORTON'S NEUROMA Left 3rd webspace;  Surgeon: [name], MD;  Location: [facility];  Service: Orthopedics;  Laterality: Left;  ? EYE SURGERY    ? lower eye lids  ? LUMBAR LAMINECTOMY/DECOMPRESSION MICRODISCECTOMY  06/10/2011  ? Procedure: LUMBAR LAMINECTOMY/DECOMPRESSION MICRODISCECTOMY 1 LEVEL;  Surgeon: Hosie Spangle, MD;  Location: [facility];  Service: Neurosurgery;  Laterality: Left;  Lumbar Five Sacral One Lumbar Laminectomy Decompression Microdiscectomy   ? ? ?Review of systems negative except as noted in HPI / PMHx or noted below: ? ?Review of Systems  ?Constitutional: Negative.   ?HENT: Negative.    ?Eyes: Negative.   ?Respiratory: Negative.    ?Cardiovascular: Negative.   ?Gastrointestinal: Negative.   ?Genitourinary:  Negative.   ?Musculoskeletal: Negative.   ?Skin: Negative.   ?Neurological: Negative.   ?Endo/Heme/Allergies: Negative.   ?Psychiatric/Behavioral: Negative.    ? ? ?Objective:  ? ?Vitals:  ? 06/26/21 1347  ?BP: 130/84  ?Pulse: 80  ?Resp: 16  ?Temp: 98.3 ?F (36.8 ?C)  ?SpO2: 97%  ? ?Height: 4' 11.5" (151.1 cm)  ?Weight: 105 lb (47.6 kg)  ? ?Physical Exam ?Constitutional:   ?   Appearance: She is not diaphoretic.  ?HENT:  ?   Head: Normocephalic.  ?   Right Ear: Tympanic membrane, ear canal and external ear normal.  ?   Left Ear: Tympanic membrane, ear canal and external ear normal.  ?   Nose: Nose normal. No mucosal edema or rhinorrhea.  ?  Mouth/Throat:  ?   Pharynx: Uvula midline. No oropharyngeal exudate.  ?Eyes:  ?   Conjunctiva/sclera: Conjunctivae normal.  ?Neck:  ?   Thyroid: No thyromegaly.  ?   Trachea: Trachea normal. No tracheal tenderness or tracheal deviation.  ?Cardiovascular:  ?   Rate and Rhythm: Normal rate and regular rhythm.  ?   Heart sounds: Normal heart sounds, S1 normal and S2 normal. No murmur heard. ?Pulmonary:  ?   Effort: No respiratory distress.  ?   Breath sounds: Normal breath sounds. No stridor. No wheezing or rales.  ?Lymphadenopathy:  ?   Head:  ?   Right side of head: No tonsillar adenopathy.  ?   Left side of head: No tonsillar adenopathy.  ?   Cervical: No cervical adenopathy.  ?Skin: ?   Findings: No erythema or rash.  ?   Nails: There is no clubbing.  ?Neurological:  ?   Mental Status: She is alert.  ? ? ?Diagnostics:  ?  ?Spirometry was performed and demonstrated an FEV1 of 1.68 at 84 % of predicted. ? ?Assessment and Plan:  ? ?1. COPD with asthma ([place])   ?2. Perennial allergic rhinitis   ?3. LPRD (laryngopharyngeal reflux disease)   ?4. Facial pain syndrome   ? ? ?1.  Continue to Treat and prevent inflammation: ? ? A.  Flonase 1 spray each nostril 2 times per day ? B.  Patanase - 1 spray each nostril 2 times per day ? C.  Ipratropium 0.03% -1-2 sprays each nostril 1-2 times a  day   ? D.  montelukast 10 mg tablet 1 time per day ? E.  Anoro - 1 inhalation 1 time per day ? ?2.  Continue to Treat and prevent reflux: ? ? A.  Omeprazole 40 mg tablet in a.m. ? B.  Famotidine 40 mg mg tablet

## 2021-06-27 ENCOUNTER — Encounter: Payer: Self-pay | Admitting: Allergy and Immunology

## 2021-06-28 ENCOUNTER — Ambulatory Visit (INDEPENDENT_AMBULATORY_CARE_PROVIDER_SITE_OTHER): Payer: Medicare Other | Admitting: Pulmonary Disease

## 2021-06-28 ENCOUNTER — Encounter: Payer: Self-pay | Admitting: Pulmonary Disease

## 2021-06-28 VITALS — BP 118/70 | HR 83 | Temp 98.7°F | Ht 59.75 in | Wt 104.6 lb

## 2021-06-28 DIAGNOSIS — J449 Chronic obstructive pulmonary disease, unspecified: Secondary | ICD-10-CM | POA: Diagnosis not present

## 2021-06-28 NOTE — Patient Instructions (Addendum)
COPD with paraseptal emphysema --CONTINUE Anoro ONE puff ONCE a day --CONTINUE Albuterol AS NEEDED for shortness of breath or wheezing  Follow-up with me in 4 months

## 2021-06-28 NOTE — Progress Notes (Signed)
Subjective:   PATIENT ID: Pamela Larson Asher GENDER: female DOB: [birth year], MRN: [id number]   HPI  Chief Complaint  Patient presents with   Consult    She declines any shortness of breath, cough or wheezing.  She can take Cefdinir and prednisone in 4/23.      Reason for Visit: Follow-up   Ms. Pamela Larson is a 76 year old female with COPD with emphysema, allergic rhinitis, GERD, hypothyroidism, arthritis, OA, RA and hx chronic fatigue syndrome/lyme disease s/p treatment who presents as a new patient to me.  She was recently seen in April for acute visit for nasal congestion and cough that was treated by her ENT, Dr. Benjamine Mola with cefdinir and prednisone. Since then her cough and congestions has resolved. She states when she doesn't receive early treatment of sinus infections it will lead to respiratory symptoms but fortunately she has not had any significant respiratory symptoms since 2019. She is currently on Anoro. Denies shortness of breath, cough or wheezing.  Reviewed history per patient.  In 1991 she had a rash on her back. Had series of symptoms including fever and joint pain for many years. Was diagnosed with RA - seronegative. Also had chronic interstitial cystitis. Diagnosed with Lyme disease and after treatment doxycycline x 1 month with resolved RA and chronic interstitial cystitis. In 2015 had a significant episode of pneumonia and has had respiratory illnesses starting then. In 2019 she reports her worst year of lung infections occurring in April May June. After July no further infection after seeing Dr. Lake Bells and starting inhalers 2020 no lung infection 2021 August lung infection. Seen by Dr. Benjamine Mola for sinus 2022 no lung infection  Social History: Former smoker. 40 pack years. Quit >30 years ago.  I have personally reviewed patient's past medical/family/social history, allergies, current medications.  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    Osteoporosis    Pneumonia    hx of    Rheumatoid arthritis ([place])      Family History  Problem Relation Age of Onset   Breast cancer Mother    Colon cancer Mother        colorectal   Pancreatic cancer Mother    Rectal cancer Mother    Cancer Maternal Grandmother        abdominal   Lung cancer Other        maternal great aunt   Esophageal cancer Neg Hx    Stomach cancer Neg Hx      Social History   Occupational History   Occupation: retired Futures trader  Tobacco Use   Smoking status: Former    Packs/day: 2.00    Years: 20.00    Pack years: 40.00    Types: Cigarettes    Quit date: 02/11/1990    Years since quitting: 31.3   Smokeless tobacco: Never  Vaping Use   Vaping Use: Never used  Substance  and Sexual Activity   Alcohol use: Yes    Alcohol/week: 14.0 standard drinks    Types: 14 Standard drinks or equivalent per week    Comment: drink per nite scotch   Drug use: No   Sexual activity: Never    Partners: Male    Allergies  Allergen Reactions   Amoxicillin Nausea And Vomiting     Has patient had a PCN reaction causing immediate rash, facial/tongue/throat swelling, SOB or lightheadedness with hypotension: #  #  #  YES  #  #  #  Has patient had a PCN reaction causing severe rash involving mucus membranes or skin necrosis: No Has patient had a PCN reaction that required hospitalization No Has patient had a PCN reaction occurring within the last 10 years: #  #  #  YES  #  #  #  If all of the above answers are "NO", then may proceed with Cephalosporin use.    Augmentin [Amoxicillin-Pot Clavulanate] Nausea And Vomiting   Demerol [Meperidine Hcl] Nausea And Vomiting   Sulfa Antibiotics Nausea And Vomiting     Outpatient Medications Prior to Visit  Medication Sig Dispense Refill   acetaminophen (TYLENOL) 500 MG tablet Take 500 mg by mouth every 6 (six) hours as needed.     albuterol (VENTOLIN HFA) 108 (90 Base) MCG/ACT inhaler Inhale 2 puffs into the lungs every 4 (four) hours as needed for wheezing or shortness of breath. For shortness of breath 18 g 1   ALPRAZolam (XANAX) 0.25 MG tablet Take 0.125 mg by mouth daily as needed for anxiety. Pt takes 1/2 tablet as needed     calcium carbonate (OS-CAL - DOSED IN MG OF ELEMENTAL CALCIUM) 1250 MG tablet Take 1 tablet by mouth daily.     dextromethorphan-guaiFENesin (MUCINEX DM) 30-600 MG 12hr tablet Take 1 tablet by mouth 2 (two) times daily.     famotidine (PEPCID) 40 MG tablet Take 1 tablet (40 mg total) by mouth at bedtime. 30 tablet 5   FLUoxetine (PROZAC) 20 MG tablet Take 20 mg by mouth 2 (two) times daily.     fluticasone (FLONASE) 50 MCG/ACT nasal spray Place 1 spray into both nostrils daily as needed for allergies or rhinitis. 16 g 5   ipratropium (ATROVENT) 0.03 % nasal spray Place 2 sprays into both nostrils 2 (two) times daily. 30 mL 5   levocetirizine (XYZAL) 5 MG tablet Take 1 tablet (5 mg total) by mouth 2 (two) times daily as needed (Can take an extra dose during flare ups.). 60 tablet 5   levothyroxine (SYNTHROID, LEVOTHROID) 100 MCG tablet Take 50-100 mcg by mouth See admin instructions. TAKES 100 MCG DAILY EXCEPT ON THURSDAYS AND TAKES 50 MCG     loratadine (ALLERGY) 10 MG tablet Take 10 mg by mouth daily.     montelukast (SINGULAIR) 10 MG tablet Take 1 tablet (10 mg total) by mouth at bedtime. 30 tablet 5   Naproxen Sodium 220 MG CAPS Take by mouth.     Olopatadine HCl 0.6 % SOLN Place 1 spray into the nose in the morning and at bedtime. 30.5 g 5   omeprazole (PRILOSEC) 40 MG capsule  Take 1 capsule (40 mg total) by mouth in the morning. TAKE 1 CAPSULE BY MOUTH EVERY MORNING 30 capsule 5   pseudoephedrine (SUDAFED) 30 MG tablet Take 30 mg by mouth every 4 (four) hours as needed for congestion.     rosuvastatin (CRESTOR) 10 MG tablet      umeclidinium-vilanterol (  ANORO ELLIPTA) 62.5-25 MCG/ACT AEPB Inhale 1 puff into the lungs daily. 60 each 5   Vitamin D, Ergocalciferol, (DRISDOL) 50000 UNITS CAPS Take 50,000 Units by mouth every 7 (seven) days. On Mondays     No facility-administered medications prior to visit.    Review of Systems  Constitutional:  Negative for chills, diaphoresis, fever, malaise/fatigue and weight loss.  HENT:  Negative for congestion.   Respiratory:  Negative for cough, hemoptysis, sputum production, shortness of breath and wheezing.   Cardiovascular:  Negative for chest pain, palpitations and leg swelling.    Objective:   Vitals:   06/28/21 1418  BP: 118/70  Pulse: 83  Temp: 98.7 F (37.1 C)  TempSrc: Oral  SpO2: 98%  Weight: 104 lb 9.6 oz (47.4 kg)  Height: 4' 11.75" (1.518 m)   SpO2: 98 % O2 Device: None (Room air)  Physical Exam: General: Well-appearing, no acute distress HENT: [place], AT Eyes: EOMI, no scleral icterus Respiratory: Clear to auscultation bilaterally.  No crackles, wheezing or rales Cardiovascular: RRR, -M/R/G, no JVD Extremities:-Edema,-tenderness Neuro: AAO x4, CNII-XII grossly intact Psych: Normal mood, normal affect   Data Reviewed:  Imaging: CT Chest 08/27/17 - Mild paraseptal emphysema. CXR 06/28/21 - No acute infiltrate, effusion or edema  PFT: 10/16/17 FVC 2.55 (101%) FEV1 2.10 (110%) Ratio 80  TLC 99% DLCO 84% Interpretation: Normal PFTs  Spirometry 12/26/20 FVC 1.94 (85%) FEV1 (87%) Ratio 79% Labs: CBC    Component Value Date/Time   WBC 4.1 05/11/2019 1445   RBC 3.54 (L) 05/11/2019 1445   HGB 12.4 05/11/2019 1445   HGB 14.8 10/07/2017 1442   HCT 36.2 05/11/2019 1445   HCT 36.5 05/11/2019  1445   PLT 278.0 05/11/2019 1445   PLT 326 10/07/2017 1442   MCV 102.9 (H) 05/11/2019 1445   MCV 102 (H) 10/07/2017 1442   MCH 34.9 (H) 10/07/2017 1442   MCH 34.3 (H) 03/27/2016 1146   MCHC 34.0 05/11/2019 1445   RDW 13.4 05/11/2019 1445   RDW 13.5 10/07/2017 1442   LYMPHSABS 1.0 05/11/2019 1445   LYMPHSABS 1.1 10/07/2017 1442   MONOABS 0.5 05/11/2019 1445   EOSABS 0.1 05/11/2019 1445   EOSABS 0.0 10/07/2017 1442   BASOSABS 0.0 05/11/2019 1445   BASOSABS 0.0 10/07/2017 1442        Assessment & Plan:   Discussion: 76 year old female with COPD with emphysema, allergic rhinitis, GERD, hypothyroidism, arthritis, OA, RA and hx chronic fatigue syndrome/lyme disease s/p treatment who presents as a new patient to me. COPD is well controlled on LAMA/LABA. We spent >50% of the visit reviewing her prior history and personal documentation of the frequency of her respiratory infections since 2019.  COPD with paraseptal emphysema --CONTINUE Anoro ONE puff ONCE a day --CONTINUE Albuterol AS NEEDED for shortness of breath or wheezing  Chronic sinus congestion Reflux --CONTINUE singulair 10 mg daily --CONTINUE omeprazole 40 mg daily   Health Maintenance Immunization History  Administered Date(s) Administered   Influenza Inj Mdck Quad Pf 12/26/2015   Influenza Split 05/26/2008, 06/05/2009, 11/16/2009, 12/07/2010, 11/20/2011, 12/25/2012, 01/01/2014   Influenza, High Dose Seasonal PF 01/01/2014, 11/17/2014, 12/07/2016, 11/19/2017, 12/05/2018   Influenza-Unspecified 12/07/2016, 11/16/2018   PFIZER(Purple Top)SARS-COV-2 Vaccination 03/19/2019, 04/13/2019, 10/12/2019   Pneumococcal Conjugate-13 07/07/2013   Pneumococcal Polysaccharide-23 05/26/2008, 06/05/2009, 06/29/2012, 10/22/2017   Td 05/26/2008, 06/05/2009   Tdap 05/19/2007, 11/01/2016   Zoster Recombinat (Shingrix) 12/26/2016, 06/12/2017   Zoster, Live 05/26/2008, 06/05/2009, 06/08/2010, 11/17/2014, 12/26/2016   CT Lung Screen - not  qualified. Quit >15 years ago  No orders of the defined types were placed in this encounter. No orders of the defined types were placed in this encounter.   Return in about 4 months (around 10/29/2021).  I have spent a total time of 45-minutes on the day of the appointment reviewing prior documentation, coordinating care and discussing medical diagnosis and plan with the patient/family. Imaging, labs and tests included in this note have been reviewed and interpreted independently by me.  [place], MD [facility] 06/28/2021 2:37 PM  Office Number [phone]

## 2021-06-29 ENCOUNTER — Encounter: Payer: Self-pay | Admitting: Pulmonary Disease

## 2021-06-29 DIAGNOSIS — B349 Viral infection, unspecified: Secondary | ICD-10-CM | POA: Diagnosis not present

## 2021-06-29 DIAGNOSIS — J449 Chronic obstructive pulmonary disease, unspecified: Secondary | ICD-10-CM | POA: Diagnosis not present

## 2021-06-29 DIAGNOSIS — R0981 Nasal congestion: Secondary | ICD-10-CM | POA: Diagnosis not present

## 2021-06-29 DIAGNOSIS — M0600 Rheumatoid arthritis without rheumatoid factor, unspecified site: Secondary | ICD-10-CM | POA: Diagnosis not present

## 2021-06-29 DIAGNOSIS — R051 Acute cough: Secondary | ICD-10-CM | POA: Diagnosis not present

## 2021-06-29 DIAGNOSIS — J029 Acute pharyngitis, unspecified: Secondary | ICD-10-CM | POA: Diagnosis not present

## 2021-07-05 DIAGNOSIS — D510 Vitamin B12 deficiency anemia due to intrinsic factor deficiency: Secondary | ICD-10-CM | POA: Diagnosis not present

## 2021-07-06 NOTE — Addendum Note (Signed)
Addended by: Herbie Drape on: 07/06/2021 06:22 PM   Modules accepted: Orders

## 2021-07-10 ENCOUNTER — Ambulatory Visit: Payer: Medicare Other | Admitting: Allergy and Immunology

## 2021-07-10 ENCOUNTER — Encounter: Payer: Self-pay | Admitting: Allergy and Immunology

## 2021-07-10 VITALS — BP 118/62 | HR 78 | Temp 97.9°F | Resp 16 | Ht 59.0 in | Wt 107.0 lb

## 2021-07-10 DIAGNOSIS — Z658 Other specified problems related to psychosocial circumstances: Secondary | ICD-10-CM

## 2021-07-10 DIAGNOSIS — J449 Chronic obstructive pulmonary disease, unspecified: Secondary | ICD-10-CM

## 2021-07-10 NOTE — Progress Notes (Unsigned)
Pamela Larson presents to this clinic to discuss she has fears about her daughter, Pamela Larson, who is also a patient in this clinic and has given permission for Terrah to discuss her medical care.  I will note the details of our conversation in her daughter's electronic medical chart.

## 2021-07-11 ENCOUNTER — Ambulatory Visit (INDEPENDENT_AMBULATORY_CARE_PROVIDER_SITE_OTHER): Payer: Medicare Other | Admitting: Nurse Practitioner

## 2021-07-11 ENCOUNTER — Encounter: Payer: Self-pay | Admitting: Nurse Practitioner

## 2021-07-11 DIAGNOSIS — J431 Panlobular emphysema: Secondary | ICD-10-CM | POA: Diagnosis not present

## 2021-07-11 DIAGNOSIS — J040 Acute laryngitis: Secondary | ICD-10-CM | POA: Diagnosis not present

## 2021-07-11 DIAGNOSIS — J069 Acute upper respiratory infection, unspecified: Secondary | ICD-10-CM

## 2021-07-11 NOTE — Assessment & Plan Note (Signed)
Breathing overall stable. Appears compensated on current regimen. Continue Anoro and PRN albuterol. Advised her to notify if she develops new or worsening respiratory symptoms.   Patient Instructions  Continue Anoro 1 puff daily Continue Albuterol inhaler 2 puffs every 6 hours as needed for shortness of breath or wheezing. Notify if symptoms persist despite rescue inhaler/neb use. Continue famotidine 20 mg At bedtime  Continue flonase 1 spray each nostril daily as needed for allergies or nasal congestion Continue singulair 10 mg At bedtime  Continue omeprazole 40 mg daily  Continue Mucinex 600 mg Twice daily  Continue saline nasal sprays 2-3 times a day Continue atrovent 1 spray each nostril Twice daily   Delsym 2 tsp Twice daily as needed for cough Suck on sugar free hard candies throughout the day to keep mouth moist. Frequent sips of water, especially when you get a tickle in the back of your throat.  Avoid throat clearing as much as possible.  Hot tea with lemon and honey.  Voice rest over the next few days.   Follow up as scheduled with Dr. Loanne Drilling 9/20. If symptoms do not improve or worsen, please contact office for sooner follow up or seek emergency care.

## 2021-07-11 NOTE — Patient Instructions (Addendum)
Continue Anoro 1 puff daily Continue Albuterol inhaler 2 puffs every 6 hours as needed for shortness of breath or wheezing. Notify if symptoms persist despite rescue inhaler/neb use. Continue famotidine 20 mg At bedtime  Continue flonase 1 spray each nostril daily as needed for allergies or nasal congestion Continue singulair 10 mg At bedtime  Continue omeprazole 40 mg daily  Continue Mucinex 600 mg Twice daily  Continue saline nasal sprays 2-3 times a day Continue atrovent 1 spray each nostril Twice daily   Delsym 2 tsp Twice daily as needed for cough Suck on sugar free hard candies throughout the day to keep mouth moist. Frequent sips of water, especially when you get a tickle in the back of your throat.  Avoid throat clearing as much as possible.  Hot tea with lemon and honey.  Voice rest over the next few days.   Follow up as scheduled with Dr. Loanne Drilling 9/20. If symptoms do not improve or worsen, please contact office for sooner follow up or seek emergency care.

## 2021-07-11 NOTE — Assessment & Plan Note (Signed)
See above plan. 

## 2021-07-11 NOTE — Progress Notes (Addendum)
$'@Patient'a$  ID: Pamela Larson, female    DOB: Dec 22, 1945, 76 y.o.   MRN: [id number]  Chief Complaint  Patient presents with   Follow-up    Patients had laryngitis for a week     Referring provider: Ginger Larson., MD  HPI: 76 year old female, former smoker (40 pack years) followed for COPD with chronic bronchitis and emphysema. Patient of Dr. Cordelia Larson and last seen in office on 06/28/2021. Past medical history significant for chronic rhinitis, GERD, hypothyroid, arthritis, OA, HNP cervical, CKD, chronic fatigue, hx of lyme disease, fibromyalgia. Followed by Dr. Neldon Larson for allergies and Dr. Benjamine Larson for chronic rhinitis.   TEST/EVENTS:  08/27/2017 HRCT chest: there is atherosclerosis present. No LAD noted. There is no evidence of ILD. There is a normal anatomical variant of azygos lobe. There is mild diffuse bronchial wall thickening with very mild paraseptal emphysema, most apparent in the lung apices. There is a chronic compression fx of L1 10/16/2017 PFTs: FVC 101, FEV1 110, 82, TLC 99, DLCOcor 81 12/26/2020 spirometry:   05/29/2021: OV with Pamela Guyton NP for acute visit. She reports that last weekend she began having some nasal congestion/increased drainage and cough. Felt like she was getting better but then started feeling worse on Tuesday last week. Seen by Dr. Benjamine Larson and treated with cefdinir and 6 days of prednisone. She has started to feel better but is concerned about her cough, which is productive but starting to clear up. She notes that her temperature was higher than her baseline when she first started antibiotics; T max 99.2. She has had a slight increase in DOE when compared to her baseline; was initially better on prednisone but worsened once she came off. Occasional wheeze. She denies any fevers greater than 100.4, chills, recent weight loss, lower extremity edema. AECOPD r/t URI/sinusitis. Slowly improving. DOE worsened after coming off prednisone burst. Prednisone taper. Complete  cefdinir as previously prescribed. Continue on LAMA/LABA therapy with Anoro and PRN albuterol. CXR without superimposed infection. Requested to transition care to Pamela Larson.  06/28/2021: OV with Pamela Larson to establish care and follow up. Reported feeling better with no respiratory complaints. No changes to regimen. Advised return in 4 months.   07/11/2021: Today - acute Patient presents today for hoarseness over the last week. She was driving her grandkids to and from school and then developed cold like symptoms last week with nasal congestion and postnasal drainage. She also had some increased shortness of breath and occasional dry cough. She then developed hoarseness earlier this week. She does feel like, aside from her hoarseness, she has improved but just wanted to get checked out today to make sure her lungs sounded okay. She denies any wheezing, fevers, chest congestion, headaches. She continues on Anoro. Has not had to use her rescue. She is on three different nasal sprays prescribed by ENT.   Allergies  Allergen Reactions   Amoxicillin Nausea And Vomiting     Has patient had a PCN reaction causing immediate rash, facial/tongue/throat swelling, SOB or lightheadedness with hypotension: #  #  #  YES  #  #  #  Has patient had a PCN reaction causing severe rash involving mucus membranes or skin necrosis: No Has patient had a PCN reaction that required hospitalization No Has patient had a PCN reaction occurring within the last 10 years: #  #  #  YES  #  #  #  If all of the above answers are "NO", then may proceed with Cephalosporin  use.   Augmentin [Amoxicillin-Pot Clavulanate] Nausea And Vomiting   Demerol [Meperidine Hcl] Nausea And Vomiting   Sulfa Antibiotics Nausea And Vomiting    Immunization History  Administered Date(s) Administered   Influenza Inj Mdck Quad Pf 12/26/2015   Influenza Split 05/26/2008, 06/05/2009, 11/16/2009, 12/07/2010, 11/20/2011, 12/25/2012, 01/01/2014    Influenza, High Dose Seasonal PF 01/01/2014, 11/17/2014, 12/07/2016, 11/19/2017, 12/05/2018   Influenza-Unspecified 12/07/2016, 11/16/2018   PFIZER(Purple Top)SARS-COV-2 Vaccination 03/19/2019, 04/13/2019, 10/12/2019   Pneumococcal Conjugate-13 07/07/2013   Pneumococcal Polysaccharide-23 05/26/2008, 06/05/2009, 06/29/2012, 10/22/2017   Td 05/26/2008, 06/05/2009   Tdap 05/19/2007, 11/01/2016   Zoster Recombinat (Shingrix) 12/26/2016, 06/12/2017   Zoster, Live 05/26/2008, 06/05/2009, 06/08/2010, 11/17/2014, 12/26/2016    Past Medical History:  Diagnosis Date   Anemia    pernicious anemia - 1991    Anxiety    Arthritis    rheumatoid arthritis    Asthma    Chronic fatigue    Cystitis, interstitial    Depression    Disc disorder of lumbar region 2013   Fibromyalgia    GERD (gastroesophageal reflux disease)    History of kidney stones    Hypothyroidism    Lyme disease    hx of possible    Neuromuscular disorder (HCC)    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     albuterol (VENTOLIN HFA) 108 (90 Base) MCG/ACT inhaler Inhale 2 puffs into the lungs every 4 (four) hours as needed for wheezing or shortness of breath. For shortness of breath 18 g 1   ALPRAZolam (XANAX) 0.25 MG tablet Take 0.125 mg by mouth daily as needed for anxiety. Pt takes 1/2 tablet as needed     calcium carbonate (OS-CAL - DOSED IN MG OF ELEMENTAL CALCIUM) 1250 MG tablet Take 1 tablet by mouth daily.      dextromethorphan-guaiFENesin (MUCINEX DM) 30-600 MG 12hr tablet Take 1 tablet by mouth 2 (two) times daily.     famotidine (PEPCID) 40 MG tablet Take 1 tablet (40 mg total) by mouth at bedtime. 30 tablet 5   FLUoxetine (PROZAC) 20 MG tablet Take 20 mg by mouth 2 (two) times daily.     fluticasone (FLONASE) 50 MCG/ACT nasal spray Place 1 spray into both nostrils daily as needed for allergies or rhinitis. 16 g 5   ipratropium (ATROVENT) 0.03 % nasal spray Place 2 sprays into both nostrils 2 (two) times daily. 30 mL 5   levocetirizine (XYZAL) 5 MG tablet Take 1 tablet (5 mg total) by mouth 2 (two) times daily as needed (Can take an extra dose during flare ups.). 60 tablet 5   levothyroxine (SYNTHROID, LEVOTHROID) 100 MCG tablet Take 50-100 mcg by mouth See admin instructions. TAKES 100 MCG DAILY EXCEPT ON THURSDAYS AND TAKES 50 MCG     montelukast (SINGULAIR) 10 MG tablet Take 1 tablet (10 mg total) by mouth at bedtime. 30 tablet 5  Naproxen Sodium 220 MG CAPS Take by mouth.     Olopatadine HCl 0.6 % SOLN Place 1 spray into the nose in the morning and at bedtime. 30.5 g 5   omeprazole (PRILOSEC) 40 MG capsule Take 1 capsule (40 mg total) by mouth in the morning. TAKE 1 CAPSULE BY MOUTH EVERY MORNING 30 capsule 5   pseudoephedrine (SUDAFED) 30 MG tablet Take 30 mg by mouth every 4 (four) hours as needed for congestion.     rosuvastatin (CRESTOR) 10 MG tablet      umeclidinium-vilanterol (ANORO ELLIPTA) 62.5-25 MCG/ACT AEPB Inhale 1 puff into the lungs daily. 60 each 5   Vitamin D, Ergocalciferol, (DRISDOL) 50000 UNITS CAPS Take 50,000 Units by mouth every 7 (seven) days. On Mondays     loratadine (CLARITIN) 10 MG tablet Take 10 mg by mouth daily. (Patient not taking: Reported on 07/11/2021)     No facility-administered medications prior to visit.     Review of Systems:   Constitutional: No weight loss or gain, night sweats, fevers, chills, fatigue, or lassitude. HEENT: No headaches, difficulty  swallowing, tooth/dental problems, or sore throat. No sneezing, itching, ear ache +nasal congestion/drainage (improving), hoarseness CV:  No chest pain, orthopnea, PND, swelling in lower extremities, anasarca, dizziness, palpitations, syncope Resp: +shortness of breath with exertion (improved); minimal dry cough (improving). No excess mucus or change in color of mucus. No hemoptysis.  No wheeze.  No chest wall deformity GI:  No heartburn, indigestion, abdominal pain, nausea, vomiting, diarrhea, change in bowel habits, loss of appetite, bloody stools.  Skin: No rash, lesions, ulcerations Neuro: No dizziness or lightheadedness.  Psych: No depression or anxiety. Mood stable.     Physical Exam:  BP 134/70 (BP Location: Right Arm, Patient Position: Sitting, Cuff Size: Normal)   Pulse 98   Temp 98.9 F (37.2 C) (Oral)   Ht 5' (1.524 m)   Wt 105 lb (47.6 kg)   SpO2 99%   BMI 20.51 kg/m   GEN: Pleasant, interactive, well-nourished; in no acute distress. HEENT:  Normocephalic and atraumatic. PERRLA. Sclera white. Nasal turbinates erythematous, moist and patent bilaterally. No rhinorrhea present. Oropharynx erythematous and moist, without exudate or edema. No lesions, ulcerations NECK:  Supple w/ fair ROM. No JVD present. Normal carotid impulses w/o bruits. Thyroid symmetrical with no goiter or nodules palpated. No lymphadenopathy.   CV: RRR, no m/r/g, no peripheral edema. Pulses intact, +2 bilaterally. No cyanosis, pallor or clubbing. PULMONARY:  Unlabored, regular breathing. Clear bilaterally A&P w/o wheezes/rhonchi/rales. No accessory muscle use. No dullness to percussion. GI: BS present and normoactive. Soft, non-tender to palpation. No organomegaly or masses detected. No CVA tenderness. Neuro: A/Ox3. No focal deficits noted.   Skin: Warm, no lesions or rashe Psych: Normal affect and behavior. Judgement and thought content appropriate.     Lab Results:  CBC    Component Value  Date/Time   WBC 4.1 05/11/2019 1445   RBC 3.54 (L) 05/11/2019 1445   HGB 12.4 05/11/2019 1445   HGB 14.8 10/07/2017 1442   HCT 36.2 05/11/2019 1445   HCT 36.5 05/11/2019 1445   PLT 278.0 05/11/2019 1445   PLT 326 10/07/2017 1442   MCV 102.9 (H) 05/11/2019 1445   MCV 102 (H) 10/07/2017 1442   MCH 34.9 (H) 10/07/2017 1442   MCH 34.3 (H) 03/27/2016 1146   MCHC 34.0 05/11/2019 1445   RDW 13.4 05/11/2019 1445   RDW 13.5 10/07/2017 1442   LYMPHSABS 1.0 05/11/2019 1445   LYMPHSABS 1.1 10/07/2017  1442   MONOABS 0.5 05/11/2019 1445   EOSABS 0.1 05/11/2019 1445   EOSABS 0.0 10/07/2017 1442   BASOSABS 0.0 05/11/2019 1445   BASOSABS 0.0 10/07/2017 1442    BMET    Component Value Date/Time   NA 134 (L) 05/11/2019 1445   K 4.4 05/11/2019 1445   CL 98 05/11/2019 1445   CO2 27 05/11/2019 1445   GLUCOSE 91 05/11/2019 1445   BUN 15 05/11/2019 1445   CREATININE 0.82 05/11/2019 1445   CALCIUM 9.3 05/11/2019 1445   GFRNONAA >60 03/27/2016 1146   GFRAA >60 03/27/2016 1146    BNP No results found for: BNP   Imaging:  No results found.       Latest Ref Rng & Units 10/16/2017    1:55 PM  PFT Results  FVC-Pre L 2.60    FVC-Predicted Pre % 103    FVC-Post L 2.55    FVC-Predicted Post % 101    Pre FEV1/FVC % % 80    Post FEV1/FCV % % 82    FEV1-Pre L 2.08    FEV1-Predicted Pre % 110    FEV1-Post L 2.10    DLCO uncorrected ml/min/mmHg 15.92    DLCO UNC% % 84    DLCO corrected ml/min/mmHg 15.30    DLCO COR %Predicted % 81    DLVA Predicted % 91    TLC L 4.43    TLC % Predicted % 99    RV % Predicted % 85      No results found for: NITRICOXIDE      Assessment & Plan:   COPD (chronic obstructive pulmonary disease) (HCC) Breathing overall stable. Appears compensated on current regimen. Continue Anoro and PRN albuterol. Advised her to notify if she develops new or worsening respiratory symptoms.   Patient Instructions  Continue Anoro 1 puff daily Continue Albuterol  inhaler 2 puffs every 6 hours as needed for shortness of breath or wheezing. Notify if symptoms persist despite rescue inhaler/neb use. Continue famotidine 20 mg At bedtime  Continue flonase 1 spray each nostril daily as needed for allergies or nasal congestion Continue singulair 10 mg At bedtime  Continue omeprazole 40 mg daily  Continue Mucinex 600 mg Twice daily  Continue saline nasal sprays 2-3 times a day Continue atrovent 1 spray each nostril Twice daily   Delsym 2 tsp Twice daily as needed for cough Suck on sugar free hard candies throughout the day to keep mouth moist. Frequent sips of water, especially when you get a tickle in the back of your throat.  Avoid throat clearing as much as possible.  Hot tea with lemon and honey.  Voice rest over the next few days.   Follow up as scheduled with Pamela Larson 9/20. If symptoms do not improve or worsen, please contact office for sooner follow up or seek emergency care.    Laryngitis Laryngitis r/t postnasal drip from URI. Clinically improving. Advised supportive care measures.   Upper respiratory infection See above plan.   I spent 25 minutes of dedicated to the care of this patient on the date of this encounter to include pre-visit review of records, face-to-face time with the patient discussing conditions above, post visit ordering of testing, clinical documentation with the electronic health record, making appropriate referrals as documented, and communicating necessary findings to members of the patients care team.  Clayton Bibles, NP 07/11/2021  Pt aware and understands NP's role.

## 2021-07-11 NOTE — Assessment & Plan Note (Signed)
Laryngitis r/t postnasal drip from URI. Clinically improving. Advised supportive care measures.

## 2021-07-13 ENCOUNTER — Telehealth: Payer: Self-pay | Admitting: Nurse Practitioner

## 2021-07-13 MED ORDER — PREDNISONE 20 MG PO TABS: 40.0000 mg | ORAL_TABLET | Freq: Every day | ORAL | 0 refills | Status: DC

## 2021-07-13 NOTE — Telephone Encounter (Signed)
Please send prednisone burst 40 mg daily for 5 days for laryngitis. Take in AM with food. Advise she continue with other measures that we discussed at [place].  Suck on sugar free hard candies throughout the day to keep mouth moist. Frequent sips of water, especially when you get a tickle in the back of your throat.  Avoid throat clearing as much as possible.  Hot tea with lemon and honey.  Voice rest over the next few days.   Thanks!

## 2021-07-13 NOTE — Telephone Encounter (Signed)
I called and spoke with the pt and notified of response per Joellen Jersey  She verbalized understanding  Rx for pred sent to preferred pharm  Nothing further needed

## 2021-07-13 NOTE — Telephone Encounter (Signed)
Spoke with the pt  She was seen 07/11/21 by Joellen Jersey  She states felt better "for a day" and then now worse again  She states overall she does feel better since ov "but not a lot"  She is taking all meds as prescribed at ov  She is c/o laryngitis and fatigue  She gets winded walking up and down stairs  Albuterol has helped some  She states that she is resting her voice "as I can"  Her voice did not sound hoarse while talking with her on the phone and she states that "it's strained" She is already scheduled to see Katie on 06/16/21 but would like to know what to do over the weekend  Please advise, thanks!  Allergies  Allergen Reactions   Amoxicillin Nausea And Vomiting     Has patient had a PCN reaction causing immediate rash, facial/tongue/throat swelling, SOB or lightheadedness with hypotension: #  #  #  YES  #  #  #  Has patient had a PCN reaction causing severe rash involving mucus membranes or skin necrosis: No Has patient had a PCN reaction that required hospitalization No Has patient had a PCN reaction occurring within the last 10 years: #  #  #  YES  #  #  #  If all of the above answers are "NO", then may proceed with Cephalosporin use.   Augmentin [Amoxicillin-Pot Clavulanate] Nausea And Vomiting   Demerol [Meperidine Hcl] Nausea And Vomiting   Sulfa Antibiotics Nausea And Vomiting

## 2021-07-16 ENCOUNTER — Ambulatory Visit: Payer: Medicare Other | Admitting: Nurse Practitioner

## 2021-07-17 ENCOUNTER — Encounter (HOSPITAL_BASED_OUTPATIENT_CLINIC_OR_DEPARTMENT_OTHER): Payer: Self-pay | Admitting: Obstetrics and Gynecology

## 2021-07-17 ENCOUNTER — Other Ambulatory Visit: Payer: Self-pay

## 2021-07-17 ENCOUNTER — Emergency Department (HOSPITAL_BASED_OUTPATIENT_CLINIC_OR_DEPARTMENT_OTHER): Payer: Medicare Other

## 2021-07-17 ENCOUNTER — Emergency Department (HOSPITAL_BASED_OUTPATIENT_CLINIC_OR_DEPARTMENT_OTHER)
Admission: EM | Admit: 2021-07-17 | Discharge: 2021-07-17 | Disposition: A | Discharge status: Home / Self Care | Payer: Medicare Other | Attending: Emergency Medicine | Admitting: Emergency Medicine

## 2021-07-17 ENCOUNTER — Emergency Department (HOSPITAL_BASED_OUTPATIENT_CLINIC_OR_DEPARTMENT_OTHER): Payer: Medicare Other | Admitting: Radiology

## 2021-07-17 DIAGNOSIS — S63613A Unspecified sprain of left middle finger, initial encounter: Secondary | ICD-10-CM | POA: Insufficient documentation

## 2021-07-17 DIAGNOSIS — W19XXXA Unspecified fall, initial encounter: Secondary | ICD-10-CM

## 2021-07-17 DIAGNOSIS — R109 Unspecified abdominal pain: Secondary | ICD-10-CM | POA: Diagnosis not present

## 2021-07-17 DIAGNOSIS — S299XXA Unspecified injury of thorax, initial encounter: Secondary | ICD-10-CM | POA: Diagnosis not present

## 2021-07-17 DIAGNOSIS — S3993XA Unspecified injury of pelvis, initial encounter: Secondary | ICD-10-CM | POA: Diagnosis not present

## 2021-07-17 DIAGNOSIS — S22049A Unspecified fracture of fourth thoracic vertebra, initial encounter for closed fracture: Secondary | ICD-10-CM | POA: Diagnosis not present

## 2021-07-17 DIAGNOSIS — M19032 Primary osteoarthritis, left wrist: Secondary | ICD-10-CM | POA: Diagnosis not present

## 2021-07-17 DIAGNOSIS — M546 Pain in thoracic spine: Secondary | ICD-10-CM | POA: Diagnosis not present

## 2021-07-17 DIAGNOSIS — S22079A Unspecified fracture of T9-T10 vertebra, initial encounter for closed fracture: Secondary | ICD-10-CM | POA: Insufficient documentation

## 2021-07-17 DIAGNOSIS — S22000A Wedge compression fracture of unspecified thoracic vertebra, initial encounter for closed fracture: Secondary | ICD-10-CM

## 2021-07-17 DIAGNOSIS — W010XXA Fall on same level from slipping, tripping and stumbling without subsequent striking against object, initial encounter: Secondary | ICD-10-CM | POA: Diagnosis not present

## 2021-07-17 DIAGNOSIS — S3991XA Unspecified injury of abdomen, initial encounter: Secondary | ICD-10-CM | POA: Diagnosis not present

## 2021-07-17 DIAGNOSIS — Z043 Encounter for examination and observation following other accident: Secondary | ICD-10-CM | POA: Diagnosis not present

## 2021-07-17 LAB — BASIC METABOLIC PANEL
Anion gap: 10 (ref 5–15)
BUN: 13 mg/dL (ref 8–23)
CO2: 24 mmol/L (ref 22–32)
Calcium: 9.3 mg/dL (ref 8.9–10.3)
Chloride: 103 mmol/L (ref 98–111)
Creatinine, Ser: 0.67 mg/dL (ref 0.44–1.00)
GFR, Estimated: >60 mL/min (ref >60)
Glucose, Bld: 91 mg/dL (ref 70–99)
Potassium: 3.9 mmol/L (ref 3.5–5.1)
Sodium: 137 mmol/L (ref 135–145)

## 2021-07-17 LAB — CBC WITH DIFFERENTIAL/PLATELET
Abs Immature Granulocytes: 0.02 10*3/uL (ref 0.00–0.07)
Basophils Absolute: 0.1 10*3/uL (ref 0.0–0.1)
Basophils Relative: 1 %
Eosinophils Absolute: 0.1 10*3/uL (ref 0.0–0.5)
Eosinophils Relative: 1 %
HCT: 36.9 % (ref 36.0–46.0)
Hemoglobin: 11.9 g/dL — ABNORMAL LOW (ref 12.0–15.0)
Immature Granulocytes: 0 %
Lymphocytes Relative: 15 %
Lymphs Abs: 1 10*3/uL (ref 0.7–4.0)
MCH: 33.4 pg (ref 26.0–34.0)
MCHC: 32.2 g/dL (ref 30.0–36.0)
MCV: 103.7 fL — ABNORMAL HIGH (ref 80.0–100.0)
Monocytes Absolute: 0.6 10*3/uL (ref 0.1–1.0)
Monocytes Relative: 9 %
Neutro Abs: 5.1 10*3/uL (ref 1.7–7.7)
Neutrophils Relative %: 74 %
Platelets: 256 10*3/uL (ref 150–400)
RBC: 3.56 MIL/uL — ABNORMAL LOW (ref 3.87–5.11)
RDW: 13.3 % (ref 11.5–15.5)
WBC: 6.8 10*3/uL (ref 4.0–10.5)
nRBC: 0 % (ref 0.0–0.2)

## 2021-07-17 MED ORDER — HYDROCODONE-ACETAMINOPHEN 5-325 MG PO TABS
1.0000 | ORAL_TABLET | Freq: Once | ORAL | Status: AC
  Administered 2021-07-17: 1 via ORAL
  Filled 2021-07-17: qty 1

## 2021-07-17 MED ORDER — IOHEXOL 300 MG/ML  SOLN
100.0000 mL | Freq: Once | INTRAMUSCULAR | Status: AC | PRN
Start: 2021-07-17 — End: 2021-07-17
  Administered 2021-07-17: 75 mL via INTRAVENOUS

## 2021-07-17 MED ORDER — HYDROCODONE-ACETAMINOPHEN 5-325 MG PO TABS: 1.0000 | ORAL_TABLET | Freq: Four times a day (QID) | ORAL | 0 refills | Status: DC | PRN

## 2021-07-17 MED ORDER — HYDROMORPHONE HCL 1 MG/ML IJ SOLN
0.5000 mg | Freq: Once | INTRAMUSCULAR | Status: AC
  Administered 2021-07-17: 0.5 mg via INTRAVENOUS
  Filled 2021-07-17: qty 1

## 2021-07-17 MED ORDER — SODIUM CHLORIDE 0.9 % IV BOLUS
500.0000 mL | Freq: Once | INTRAVENOUS | Status: AC
  Administered 2021-07-17: 500 mL via INTRAVENOUS

## 2021-07-17 NOTE — Discharge Instructions (Addendum)
1.  You may take 1-2 Vicodin every 6 hours for pain as needed.  If you tend to get constipated, take an over-the-counter stool softener such as Colace.  Vicodin can cause constipation.  Also be careful for sedation and incoordination.  Do not drive while taking this medication. 2.  Follow-up with [facility] soon as possible for your compression fractures. 3.  Return to emergency department if you have new worsening or concerning symptoms

## 2021-07-17 NOTE — ED Notes (Signed)
Patient ambulatory to and from the restroom without assistance.

## 2021-07-17 NOTE — ED Triage Notes (Signed)
Patient reports to the ER for a fall on Sunday morning. Patient has discoloration to the left hand. Patient reports she is having pain in the back and left flank area. Patient reports she has no trouble controlling her urine but has some trouble controlling her gas. [facility] sent patient to get CT scans for internal bleeding evaluation.

## 2021-07-17 NOTE — ED Provider Notes (Signed)
[name] Provider Note   CSN: [id number] Arrival date & time: 07/17/21  1750     History {Add pertinent medical, surgical, social history, OB history to HPI:1} Chief Complaint  Patient presents with  . Back Pain  . Fall    Pamela Larson is a 76 y.o. female.  HPI     Home Medications Prior to Admission medications   Medication Sig Start Date End Date Taking? Authorizing Provider  acetaminophen (TYLENOL) 500 MG tablet Take 500 mg by mouth every 6 (six) hours as needed.    [provider]  albuterol (VENTOLIN HFA) 108 (90 Base) MCG/ACT inhaler Inhale 2 puffs into the lungs every 4 (four) hours as needed for wheezing or shortness of breath. For shortness of breath 12/26/20   Kozlow, Donnamarie Poag, MD  ALPRAZolam Duanne Moron) 0.25 MG tablet Take 0.125 mg by mouth daily as needed for anxiety. Pt takes 1/2 tablet as needed    [provider]  calcium carbonate (OS-CAL - DOSED IN MG OF ELEMENTAL CALCIUM) 1250 MG tablet Take 1 tablet by mouth daily.    [provider]  dextromethorphan-guaiFENesin (MUCINEX DM) 30-600 MG 12hr tablet Take 1 tablet by mouth 2 (two) times daily.    [provider]  famotidine (PEPCID) 40 MG tablet Take 1 tablet (40 mg total) by mouth at bedtime. 06/26/21   Kozlow, Donnamarie Poag, MD  FLUoxetine (PROZAC) 20 MG tablet Take 20 mg by mouth 2 (two) times daily.    [provider]  fluticasone (FLONASE) 50 MCG/ACT nasal spray Place 1 spray into both nostrils daily as needed for allergies or rhinitis. 06/26/21   Kozlow, Donnamarie Poag, MD  ipratropium (ATROVENT) 0.03 % nasal spray Place 2 sprays into both nostrils 2 (two) times daily. 06/26/21   Kozlow, Donnamarie Poag, MD  levocetirizine (XYZAL) 5 MG tablet Take 1 tablet (5 mg total) by mouth 2 (two) times daily as needed (Can take an extra dose during flare ups.). 06/26/21   Kozlow, Donnamarie Poag, MD  levothyroxine (SYNTHROID, LEVOTHROID) 100 MCG tablet Take 50-100 mcg by mouth See  admin instructions. TAKES 100 MCG DAILY EXCEPT ON THURSDAYS AND TAKES 50 MCG    [provider]  loratadine (CLARITIN) 10 MG tablet Take 10 mg by mouth daily. Patient not taking: Reported on 07/11/2021    [provider]  montelukast (SINGULAIR) 10 MG tablet Take 1 tablet (10 mg total) by mouth at bedtime. 06/26/21   Kozlow, Donnamarie Poag, MD  Naproxen Sodium 220 MG CAPS Take by mouth.    [provider]  Olopatadine HCl 0.6 % SOLN Place 1 spray into the nose in the morning and at bedtime. 06/26/21   Kozlow, Donnamarie Poag, MD  omeprazole (PRILOSEC) 40 MG capsule Take 1 capsule (40 mg total) by mouth in the morning. TAKE 1 CAPSULE BY MOUTH EVERY MORNING 06/26/21   Kozlow, Donnamarie Poag, MD  predniSONE (DELTASONE) 20 MG tablet Take 2 tablets (40 mg total) by mouth daily with breakfast. 07/13/21   Cobb, Karie Schwalbe, NP  pseudoephedrine (SUDAFED) 30 MG tablet Take 30 mg by mouth every 4 (four) hours as needed for congestion.    [provider]  rosuvastatin (CRESTOR) 10 MG tablet  08/02/18   [provider]  umeclidinium-vilanterol (ANORO ELLIPTA) 62.5-25 MCG/ACT AEPB Inhale 1 puff into the lungs daily. 06/26/21   Kozlow, Donnamarie Poag, MD  Vitamin D, Ergocalciferol, (DRISDOL) 50000 UNITS CAPS Take 50,000 Units by mouth every 7 (seven) days. On Mondays  [provider]      Allergies    Amoxicillin, Augmentin [amoxicillin-pot clavulanate], Demerol [meperidine hcl], and Sulfa antibiotics    Review of Systems   Review of Systems  Physical Exam Updated Vital Signs BP (!) 183/72   Pulse 79   Temp 98.3 F (36.8 C)   Resp 15   SpO2 99%  Physical Exam  ED Results / Procedures / Treatments   Labs (all labs ordered are listed, but only abnormal results are displayed) Labs Reviewed  CBC WITH DIFFERENTIAL/PLATELET - Abnormal; Notable for the following components:      Result Value   RBC 3.56 (*)    Hemoglobin 11.9 (*)    MCV 103.7 (*)    All other components within normal  limits  BASIC METABOLIC PANEL    EKG None  Radiology No results found.  Procedures Procedures  {Document cardiac monitor, telemetry assessment procedure when appropriate:1}  Medications Ordered in ED Medications - No data to display  ED Course/ Medical Decision Making/ A&P                           Medical Decision Making Amount and/or Complexity of Data Reviewed Labs: ordered.   ***  {Document critical care time when appropriate:1} {Document review of labs and clinical decision tools ie heart score, Chads2Vasc2 etc:1}  {Document your independent review of radiology images, and any outside records:1} {Document your discussion with family members, caretakers, and with consultants:1} {Document social determinants of health affecting pt's care:1} {Document your decision making why or why not admission, treatments were needed:1} Final Clinical Impression(s) / ED Diagnoses Final diagnoses:  None    Rx / DC Orders ED Discharge Orders     None

## 2021-07-17 NOTE — ED Notes (Signed)
Patient transported to CT 

## 2021-07-24 DIAGNOSIS — S22070A Wedge compression fracture of T9-T10 vertebra, initial encounter for closed fracture: Secondary | ICD-10-CM | POA: Diagnosis not present

## 2021-07-31 ENCOUNTER — Ambulatory Visit: Payer: Medicare Other | Admitting: Allergy and Immunology

## 2021-08-02 DIAGNOSIS — S22040A Wedge compression fracture of fourth thoracic vertebra, initial encounter for closed fracture: Secondary | ICD-10-CM | POA: Diagnosis not present

## 2021-08-02 DIAGNOSIS — Z681 Body mass index (BMI) 19 or less, adult: Secondary | ICD-10-CM | POA: Diagnosis not present

## 2021-08-07 DIAGNOSIS — D692 Other nonthrombocytopenic purpura: Secondary | ICD-10-CM | POA: Diagnosis not present

## 2021-08-07 DIAGNOSIS — L821 Other seborrheic keratosis: Secondary | ICD-10-CM | POA: Diagnosis not present

## 2021-08-07 DIAGNOSIS — L820 Inflamed seborrheic keratosis: Secondary | ICD-10-CM | POA: Diagnosis not present

## 2021-08-07 DIAGNOSIS — D225 Melanocytic nevi of trunk: Secondary | ICD-10-CM | POA: Diagnosis not present

## 2021-08-08 DIAGNOSIS — D510 Vitamin B12 deficiency anemia due to intrinsic factor deficiency: Secondary | ICD-10-CM | POA: Diagnosis not present

## 2021-08-21 DIAGNOSIS — H43393 Other vitreous opacities, bilateral: Secondary | ICD-10-CM | POA: Diagnosis not present

## 2021-08-21 DIAGNOSIS — H35372 Puckering of macula, left eye: Secondary | ICD-10-CM | POA: Diagnosis not present

## 2021-08-21 DIAGNOSIS — H43813 Vitreous degeneration, bilateral: Secondary | ICD-10-CM | POA: Diagnosis not present

## 2021-08-21 DIAGNOSIS — H26493 Other secondary cataract, bilateral: Secondary | ICD-10-CM | POA: Diagnosis not present

## 2021-08-24 DIAGNOSIS — M79645 Pain in left finger(s): Secondary | ICD-10-CM | POA: Diagnosis not present

## 2021-08-29 DIAGNOSIS — M25511 Pain in right shoulder: Secondary | ICD-10-CM | POA: Diagnosis not present

## 2021-08-30 ENCOUNTER — Other Ambulatory Visit: Payer: Self-pay | Admitting: Otolaryngology

## 2021-08-30 ENCOUNTER — Ambulatory Visit
Admission: RE | Admit: 2021-08-30 | Discharge: 2021-08-30 | Disposition: A | Discharge status: Home / Self Care | Payer: Medicare Other | Source: Ambulatory Visit | Attending: Otolaryngology | Admitting: Otolaryngology

## 2021-08-30 DIAGNOSIS — M19011 Primary osteoarthritis, right shoulder: Secondary | ICD-10-CM | POA: Diagnosis not present

## 2021-08-30 DIAGNOSIS — M25411 Effusion, right shoulder: Secondary | ICD-10-CM | POA: Diagnosis not present

## 2021-08-30 DIAGNOSIS — S42211A Unspecified displaced fracture of surgical neck of right humerus, initial encounter for closed fracture: Secondary | ICD-10-CM | POA: Diagnosis not present

## 2021-08-30 DIAGNOSIS — M25511 Pain in right shoulder: Secondary | ICD-10-CM

## 2021-08-30 DIAGNOSIS — S42251A Displaced fracture of greater tuberosity of right humerus, initial encounter for closed fracture: Secondary | ICD-10-CM | POA: Diagnosis not present

## 2021-09-04 DIAGNOSIS — S42211A Unspecified displaced fracture of surgical neck of right humerus, initial encounter for closed fracture: Secondary | ICD-10-CM | POA: Diagnosis not present

## 2021-09-04 DIAGNOSIS — M25511 Pain in right shoulder: Secondary | ICD-10-CM | POA: Diagnosis not present

## 2021-09-13 ENCOUNTER — Encounter (HOSPITAL_COMMUNITY): Payer: Self-pay | Admitting: Orthopedic Surgery

## 2021-09-13 ENCOUNTER — Other Ambulatory Visit: Payer: Self-pay

## 2021-09-13 DIAGNOSIS — S42211A Unspecified displaced fracture of surgical neck of right humerus, initial encounter for closed fracture: Secondary | ICD-10-CM | POA: Diagnosis not present

## 2021-09-13 NOTE — Progress Notes (Signed)
Surgery orders requested via Epic inbox. °

## 2021-09-13 NOTE — Progress Notes (Signed)
Spoke to patient's daughter Jaci Standard and went over preop instructions per patient's request.  Her daughter was advised to have patient at [facility] at 1215, no solid food after midnight and clear liquids up until 1145.  All questions answered and patient's daughter stated understanding.

## 2021-09-13 NOTE — Progress Notes (Addendum)
COVID Vaccine Completed: Yes  Date of COVID positive in last 90 days:  No  PCP - Marton Redwood, MD Cardiologist - Edmond Rodman Pickle, MD  Chest x-ray - 05-29-21 Epic EKG - N/A Stress Test -  N/A ECHO -  N/A Cardiac Cath -  N/A Pacemaker/ICD device last checked: Spinal Cord Stimulator: N/A  Bowel Prep - N/A  Sleep Study - N/A CPAP -   Fasting Blood Sugar - N/A Checks Blood Sugar _____ times a day  Blood Thinner Instructions:  N/A Aspirin Instructions: Last Dose:  Activity level:   Can go up a flight of stairs and perform activities of daily living without stopping and without symptoms of chest pain or shortness of breath.  Anesthesia review:  COPD, emphysema, CKD  Patient denies shortness of breath, fever, cough and chest pain at PAT appointment  Patient verbalized understanding of instructions that were given to them at the PAT appointment. Patient was also instructed that they will need to review over the PAT instructions again at home before surgery.

## 2021-09-13 NOTE — H&P (Signed)
Patient's anticipated LOS is less than 2 midnights, meeting these requirements: - Younger than 62 - Lives within 1 hour of care - Has a competent adult at home to recover with post-op recover - NO history of  - Chronic pain requiring opiods  - Diabetes  - Coronary Artery Disease  - Heart failure  - Heart attack  - Stroke  - DVT/VTE  - Cardiac arrhythmia  - Respiratory Failure/COPD  - Renal failure  - Anemia  - Advanced Liver disease     Pamela Larson is an 76 y.o. female.    Chief Complaint: right shoulder pain  HPI: Pt is a 76 y.o. female complaining of right shoulder pain for multiple years. Pain had continually increased since the beginning. X-rays in the clinic show displaced right proximal humerus fracture. Risks, benefits and expectations were discussed with the patient. Patient understand the risks, benefits and expectations and wishes to proceed with surgery.   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    Osteoporosis    Pneumonia    hx of    Rheumatoid arthritis ([place])     PSH: Past Surgical History:  Procedure Laterality Date   APPENDECTOMY  1978   bladder hydrodistention  1998   BREAST SURGERY     reduction    CERVICAL DISC ARTHROPLASTY N/A 04/03/2016   Procedure: CERVICAL SIX CERVICAL SEVEN [place];  Surgeon: Jovita Gamma, MD;  Location: [facility];   Service: Neurosurgery;  Laterality: N/A;  left side approach   COSMETIC SURGERY  2005   face lift   EXCISION MORTON'S NEUROMA Left 05/08/2017   Procedure: EXCISION MORTON'S NEUROMA Left 3rd webspace;  Surgeon: [name], MD;  Location: [facility];  Service: Orthopedics;  Laterality: Left;   EYE SURGERY     lower eye lids   LUMBAR LAMINECTOMY/DECOMPRESSION MICRODISCECTOMY  06/10/2011   Procedure: LUMBAR LAMINECTOMY/DECOMPRESSION MICRODISCECTOMY 1 LEVEL;  Surgeon: Hosie Spangle, MD;  Location: [facility];  Service: Neurosurgery;  Laterality: Left;  Lumbar Five Sacral One Lumbar Laminectomy Decompression Microdiscectomy     Social History:  reports that she quit smoking about 31 years ago. Her smoking use included cigarettes. She has a 40.00 pack-year smoking history. She has never used smokeless tobacco. She reports current alcohol use of about 4.0 standard drinks of alcohol per week. She reports that she does not use drugs. BMI: Estimated body mass index is 20.51 kg/m as calculated from the following:   Height as of this encounter: 5' (1.524 m).   Weight as of this encounter: 47.6 kg.  Lab Results  Component Value Date   ALBUMIN 4.2 05/11/2019   Diabetes: Patient does not have a diagnosis of  diabetes.     Smoking Status: Social History   Tobacco Use  Smoking Status Former   Packs/day: 2.00   Years: 20.00   Total pack years: 40.00   Types: Cigarettes   Quit date: 02/11/1990   Years since quitting: 31.6  Smokeless Tobacco Never   The patient is not currently a tobacco user. Counseling given: Not Answered     Allergies:  Allergies  Allergen Reactions   Amoxicillin Nausea And Vomiting     Has patient had a PCN reaction causing immediate rash, facial/tongue/throat swelling, SOB or lightheadedness with hypotension: #  #  #  YES  #  #  #  Has patient had a PCN reaction causing severe rash involving mucus membranes or skin necrosis: No Has patient had a  PCN reaction that required hospitalization No Has patient had a PCN reaction occurring within the last 10 years: #  #  #  YES  #  #  #  If all of the above answers are "NO", then may proceed with Cephalosporin use.   Augmentin [Amoxicillin-Pot Clavulanate] Nausea And Vomiting   Demerol [Meperidine Hcl] Nausea And Vomiting   Sulfa Antibiotics Nausea And Vomiting    Medications: No current facility-administered medications for this encounter.   Current Outpatient Medications  Medication Sig Dispense Refill   acetaminophen (TYLENOL) 500 MG tablet Take 1,000-1,500 mg by mouth every 6 (six) hours as needed for moderate pain or mild pain.     albuterol (VENTOLIN HFA) 108 (90 Base) MCG/ACT inhaler Inhale 2 puffs into the lungs every 4 (four) hours as needed for wheezing or shortness of breath. For shortness of breath 18 g 1   ALPRAZolam (XANAX) 0.25 MG tablet Take 0.125 mg by mouth daily as needed for anxiety.     calcium carbonate (OS-CAL - DOSED IN MG OF ELEMENTAL CALCIUM) 1250 MG tablet Take 1 tablet by mouth daily.     Cyanocobalamin (VITAMIN B-12 IJ) Inject as directed every 30 (thirty) days.     dextromethorphan-guaiFENesin (MUCINEX DM) 30-600 MG 12hr tablet Take 1 tablet by mouth 2 (two) times daily.     diazepam (VALIUM) 5 MG tablet Take 5 mg by mouth every 6 (six) hours as needed for muscle spasms.     famotidine (PEPCID) 40 MG tablet Take 1 tablet (40 mg total) by mouth at bedtime. 30 tablet 5   FLUoxetine (PROZAC) 20 MG tablet Take 40 mg by mouth daily.     fluticasone (FLONASE) 50 MCG/ACT nasal spray Place 1 spray into both nostrils daily as needed for allergies or rhinitis. (Patient taking differently: Place 1 spray into both nostrils 2 (two) times daily.) 16 g 5   HYDROcodone-acetaminophen (NORCO/VICODIN) 5-325 MG tablet Take 1-2 tablets by mouth every 6 (six) hours as needed for moderate pain or severe pain. 20 tablet 0   ipratropium (ATROVENT) 0.03 % nasal spray Place 2 sprays into  both nostrils 2 (two) times daily. 30 mL 5   levocetirizine (XYZAL) 5 MG tablet Take 1 tablet (5 mg total) by mouth 2 (two) times daily as needed (Can take an extra dose during flare ups.). 60 tablet 5   levothyroxine (SYNTHROID) 75 MCG tablet Take 75 mcg by mouth daily before breakfast.     montelukast (SINGULAIR) 10 MG tablet Take 1 tablet (10 mg total) by mouth at bedtime. 30 tablet 5   Naproxen Sodium 220 MG CAPS Take 220-440 mg by mouth daily as needed (pain/ fever).     Olopatadine HCl 0.6 %  SOLN Place 1 spray into the nose in the morning and at bedtime. 30.5 g 5   omeprazole (PRILOSEC) 40 MG capsule Take 1 capsule (40 mg total) by mouth in the morning. TAKE 1 CAPSULE BY MOUTH EVERY MORNING 30 capsule 5   pseudoephedrine (SUDAFED) 30 MG tablet Take 30 mg by mouth every 4 (four) hours as needed for congestion.     rosuvastatin (CRESTOR) 10 MG tablet Take 10 mg by mouth every morning.     umeclidinium-vilanterol (ANORO ELLIPTA) 62.5-25 MCG/ACT AEPB Inhale 1 puff into the lungs daily. 60 each 5   Vitamin D, Ergocalciferol, (DRISDOL) 50000 UNITS CAPS Take 50,000 Units by mouth every 7 (seven) days. On Mondays     predniSONE (DELTASONE) 20 MG tablet Take 2 tablets (40 mg total) by mouth daily with breakfast. (Patient not taking: Reported on 09/13/2021) 10 tablet 0    No results found for this or any previous visit (from the past 48 hour(s)). No results found.  ROS: Pain with rom of the right upper extremity  Physical Exam: Alert and oriented 76 y.o. female in no acute distress Cranial nerves 2-12 intact Cervical spine: full rom with no tenderness, nv intact distally Chest: active breath sounds bilaterally, no wheeze rhonchi or rales Heart: regular rate and rhythm, no murmur Abd: non tender non distended with active bowel sounds Hip is stable with rom  Right shoulder with mild edema and ecchymosis Nv intact distally No rashes or signs of open injury  distally  Assessment/Plan Assessment: displaced right proximal humerus fracture  Plan:  Patient will undergo a right reverse total shoulder by Dr. Veverly Fells at [facility] Risks benefits and expectations were discussed with the patient. Patient understand risks, benefits and expectations and wishes to proceed. Preoperative templating of the joint replacement has been completed, documented, and submitted to the Operating Room personnel in order to optimize intra-operative equipment management.   Merla Riches PA-C, MPAS [facility] is now [facility] [address] [place], [place], [place] [address] Phone: [phone] [email or web address]  [employer]

## 2021-09-14 ENCOUNTER — Other Ambulatory Visit: Payer: Self-pay

## 2021-09-14 ENCOUNTER — Inpatient Hospital Stay (HOSPITAL_COMMUNITY)
Admission: AD | Admit: 2021-09-14 | Discharge: 2021-09-16 | DRG: 483 | Disposition: A | Discharge status: Home / Self Care | Payer: Medicare Other | Attending: Orthopedic Surgery | Admitting: Orthopedic Surgery

## 2021-09-14 ENCOUNTER — Observation Stay (HOSPITAL_COMMUNITY): Payer: Medicare Other

## 2021-09-14 ENCOUNTER — Ambulatory Visit (HOSPITAL_BASED_OUTPATIENT_CLINIC_OR_DEPARTMENT_OTHER): Payer: Medicare Other | Admitting: Physician Assistant

## 2021-09-14 ENCOUNTER — Encounter (HOSPITAL_COMMUNITY): Payer: Self-pay | Admitting: Orthopedic Surgery

## 2021-09-14 ENCOUNTER — Ambulatory Visit (HOSPITAL_COMMUNITY): Payer: Medicare Other | Admitting: Physician Assistant

## 2021-09-14 ENCOUNTER — Encounter (HOSPITAL_COMMUNITY)
Admission: AD | Disposition: A | Discharge status: Home / Self Care | Payer: Self-pay | Source: Home / Self Care | Attending: Orthopedic Surgery

## 2021-09-14 DIAGNOSIS — Z79890 Hormone replacement therapy (postmenopausal): Secondary | ICD-10-CM | POA: Diagnosis not present

## 2021-09-14 DIAGNOSIS — E039 Hypothyroidism, unspecified: Secondary | ICD-10-CM | POA: Diagnosis present

## 2021-09-14 DIAGNOSIS — N289 Disorder of kidney and ureter, unspecified: Secondary | ICD-10-CM

## 2021-09-14 DIAGNOSIS — M797 Fibromyalgia: Secondary | ICD-10-CM | POA: Diagnosis present

## 2021-09-14 DIAGNOSIS — S42201A Unspecified fracture of upper end of right humerus, initial encounter for closed fracture: Secondary | ICD-10-CM | POA: Diagnosis not present

## 2021-09-14 DIAGNOSIS — Z87442 Personal history of urinary calculi: Secondary | ICD-10-CM

## 2021-09-14 DIAGNOSIS — Z881 Allergy status to other antibiotic agents status: Secondary | ICD-10-CM

## 2021-09-14 DIAGNOSIS — M069 Rheumatoid arthritis, unspecified: Secondary | ICD-10-CM | POA: Diagnosis present

## 2021-09-14 DIAGNOSIS — Z885 Allergy status to narcotic agent status: Secondary | ICD-10-CM | POA: Diagnosis not present

## 2021-09-14 DIAGNOSIS — S42251A Displaced fracture of greater tuberosity of right humerus, initial encounter for closed fracture: Secondary | ICD-10-CM | POA: Diagnosis present

## 2021-09-14 DIAGNOSIS — D649 Anemia, unspecified: Secondary | ICD-10-CM

## 2021-09-14 DIAGNOSIS — Z87891 Personal history of nicotine dependence: Secondary | ICD-10-CM | POA: Diagnosis not present

## 2021-09-14 DIAGNOSIS — R5382 Chronic fatigue, unspecified: Secondary | ICD-10-CM | POA: Diagnosis present

## 2021-09-14 DIAGNOSIS — S42211A Unspecified displaced fracture of surgical neck of right humerus, initial encounter for closed fracture: Secondary | ICD-10-CM | POA: Diagnosis present

## 2021-09-14 DIAGNOSIS — G8918 Other acute postprocedural pain: Secondary | ICD-10-CM | POA: Diagnosis not present

## 2021-09-14 DIAGNOSIS — K219 Gastro-esophageal reflux disease without esophagitis: Secondary | ICD-10-CM | POA: Diagnosis present

## 2021-09-14 DIAGNOSIS — Z96611 Presence of right artificial shoulder joint: Secondary | ICD-10-CM | POA: Diagnosis not present

## 2021-09-14 DIAGNOSIS — S42291A Other displaced fracture of upper end of right humerus, initial encounter for closed fracture: Secondary | ICD-10-CM | POA: Diagnosis not present

## 2021-09-14 DIAGNOSIS — M810 Age-related osteoporosis without current pathological fracture: Secondary | ICD-10-CM | POA: Diagnosis present

## 2021-09-14 DIAGNOSIS — W19XXXA Unspecified fall, initial encounter: Secondary | ICD-10-CM | POA: Diagnosis present

## 2021-09-14 DIAGNOSIS — Z79899 Other long term (current) drug therapy: Secondary | ICD-10-CM | POA: Diagnosis not present

## 2021-09-14 DIAGNOSIS — J449 Chronic obstructive pulmonary disease, unspecified: Secondary | ICD-10-CM | POA: Diagnosis present

## 2021-09-14 DIAGNOSIS — Z01818 Encounter for other preprocedural examination: Principal | ICD-10-CM

## 2021-09-14 DIAGNOSIS — Z471 Aftercare following joint replacement surgery: Secondary | ICD-10-CM | POA: Diagnosis not present

## 2021-09-14 HISTORY — DX: Deviated nasal septum: J34.2

## 2021-09-14 HISTORY — DX: Chronic obstructive pulmonary disease, unspecified: J44.9

## 2021-09-14 HISTORY — PX: REVERSE SHOULDER ARTHROPLASTY: SHX5054

## 2021-09-14 HISTORY — DX: Interstitial cystitis (chronic) without hematuria: N30.10

## 2021-09-14 LAB — BASIC METABOLIC PANEL
Anion gap: 7 (ref 5–15)
BUN: 11 mg/dL (ref 8–23)
CO2: 23 mmol/L (ref 22–32)
Calcium: 9.2 mg/dL (ref 8.9–10.3)
Chloride: 109 mmol/L (ref 98–111)
Creatinine, Ser: 0.5 mg/dL (ref 0.44–1.00)
GFR, Estimated: >60 mL/min (ref >60)
Glucose, Bld: 89 mg/dL (ref 70–99)
Potassium: 4.1 mmol/L (ref 3.5–5.1)
Sodium: 139 mmol/L (ref 135–145)

## 2021-09-14 LAB — CBC
HCT: 35.8 % — ABNORMAL LOW (ref 36.0–46.0)
Hemoglobin: 11.3 g/dL — ABNORMAL LOW (ref 12.0–15.0)
MCH: 34.2 pg — ABNORMAL HIGH (ref 26.0–34.0)
MCHC: 31.6 g/dL (ref 30.0–36.0)
MCV: 108.5 fL — ABNORMAL HIGH (ref 80.0–100.0)
Platelets: 448 10*3/uL — ABNORMAL HIGH (ref 150–400)
RBC: 3.3 MIL/uL — ABNORMAL LOW (ref 3.87–5.11)
RDW: 12.7 % (ref 11.5–15.5)
WBC: 5.3 10*3/uL (ref 4.0–10.5)
nRBC: 0 % (ref 0.0–0.2)

## 2021-09-14 LAB — SURGICAL PCR SCREEN
MRSA, PCR: NEGATIVE
Staphylococcus aureus: NEGATIVE

## 2021-09-14 SURGERY — ARTHROPLASTY, SHOULDER, TOTAL, REVERSE
Anesthesia: General | Site: Shoulder | Laterality: Right

## 2021-09-14 MED ORDER — MONTELUKAST SODIUM 10 MG PO TABS
10.0000 mg | ORAL_TABLET | Freq: Every day | ORAL | Status: DC
  Administered 2021-09-15: 10 mg via ORAL
  Filled 2021-09-14 (×2): qty 1

## 2021-09-14 MED ORDER — PHENYLEPHRINE HCL-NACL 20-0.9 MG/250ML-% IV SOLN
INTRAVENOUS | Status: DC | PRN
  Administered 2021-09-14: 50 ug/min via INTRAVENOUS

## 2021-09-14 MED ORDER — ONDANSETRON HCL 4 MG/2ML IJ SOLN: 4.0000 mg | Freq: Four times a day (QID) | INTRAMUSCULAR | Status: DC | PRN

## 2021-09-14 MED ORDER — FLUTICASONE PROPIONATE 50 MCG/ACT NA SUSP: 1.0000 | Freq: Every day | NASAL | Status: DC | PRN

## 2021-09-14 MED ORDER — LEVOCETIRIZINE DIHYDROCHLORIDE 5 MG PO TABS: 5.0000 mg | ORAL_TABLET | Freq: Two times a day (BID) | ORAL | Status: DC | PRN

## 2021-09-14 MED ORDER — DEXAMETHASONE SODIUM PHOSPHATE 10 MG/ML IJ SOLN
INTRAMUSCULAR | Status: DC | PRN
  Administered 2021-09-14: 5 mg via INTRAVENOUS

## 2021-09-14 MED ORDER — ALPRAZOLAM 0.25 MG PO TABS
0.1250 mg | ORAL_TABLET | Freq: Every day | ORAL | Status: DC | PRN
  Administered 2021-09-16: 0.125 mg via ORAL
  Filled 2021-09-14: qty 1

## 2021-09-14 MED ORDER — OLOPATADINE HCL 0.6 % NA SOLN
1.0000 | Freq: Two times a day (BID) | NASAL | Status: DC
Start: 2021-09-14 — End: 2021-09-16
  Administered 2021-09-14 – 2021-09-16 (×4): 1 via NASAL

## 2021-09-14 MED ORDER — LIP MEDEX EX OINT: TOPICAL_OINTMENT | CUTANEOUS | Status: DC | PRN

## 2021-09-14 MED ORDER — METOCLOPRAMIDE HCL 5 MG/ML IJ SOLN: 5.0000 mg | Freq: Three times a day (TID) | INTRAMUSCULAR | Status: DC | PRN

## 2021-09-14 MED ORDER — ALBUTEROL SULFATE (2.5 MG/3ML) 0.083% IN NEBU: 3.0000 mL | INHALATION_SOLUTION | RESPIRATORY_TRACT | Status: DC | PRN

## 2021-09-14 MED ORDER — OXYCODONE HCL 5 MG PO TABS: 5.0000 mg | ORAL_TABLET | Freq: Once | ORAL | Status: DC | PRN

## 2021-09-14 MED ORDER — NAPROXEN SODIUM 220 MG PO CAPS: 220.0000 mg | ORAL_CAPSULE | Freq: Every day | ORAL | Status: DC | PRN

## 2021-09-14 MED ORDER — LEVOTHYROXINE SODIUM 75 MCG PO TABS
75.0000 ug | ORAL_TABLET | Freq: Every day | ORAL | Status: DC
Start: 2021-09-15 — End: 2021-09-16
  Administered 2021-09-15 – 2021-09-16 (×2): 75 ug via ORAL
  Filled 2021-09-14 (×2): qty 1

## 2021-09-14 MED ORDER — ACETAMINOPHEN 10 MG/ML IV SOLN
1000.0000 mg | Freq: Once | INTRAVENOUS | Status: DC | PRN
Start: 2021-09-14 — End: 2021-09-14

## 2021-09-14 MED ORDER — SODIUM CHLORIDE 0.9 % IR SOLN
Status: DC | PRN
  Administered 2021-09-14: 1000 mL

## 2021-09-14 MED ORDER — CEFAZOLIN SODIUM-DEXTROSE 2-4 GM/100ML-% IV SOLN
2.0000 g | Freq: Four times a day (QID) | INTRAVENOUS | Status: AC
  Administered 2021-09-14 – 2021-09-15 (×3): 2 g via INTRAVENOUS
  Filled 2021-09-14 (×3): qty 100

## 2021-09-14 MED ORDER — ONDANSETRON HCL 4 MG/2ML IJ SOLN: 4.0000 mg | Freq: Once | INTRAMUSCULAR | Status: DC | PRN

## 2021-09-14 MED ORDER — TRANEXAMIC ACID-NACL 1000-0.7 MG/100ML-% IV SOLN
1000.0000 mg | Freq: Once | INTRAVENOUS | Status: AC
  Administered 2021-09-14: 1000 mg via INTRAVENOUS
  Filled 2021-09-14: qty 100

## 2021-09-14 MED ORDER — FAMOTIDINE 20 MG PO TABS
40.0000 mg | ORAL_TABLET | Freq: Every day | ORAL | Status: DC
  Administered 2021-09-14 – 2021-09-15 (×2): 40 mg via ORAL
  Filled 2021-09-14 (×2): qty 2

## 2021-09-14 MED ORDER — DOCUSATE SODIUM 100 MG PO CAPS
100.0000 mg | ORAL_CAPSULE | Freq: Two times a day (BID) | ORAL | Status: DC
  Administered 2021-09-14 – 2021-09-16 (×4): 100 mg via ORAL
  Filled 2021-09-14 (×4): qty 1

## 2021-09-14 MED ORDER — FENTANYL CITRATE PF 50 MCG/ML IJ SOSY
50.0000 ug | PREFILLED_SYRINGE | Freq: Once | INTRAMUSCULAR | Status: AC
  Administered 2021-09-14: 50 ug via INTRAVENOUS
  Filled 2021-09-14: qty 2

## 2021-09-14 MED ORDER — NAPROXEN 250 MG PO TABS
250.0000 mg | ORAL_TABLET | Freq: Every day | ORAL | Status: DC | PRN
  Administered 2021-09-16: 375 mg via ORAL
  Filled 2021-09-14: qty 2
  Filled 2021-09-14: qty 1

## 2021-09-14 MED ORDER — LORATADINE 10 MG PO TABS: 10.0000 mg | ORAL_TABLET | Freq: Every day | ORAL | Status: DC | PRN

## 2021-09-14 MED ORDER — HYDROCODONE-ACETAMINOPHEN 5-325 MG PO TABS: 1.0000 | ORAL_TABLET | Freq: Four times a day (QID) | ORAL | 0 refills | Status: DC | PRN

## 2021-09-14 MED ORDER — BISACODYL 10 MG RE SUPP: 10.0000 mg | Freq: Every day | RECTAL | Status: DC | PRN

## 2021-09-14 MED ORDER — PREDNISONE 20 MG PO TABS
40.0000 mg | ORAL_TABLET | Freq: Every day | ORAL | Status: DC
  Administered 2021-09-15 – 2021-09-16 (×2): 40 mg via ORAL
  Filled 2021-09-14 (×2): qty 2

## 2021-09-14 MED ORDER — BUPIVACAINE-EPINEPHRINE (PF) 0.25% -1:200000 IJ SOLN
INTRAMUSCULAR | Status: DC | PRN
  Administered 2021-09-14: 13 mL

## 2021-09-14 MED ORDER — TRANEXAMIC ACID-NACL 1000-0.7 MG/100ML-% IV SOLN
INTRAVENOUS | Status: AC
  Filled 2021-09-14: qty 100

## 2021-09-14 MED ORDER — TRANEXAMIC ACID 1000 MG/10ML IV SOLN
INTRAVENOUS | Status: DC | PRN
  Administered 2021-09-14: 1000 mg via INTRAVENOUS

## 2021-09-14 MED ORDER — OXYCODONE HCL 5 MG/5ML PO SOLN: 5.0000 mg | Freq: Once | ORAL | Status: DC | PRN

## 2021-09-14 MED ORDER — BUPIVACAINE-EPINEPHRINE (PF) 0.25% -1:200000 IJ SOLN
INTRAMUSCULAR | Status: AC
Start: 2021-09-14 — End: ?
  Filled 2021-09-14: qty 30

## 2021-09-14 MED ORDER — VITAMIN D (ERGOCALCIFEROL) 1.25 MG (50000 UNIT) PO CAPS: 50000.0000 [IU] | ORAL_CAPSULE | ORAL | Status: DC

## 2021-09-14 MED ORDER — SODIUM CHLORIDE 0.9 % IV SOLN: INTRAVENOUS | Status: DC

## 2021-09-14 MED ORDER — UMECLIDINIUM-VILANTEROL 62.5-25 MCG/ACT IN AEPB
1.0000 | INHALATION_SPRAY | Freq: Every day | RESPIRATORY_TRACT | Status: DC
Start: 2021-09-14 — End: 2021-09-15
  Filled 2021-09-14: qty 14

## 2021-09-14 MED ORDER — FENTANYL CITRATE PF 50 MCG/ML IJ SOSY: 25.0000 ug | PREFILLED_SYRINGE | INTRAMUSCULAR | Status: DC | PRN

## 2021-09-14 MED ORDER — ONDANSETRON HCL 4 MG/2ML IJ SOLN
INTRAMUSCULAR | Status: DC | PRN
  Administered 2021-09-14: 4 mg via INTRAVENOUS

## 2021-09-14 MED ORDER — BUPIVACAINE HCL (PF) 0.5 % IJ SOLN
INTRAMUSCULAR | Status: DC | PRN
  Administered 2021-09-14: 15 mL via PERINEURAL

## 2021-09-14 MED ORDER — ROSUVASTATIN CALCIUM 10 MG PO TABS
10.0000 mg | ORAL_TABLET | Freq: Every morning | ORAL | Status: DC
  Administered 2021-09-15 – 2021-09-16 (×2): 10 mg via ORAL
  Filled 2021-09-14 (×2): qty 1

## 2021-09-14 MED ORDER — MORPHINE SULFATE (PF) 2 MG/ML IV SOLN: 0.5000 mg | INTRAVENOUS | Status: DC | PRN

## 2021-09-14 MED ORDER — MENTHOL 3 MG MT LOZG: 1.0000 | LOZENGE | OROMUCOSAL | Status: DC | PRN

## 2021-09-14 MED ORDER — PANTOPRAZOLE SODIUM 40 MG PO TBEC
40.0000 mg | DELAYED_RELEASE_TABLET | Freq: Every day | ORAL | Status: DC
  Administered 2021-09-15 – 2021-09-16 (×2): 40 mg via ORAL
  Filled 2021-09-14 (×2): qty 1

## 2021-09-14 MED ORDER — CEFAZOLIN SODIUM-DEXTROSE 2-4 GM/100ML-% IV SOLN
2.0000 g | INTRAVENOUS | Status: AC
  Administered 2021-09-14: 2 g via INTRAVENOUS
  Filled 2021-09-14: qty 100

## 2021-09-14 MED ORDER — ORAL CARE MOUTH RINSE: 15.0000 mL | Freq: Once | OROMUCOSAL | Status: DC

## 2021-09-14 MED ORDER — PSEUDOEPHEDRINE HCL 30 MG PO TABS
30.0000 mg | ORAL_TABLET | ORAL | Status: DC | PRN
Start: 2021-09-14 — End: 2021-09-16

## 2021-09-14 MED ORDER — DIAZEPAM 5 MG PO TABS
5.0000 mg | ORAL_TABLET | Freq: Four times a day (QID) | ORAL | Status: DC | PRN
  Administered 2021-09-14 – 2021-09-15 (×3): 5 mg via ORAL
  Filled 2021-09-14 (×3): qty 1

## 2021-09-14 MED ORDER — CHLORHEXIDINE GLUCONATE 0.12 % MT SOLN: 15.0000 mL | Freq: Once | OROMUCOSAL | Status: DC

## 2021-09-14 MED ORDER — SUCCINYLCHOLINE CHLORIDE 200 MG/10ML IV SOSY
PREFILLED_SYRINGE | INTRAVENOUS | Status: DC | PRN
  Administered 2021-09-14: 80 mg via INTRAVENOUS

## 2021-09-14 MED ORDER — ONDANSETRON HCL 4 MG PO TABS: 4.0000 mg | ORAL_TABLET | Freq: Four times a day (QID) | ORAL | Status: DC | PRN

## 2021-09-14 MED ORDER — ACETAMINOPHEN 325 MG PO TABS: 325.0000 mg | ORAL_TABLET | Freq: Four times a day (QID) | ORAL | Status: DC | PRN

## 2021-09-14 MED ORDER — FLUOXETINE HCL 20 MG PO CAPS
40.0000 mg | ORAL_CAPSULE | Freq: Every day | ORAL | Status: DC
  Administered 2021-09-15 – 2021-09-16 (×2): 40 mg via ORAL
  Filled 2021-09-14 (×2): qty 2

## 2021-09-14 MED ORDER — HYDROCODONE-ACETAMINOPHEN 5-325 MG PO TABS
1.0000 | ORAL_TABLET | ORAL | Status: DC | PRN
  Administered 2021-09-14 – 2021-09-16 (×10): 2 via ORAL
  Filled 2021-09-14 (×10): qty 2

## 2021-09-14 MED ORDER — BUPIVACAINE LIPOSOME 1.3 % IJ SUSP
INTRAMUSCULAR | Status: DC | PRN
  Administered 2021-09-14: 10 mL via PERINEURAL

## 2021-09-14 MED ORDER — METOCLOPRAMIDE HCL 5 MG PO TABS: 5.0000 mg | ORAL_TABLET | Freq: Three times a day (TID) | ORAL | Status: DC | PRN

## 2021-09-14 MED ORDER — PHENYLEPHRINE 80 MCG/ML (10ML) SYRINGE FOR IV PUSH (FOR BLOOD PRESSURE SUPPORT)
PREFILLED_SYRINGE | INTRAVENOUS | Status: DC | PRN
  Administered 2021-09-14: 80 ug via INTRAVENOUS
  Administered 2021-09-14 (×2): 40 ug via INTRAVENOUS

## 2021-09-14 MED ORDER — PHENOL 1.4 % MT LIQD
1.0000 | OROMUCOSAL | Status: DC | PRN
Start: 2021-09-14 — End: 2021-09-16

## 2021-09-14 MED ORDER — POLYETHYLENE GLYCOL 3350 17 G PO PACK: 17.0000 g | PACK | Freq: Every day | ORAL | Status: DC | PRN

## 2021-09-14 MED ORDER — PHENYLEPHRINE 80 MCG/ML (10ML) SYRINGE FOR IV PUSH (FOR BLOOD PRESSURE SUPPORT)
PREFILLED_SYRINGE | INTRAVENOUS | Status: AC
Start: 2021-09-14 — End: ?
  Filled 2021-09-14: qty 10

## 2021-09-14 MED ORDER — LIDOCAINE HCL (CARDIAC) PF 100 MG/5ML IV SOSY
PREFILLED_SYRINGE | INTRAVENOUS | Status: DC | PRN
  Administered 2021-09-14: 60 mg via INTRAVENOUS

## 2021-09-14 MED ORDER — LACTATED RINGERS IV SOLN: INTRAVENOUS | Status: DC

## 2021-09-14 MED ORDER — DM-GUAIFENESIN ER 30-600 MG PO TB12
1.0000 | ORAL_TABLET | Freq: Two times a day (BID) | ORAL | Status: DC
Start: 2021-09-14 — End: 2021-09-16
  Administered 2021-09-15 – 2021-09-16 (×3): 1 via ORAL
  Filled 2021-09-14 (×4): qty 1

## 2021-09-14 MED ORDER — MIDAZOLAM HCL 2 MG/2ML IJ SOLN
1.0000 mg | Freq: Once | INTRAMUSCULAR | Status: DC
  Filled 2021-09-14: qty 2

## 2021-09-14 MED ORDER — IPRATROPIUM BROMIDE 0.03 % NA SOLN
2.0000 | Freq: Two times a day (BID) | NASAL | Status: DC
Start: 2021-09-14 — End: 2021-09-16
  Administered 2021-09-15 – 2021-09-16 (×3): 2 via NASAL
  Filled 2021-09-14: qty 30

## 2021-09-14 MED ORDER — CALCIUM CARBONATE 1250 (500 CA) MG PO TABS
1.0000 | ORAL_TABLET | Freq: Every day | ORAL | Status: DC
  Administered 2021-09-16: 1250 mg via ORAL
  Filled 2021-09-14 (×2): qty 1

## 2021-09-14 MED ORDER — PROPOFOL 10 MG/ML IV BOLUS
INTRAVENOUS | Status: DC | PRN
  Administered 2021-09-14: 50 mg via INTRAVENOUS
  Administered 2021-09-14: 100 mg via INTRAVENOUS

## 2021-09-14 SURGICAL SUPPLY — 79 items
AID PSTN UNV HD RSTRNT DISP (MISCELLANEOUS) ×1
BAG COUNTER SPONGE SURGICOUNT (BAG) ×1 IMPLANT
BAG SPEC THK2 15X12 ZIP CLS (MISCELLANEOUS)
BAG SPNG CNTER NS LX DISP (BAG) ×1
BAG ZIPLOCK 12X15 (MISCELLANEOUS) IMPLANT
BIT DRILL 1.6MX128 (BIT) ×1 IMPLANT
BIT DRILL 170X2.5X (BIT) IMPLANT
BIT DRL 170X2.5X (BIT) ×1
BLADE SAG 18X100X1.27 (BLADE) ×2 IMPLANT
CEMENT HV SMART SET (Cement) ×1 IMPLANT
COOLER ICEMAN CLASSIC (MISCELLANEOUS) ×1 IMPLANT
COVER BACK TABLE 60X90IN (DRAPES) ×2 IMPLANT
COVER SURGICAL LIGHT HANDLE (MISCELLANEOUS) ×2 IMPLANT
DRAPE INCISE IOBAN 66X45 STRL (DRAPES) ×2 IMPLANT
DRAPE ORTHO SPLIT 77X108 STRL (DRAPES) ×4
DRAPE SHEET LG 3/4 BI-LAMINATE (DRAPES) ×2 IMPLANT
DRAPE SURG ORHT 6 SPLT 77X108 (DRAPES) ×2 IMPLANT
DRAPE TOP 10253 STERILE (DRAPES) ×2 IMPLANT
DRAPE U-SHAPE 47X51 STRL (DRAPES) ×2 IMPLANT
DRILL 2.5 (BIT) ×2
DRSG ADAPTIC 3X8 NADH LF (GAUZE/BANDAGES/DRESSINGS) ×2 IMPLANT
DRSG PAD ABDOMINAL 8X10 ST (GAUZE/BANDAGES/DRESSINGS) ×2 IMPLANT
DURAPREP 26ML APPLICATOR (WOUND CARE) ×2 IMPLANT
ELECT BLADE TIP CTD 4 INCH (ELECTRODE) ×2 IMPLANT
ELECT NDL TIP 2.8 STRL (NEEDLE) ×1 IMPLANT
ELECT NEEDLE TIP 2.8 STRL (NEEDLE) ×2 IMPLANT
ELECT REM PT RETURN 15FT ADLT (MISCELLANEOUS) ×2 IMPLANT
FACESHIELD WRAPAROUND (MASK) ×2 IMPLANT
FACESHIELD WRAPAROUND OR TEAM (MASK) ×1 IMPLANT
GAUZE SPONGE 4X4 12PLY STRL (GAUZE/BANDAGES/DRESSINGS) ×2 IMPLANT
GLENOSPHERE DELTA XTEND LAT 38 (Miscellaneous) ×1 IMPLANT
GLOVE BIOGEL PI IND STRL 7.5 (GLOVE) ×1 IMPLANT
GLOVE BIOGEL PI IND STRL 8.5 (GLOVE) ×1 IMPLANT
GLOVE BIOGEL PI INDICATOR 7.5 (GLOVE) ×1
GLOVE BIOGEL PI INDICATOR 8.5 (GLOVE) ×1
GLOVE ORTHO TXT STRL SZ7.5 (GLOVE) ×2 IMPLANT
GLOVE SURG ORTHO 8.5 STRL (GLOVE) ×2 IMPLANT
GOWN STRL REUS W/ TWL XL LVL3 (GOWN DISPOSABLE) ×2 IMPLANT
GOWN STRL REUS W/TWL XL LVL3 (GOWN DISPOSABLE) ×4
KIT BASIN OR (CUSTOM PROCEDURE TRAY) ×2 IMPLANT
KIT TURNOVER KIT A (KITS) ×1 IMPLANT
MANIFOLD NEPTUNE II (INSTRUMENTS) ×2 IMPLANT
METAGLENE DELTA EXTEND (Trauma) IMPLANT
METAGLENE DXTEND (Trauma) ×2 IMPLANT
NDL MA TROC 1/2 CIR (NEEDLE) IMPLANT
NDL MAYO CATGUT SZ4 TPR NDL (NEEDLE) ×1 IMPLANT
NEEDLE MA TROC 1/2 CIR (NEEDLE) ×2 IMPLANT
NEEDLE MAYO CATGUT SZ4 (NEEDLE) ×2 IMPLANT
NS IRRIG 1000ML POUR BTL (IV SOLUTION) ×2 IMPLANT
PACK SHOULDER (CUSTOM PROCEDURE TRAY) ×2 IMPLANT
PAD COLD SHLDR WRAP-ON (PAD) ×1 IMPLANT
PIN GUIDE 1.2 (PIN) ×1 IMPLANT
PIN GUIDE GLENOPHERE 1.5MX300M (PIN) ×1 IMPLANT
PIN METAGLENE 2.5 (PIN) ×1 IMPLANT
PROTECTOR NERVE ULNAR (MISCELLANEOUS) ×2 IMPLANT
RESTRAINT HEAD UNIVERSAL NS (MISCELLANEOUS) ×2 IMPLANT
SCREW 4.5X36MM (Screw) ×2 IMPLANT
SLING ARM FOAM STRAP LRG (SOFTGOODS) IMPLANT
SLING ARM FOAM STRAP MED (SOFTGOODS) ×1 IMPLANT
SMARTMIX MINI TOWER (MISCELLANEOUS)
SPACER 38 PLUS 3 (Spacer) ×1 IMPLANT
SPIKE FLUID TRANSFER (MISCELLANEOUS) ×2 IMPLANT
SPONGE T-LAP 4X18 ~~LOC~~+RFID (SPONGE) ×2 IMPLANT
STAPLER VISISTAT 35W (STAPLE) ×1 IMPLANT
STEM HUM PC SZ1 RT (Stem) ×1 IMPLANT
STEM HUMERAL SZ8 STANDARD (Stem) ×2 IMPLANT
STEM HUMERAL SZ8 STD (Stem) IMPLANT
STRIP CLOSURE SKIN 1/2X4 (GAUZE/BANDAGES/DRESSINGS) ×2 IMPLANT
SUCTION FRAZIER HANDLE 10FR (MISCELLANEOUS) ×2
SUCTION TUBE FRAZIER 10FR DISP (MISCELLANEOUS) ×1 IMPLANT
SUT FIBERWIRE #2 38 T-5 BLUE (SUTURE) ×8
SUT MNCRL AB 4-0 PS2 18 (SUTURE) ×2 IMPLANT
SUT VIC AB 0 CT1 36 (SUTURE) ×4 IMPLANT
SUT VIC AB 0 CT2 27 (SUTURE) ×2 IMPLANT
SUT VIC AB 2-0 CT1 27 (SUTURE) ×2
SUT VIC AB 2-0 CT1 TAPERPNT 27 (SUTURE) ×1 IMPLANT
SUTURE FIBERWR #2 38 T-5 BLUE (SUTURE) ×2 IMPLANT
TOWEL OR 17X26 10 PK STRL BLUE (TOWEL DISPOSABLE) ×2 IMPLANT
TOWER SMARTMIX MINI (MISCELLANEOUS) IMPLANT

## 2021-09-14 NOTE — Anesthesia Procedure Notes (Signed)
Procedure Name: Intubation Date/Time: 09/14/2021 3:19 PM  Performed by: Raenette Rover, CRNAPre-anesthesia Checklist: Patient identified, Emergency Drugs available, Suction available and Patient being monitored Patient Re-evaluated:Patient Re-evaluated prior to induction Oxygen Delivery Method: Circle system utilized Preoxygenation: Pre-oxygenation with 100% oxygen Induction Type: IV induction Ventilation: Mask ventilation without difficulty Laryngoscope Size: Mac and 3 Grade View: Grade I Tube type: Oral Tube size: 7.0 mm Number of attempts: 1 Airway Equipment and Method: Stylet Placement Confirmation: ETT inserted through vocal cords under direct vision, positive ETCO2 and breath sounds checked- equal and bilateral Secured at: 21 cm Tube secured with: Tape Dental Injury: Teeth and Oropharynx as per pre-operative assessment

## 2021-09-14 NOTE — Anesthesia Postprocedure Evaluation (Signed)
Anesthesia Post Note  Patient: Pamela Larson  Procedure(s) Performed: REVERSE SHOULDER ARTHROPLASTY (Right: Shoulder)     Patient location during evaluation: PACU Anesthesia Type: General Level of consciousness: awake and alert Pain management: pain level controlled Vital Signs Assessment: post-procedure vital signs reviewed and stable Respiratory status: spontaneous breathing, nonlabored ventilation, respiratory function stable and patient connected to nasal cannula oxygen Cardiovascular status: blood pressure returned to baseline and stable Postop Assessment: no apparent nausea or vomiting Anesthetic complications: no   No notable events documented.  Last Vitals:  Vitals:   09/14/21 1751 09/14/21 1800  BP: (!) 92/54 (!) 105/45  Pulse: 68 73  Resp: 17 15  Temp: (!) 36.3 C   SpO2: 99% 95%    Last Pain:  Vitals:   09/14/21 1800  TempSrc:   PainSc: 0-No pain                 Marsi Turvey S

## 2021-09-14 NOTE — Interval H&P Note (Signed)
History and Physical Interval Note:  09/14/2021 1:09 PM  Pamela Larson  has presented today for surgery, with the diagnosis of RIGHT SHOULDER FRACTURE.  The various methods of treatment have been discussed with the patient and family. After consideration of risks, benefits and other options for treatment, the patient has consented to  Procedure(s): REVERSE SHOULDER ARTHROPLASTY (Right) as a surgical intervention.  The patient's history has been reviewed, patient examined, no change in status, stable for surgery.  I have reviewed the patient's chart and labs.  Questions were answered to the patient's satisfaction.     Augustin Schooling

## 2021-09-14 NOTE — Anesthesia Preprocedure Evaluation (Signed)
Anesthesia Evaluation  Patient identified by MRN, date of birth, ID band Patient awake    Reviewed: Allergy & Precautions, NPO status , Patient's Chart, lab work & pertinent test results  Airway Mallampati: II  TM Distance: >3 FB Neck ROM: Full    Dental no notable dental hx.    Pulmonary COPD, former smoker,    Pulmonary exam normal breath sounds clear to auscultation       Cardiovascular negative cardio ROS Normal cardiovascular exam Rhythm:Regular Rate:Normal     Neuro/Psych Anxiety Depression negative neurological ROS     GI/Hepatic Neg liver ROS, GERD  ,  Endo/Other  Hypothyroidism   Renal/GU negative Renal ROS  negative genitourinary   Musculoskeletal  (+) Arthritis , Rheumatoid disorders,    Abdominal   Peds negative pediatric ROS (+)  Hematology negative hematology ROS (+)   Anesthesia Other Findings   Reproductive/Obstetrics negative OB ROS                             Anesthesia Physical Anesthesia Plan  ASA: 2  Anesthesia Plan: General   Post-op Pain Management: Regional block*   Induction: Intravenous  PONV Risk Score and Plan: 3 and Ondansetron, Dexamethasone and Treatment may vary due to age or medical condition  Airway Management Planned: Oral ETT  Additional Equipment:   Intra-op Plan:   Post-operative Plan: Extubation in OR  Informed Consent: I have reviewed the patients History and Physical, chart, labs and discussed the procedure including the risks, benefits and alternatives for the proposed anesthesia with the patient or authorized representative who has indicated his/her understanding and acceptance.     Dental advisory given  Plan Discussed with: CRNA and Surgeon  Anesthesia Plan Comments:         Anesthesia Quick Evaluation

## 2021-09-14 NOTE — Discharge Instructions (Signed)
Ice to the shoulder constantly.  Keep the incision covered and clean and dry for one week, then ok to get it wet in the shower and leave uncovered.   Do exercise as instructed hourly to prevent stiffness. Ok to use your hand for gentle activity right in front of you. DO not reach away from your body, behind your back, out to the side or overhead.   DO NOT reach behind your back or push up out of a chair with the operative arm.  Use a sling while you are up and around for comfort, may remove while seated.  Keep pillow propped behind the operative elbow so that your arm is resting across your waist.   Follow up with Dr Veverly Fells in two weeks in the office, call [phone] for appt

## 2021-09-14 NOTE — Transfer of Care (Signed)
Immediate Anesthesia Transfer of Care Note  Patient: Pamela Larson  Procedure(s) Performed: REVERSE SHOULDER ARTHROPLASTY (Right: Shoulder)  Patient Location: PACU  Anesthesia Type:GA combined with regional for post-op pain  Level of Consciousness: awake, alert , oriented and patient cooperative  Airway & Oxygen Therapy: Patient Spontanous Breathing and Patient connected to face mask oxygen  Post-op Assessment: Report given to RN and Post -op Vital signs reviewed and stable  Post vital signs: Reviewed and stable  Last Vitals:  Vitals Value Taken Time  BP 92/54 09/14/21 1751  Temp    Pulse 69 09/14/21 1753  Resp 17 09/14/21 1753  SpO2 98 % 09/14/21 1753  Vitals shown include unvalidated device data.  Last Pain:  Vitals:   09/14/21 1506  TempSrc:   PainSc: 0-No pain         Complications: No notable events documented.

## 2021-09-14 NOTE — Brief Op Note (Signed)
09/14/2021  5:43 PM  PATIENT:  Pamela Larson  76 y.o. female  PRE-OPERATIVE DIAGNOSIS:  RIGHT SHOULDER FRACTURE, displaced proximal humerus   POST-OPERATIVE DIAGNOSIS:  RIGHT SHOULDER FRACTURE, displaced proximal humerus fracture  PROCEDURE:  Procedure(s): REVERSE SHOULDER ARTHROPLASTY (Right)  DePuy Delta Xtend with Fracture EPI and repair of tuberosities  SURGEON:  Surgeon(s) and Role:    Netta Cedars, MD - Primary  PHYSICIAN ASSISTANT:   ASSISTANTS: Ventura Bruns PA-C   ANESTHESIA:   regional and general  EBL:  100 mL   BLOOD ADMINISTERED:none  DRAINS: none   LOCAL MEDICATIONS USED:  MARCAINE     SPECIMEN:  No Specimen  DISPOSITION OF SPECIMEN:  N/A  COUNTS:  YES  TOURNIQUET:  * No tourniquets in log *  DICTATION: .Other Dictation: Dictation Number [id number]  PLAN OF CARE: Admit for overnight observation  PATIENT DISPOSITION:  PACU - hemodynamically stable.   Delay start of Pharmacological VTE agent (>24hrs) due to surgical blood loss or risk of bleeding: no

## 2021-09-14 NOTE — Anesthesia Procedure Notes (Signed)
Anesthesia Regional Block: Interscalene brachial plexus block   Pre-Anesthetic Checklist: , timeout performed,  Correct Patient, Correct Site, Correct Laterality,  Correct Procedure, Correct Position, site marked,  Risks and benefits discussed,  Surgical consent,  Pre-op evaluation,  At surgeon's request and post-op pain management  Laterality: Right  Prep: chloraprep       Needles:  Injection technique: Single-shot  Needle Type: Echogenic Stimulator Needle     Needle Length: 9cm      Additional Needles:   Procedures:,,,, ultrasound used (permanent image in chart),,     Nerve Stimulator or Paresthesia:  Response: 0.4 mA  Additional Responses:   Narrative:  Start time: 09/14/2021 2:58 PM End time: 09/14/2021 3:07 PM Injection made incrementally with aspirations every 5 mL.  Performed by: Personally  Anesthesiologist: Myrtie Soman, MD  Additional Notes: Patient tolerated the procedure well without complications

## 2021-09-14 NOTE — Anesthesia Procedure Notes (Signed)
Anesthesia Procedure Image    

## 2021-09-14 NOTE — Op Note (Signed)
NAME: Pamela Larson, Pamela Larson MEDICAL RECORD NO: [id number] ACCOUNT NO: [id number] DATE OF BIRTH: 1945/04/27 FACILITY: [facility] LOCATION: [id number] PHYSICIAN: Almedia Balls. Ranell Patrick, MD  Operative Report   DATE OF PROCEDURE: 09/14/2021  PREOPERATIVE DIAGNOSIS:  Right displaced proximal humerus fracture, unstable.  POSTOPERATIVE DIAGNOSIS:  Right displaced proximal humerus fracture, unstable.  PROCEDURE PERFORMED:  Right reverse shoulder replacement using DePuy Delta Xtend prosthesis with fracture epi and repair of tuberosities.  ATTENDING SURGEON:  Almedia Balls. Ranell Patrick, MD  ASSISTANT:  Konrad Felix Dixon, [place], who was scrubbed during the entire procedure, and necessary for satisfactory completion of surgery.  ANESTHESIA:  General anesthesia was used plus interscalene block.  ESTIMATED BLOOD LOSS:  200 mL.  FLUID REPLACEMENT:  1500 mL crystalloid.  COUNTS:  Instrument counts was correct.  COMPLICATIONS:  No complications.  ANTIBIOTICS:  Perioperative antibiotics were given.  INDICATIONS:  The patient is a 76 year old female who suffered a fall injuring her right shoulder.  The patient presented with a displaced proximal humerus fracture with medial translation of the humeral shaft relative to the head and also displacement  of the greater tuberosity.  Initially, the fracture alignment looked acceptable with about a third of the shaft displaced medially.  Unfortunately, over the course of the last week, the patient's shaft displaced even further, greater than 50% of the  width of the shaft medially and it appeared the greater tuberosity was displaced as well. Due to the unstable nature of the fracture, continued severe pain, we discussed options with the patient, recommending conversion to reverse shoulder replacement.   The patient agreed.  Informed consent obtained.  DESCRIPTION OF PROCEDURE:  After an adequate level of anesthesia was achieved, the patient was positioned in modified beach chair  position.  Right shoulder correctly identified and sterilely prepped and draped in the usual manner.  Timeout called,  verifying correct patient, correct site. We entered the patient's shoulder using standard deltopectoral approach, starting at the coracoid process extending down the anterior humerus.  Dissection down through subcutaneous tissues using Bovie.  We  identified the cephalic vein and took that laterally with the deltoid.  Pectoralis was taken medially.  Conjoined tendon identified and retracted medially.  Biceps tenodesed in situ with 0 Vicryl suture, figure-of-eight x2.  We then used the Bovie and  divided the soft tissue overlying the biceps groove, placed a 3/4 inch curved osteotome in the biceps groove and osteotomized the lesser tuberosity off the humeral head.  We debulked the tuberosity with rongeur, getting to a nice thin wafer of bone with  the attached tendon.  We then placed #2 FiberWire suture x2 medial to the lesser tuberosity in the subscapularis with a suture limbs coming out superficial to the subscap.  We then went to the junction between the humeral head and the greater tuberosity  and I placed a T-handle Crego elevator and then we used an osteotome to gently break the greater tuberosity free from the humeral head.  Once we did that, we debulked the greater tuberosity as well, removed the humeral head to the back table and we kept  that for bone graft.  Next, we placed #2 FiberWire suture lateral to the greater tuberosity in the rotator cuff tendon x3 with 3 good mattress sutures grabbing tendon with suture limbs coming out superficial to the bone.  With the head removed, we had  good exposure of the glenoid face.  We went ahead and removed the labrum and then the cartilage on the glenoid surface.  The  patient had a very diminutive glenoid, but we felt it was taken out to support a baseplate.  Thus, we went ahead and placed our  guide pin centered on the glenoid and angled  slightly inferiorly.  We reamed down to subchondral bone.  We then did our peripheral hand reaming and then drilled out our central peg hole.  We impacted the HA coated metaglene baseplate onto the glenoid and  with that seated firmly down on bone, we placed a 42 screw inferiorly, a 36 screw superiorly.  So those 2 screws in place, we had excellent baseplate security.  We locked the screws and then went ahead and placed a 38 + 0 standard glenosphere onto the  baseplate.  We secured it with our screwdriver.  We made sure that there was no soft tissue caught in that junction between the baseplate and the glenosphere.  We then went back to the humeral side.  We went ahead and prepared the humeral shaft reaming  from a 6 mm reamer, reaming up to a size 8. We could not get the 10 down and thus, we stuck with the 8.  We then trialled with the 8 stem and this was a fracture epi and that was set on the 0 setting and placed in 20 degrees of retroversion.  With that  pushed down into the canal at the appropriate version of about 20 degrees of retroversion, we selected a 38+3 poly and placed that on the humeral tray and reduced the shoulder.  We were just about perfect with our length on the humeral side, good tension  and I was able to get the tuberosities reduced.  We removed all the trial components from the humeral side.  We irrigated thoroughly.  I then went ahead and drilled two holes in the shaft anteriorly and placed #2 FiberWire suture x2, so a 4 suture limbs  coming out anteriorly.  We then went ahead and selected the real Porocoat 8 stem and the Porocoat fracture epi one right and we attached that on the 0 setting and then we used cement.  We felt like the patient's canal was basically ____.  There was not  really any metaphyseal bone in the canal, so we decided to go ahead and cement the stem distally. So, we vacuum mixed high viscosity cement and then hand packed it on the canal and then inserted the stem  to the appropriate depth with the appropriate  version.  I also had an around-the-world suture through one of the suture holes on the stem and have them going around the back of the humeral component.  So with the stem in place at the appropriate version and to the appropriate depth, we allowed the  cement to harden. We then went ahead and selected the real 38+3 poly impacted on the humeral tray and then reduced the shoulder.  We were extremely happy with our soft tissue tensioning.  We then irrigated thoroughly.  I placed around-the-world stitch  lateral to the greater tuberosity and medial to the lesser tuberosity first.  We then went ahead and did some side-to-side sutures in the rotator interval.  We bone grafted extensively around the porous coated proximal portion of the implant on the  humeral side and then we sewed the tuberosities to each other with those mattress sutures.  Those were basically anatomically positioned.  We also did the rotator interval sutures, tied those and then we did shaft to tuberosity fixation with those shaft  sutures.  We had drill holes medial to the lesser tuberosity, lateral to the greater tuberosity, so we had shaft to tuberosity fixation on both the lesser and the greater and then we finally finished up with tying around-the-world suture.  We compressed  that down to the bone nicely and tied that. One more FiberWire was used right up around the rotator interval greater tuberosity junction just to make it watertight.  Everything was moving together as a unit and did not restrict motion at all.  We could  externally rotate about 45-50 degrees, we could internally rotate to the abdomen and forward elevate greater than 120 degrees.  We irrigated again and then repaired the deltopectoral interval with 0 Vicryl suture followed by 2-0 Vicryl for subcutaneous  closure and staples for skin.  Sterile dressing applied.  The patient transported to recovery room in stable  condition.   NIK D: 09/14/2021 5:53:24 pm T: 09/14/2021 10:48:00 pm  JOB: [id number]/ [id number]

## 2021-09-15 DIAGNOSIS — M810 Age-related osteoporosis without current pathological fracture: Secondary | ICD-10-CM | POA: Diagnosis present

## 2021-09-15 DIAGNOSIS — J449 Chronic obstructive pulmonary disease, unspecified: Secondary | ICD-10-CM | POA: Diagnosis present

## 2021-09-15 DIAGNOSIS — W19XXXA Unspecified fall, initial encounter: Secondary | ICD-10-CM | POA: Diagnosis present

## 2021-09-15 DIAGNOSIS — S42211A Unspecified displaced fracture of surgical neck of right humerus, initial encounter for closed fracture: Secondary | ICD-10-CM | POA: Diagnosis present

## 2021-09-15 DIAGNOSIS — S42251A Displaced fracture of greater tuberosity of right humerus, initial encounter for closed fracture: Secondary | ICD-10-CM | POA: Diagnosis present

## 2021-09-15 DIAGNOSIS — M069 Rheumatoid arthritis, unspecified: Secondary | ICD-10-CM | POA: Diagnosis present

## 2021-09-15 DIAGNOSIS — Z79890 Hormone replacement therapy (postmenopausal): Secondary | ICD-10-CM | POA: Diagnosis not present

## 2021-09-15 DIAGNOSIS — Z87442 Personal history of urinary calculi: Secondary | ICD-10-CM | POA: Diagnosis not present

## 2021-09-15 DIAGNOSIS — E039 Hypothyroidism, unspecified: Secondary | ICD-10-CM | POA: Diagnosis present

## 2021-09-15 DIAGNOSIS — R5382 Chronic fatigue, unspecified: Secondary | ICD-10-CM | POA: Diagnosis present

## 2021-09-15 DIAGNOSIS — K219 Gastro-esophageal reflux disease without esophagitis: Secondary | ICD-10-CM | POA: Diagnosis present

## 2021-09-15 DIAGNOSIS — M797 Fibromyalgia: Secondary | ICD-10-CM | POA: Diagnosis present

## 2021-09-15 DIAGNOSIS — Z79899 Other long term (current) drug therapy: Secondary | ICD-10-CM | POA: Diagnosis not present

## 2021-09-15 DIAGNOSIS — Z87891 Personal history of nicotine dependence: Secondary | ICD-10-CM | POA: Diagnosis not present

## 2021-09-15 DIAGNOSIS — Z881 Allergy status to other antibiotic agents status: Secondary | ICD-10-CM | POA: Diagnosis not present

## 2021-09-15 DIAGNOSIS — Z885 Allergy status to narcotic agent status: Secondary | ICD-10-CM | POA: Diagnosis not present

## 2021-09-15 LAB — HEMOGLOBIN AND HEMATOCRIT, BLOOD
HCT: 28.4 % — ABNORMAL LOW (ref 36.0–46.0)
Hemoglobin: 9.2 g/dL — ABNORMAL LOW (ref 12.0–15.0)

## 2021-09-15 LAB — BASIC METABOLIC PANEL
Anion gap: 8 (ref 5–15)
BUN: 11 mg/dL (ref 8–23)
CO2: 25 mmol/L (ref 22–32)
Calcium: 8.3 mg/dL — ABNORMAL LOW (ref 8.9–10.3)
Chloride: 105 mmol/L (ref 98–111)
Creatinine, Ser: 0.62 mg/dL (ref 0.44–1.00)
GFR, Estimated: >60 mL/min (ref >60)
Glucose, Bld: 113 mg/dL — ABNORMAL HIGH (ref 70–99)
Potassium: 4.1 mmol/L (ref 3.5–5.1)
Sodium: 138 mmol/L (ref 135–145)

## 2021-09-15 NOTE — Progress Notes (Addendum)
   Subjective: 1 Day Post-Op Procedure(s) (LRB): REVERSE SHOULDER ARTHROPLASTY (Right) Patient reports pain as mild.   Patient seen in rounds for Dr. Veverly Fells. Patient is doing well this AM, reports pain is currently well controlled on Norco regimen. Denies chest pain or SOB. No acute events overnight.  Plan is to go Home after hospital stay.  Objective: Vital signs in last 24 hours: Temp:  [97.4 F (36.3 C)-98.7 F (37.1 C)] 97.8 F (36.6 C) (08/05 0431) Pulse Rate:  [59-101] 101 (08/05 0431) Resp:  [13-18] 18 (08/05 0431) BP: (92-145)/(45-95) 111/56 (08/05 0431) SpO2:  [93 %-100 %] 95 % (08/05 0431) Weight:  [47.6 kg] 47.6 kg (08/04 1321)  Intake/Output from previous day:  Intake/Output Summary (Last 24 hours) at 09/15/2021 0757 Last data filed at 09/15/2021 0724 Gross per 24 hour  Intake 2661.8 ml  Output 1400 ml  Net 1261.8 ml    Intake/Output this shift: Total I/O In: 129 [I.V.:129] Out: -   Labs: Recent Labs    09/14/21 1243 09/15/21 0414  HGB 11.3* 9.2*   Recent Labs    09/14/21 1243 09/15/21 0414  WBC 5.3  --   RBC 3.30*  --   HCT 35.8* 28.4*  PLT 448*  --    Recent Labs    09/14/21 1243 09/15/21 0414  NA 139 138  K 4.1 4.1  CL 109 105  CO2 23 25  BUN 11 11  CREATININE 0.50 0.62  GLUCOSE 89 113*  CALCIUM 9.2 8.3*   No results for input(s): "LABPT", "INR" in the last 72 hours.  Exam: General - Patient is Alert and Oriented Extremity - Neurologically intact Neurovascular intact Intact pulses distally Dressing/Incision - clean, dry, no drainage Motor Function - returning. Able to wiggle fingers. No wrist flexion, no sensation to touch.  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   Weight as of this encounter: 47.6 kg.  Will work with therapy  Possible discharge today versus tomorrow pending progress with therapy.  Follow-up with Dr. Veverly Fells in 2 weeks.   Theresa Duty, PA-C Orthopedic Surgery [phone] 09/15/2021, 7:57 AM  I have seen the patient and agree with the above assessment and plan. The patient lives alone with limited support. She may need another night here with supervision and more therapy.   Esmond Plants MD

## 2021-09-15 NOTE — Plan of Care (Signed)
  Problem: Coping: Goal: Level of anxiety will decrease Outcome: Progressing   Problem: Pain Managment: Goal: General experience of comfort will improve Outcome: Progressing   Problem: Safety: Goal: Ability to remain free from injury will improve Outcome: Progressing   

## 2021-09-15 NOTE — Evaluation (Signed)
Occupational Therapy Evaluation Patient Details Name: Pamela Larson MRN: [id number] DOB: [birth year] Today's Date: 09/15/2021   History of Present Illness Patient is a 76 year old female who presented to the hospital with an unstable right displaced proximal humerus fracture. patient underwent a right reverse shoulder replacement.  PMH: anemia, anxiety, COPD, depression, GERD, RA, osteoporosis, cervical disc arthroplasty 2018, lumbar laminectomy/decompression   Clinical Impression   Patient is a 76 year old female who was admitted for above. Patient lives at home in senior living with various friends and daughter support in next level of care. Patient was educated on ice cuff use, positioning of RUE, exercises for RUE, bathing BUE, sling donning/doffing and proper positioning. Patient verbalized and demonstrated understanding of education. Patient was min guard in room during dynamic tasks with cues for safety. Patient plans to stay one additional day in the hospital prior to transitioning home.       Recommendations for follow up therapy are one component of a multi-disciplinary discharge planning process, led by the attending physician.  Recommendations may be updated based on patient status, additional functional criteria and insurance authorization.   Follow Up Recommendations  Follow physician's recommendations for discharge plan and follow up therapies    Assistance Recommended at Discharge Intermittent Supervision/Assistance  Patient can return home with the following Assistance with cooking/housework;Assist for transportation;Direct supervision/assist for medications management;Help with stairs or ramp for entrance    Functional Status Assessment  Patient has had a recent decline in their functional status and demonstrates the ability to make significant improvements in function in a reasonable and predictable amount of time.  Equipment Recommendations  None recommended by OT     Recommendations for Other Services       Precautions / Restrictions Precautions Precautions: Shoulder Type of Shoulder Precautions: NO ROM shoulder, OK for hand wrist and elbow Precaution Booklet Issued: Yes (comment) (handout) Restrictions Weight Bearing Restrictions: Yes RUE Weight Bearing: Non weight bearing      Mobility Bed Mobility Overal bed mobility: Needs Assistance Bed Mobility: Supine to Sit     Supine to sit: Min assist, HOB elevated     General bed mobility comments: with increased time and cues not to lean through RUE    Transfers                          Balance Overall balance assessment: Mild deficits observed, not formally tested                                         ADL either performed or assessed with clinical judgement   ADL Overall ADL's : Needs assistance/impaired Eating/Feeding: Set up;Sitting   Grooming: Set up;Sitting;Wash/dry face;Wash/dry hands   Upper Body Bathing: Moderate assistance;Sitting Upper Body Bathing Details (indicate cue type and reason): in recliner Lower Body Bathing: Minimal assistance;Sit to/from stand Lower Body Bathing Details (indicate cue type and reason): cues to wsh from front to back Upper Body Dressing : Moderate assistance;Sitting   Lower Body Dressing: Minimal assistance;Sit to/from stand   Toilet Transfer: Magazine features editor Details (indicate cue type and reason): with no AD with cues for line management. Toileting- Clothing Manipulation and Hygiene: Min guard Toileting - Clothing Manipulation Details (indicate cue type and reason): cues to wash from front to back     Functional mobility during ADLs: Supervision/safety  Vision Patient Visual Report: No change from baseline       Perception     Praxis      Pertinent Vitals/Pain Pain Assessment Pain Assessment: Faces Faces Pain Scale: Hurts little more Pain Location: R shoulder Pain Descriptors /  Indicators: Other (Comment), Grimacing, Discomfort, Operative site guarding (tingling) Pain Intervention(s): Limited activity within patient's tolerance, Monitored during session, Patient requesting pain meds-RN notified, Repositioned     Hand Dominance Right   Extremity/Trunk Assessment Upper Extremity Assessment Upper Extremity Assessment: RUE deficits/detail RUE Deficits / Details: patient reported feeling numbness in UE still. able to wiggle digits minimally with some tingliness at end of session. no ROM of shoulder per MD orders post op   Lower Extremity Assessment Lower Extremity Assessment: Overall WFL for tasks assessed   Cervical / Trunk Assessment Cervical / Trunk Assessment: Normal   Communication     Cognition Arousal/Alertness: Awake/alert Behavior During Therapy: WFL for tasks assessed/performed Overall Cognitive Status: Within Functional Limits for tasks assessed                                 General Comments: plesant but impulsive at times during session     General Comments       Exercises     Shoulder Instructions Shoulder Instructions Donning/doffing shirt without moving shoulder: Minimal assistance Method for sponge bathing under operated UE: Patient able to independently direct caregiver Donning/doffing sling/immobilizer: Minimal assistance Correct positioning of sling/immobilizer: Minimal assistance ROM for elbow, wrist and digits of operated UE: Modified independent (demonstrated on LUE) Sling wearing schedule (on at all times/off for ADL's): Modified independent    Home Living Family/patient expects to be discharged to:: Private residence Living Arrangements: Alone Available Help at Discharge: Friend(s);Available PRN/intermittently Type of Home: House                                  Prior Functioning/Environment Prior Level of Function : Independent/Modified Independent                        OT  Problem List: Decreased activity tolerance;Impaired balance (sitting and/or standing);Pain;Impaired UE functional use;Decreased knowledge of precautions;Decreased knowledge of use of DME or AE;Decreased safety awareness      OT Treatment/Interventions: Self-care/ADL training;Therapeutic exercise;Neuromuscular education;Energy conservation;DME and/or AE instruction;Therapeutic activities;Balance training;Patient/family education    OT Goals(Current goals can be found in the care plan section) Acute Rehab OT Goals Patient Stated Goal: to get feeling in RUE OT Goal Formulation: With patient Time For Goal Achievement: 09/29/21 Potential to Achieve Goals: Fair  OT Frequency: Min 2X/week    Co-evaluation              AM-PAC OT "6 Clicks" Daily Activity     Outcome Measure Help from another person eating meals?: A Little Help from another person taking care of personal grooming?: A Little Help from another person toileting, which includes using toliet, bedpan, or urinal?: A Little Help from another person bathing (including washing, rinsing, drying)?: A Little Help from another person to put on and taking off regular upper body clothing?: A Little Help from another person to put on and taking off regular lower body clothing?: A Little 6 Click Score: 18   End of Session Nurse Communication: Mobility status  Activity Tolerance: Patient tolerated treatment well Patient left: in chair;with call  bell/phone within reach  OT Visit Diagnosis: Unsteadiness on feet (R26.81);Other abnormalities of gait and mobility (R26.89);Pain Pain - Right/Left: Right Pain - part of body: Shoulder                Time: 2897-9150 OT Time Calculation (min): 44 min Charges:  OT General Charges $OT Visit: 1 Visit OT Evaluation $OT Eval Low Complexity: 1 Low OT Treatments $Self Care/Home Management : 23-37 mins  Jackelyn Poling OTR/L, MS Acute Rehabilitation Department Office# [phone] Pager#  [phone]   Marcellina Millin 09/15/2021, 10:04 AM

## 2021-09-16 DIAGNOSIS — S42201A Unspecified fracture of upper end of right humerus, initial encounter for closed fracture: Secondary | ICD-10-CM | POA: Diagnosis present

## 2021-09-16 NOTE — Plan of Care (Signed)
  Problem: Education: Goal: Knowledge of General Education information will improve Description: Including pain rating scale, medication(s)/side effects and non-pharmacologic comfort measures 09/16/2021 1245 by Olen Cordial, RN Outcome: Completed/Met 09/16/2021 0718 by Olen Cordial, RN Outcome: Progressing   Problem: Activity: Goal: Risk for activity intolerance will decrease 09/16/2021 1245 by Olen Cordial, RN Outcome: Completed/Met 09/16/2021 0718 by Olen Cordial, RN Outcome: Progressing   Problem: Elimination: Goal: Will not experience complications related to bowel motility 09/16/2021 1245 by Olen Cordial, RN Outcome: Completed/Met 09/16/2021 0718 by Olen Cordial, RN Outcome: Progressing

## 2021-09-16 NOTE — TOC CM/SW Note (Signed)
  Transition of Care [facility]) Screening Note   Patient Details  Name: Pamela Larson Date of Birth: 03/24/1945   Transition of Care [facility]) CM/SW Contact:    Ross Ludwig, LCSW Phone Number: 09/16/2021, 11:58 AM    Transition of Care Department [facility]) has reviewed patient and no TOC needs have been identified at this time. We will continue to monitor patient advancement through interdisciplinary progression rounds. If new patient transition needs arise, please place a TOC consult.

## 2021-09-16 NOTE — Progress Notes (Signed)
Occupational Therapy Treatment Patient Details Name: Pamela Larson MRN: [id number] DOB: 10/30/1945 Today's Date: 09/16/2021   History of present illness Patient is a 76 year old female who presented to the hospital with an unstable right displaced proximal humerus fracture. patient underwent a right reverse shoulder replacement.  PMH: anemia, anxiety, COPD, depression, GERD, RA, osteoporosis, cervical disc arthroplasty 2018, lumbar laminectomy/decompression   OT comments  Therapist provided education and instruction to patient in regards to exercises, precautions, positioning, donning upper extremity clothing and bathing while maintaining shoulder precautions, ice and edema management and donning/doffing sling. Patient verbalized understanding and demonstrated as needed. Patient needed assistance to donn shirt, underwear, pants, socks and shoes and provided with instruction on compensatory strategies to perform ADLs. Patient to follow up with MD for further therapy needs.     Recommendations for follow up therapy are one component of a multi-disciplinary discharge planning process, led by the attending physician.  Recommendations may be updated based on patient status, additional functional criteria and insurance authorization.    Follow Up Recommendations  Follow physician's recommendations for discharge plan and follow up therapies    Assistance Recommended at Discharge Intermittent Supervision/Assistance  Patient can return home with the following  Assistance with cooking/housework;Assist for transportation;Direct supervision/assist for medications management;Help with stairs or ramp for entrance   Equipment Recommendations  None recommended by OT    Recommendations for Other Services      Precautions / Restrictions Precautions Precautions: Shoulder Type of Shoulder Precautions: NO ROM shoulder, OK for hand wrist and elbow Precaution Booklet Issued: Yes  (comment) Restrictions Weight Bearing Restrictions: Yes RUE Weight Bearing: Non weight bearing       Mobility Bed Mobility Overal bed mobility: Modified Independent             General bed mobility comments: patient attempted to reach for therapist to pull up on with education provided on not having therapist at home and needing to push up from surface for best body mechanics with LUE. patient verbalized and demonstrated understanding.    Transfers                         Balance Overall balance assessment: No apparent balance deficits (not formally assessed)                                         ADL either performed or assessed with clinical judgement   ADL Overall ADL's : Needs assistance/impaired           Upper Body Bathing Details (indicate cue type and reason): patient was demonstrated through process with shirt in place. patient asked" would i do this without my shirt on at home?" patient was again educated taht this was simulated activity to demonstrate how someone would see saw a washcloth under your arm for bathing at home as per patient request after already dressed. Lower Body Bathing: Min guard   Upper Body Dressing : Supervision/safety Upper Body Dressing Details (indicate cue type and reason): with cues to use UE from elbow down for tasks. Lower Body Dressing: Min guard;Sit to/from stand   Toilet Transfer: Modified Programmer, applications Details (indicate cue type and reason): no AD with no LOB Toileting- Clothing Manipulation and Hygiene: Modified independent       Functional mobility during ADLs: Modified independent General ADL Comments: in room  Extremity/Trunk Assessment Upper Extremity Assessment RUE Deficits / Details: patient is able to control forearm, wrist and hand today with reports of numbness going away and knowing where UE is without looking   Lower Extremity Assessment Lower Extremity Assessment:  Overall WFL for tasks assessed        Vision       Perception     Praxis      Cognition Arousal/Alertness: Awake/alert Behavior During Therapy: WFL for tasks assessed/performed Overall Cognitive Status: Within Functional Limits for tasks assessed                                 General Comments: plesant but impulsive at times during session        Exercises      Shoulder Instructions Shoulder Instructions Donning/doffing shirt without moving shoulder: Supervision/safety Method for sponge bathing under operated UE: Patient able to independently direct caregiver Donning/doffing sling/immobilizer: Supervision/safety Correct positioning of sling/immobilizer: Supervision/safety ROM for elbow, wrist and digits of operated UE: Modified independent Sling wearing schedule (on at all times/off for ADL's): Modified independent Proper positioning of operated UE when showering: Supervision/safety Positioning of UE while sleeping: Supervision/safety     General Comments      Pertinent Vitals/ Pain       Pain Assessment Pain Assessment: Faces Faces Pain Scale: Hurts a little bit Pain Location: R shoulder Pain Descriptors / Indicators: Other (Comment), Grimacing, Discomfort, Operative site guarding Pain Intervention(s): Limited activity within patient's tolerance, Monitored during session, Premedicated before session, Repositioned  Home Living                                          Prior Functioning/Environment              Frequency  Min 2X/week        Progress Toward Goals  OT Goals(current goals can now be found in the care plan section)  Progress towards OT goals: Progressing toward goals     Plan Discharge plan remains appropriate    Co-evaluation                 AM-PAC OT "6 Clicks" Daily Activity     Outcome Measure   Help from another person eating meals?: None Help from another person taking care of  personal grooming?: A Little Help from another person toileting, which includes using toliet, bedpan, or urinal?: None Help from another person bathing (including washing, rinsing, drying)?: A Little Help from another person to put on and taking off regular upper body clothing?: A Little Help from another person to put on and taking off regular lower body clothing?: None 6 Click Score: 21    End of Session    OT Visit Diagnosis: Unsteadiness on feet (R26.81);Other abnormalities of gait and mobility (R26.89);Pain Pain - Right/Left: Right Pain - part of body: Shoulder   Activity Tolerance Patient tolerated treatment well   Patient Left in chair;with call bell/phone within reach   Nurse Communication Mobility status        Time: 2637-8588 OT Time Calculation (min): 48 min  Charges: OT General Charges $OT Visit: 1 Visit OT Treatments $Self Care/Home Management : 38-52 mins  Jackelyn Poling OTR/L, MS Acute Rehabilitation Department Office# [phone] Pager# [phone]   Marcellina Millin 09/16/2021, 9:56 AM

## 2021-09-16 NOTE — Progress Notes (Signed)
Occupational Therapy Treatment Patient Details Name: Pamela Larson MRN: [id number] DOB: 06/12/1945 Today's Date: 09/16/2021   History of present illness Patient is a 76 year old female who presented to the hospital with an unstable right displaced proximal humerus fracture. patient underwent a right reverse shoulder replacement.  PMH: anemia, anxiety, COPD, depression, GERD, RA, osteoporosis, cervical disc arthroplasty 2018, lumbar laminectomy/decompression   OT comments  s/p shoulder replacement without functional use of right dominant upper extremity secondary to effects of surgery and interscalene block and shoulder precautions. Therapist provided education and instruction to patient and daughter in regards to exercises, precautions, positioning, donning upper extremity clothing and bathing while maintaining shoulder precautions, ice and edema management and donning/doffing sling. Patient and daughter verbalized understanding and demonstrated as needed.patients daughter plans to stay with patient during session. Patient to follow up with MD for further therapy needs.     Recommendations for follow up therapy are one component of a multi-disciplinary discharge planning process, led by the attending physician.  Recommendations may be updated based on patient status, additional functional criteria and insurance authorization.    Follow Up Recommendations  Follow physician's recommendations for discharge plan and follow up therapies    Assistance Recommended at Discharge Intermittent Supervision/Assistance  Patient can return home with the following  Assistance with cooking/housework;Assist for transportation;Direct supervision/assist for medications management;Help with stairs or ramp for entrance   Equipment Recommendations  None recommended by OT    Recommendations for Other Services      Precautions / Restrictions Precautions Precautions: Shoulder Type of Shoulder Precautions: NO ROM  shoulder, OK for hand wrist and elbow Precaution Booklet Issued: Yes (comment) Restrictions Weight Bearing Restrictions: Yes RUE Weight Bearing: Non weight bearing       Mobility Bed Mobility Overal bed mobility: Modified Independent             General bed mobility comments: patient attempted to reach for therapist to pull up on with education provided on not having therapist at home and needing to push up from surface for best body mechanics with LUE. patient verbalized and demonstrated understanding.    Transfers                         Balance Overall balance assessment: No apparent balance deficits (not formally assessed)                                         ADL either performed or assessed with clinical judgement   ADL Overall ADL's : Needs assistance/impaired           Upper Body Bathing Details (indicate cue type and reason): patient was demonstrated through process with shirt in place. patient asked" would i do this without my shirt on at home?" patient was again educated taht this was simulated activity to demonstrate how someone would see saw a washcloth under your arm for bathing at home as per patient request after already dressed. Lower Body Bathing: Min guard   Upper Body Dressing : Supervision/safety Upper Body Dressing Details (indicate cue type and reason): supervisin to don/doff sling with daughter present Lower Body Dressing: Min guard;Sit to/from stand   Toilet Transfer: Modified Programmer, applications Details (indicate cue type and reason): no AD with no LOB Toileting- Clothing Manipulation and Hygiene: Modified independent       Functional mobility during ADLs:  Modified independent General ADL Comments: in room    Extremity/Trunk Assessment Upper Extremity Assessment RUE Deficits / Details: patient is able to control forearm, wrist and hand today with reports of numbness going away and knowing where UE is  without looking   Lower Extremity Assessment Lower Extremity Assessment: Overall WFL for tasks assessed        Vision       Perception     Praxis      Cognition Arousal/Alertness: Awake/alert Behavior During Therapy: WFL for tasks assessed/performed Overall Cognitive Status: Within Functional Limits for tasks assessed                                 General Comments: patient continues to be impulsive during session with daughter present for education at this time        Exercises      Shoulder Instructions Shoulder Instructions Donning/doffing shirt without moving shoulder: Caregiver independent with task Method for sponge bathing under operated UE: Caregiver independent with task Donning/doffing sling/immobilizer: Caregiver independent with task Correct positioning of sling/immobilizer: Caregiver independent with task Pendulum exercises (written home exercise program): Modified independent ROM for elbow, wrist and digits of operated UE: Modified independent Sling wearing schedule (on at all times/off for ADL's): Modified independent Proper positioning of operated UE when showering: Caregiver independent with task;Modified independent Positioning of UE while sleeping: Caregiver independent with task     General Comments      Pertinent Vitals/ Pain       Pain Assessment Pain Assessment: No/denies pain Faces Pain Scale: Hurts a little bit Pain Location: R shoulder Pain Descriptors / Indicators: Other (Comment), Grimacing, Discomfort, Operative site guarding Pain Intervention(s): Limited activity within patient's tolerance, Monitored during session, Premedicated before session, Repositioned  Home Living                                          Prior Functioning/Environment              Frequency  Min 2X/week        Progress Toward Goals  OT Goals(current goals can now be found in the care plan section)  Progress  towards OT goals: Progressing toward goals     Plan Discharge plan remains appropriate    Co-evaluation                 AM-PAC OT "6 Clicks" Daily Activity     Outcome Measure   Help from another person eating meals?: None Help from another person taking care of personal grooming?: None Help from another person toileting, which includes using toliet, bedpan, or urinal?: None Help from another person bathing (including washing, rinsing, drying)?: A Little Help from another person to put on and taking off regular upper body clothing?: A Little Help from another person to put on and taking off regular lower body clothing?: None 6 Click Score: 22    End of Session Equipment Utilized During Treatment: Other (comment) (sling)  OT Visit Diagnosis: Unsteadiness on feet (R26.81);Other abnormalities of gait and mobility (R26.89);Pain Pain - Right/Left: Right Pain - part of body: Shoulder   Activity Tolerance Patient tolerated treatment well   Patient Left in chair;with call bell/phone within reach;with family/visitor present   Nurse Communication Mobility status        Time: 1203-1224 OT  Time Calculation (min): 21 min  Charges: OT General Charges $OT Visit: 1 Visit OT Treatments $Self Care/Home Management : 38-52 mins $Therapeutic Activity: 8-22 mins  Jackelyn Poling OTR/L, MS Acute Rehabilitation Department Office# [phone] Pager# [phone]   Marcellina Millin 09/16/2021, 12:30 PM

## 2021-09-16 NOTE — Plan of Care (Signed)
  Problem: Education: Goal: Knowledge of General Education information will improve Description: Including pain rating scale, medication(s)/side effects and non-pharmacologic comfort measures Outcome: Progressing   Problem: Activity: Goal: Risk for activity intolerance will decrease Outcome: Progressing   Problem: Pain Managment: Goal: General experience of comfort will improve Outcome: Progressing   

## 2021-09-16 NOTE — Progress Notes (Signed)
    Subjective: 2 Day Post-Op Procedure(s) (LRB): REVERSE SHOULDER ARTHROPLASTY (Right) Patient seen in rounds for Dr. Veverly Fells.  She is doing well this morning. Patient reports pain as mild to moderate and well controlled. Denies N/V/CP/SOB/Abd pain. She is ready to go home.   Objective:   VITALS:   Vitals:   09/15/21 1422 09/15/21 1721 09/15/21 2058 09/16/21 0645  BP: (!) 108/58 127/69 127/62 107/77  Pulse: 80 67 77 77  Resp: '18 20 18 18  '$ Temp: 98.3 F (36.8 C) 98.1 F (36.7 C) 98 F (36.7 C) 98.2 F (36.8 C)  TempSrc: Oral Oral    SpO2:  94% 97% 95%  Weight: 47.7 kg     Height: 5' (1.524 m)       Patient lying comfortably in bed. NAD.  Extremity - Neurologically intact Neurovascular intact Intact pulses distally Dressing/Incision - clean, dry, no drainage Motor Function -  Able to wiggle fingers and move wrist. Sensation intact.   Lab Results  Component Value Date   WBC 5.3 09/14/2021   HGB 9.2 (L) 09/15/2021   HCT 28.4 (L) 09/15/2021   MCV 108.5 (H) 09/14/2021   PLT 448 (H) 09/14/2021   BMET    Component Value Date/Time   NA 138 09/15/2021 0414   K 4.1 09/15/2021 0414   CL 105 09/15/2021 0414   CO2 25 09/15/2021 0414   GLUCOSE 113 (H) 09/15/2021 0414   BUN 11 09/15/2021 0414   CREATININE 0.62 09/15/2021 0414   CALCIUM 8.3 (L) 09/15/2021 0414   GFRNONAA >60 09/15/2021 0414     Assessment/Plan: 2 Days Post-Op   Principal Problem:   S/P shoulder replacement, right Active Problems:   Closed fracture of right proximal humerus   Discharge today Follow-up with Dr. Veverly Fells in 2 weeks   Charlott Rakes, PA-C 09/16/2021, 11:24 AM  [facility]  [place] [address], [place], [place], [place] [address] Phone: [phone]

## 2021-09-17 ENCOUNTER — Other Ambulatory Visit: Payer: Self-pay | Admitting: Allergy and Immunology

## 2021-09-17 ENCOUNTER — Encounter (HOSPITAL_COMMUNITY): Payer: Self-pay | Admitting: Orthopedic Surgery

## 2021-09-19 DIAGNOSIS — Z4789 Encounter for other orthopedic aftercare: Secondary | ICD-10-CM | POA: Diagnosis not present

## 2021-09-19 NOTE — Discharge Summary (Signed)
Physician Discharge Summary  Patient ID: Pamela Larson MRN: [id number] DOB/AGE: 1945-10-22 76 y.o.  Admit date: 09/14/2021 Discharge date: 09/16/2021  Admission Diagnoses:  S/P shoulder replacement, right  Discharge Diagnoses:  Principal Problem:   S/P shoulder replacement, right Active Problems:   Closed fracture of right proximal humerus   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 female who was admitted 09/14/2021 with a diagnosis of closed fracture of right proximal humerusand went to the operating room on 09/14/2021 and underwent the above named procedures.    She was given perioperative antibiotics:  Anti-infectives (From admission, onward)    Start     Dose/Rate Route Frequency Ordered Stop   09/14/21 2130  ceFAZolin (ANCEF) IVPB 2g/100 mL premix       Note to Pharmacy: Patient tolerated the Ancef well in the OR   2 g 200 mL/hr over 30 Minutes Intravenous Every 6 hours 09/14/21 2038 09/15/21 0951   09/14/21 1245  ceFAZolin (ANCEF) IVPB 2g/100 mL premix        2 g 200 mL/hr over 30 Minutes Intravenous On call to O.R. 09/14/21 1242 09/14/21 1557       She was early ambulation.  POD#1 she worked with OT. POD#2 she worked with OT and her daughter was present for second session. Her pain was well controlled.   She benefited maximally from the hospital stay and there were no complications.    Recent vital signs:  Vitals:   09/15/21 2058 09/16/21 0645  BP: 127/62 107/77  Pulse: 77 77  Resp: 18 18  Temp: 98 F (36.7 C) 98.2 F (36.8 C)  SpO2: 97% 95%    Recent laboratory studies:  Lab Results  Component Value Date   HGB 9.2 (L) 09/15/2021   HGB 11.3 (L) 09/14/2021   HGB 11.9 (L) 07/17/2021   Lab Results  Component Value Date   WBC 5.3 09/14/2021   PLT 448 (H) 09/14/2021   Lab Results  Component Value Date   INR 0.88 06/06/2011   Lab Results  Component Value Date   NA 138 09/15/2021   K 4.1 09/15/2021   CL 105 09/15/2021   CO2 25 09/15/2021   BUN 11 09/15/2021   CREATININE 0.62 09/15/2021   GLUCOSE 113 (H) 09/15/2021     Allergies as of 09/16/2021       Reactions   Amoxicillin Nausea And Vomiting   Has patient had a PCN reaction causing immediate rash, facial/tongue/throat swelling, SOB or lightheadedness  with hypotension: #  #  #  YES  #  #  #  Has patient had a PCN reaction causing severe rash involving mucus membranes or skin necrosis: No Has patient had a PCN reaction that required hospitalization No Has patient had a PCN reaction occurring within the last 10 years: #  #  #  YES  #  #  #  If all of the above answers are "NO", then may proceed with Cephalosporin use.   Augmentin [amoxicillin-pot Clavulanate] Nausea And Vomiting   Demerol [meperidine Hcl] Nausea And Vomiting   Sulfa Antibiotics Nausea And Vomiting        Medication List     TAKE these medications    acetaminophen 500 MG tablet Commonly known as: TYLENOL Take 1,000-1,500 mg by mouth every 6 (six) hours as needed for moderate pain or mild pain.   albuterol 108 (90 Base) MCG/ACT inhaler Commonly known as: VENTOLIN HFA Inhale 2 puffs into the lungs every 4 (four)  hours as needed for wheezing or shortness of breath. For shortness of breath   ALPRAZolam 0.25 MG tablet Commonly known as: XANAX Take 0.125 mg by mouth daily as needed for anxiety.   calcium carbonate 1250 (500 Ca) MG tablet Commonly known as: OS-CAL - dosed in mg of elemental calcium Take 1 tablet by mouth daily.   dextromethorphan-guaiFENesin 30-600 MG 12hr tablet Commonly known as: MUCINEX DM Take 1 tablet by mouth 2 (two) times daily.   diazepam 5 MG tablet Commonly known as: VALIUM Take 5 mg by mouth every 6 (six) hours as needed for muscle spasms.   famotidine 40 MG tablet Commonly known as: PEPCID Take 1 tablet (40 mg total) by mouth at bedtime.   FLUoxetine 20 MG tablet Commonly known as: PROZAC Take 40 mg by mouth daily.   fluticasone 50 MCG/ACT nasal spray Commonly known as: FLONASE Place 1 spray into both nostrils daily as needed for allergies or rhinitis. What changed: when to take this   HYDROcodone-acetaminophen 5-325 MG tablet Commonly known as: NORCO/VICODIN Take 1 tablet by mouth every 6 (six) hours as needed for moderate pain or severe pain. What changed: how much to take   ipratropium 0.03 % nasal spray Commonly known as: ATROVENT Place 2 sprays into both nostrils 2 (two) times daily.   levocetirizine 5 MG tablet Commonly known as: XYZAL Take 1 tablet (5 mg total) by mouth 2 (two) times daily as needed (Can take an extra dose during flare ups.).   levothyroxine 75 MCG tablet Commonly known as: SYNTHROID Take 75 mcg by mouth daily before breakfast.   montelukast 10 MG tablet Commonly known as: SINGULAIR Take 1 tablet (10 mg total) by mouth at bedtime.   Naproxen Sodium 220 MG Caps Take 220-440 mg by mouth daily as needed (pain/ fever).   Olopatadine HCl 0.6 % Soln Place 1 spray into the nose in the morning and at bedtime.   omeprazole 40 MG capsule Commonly known as: PRILOSEC Take 1 capsule (40 mg total) by mouth in the morning. TAKE 1  CAPSULE BY MOUTH EVERY MORNING   predniSONE 20 MG tablet Commonly known as: DELTASONE Take 2 tablets (40 mg total) by mouth daily with breakfast.   pseudoephedrine 30 MG tablet Commonly known as: SUDAFED Take 30 mg by mouth every 4 (four) hours as needed for congestion.   rosuvastatin 10 MG tablet Commonly known as: CRESTOR Take 10 mg by mouth every morning.   VITAMIN B-12 IJ Inject as directed  every 30 (thirty) days.   Vitamin D (Ergocalciferol) 1.25 MG (50000 UNIT) Caps capsule Commonly known as: DRISDOL Take 50,000 Units by mouth every 7 (seven) days. On Mondays          WEIGHT BEARING   NWB right upper extremity.     CONSTIPATION  Constipation is defined medically as fewer than three stools per week and severe constipation as less than one stool per week.  Even if you have a regular bowel pattern at home, your normal regimen is likely to be disrupted due to multiple reasons following surgery.  Combination of anesthesia, postoperative narcotics, change in appetite and fluid intake all can affect your bowels.   YOU MUST use at least one of the following options; they are listed in order of increasing strength to get the job done.  They are all available over the counter, and you may need to use some, POSSIBLY even all of these options:    Drink plenty of fluids (prune juice may be helpful) and high fiber foods Colace 100 mg by mouth twice a day  Senokot for constipation as directed and as needed Dulcolax (bisacodyl), take with full glass of water  Miralax (polyethylene glycol) once or twice a day as needed.  If you have tried all these things and are unable to have a bowel movement in the first 3-4 days after surgery call either your surgeon or your primary doctor.    If you experience loose stools or diarrhea, hold the medications until you stool forms back up.  If your symptoms do not get better within 1 week or if they get worse, check with your doctor.  If you  experience "the worst abdominal pain ever" or develop nausea or vomiting, please contact the office immediately for further recommendations for treatment.   ITCHING:  If you experience itching with your medications, try taking only a single pain pill, or even half a pain pill at a time.  You can also use Benadryl over the counter for itching or also to help with sleep.   MEDICATIONS:  See your medication summary on the "After Visit Summary" that nursing will review with you.  You may have some home medications which will be placed on hold until you complete the course of blood thinner medication.  It is important for you to complete the blood thinner medication as prescribed.  PRECAUTIONS:  If you experience chest pain or shortness of breath - call 911 immediately for transfer to the hospital emergency department.   If you develop a fever greater that 101 F, purulent drainage from wound, increased redness or drainage from wound, foul odor from the wound/dressing, or calf pain - CONTACT YOUR SURGEON.                                                   FOLLOW-UP APPOINTMENTS:  If you do not already have a post-op appointment, please call the office for an appointment to be seen by your surgeon.  Guidelines for how soon to be seen are listed in your "After Visit Summary", but are typically between 1-4 weeks after surgery.   MAKE SURE YOU:  Understand these instructions.  Get help right away if you are not doing well or get worse.    Thank you for letting us be a part of your medical care  team.  It is a privilege we respect greatly.  We hope these instructions will help you stay on track for a fast and full recovery!   Diagnostic Studies: DG Shoulder Right Port  Result Date: 09/14/2021 CLINICAL DATA:  Right shoulder arthroplasty EXAM: RIGHT SHOULDER - 1 VIEW COMPARISON:  08/30/2021 FINDINGS: Portable frontal view of the right shoulder was performed. Total right shoulder arthroplasty is identified  within the expected position. The residual fracture fragments from the previously seen comminuted right humeral fracture. Postsurgical changes are seen in the overlying soft tissues. IMPRESSION: 1. Right shoulder arthroplasty as above. No evidence of acute complication. Electronically Signed   By: Randa Ngo M.D.   On: 09/14/2021 18:29   CT SHOULDER RIGHT WO CONTRAST  Result Date: 08/31/2021 CLINICAL DATA:  [place]. Right shoulder fracture. EXAM: CT OF THE UPPER RIGHT EXTREMITY WITHOUT CONTRAST TECHNIQUE: Multidetector CT imaging of the upper right extremity was performed according to the standard protocol. RADIATION DOSE REDUCTION: This exam was performed according to the departmental dose-optimization program which includes automated exposure control, adjustment of the mA and/or kV according to patient size and/or use of iterative reconstruction technique. COMPARISON:  None Available. FINDINGS: There is a complex comminuted fracture of the right humeral head and neck. The transverse fracture through the humeral neck demonstrates approximately 15 mm of medial displacement. Longitudinal fracture through the greater tuberosity minimally displaced. There is also a minimally displaced fracture through the base of the lesser tuberosity. The glenoid is intact. No significant glenohumeral joint degenerative changes. Moderate AC joint degenerative changes with significant synovial calcifications suggesting chondrocalcinosis. Similar findings are also noted at the right [place] joint. Grossly by CT the rotator cuff tendons are intact. There is a moderate-sized glenohumeral joint effusion and some inflammation/edema and hemorrhage in and around the shoulder musculature most notably the deltoid muscle. Probable subcutaneous hematoma in the subcutaneous fat superficial to the lateral deltoid. The right ribs are intact and the right lung is clear. IMPRESSION: 1. Complex comminuted fracture of the right humeral head and neck as  described above. 2. Moderate-sized glenohumeral joint effusion and some inflammation/edema and hemorrhage in and around the shoulder musculature most notably the deltoid muscle. 3. Grossly by CT the rotator cuff tendons are intact. 4. 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 Electronically Signed   By: Marijo Sanes M.D.   On: 08/31/2021 14:56    Disposition: Discharge disposition: 01-Home or Self Care       Discharge Instructions     Call MD / Call 911   Complete by: As directed    If you experience chest pain or shortness of breath, CALL 911 and be transported to the hospital emergency room.  If you develope a fever above 101 F, pus (white drainage) or increased drainage or redness at the wound, or calf pain, call your surgeon's office.   Constipation Prevention   Complete by: As directed    Drink plenty of fluids.  Prune juice may be helpful.  You may use a stool softener, such as Colace (over the counter) 100 mg twice a day.  Use MiraLax (over the counter) for constipation as needed.   Diet - low sodium heart healthy   Complete by: As directed    Driving restrictions   Complete by: As directed    No driving.   Increase activity slowly as tolerated   Complete by: As directed    Non weightbearing right upper extremity. Wear sling at all  times.   Lifting restrictions   Complete by: As directed    No lifting.   Post-operative opioid taper instructions:   Complete by: As directed    POST-OPERATIVE OPIOID TAPER INSTRUCTIONS: It is important to wean off of your opioid medication as soon as possible. If you do not need pain medication after your surgery it is ok to stop day one. Opioids include: Codeine, Hydrocodone(Norco, Vicodin), Oxycodone(Percocet, oxycontin) and hydromorphone amongst others.  Long term and even short term use of opiods can cause: Increased pain response Dependence Constipation Depression Respiratory  depression And more.  Withdrawal symptoms can include Flu like symptoms Nausea, vomiting And more Techniques to manage these symptoms Hydrate well Eat regular healthy meals Stay active Use relaxation techniques(deep breathing, meditating, yoga) Do Not substitute Alcohol to help with tapering If you have been on opioids for less than two weeks and do not have pain than it is ok to stop all together.  Plan to wean off of opioids This plan should start within one week post op of your joint replacement. Maintain the same interval or time between taking each dose and first decrease the dose.  Cut the total daily intake of opioids by one tablet each day Next start to increase the time between doses. The last dose that should be eliminated is the evening dose.           Follow-up Information     Netta Cedars, MD. Call in 2 week(s).   Specialty: Orthopedic Surgery Why: call [phone] for appointment for follow up Contact information: [address] [place] [address] [phone]                  Signed: Charlott Rakes, PA-C 09/19/2021, 10:22 AM

## 2021-09-27 DIAGNOSIS — Z4789 Encounter for other orthopedic aftercare: Secondary | ICD-10-CM | POA: Diagnosis not present

## 2021-10-02 DIAGNOSIS — M25511 Pain in right shoulder: Secondary | ICD-10-CM | POA: Diagnosis not present

## 2021-10-02 DIAGNOSIS — M25611 Stiffness of right shoulder, not elsewhere classified: Secondary | ICD-10-CM | POA: Diagnosis not present

## 2021-10-02 DIAGNOSIS — R2689 Other abnormalities of gait and mobility: Secondary | ICD-10-CM | POA: Diagnosis not present

## 2021-10-08 DIAGNOSIS — D225 Melanocytic nevi of trunk: Secondary | ICD-10-CM | POA: Diagnosis not present

## 2021-10-08 DIAGNOSIS — L821 Other seborrheic keratosis: Secondary | ICD-10-CM | POA: Diagnosis not present

## 2021-10-09 DIAGNOSIS — M25611 Stiffness of right shoulder, not elsewhere classified: Secondary | ICD-10-CM | POA: Diagnosis not present

## 2021-10-09 DIAGNOSIS — R2689 Other abnormalities of gait and mobility: Secondary | ICD-10-CM | POA: Diagnosis not present

## 2021-10-09 DIAGNOSIS — M25511 Pain in right shoulder: Secondary | ICD-10-CM | POA: Diagnosis not present

## 2021-10-16 DIAGNOSIS — M25611 Stiffness of right shoulder, not elsewhere classified: Secondary | ICD-10-CM | POA: Diagnosis not present

## 2021-10-16 DIAGNOSIS — M25511 Pain in right shoulder: Secondary | ICD-10-CM | POA: Diagnosis not present

## 2021-10-16 DIAGNOSIS — R2689 Other abnormalities of gait and mobility: Secondary | ICD-10-CM | POA: Diagnosis not present

## 2021-10-17 DIAGNOSIS — J310 Chronic rhinitis: Secondary | ICD-10-CM | POA: Diagnosis not present

## 2021-10-17 DIAGNOSIS — J324 Chronic pansinusitis: Secondary | ICD-10-CM | POA: Diagnosis not present

## 2021-10-17 DIAGNOSIS — J343 Hypertrophy of nasal turbinates: Secondary | ICD-10-CM | POA: Diagnosis not present

## 2021-10-19 DIAGNOSIS — M79645 Pain in left finger(s): Secondary | ICD-10-CM | POA: Diagnosis not present

## 2021-10-19 DIAGNOSIS — M79642 Pain in left hand: Secondary | ICD-10-CM | POA: Diagnosis not present

## 2021-10-23 DIAGNOSIS — M25511 Pain in right shoulder: Secondary | ICD-10-CM | POA: Diagnosis not present

## 2021-10-23 DIAGNOSIS — M25611 Stiffness of right shoulder, not elsewhere classified: Secondary | ICD-10-CM | POA: Diagnosis not present

## 2021-10-23 DIAGNOSIS — R2689 Other abnormalities of gait and mobility: Secondary | ICD-10-CM | POA: Diagnosis not present

## 2021-10-25 DIAGNOSIS — Z4789 Encounter for other orthopedic aftercare: Secondary | ICD-10-CM | POA: Diagnosis not present

## 2021-10-25 DIAGNOSIS — R2689 Other abnormalities of gait and mobility: Secondary | ICD-10-CM | POA: Diagnosis not present

## 2021-10-25 DIAGNOSIS — M25611 Stiffness of right shoulder, not elsewhere classified: Secondary | ICD-10-CM | POA: Diagnosis not present

## 2021-10-25 DIAGNOSIS — M25511 Pain in right shoulder: Secondary | ICD-10-CM | POA: Diagnosis not present

## 2021-10-29 DIAGNOSIS — R2689 Other abnormalities of gait and mobility: Secondary | ICD-10-CM | POA: Diagnosis not present

## 2021-10-29 DIAGNOSIS — M25611 Stiffness of right shoulder, not elsewhere classified: Secondary | ICD-10-CM | POA: Diagnosis not present

## 2021-10-29 DIAGNOSIS — M25511 Pain in right shoulder: Secondary | ICD-10-CM | POA: Diagnosis not present

## 2021-10-29 DIAGNOSIS — M79642 Pain in left hand: Secondary | ICD-10-CM | POA: Diagnosis not present

## 2021-10-31 ENCOUNTER — Encounter: Payer: Self-pay | Admitting: Pulmonary Disease

## 2021-10-31 ENCOUNTER — Ambulatory Visit (INDEPENDENT_AMBULATORY_CARE_PROVIDER_SITE_OTHER): Payer: Medicare Other | Admitting: Pulmonary Disease

## 2021-10-31 VITALS — BP 114/82 | HR 78 | Ht 59.0 in | Wt 102.2 lb

## 2021-10-31 DIAGNOSIS — J449 Chronic obstructive pulmonary disease, unspecified: Secondary | ICD-10-CM | POA: Diagnosis not present

## 2021-10-31 DIAGNOSIS — Z23 Encounter for immunization: Secondary | ICD-10-CM | POA: Diagnosis not present

## 2021-10-31 DIAGNOSIS — J4489 Other specified chronic obstructive pulmonary disease: Secondary | ICD-10-CM

## 2021-10-31 NOTE — Progress Notes (Signed)
Subjective:   PATIENT ID: Pamela Larson [name]: female DOB: 04/19/1945, MRN: [id number]   HPI  Chief Complaint  Patient presents with   Follow-up    No updates or complainants     Reason for Visit: Follow-up   Ms. Pamela Larson is a 76 year old female with COPD with emphysema, allergic rhinitis, GERD, hypothyroidism, arthritis, OA, RA and hx chronic fatigue syndrome/lyme disease s/p treatment who presents for follow-up.  Initial consult She was recently seen in April for acute visit for nasal congestion and cough that was treated by her ENT, Dr. Benjamine Mola with cefdinir and prednisone. Since then her cough and congestions has resolved. She states when she doesn't receive early treatment of sinus infections it will lead to respiratory symptoms but fortunately she has not had any significant respiratory symptoms since 2019. She is currently on Anoro. Denies shortness of breath, cough or wheezing.  Reviewed history per patient.  In 1991 she had a rash on her back. Had series of symptoms including fever and joint pain for many years. Was diagnosed with RA - seronegative. Also had chronic interstitial cystitis. Diagnosed with Lyme disease and after treatment doxycycline x 1 month with resolved RA and chronic interstitial cystitis. In 2015 had a significant episode of pneumonia and has had respiratory illnesses starting then. In 2019 she reports her worst year of lung infections occurring in April May June. After July no further infection after seeing Dr. [place] Bells and starting inhalers 2020 no lung infection 2021 August lung infection. Seen by Dr. Benjamine Mola for sinus 2022 no lung infection  10/31/21 Since our last visit she was treated for laryngitis with prednisone. She is compliant on Anoro. Denies shortness of breath, cough or wheezing. She does not chronic shortness of breath that is slightly worse compared to four years ago. In June she reports rib fractures related to mechanical fall over a  rug and then had another fall and in July broke her right shoulder which was repaired in August. She is slowly working back up to regular activity and currently in therapy for her shoulder. She is trying to walk up stairs and trying to be more active.  Social History: Former smoker. 40 pack years. Quit >30 years ago.  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    Osteoporosis    Pneumonia    hx of    Rheumatoid arthritis ([place])      Family History  Problem Relation Age of Onset   Breast cancer Mother    Colon cancer Mother        colorectal   Pancreatic cancer Mother    Rectal cancer Mother    Cancer Maternal Grandmother        abdominal   Lung cancer Other  maternal great aunt   Esophageal cancer Neg Hx    Stomach cancer Neg Hx      Social History   Occupational History   Occupation: retired Futures trader  Tobacco Use   Smoking status: Former    Packs/day: 2.00    Years: 20.00    Total pack years: 40.00    Types: Cigarettes    Quit date: 02/11/1990    Years since quitting: 31.7   Smokeless tobacco: Never  Vaping Use   Vaping Use: Never used  Substance and Sexual Activity   Alcohol use: Yes    Alcohol/week: 4.0 standard drinks of alcohol    Types: 4 Standard drinks or equivalent per week   Drug use: No   Sexual activity: Never    Partners: Male    Birth control/protection: Post-menopausal    Allergies  Allergen  Reactions   Amoxicillin Nausea And Vomiting     Has patient had a PCN reaction causing immediate rash, facial/tongue/throat swelling, SOB or lightheadedness with hypotension: #  #  #  YES  #  #  #  Has patient had a PCN reaction causing severe rash involving mucus membranes or skin necrosis: No Has patient had a PCN reaction that required hospitalization No Has patient had a PCN reaction occurring within the last 10 years: #  #  #  YES  #  #  #  If all of the above answers are "NO", then may proceed with Cephalosporin use.   Augmentin [Amoxicillin-Pot Clavulanate] Nausea And Vomiting   Demerol [Meperidine Hcl] Nausea And Vomiting   Sulfa Antibiotics Nausea And Vomiting     Outpatient Medications Prior to Visit  Medication Sig Dispense Refill   acetaminophen (TYLENOL) 500 MG tablet Take 1,000-1,500 mg by mouth every 6 (six) hours as needed for moderate pain or mild pain.     albuterol (VENTOLIN HFA) 108 (90 Base) MCG/ACT inhaler Inhale 2 puffs into the lungs every 4 (four) hours as needed for wheezing or shortness of breath. For shortness of breath 18 g 1   ALPRAZolam (XANAX) 0.25 MG tablet Take 0.125 mg by mouth daily as needed for anxiety.     ANORO ELLIPTA 62.5-25 MCG/ACT AEPB INHALE 1 PUFF INTO THE LUNGS DAILY 60 each 3   calcium carbonate (OS-CAL - DOSED IN MG OF ELEMENTAL CALCIUM) 1250 MG tablet Take 1 tablet by mouth daily.     Cyanocobalamin (VITAMIN B-12 IJ) Inject as directed every 30 (thirty) days.     famotidine (PEPCID) 40 MG tablet Take 1 tablet (40 mg total) by mouth at bedtime. 30 tablet 5   FLUoxetine (PROZAC) 20 MG tablet Take 40 mg by mouth daily.     HYDROcodone-acetaminophen (NORCO/VICODIN) 5-325 MG tablet Take 1 tablet by mouth every 6 (six) hours as needed for moderate pain or severe pain. 30 tablet 0   ipratropium (ATROVENT) 0.03 % nasal spray Place 2 sprays into both nostrils 2 (two) times daily. 30 mL 5   levocetirizine (XYZAL) 5 MG tablet Take 1 tablet (5 mg total)  by mouth 2 (two) times daily as needed (Can take an extra dose during flare ups.). 60 tablet 5   levothyroxine (SYNTHROID) 75 MCG tablet Take 75 mcg by mouth daily before breakfast.     montelukast (SINGULAIR) 10 MG tablet Take 1 tablet (10 mg total) by mouth at bedtime. 30 tablet 5   Naproxen Sodium 220 MG CAPS Take 220-440 mg by mouth daily as needed (pain/ fever).  Olopatadine HCl 0.6 % SOLN Place 1 spray into the nose in the morning and at bedtime. 30.5 g 5   omeprazole (PRILOSEC) 40 MG capsule Take 1 capsule (40 mg total) by mouth in the morning. TAKE 1 CAPSULE BY MOUTH EVERY MORNING 30 capsule 5   predniSONE (DELTASONE) 20 MG tablet Take 2 tablets (40 mg total) by mouth daily with breakfast. 10 tablet 0   pseudoephedrine (SUDAFED) 30 MG tablet Take 30 mg by mouth every 4 (four) hours as needed for congestion.     rosuvastatin (CRESTOR) 10 MG tablet Take 10 mg by mouth every morning.     Vitamin D, Ergocalciferol, (DRISDOL) 50000 UNITS CAPS Take 50,000 Units by mouth every 7 (seven) days. On Mondays     dextromethorphan-guaiFENesin (MUCINEX DM) 30-600 MG 12hr tablet Take 1 tablet by mouth 2 (two) times daily. (Patient not taking: Reported on 10/31/2021)     diazepam (VALIUM) 5 MG tablet Take 5 mg by mouth every 6 (six) hours as needed for muscle spasms. (Patient not taking: Reported on 10/31/2021)     fluticasone (FLONASE) 50 MCG/ACT nasal spray Place 1 spray into both nostrils daily as needed for allergies or rhinitis. (Patient not taking: Reported on 10/31/2021) 16 g 5   No facility-administered medications prior to visit.    Review of Systems  Constitutional:  Negative for chills, diaphoresis, fever, malaise/fatigue and weight loss.  HENT:  Negative for congestion.   Respiratory:  Positive for shortness of breath. Negative for cough, hemoptysis, sputum production and wheezing.   Cardiovascular:  Negative for chest pain, palpitations and leg swelling.     Objective:   Vitals:    10/31/21 1406  BP: 114/82  Pulse: 78  SpO2: 98%  Weight: 102 lb 3.2 oz (46.4 kg)  Height: '4\' 11"'$  (1.499 m)   SpO2: 98 % O2 Device: None (Room air)  Physical Exam: General: Well-appearing, no acute distress HENT: [place], AT Eyes: EOMI, no scleral icterus Respiratory: Clear to auscultation bilaterally.  No crackles, wheezing or rales Cardiovascular: RRR, -M/R/G, no JVD Extremities:-Edema,-tenderness Neuro: AAO x4, CNII-XII grossly intact Psych: Normal mood, normal affect  Data Reviewed:  Imaging: CT Chest 08/27/17 - Mild paraseptal emphysema. CXR 06/28/21 - No acute infiltrate, effusion or edema  PFT: 10/16/17 FVC 2.55 (101%) FEV1 2.10 (110%) Ratio 80  TLC 99% DLCO 84% Interpretation: Normal PFTs  Spirometry 12/26/20 FVC 1.94 (85%) FEV1 (87%) Ratio 79%  Labs: CBC    Component Value Date/Time   WBC 5.3 09/14/2021 1243   RBC 3.30 (L) 09/14/2021 1243   HGB 9.2 (L) 09/15/2021 0414   HGB 14.8 10/07/2017 1442   HCT 28.4 (L) 09/15/2021 0414   HCT 36.2 05/11/2019 1445   PLT 448 (H) 09/14/2021 1243   PLT 326 10/07/2017 1442   MCV 108.5 (H) 09/14/2021 1243   MCV 102 (H) 10/07/2017 1442   MCH 34.2 (H) 09/14/2021 1243   MCHC 31.6 09/14/2021 1243   RDW 12.7 09/14/2021 1243   RDW 13.5 10/07/2017 1442   LYMPHSABS 1.0 07/17/2021 1815   LYMPHSABS 1.1 10/07/2017 1442   MONOABS 0.6 07/17/2021 1815   EOSABS 0.1 07/17/2021 1815   EOSABS 0.0 10/07/2017 1442   BASOSABS 0.1 07/17/2021 1815   BASOSABS 0.0 10/07/2017 1442        Assessment & Plan:   Discussion: 76 year old female with COPD with emphysema, allergic rhinitis, GERD, hypothyroidism, arthritis, OA, RA and hx chronic fatigue syndrome/lyme disease s/p treatment who presents for follow-up. Symptoms are well-controlled.  Feels well-controlled from ENT and Allergy standpoint.  COPD with paraseptal emphysema --CONTINUE Anoro ONE puff ONCE a day --CONTINUE Albuterol AS NEEDED for shortness of breath or wheezing --Discussed  being up-to-date vaccinations including influenza, COVID, RSV and pneumococcal --PFTs due 2024  Chronic sinus congestion Reflux --CONTINUE singulair 10 mg daily --CONTINUE omeprazole 40 mg daily   Health Maintenance Immunization History  Administered Date(s) Administered   Influenza Inj Mdck Quad Pf 12/26/2015   Influenza Split 05/26/2008, 06/05/2009, 11/16/2009, 12/07/2010, 11/20/2011, 12/25/2012, 01/01/2014   Influenza, High Dose Seasonal PF 01/01/2014, 11/17/2014, 12/07/2016, 11/19/2017, 12/05/2018   Influenza-Unspecified 12/07/2016, 11/16/2018   PFIZER(Purple Top)SARS-COV-2 Vaccination 03/19/2019, 04/13/2019, 10/12/2019   Pneumococcal Conjugate-13 07/07/2013   Pneumococcal Polysaccharide-23 05/26/2008, 06/05/2009, 06/29/2012, 10/22/2017   Td 05/26/2008, 06/05/2009   Tdap 05/19/2007, 11/01/2016   Zoster Recombinat (Shingrix) 12/26/2016, 06/12/2017   Zoster, Live 05/26/2008, 06/05/2009, 06/08/2010, 11/17/2014, 12/26/2016   CT Lung Screen - not qualified. Quit >15 years ago  No orders of the defined types were placed in this encounter. No orders of the defined types were placed in this encounter.   Return in about 4 months (around 03/02/2022).  I have spent a total time of 25-minutes on the day of the appointment including chart review, data review, collecting history, coordinating care and discussing medical diagnosis and plan with the patient/family. Past medical history, allergies, medications were reviewed. Pertinent imaging, labs and tests included in this note have been reviewed and interpreted independently by me.  Martin, MD [facility] 10/31/2021 2:31 PM  Office Number [phone]

## 2021-10-31 NOTE — Patient Instructions (Addendum)
COPD with paraseptal emphysema --CONTINUE Anoro ONE puff ONCE a day --CONTINUE Albuterol AS NEEDED for shortness of breath or wheezing --Discussed being up-to-date vaccinations including influenza, COVID, RSV and pneumococcal  Follow-up with me in 4 months

## 2021-11-02 DIAGNOSIS — M25611 Stiffness of right shoulder, not elsewhere classified: Secondary | ICD-10-CM | POA: Diagnosis not present

## 2021-11-02 DIAGNOSIS — M25511 Pain in right shoulder: Secondary | ICD-10-CM | POA: Diagnosis not present

## 2021-11-02 DIAGNOSIS — R2689 Other abnormalities of gait and mobility: Secondary | ICD-10-CM | POA: Diagnosis not present

## 2021-11-06 DIAGNOSIS — M25611 Stiffness of right shoulder, not elsewhere classified: Secondary | ICD-10-CM | POA: Diagnosis not present

## 2021-11-06 DIAGNOSIS — R2689 Other abnormalities of gait and mobility: Secondary | ICD-10-CM | POA: Diagnosis not present

## 2021-11-06 DIAGNOSIS — M79645 Pain in left finger(s): Secondary | ICD-10-CM | POA: Diagnosis not present

## 2021-11-06 DIAGNOSIS — Z23 Encounter for immunization: Secondary | ICD-10-CM | POA: Diagnosis not present

## 2021-11-06 DIAGNOSIS — M25511 Pain in right shoulder: Secondary | ICD-10-CM | POA: Diagnosis not present

## 2021-11-06 DIAGNOSIS — S22040A Wedge compression fracture of fourth thoracic vertebra, initial encounter for closed fracture: Secondary | ICD-10-CM | POA: Diagnosis not present

## 2021-11-08 DIAGNOSIS — M25611 Stiffness of right shoulder, not elsewhere classified: Secondary | ICD-10-CM | POA: Diagnosis not present

## 2021-11-08 DIAGNOSIS — M25511 Pain in right shoulder: Secondary | ICD-10-CM | POA: Diagnosis not present

## 2021-11-08 DIAGNOSIS — R2689 Other abnormalities of gait and mobility: Secondary | ICD-10-CM | POA: Diagnosis not present

## 2021-11-12 DIAGNOSIS — R2689 Other abnormalities of gait and mobility: Secondary | ICD-10-CM | POA: Diagnosis not present

## 2021-11-12 DIAGNOSIS — M25611 Stiffness of right shoulder, not elsewhere classified: Secondary | ICD-10-CM | POA: Diagnosis not present

## 2021-11-12 DIAGNOSIS — M25511 Pain in right shoulder: Secondary | ICD-10-CM | POA: Diagnosis not present

## 2021-11-14 ENCOUNTER — Other Ambulatory Visit: Payer: Self-pay | Admitting: Allergy and Immunology

## 2021-11-15 DIAGNOSIS — R2689 Other abnormalities of gait and mobility: Secondary | ICD-10-CM | POA: Diagnosis not present

## 2021-11-15 DIAGNOSIS — M25511 Pain in right shoulder: Secondary | ICD-10-CM | POA: Diagnosis not present

## 2021-11-15 DIAGNOSIS — M25611 Stiffness of right shoulder, not elsewhere classified: Secondary | ICD-10-CM | POA: Diagnosis not present

## 2021-11-19 DIAGNOSIS — M25511 Pain in right shoulder: Secondary | ICD-10-CM | POA: Diagnosis not present

## 2021-11-19 DIAGNOSIS — M25611 Stiffness of right shoulder, not elsewhere classified: Secondary | ICD-10-CM | POA: Diagnosis not present

## 2021-11-19 DIAGNOSIS — R2689 Other abnormalities of gait and mobility: Secondary | ICD-10-CM | POA: Diagnosis not present

## 2021-11-22 DIAGNOSIS — Z4789 Encounter for other orthopedic aftercare: Secondary | ICD-10-CM | POA: Diagnosis not present

## 2021-11-27 DIAGNOSIS — M25611 Stiffness of right shoulder, not elsewhere classified: Secondary | ICD-10-CM | POA: Diagnosis not present

## 2021-11-27 DIAGNOSIS — M25511 Pain in right shoulder: Secondary | ICD-10-CM | POA: Diagnosis not present

## 2021-11-27 DIAGNOSIS — R2689 Other abnormalities of gait and mobility: Secondary | ICD-10-CM | POA: Diagnosis not present

## 2021-11-29 DIAGNOSIS — M25511 Pain in right shoulder: Secondary | ICD-10-CM | POA: Diagnosis not present

## 2021-11-29 DIAGNOSIS — R2689 Other abnormalities of gait and mobility: Secondary | ICD-10-CM | POA: Diagnosis not present

## 2021-11-29 DIAGNOSIS — M25611 Stiffness of right shoulder, not elsewhere classified: Secondary | ICD-10-CM | POA: Diagnosis not present

## 2021-12-03 DIAGNOSIS — M25511 Pain in right shoulder: Secondary | ICD-10-CM | POA: Diagnosis not present

## 2021-12-03 DIAGNOSIS — R2689 Other abnormalities of gait and mobility: Secondary | ICD-10-CM | POA: Diagnosis not present

## 2021-12-03 DIAGNOSIS — M25611 Stiffness of right shoulder, not elsewhere classified: Secondary | ICD-10-CM | POA: Diagnosis not present

## 2021-12-05 DIAGNOSIS — M25511 Pain in right shoulder: Secondary | ICD-10-CM | POA: Diagnosis not present

## 2021-12-18 ENCOUNTER — Ambulatory Visit: Payer: Medicare Other | Admitting: Allergy and Immunology

## 2021-12-19 DIAGNOSIS — Z96611 Presence of right artificial shoulder joint: Secondary | ICD-10-CM | POA: Diagnosis not present

## 2021-12-19 DIAGNOSIS — M25511 Pain in right shoulder: Secondary | ICD-10-CM | POA: Diagnosis not present

## 2021-12-20 DIAGNOSIS — M6281 Muscle weakness (generalized): Secondary | ICD-10-CM | POA: Diagnosis not present

## 2021-12-20 DIAGNOSIS — M25611 Stiffness of right shoulder, not elsewhere classified: Secondary | ICD-10-CM | POA: Diagnosis not present

## 2021-12-20 DIAGNOSIS — Z96611 Presence of right artificial shoulder joint: Secondary | ICD-10-CM | POA: Diagnosis not present

## 2021-12-25 DIAGNOSIS — M25611 Stiffness of right shoulder, not elsewhere classified: Secondary | ICD-10-CM | POA: Diagnosis not present

## 2021-12-25 DIAGNOSIS — D510 Vitamin B12 deficiency anemia due to intrinsic factor deficiency: Secondary | ICD-10-CM | POA: Diagnosis not present

## 2021-12-25 DIAGNOSIS — M6281 Muscle weakness (generalized): Secondary | ICD-10-CM | POA: Diagnosis not present

## 2021-12-25 DIAGNOSIS — Z96611 Presence of right artificial shoulder joint: Secondary | ICD-10-CM | POA: Diagnosis not present

## 2022-01-01 DIAGNOSIS — Z96611 Presence of right artificial shoulder joint: Secondary | ICD-10-CM | POA: Diagnosis not present

## 2022-01-01 DIAGNOSIS — M25611 Stiffness of right shoulder, not elsewhere classified: Secondary | ICD-10-CM | POA: Diagnosis not present

## 2022-01-01 DIAGNOSIS — M6281 Muscle weakness (generalized): Secondary | ICD-10-CM | POA: Diagnosis not present

## 2022-01-02 DIAGNOSIS — M6281 Muscle weakness (generalized): Secondary | ICD-10-CM | POA: Diagnosis not present

## 2022-01-02 DIAGNOSIS — M25611 Stiffness of right shoulder, not elsewhere classified: Secondary | ICD-10-CM | POA: Diagnosis not present

## 2022-01-02 DIAGNOSIS — Z96611 Presence of right artificial shoulder joint: Secondary | ICD-10-CM | POA: Diagnosis not present

## 2022-01-03 ENCOUNTER — Other Ambulatory Visit: Payer: Self-pay

## 2022-01-03 ENCOUNTER — Encounter (HOSPITAL_BASED_OUTPATIENT_CLINIC_OR_DEPARTMENT_OTHER): Payer: Self-pay

## 2022-01-03 ENCOUNTER — Emergency Department (HOSPITAL_BASED_OUTPATIENT_CLINIC_OR_DEPARTMENT_OTHER)
Admission: EM | Admit: 2022-01-03 | Discharge: 2022-01-03 | Disposition: A | Discharge status: Home / Self Care | Payer: Medicare Other | Attending: Emergency Medicine | Admitting: Emergency Medicine

## 2022-01-03 ENCOUNTER — Emergency Department (HOSPITAL_BASED_OUTPATIENT_CLINIC_OR_DEPARTMENT_OTHER): Payer: Medicare Other

## 2022-01-03 DIAGNOSIS — W19XXXA Unspecified fall, initial encounter: Secondary | ICD-10-CM

## 2022-01-03 DIAGNOSIS — E039 Hypothyroidism, unspecified: Secondary | ICD-10-CM | POA: Insufficient documentation

## 2022-01-03 DIAGNOSIS — S0181XA Laceration without foreign body of other part of head, initial encounter: Secondary | ICD-10-CM | POA: Insufficient documentation

## 2022-01-03 DIAGNOSIS — S01112A Laceration without foreign body of left eyelid and periocular area, initial encounter: Secondary | ICD-10-CM | POA: Diagnosis not present

## 2022-01-03 DIAGNOSIS — S0993XA Unspecified injury of face, initial encounter: Secondary | ICD-10-CM | POA: Diagnosis present

## 2022-01-03 DIAGNOSIS — W010XXA Fall on same level from slipping, tripping and stumbling without subsequent striking against object, initial encounter: Secondary | ICD-10-CM | POA: Insufficient documentation

## 2022-01-03 DIAGNOSIS — Z23 Encounter for immunization: Secondary | ICD-10-CM | POA: Diagnosis not present

## 2022-01-03 DIAGNOSIS — J449 Chronic obstructive pulmonary disease, unspecified: Secondary | ICD-10-CM | POA: Insufficient documentation

## 2022-01-03 DIAGNOSIS — N189 Chronic kidney disease, unspecified: Secondary | ICD-10-CM | POA: Insufficient documentation

## 2022-01-03 DIAGNOSIS — S098XXA Other specified injuries of head, initial encounter: Secondary | ICD-10-CM | POA: Diagnosis not present

## 2022-01-03 DIAGNOSIS — S0990XA Unspecified injury of head, initial encounter: Secondary | ICD-10-CM | POA: Insufficient documentation

## 2022-01-03 DIAGNOSIS — S199XXA Unspecified injury of neck, initial encounter: Secondary | ICD-10-CM | POA: Diagnosis not present

## 2022-01-03 MED ORDER — TETANUS-DIPHTH-ACELL PERTUSSIS 5-2.5-18.5 LF-MCG/0.5 IM SUSY
0.5000 mL | PREFILLED_SYRINGE | Freq: Once | INTRAMUSCULAR | Status: AC
  Administered 2022-01-03: 0.5 mL via INTRAMUSCULAR
  Filled 2022-01-03: qty 0.5

## 2022-01-03 MED ORDER — LIDOCAINE-EPINEPHRINE (PF) 2 %-1:200000 IJ SOLN
10.0000 mL | Freq: Once | INTRAMUSCULAR | Status: AC
  Administered 2022-01-03: 10 mL via INTRADERMAL
  Filled 2022-01-03: qty 20

## 2022-01-03 NOTE — Discharge Instructions (Addendum)
Thank you for coming to [facility] Emergency Department. You were seen for fall with head trauma. We did an exam, labs, and imaging, and these showed a laceration of your face. This was repaired with sutures. Please have these sutures removed in 5-7 days by a medical provider. You can take tylenol for pain and ice the area to help decrease swelling.  Please follow up with your primary care provider within 1 week.   Your blood pressure was slightly high while you were in the ED. You may need adjustments to your blood pressure medication. Please follow with your primary care doctor about this.  Do not hesitate to return to the ED or call 911 if you experience: -Worsening symptoms -Signs of infection such as increased redness, pain, pus drainage from the wound -Lightheadedness, passing out -Fevers/chills -Anything else that concerns you

## 2022-01-03 NOTE — ED Provider Notes (Signed)
[facility] EMERGENCY DEPT Provider Note   CSN: [id number] Arrival date & time: 01/03/22  2024     History  Chief Complaint  Patient presents with   Pamela Larson is a 76 y.o. female with COPD, fibronodular, osteoporosis, CKD, hypothyroidism, GERD, history of Lyme disease, HLD, atherosclerotic heart disease, monoclonal gammopathy, pernicious anemia, cataracts, chronic interstitial cystitis, chronic maxillary sinusitis, who presents with fall.  Patient had a mechanical fall tripped over her dishwasher that was open today and fell and struck her left forehead on the ground.  Denies loss of consciousness.  Endorses pain over the left eyebrow at the location of the laceration.  Does not think she has any neck pain, and denies pain anywhere else.  Otherwise has felt in her normal state of health. Patient does not take a blood thinner.    Fall       Home Medications Prior to Admission medications   Medication Sig Start Date End Date Taking? Authorizing Provider  acetaminophen (TYLENOL) 500 MG tablet Take 1,000-1,500 mg by mouth every 6 (six) hours as needed for moderate pain or mild pain.    [provider]  albuterol (VENTOLIN HFA) 108 (90 Base) MCG/ACT inhaler Inhale 2 puffs into the lungs every 4 (four) hours as needed for wheezing or shortness of breath. For shortness of breath 12/26/20   Kozlow, Donnamarie Poag, MD  ALPRAZolam Duanne Moron) 0.25 MG tablet Take 0.125 mg by mouth daily as needed for anxiety.    [provider]  Celedonio Miyamoto 62.5-25 MCG/ACT AEPB INHALE 1 PUFF INTO THE LUNGS DAILY 09/17/21   Kozlow, Donnamarie Poag, MD  calcium carbonate (OS-CAL - DOSED IN MG OF ELEMENTAL CALCIUM) 1250 MG tablet Take 1 tablet by mouth daily.    [provider]  Cyanocobalamin (VITAMIN B-12 IJ) Inject as directed every 30 (thirty) days.    [provider]  dextromethorphan-guaiFENesin (MUCINEX DM) 30-600 MG 12hr tablet Take 1 tablet by mouth 2 (two)  times daily. Patient not taking: Reported on 10/31/2021    [provider]  diazepam (VALIUM) 5 MG tablet Take 5 mg by mouth every 6 (six) hours as needed for muscle spasms. Patient not taking: Reported on 10/31/2021    [provider]  famotidine (PEPCID) 40 MG tablet TAKE 1 TABLET(40 MG) BY MOUTH AT BEDTIME 11/14/21   Kozlow, Donnamarie Poag, MD  famotidine (PEPCID) 40 MG tablet TAKE 1 TABLET(40 MG) BY MOUTH AT BEDTIME 11/14/21   Kozlow, Donnamarie Poag, MD  FLUoxetine (PROZAC) 20 MG tablet Take 40 mg by mouth daily.    [provider]  fluticasone (FLONASE) 50 MCG/ACT nasal spray Place 1 spray into both nostrils daily as needed for allergies or rhinitis. Patient not taking: Reported on 10/31/2021 06/26/21   Jiles Prows, MD  HYDROcodone-acetaminophen (NORCO/VICODIN) 5-325 MG tablet Take 1 tablet by mouth every 6 (six) hours as needed for moderate pain or severe pain. 09/14/21   Netta Cedars, MD  ipratropium (ATROVENT) 0.03 % nasal spray Place 2 sprays into both nostrils 2 (two) times daily. 06/26/21   Kozlow, Donnamarie Poag, MD  levocetirizine (XYZAL) 5 MG tablet Take 1 tablet (5 mg total) by mouth 2 (two) times daily as needed (Can take an extra dose during flare ups.). 06/26/21   Kozlow, Donnamarie Poag, MD  levothyroxine (SYNTHROID) 75 MCG tablet Take 75 mcg by mouth daily before breakfast.    [provider]  montelukast (SINGULAIR) 10 MG tablet Take 1 tablet (10 mg  total) by mouth at bedtime. 06/26/21   Kozlow, Donnamarie Poag, MD  Naproxen Sodium 220 MG CAPS Take 220-440 mg by mouth daily as needed (pain/ fever).    [provider]  Olopatadine HCl 0.6 % SOLN Place 1 spray into the nose in the morning and at bedtime. 06/26/21   Kozlow, Donnamarie Poag, MD  omeprazole (PRILOSEC) 40 MG capsule Take 1 capsule (40 mg total) by mouth in the morning. TAKE 1 CAPSULE BY MOUTH EVERY MORNING 06/26/21   Kozlow, Donnamarie Poag, MD  predniSONE (DELTASONE) 20 MG tablet Take 2 tablets (40 mg total) by mouth daily with breakfast.  07/13/21   Cobb, Karie Schwalbe, NP  pseudoephedrine (SUDAFED) 30 MG tablet Take 30 mg by mouth every 4 (four) hours as needed for congestion.    [provider]  rosuvastatin (CRESTOR) 10 MG tablet Take 10 mg by mouth every morning. 08/02/18   [provider]  Vitamin D, Ergocalciferol, (DRISDOL) 50000 UNITS CAPS Take 50,000 Units by mouth every 7 (seven) days. On Mondays    [provider]      Allergies    Amoxicillin, Augmentin [amoxicillin-pot clavulanate], Demerol [meperidine hcl], and Sulfa antibiotics    Review of Systems   Review of Systems Review of systems negative for LOC.  A 10 point review of systems was performed and is negative unless otherwise reported in HPI.  Physical Exam Updated Vital Signs BP (!) 175/79 (BP Location: Left Arm)   Pulse 78   Temp 98.2 F (36.8 C) (Oral)   Resp 14   Ht '4\' 11"'$  (1.499 m)   Wt 46.7 kg   SpO2 98%   BMI 20.80 kg/m  Physical Exam General: Normal appearing female, lying in bed.  HEENT: PERRLA, EOMI, no nystagmus, Sclera anicteric, MMM, trachea midline. 4 cm partially linear minimally stellate laceration to the left eyebrow, hemostatic, with minimal surrouding swelling and TTP. Forehead stable, midface stable, nasal bridge stable. No septal hematoma. No C-spine TTP or stepoffs.  Cardiology: RRR, no murmurs/rubs/gallops.  Resp: Normal respiratory rate and effort. CTAB, no wheezes, rhonchi, crackles.  Abd: Soft, non-tender, non-distended. No rebound tenderness or guarding.  GU: Deferred. MSK: No peripheral edema or signs of trauma. Extremities without deformity or TTP. No cyanosis or clubbing. Skin: warm, dry. No rashes or lesions. Neuro: A&Ox4, CNs II-XII grossly intact. MAEs. Sensation grossly intact.  Psych: Normal mood and affect.   ED Results / Procedures / Treatments    Radiology CT Head Wo Contrast  Result Date: 01/03/2022 CLINICAL DATA:  Head trauma, minor (Age >= 65y); Neck trauma (Age >= 65y) EXAM: CT  HEAD WITHOUT CONTRAST CT CERVICAL SPINE WITHOUT CONTRAST TECHNIQUE: Multidetector CT imaging of the head and cervical spine was performed following the standard protocol without intravenous contrast. Multiplanar CT image reconstructions of the cervical spine were also generated. RADIATION DOSE REDUCTION: This exam was performed according to the departmental dose-optimization program which includes automated exposure control, adjustment of the mA and/or kV according to patient size and/or use of iterative reconstruction technique. COMPARISON:  CT head and C-spine 11/02/2013, CT paranasal sinuses 07/03/2018, x-ray cervical spine 04/19/2016 FINDINGS: CT HEAD FINDINGS BRAIN: BRAIN Cerebral ventricle sizes are concordant with the degree of cerebral volume loss. Patchy and confluent areas of decreased attenuation are noted throughout the deep and periventricular white matter of the cerebral hemispheres bilaterally, compatible with chronic microvascular ischemic disease. No evidence of large-territorial acute infarction. No parenchymal hemorrhage. No mass lesion. No extra-axial collection. No mass effect or midline  shift. No hydrocephalus. Basilar cisterns are patent. Vascular: No hyperdense vessel. Skull: No acute fracture or focal lesion. Sinuses/Orbits: Paranasal sinuses and mastoid air cells are clear. Bilateral lens replacement. Otherwise the orbits are unremarkable. Other: None. CT CERVICAL SPINE FINDINGS Alignment: Grade 1 anterolisthesis of C3 on C4 and C4 on C5. Grade 1 anterolisthesis of C7 on T1. Findings likely due to degenerative changes. Skull base and vertebrae: Multilevel moderate facet arthropathy and mild osteophyte formation. Stable intervertebral disc space narrowing at the C5-C6 level. C6-C7 intervertebral disc space surgical hardware. No acute fracture. No aggressive appearing focal osseous lesion or focal pathologic process. Soft tissues and spinal canal: No prevertebral fluid or swelling. No visible  canal hematoma. Upper chest: Unremarkable. Other: None. IMPRESSION: 1. No acute intracranial abnormality. 2. No acute displaced fracture or traumatic listhesis of the cervical spine. Electronically Signed   By: Iven Finn M.D.   On: 01/03/2022 21:23   CT Cervical Spine Wo Contrast  Result Date: 01/03/2022 CLINICAL DATA:  Head trauma, minor (Age >= 65y); Neck trauma (Age >= 65y) EXAM: CT HEAD WITHOUT CONTRAST CT CERVICAL SPINE WITHOUT CONTRAST TECHNIQUE: Multidetector CT imaging of the head and cervical spine was performed following the standard protocol without intravenous contrast. Multiplanar CT image reconstructions of the cervical spine were also generated. RADIATION DOSE REDUCTION: This exam was performed according to the departmental dose-optimization program which includes automated exposure control, adjustment of the mA and/or kV according to patient size and/or use of iterative reconstruction technique. COMPARISON:  CT head and C-spine 11/02/2013, CT paranasal sinuses 07/03/2018, x-ray cervical spine 04/19/2016 FINDINGS: CT HEAD FINDINGS BRAIN: BRAIN Cerebral ventricle sizes are concordant with the degree of cerebral volume loss. Patchy and confluent areas of decreased attenuation are noted throughout the deep and periventricular white matter of the cerebral hemispheres bilaterally, compatible with chronic microvascular ischemic disease. No evidence of large-territorial acute infarction. No parenchymal hemorrhage. No mass lesion. No extra-axial collection. No mass effect or midline shift. No hydrocephalus. Basilar cisterns are patent. Vascular: No hyperdense vessel. Skull: No acute fracture or focal lesion. Sinuses/Orbits: Paranasal sinuses and mastoid air cells are clear. Bilateral lens replacement. Otherwise the orbits are unremarkable. Other: None. CT CERVICAL SPINE FINDINGS Alignment: Grade 1 anterolisthesis of C3 on C4 and C4 on C5. Grade 1 anterolisthesis of C7 on T1. Findings likely due to  degenerative changes. Skull base and vertebrae: Multilevel moderate facet arthropathy and mild osteophyte formation. Stable intervertebral disc space narrowing at the C5-C6 level. C6-C7 intervertebral disc space surgical hardware. No acute fracture. No aggressive appearing focal osseous lesion or focal pathologic process. Soft tissues and spinal canal: No prevertebral fluid or swelling. No visible canal hematoma. Upper chest: Unremarkable. Other: None. IMPRESSION: 1. No acute intracranial abnormality. 2. No acute displaced fracture or traumatic listhesis of the cervical spine. Electronically Signed   By: Iven Finn M.D.   On: 01/03/2022 21:23    Procedures .Marland KitchenLaceration Repair  Date/Time: 01/03/2022 10:10 PM  Performed by: Audley Hose, MD Authorized by: Audley Hose, MD   Consent:    Consent obtained:  Verbal   Consent given by:  Patient   Risks, benefits, and alternatives were discussed: yes     Risks discussed:  Infection, pain, tendon damage, vascular damage, poor wound healing, poor cosmetic result, need for additional repair and nerve damage   Alternatives discussed:  No treatment Universal protocol:    Test results available: yes     Imaging studies available: yes     Required  blood products, implants, devices, and special equipment available: yes     Site/side marked: yes     Immediately prior to procedure, a time out was called: yes     Patient identity confirmed:  Verbally with patient Anesthesia:    Anesthesia method:  Local infiltration   Local anesthetic:  Lidocaine 2% WITH epi Laceration details:    Location:  Face   Face location:  L eyebrow   Length (cm):  4   Depth (mm):  5 Pre-procedure details:    Preparation:  Patient was prepped and draped in usual sterile fashion and imaging obtained to evaluate for foreign bodies Exploration:    Hemostasis achieved with:  Direct pressure   Imaging obtained comment:  CT   Imaging outcome: foreign body not noted      Wound exploration: wound explored through full range of motion     Wound extent: fascia not violated, no foreign body, no signs of injury, no nerve damage, no tendon damage, no underlying fracture and no vascular damage     Contaminated: no   Treatment:    Area cleansed with:  Saline   Amount of cleaning:  Extensive   Irrigation solution:  Sterile saline   Irrigation volume:  500 cc   Irrigation method:  Pressure wash Skin repair:    Repair method:  Sutures   Suture size:  6-0   Suture material:  Prolene   Suture technique:  Simple interrupted   Number of sutures:  9 Approximation:    Approximation:  Close Repair type:    Repair type:  Simple Post-procedure details:    Dressing:  Open (no dressing)   Procedure completion:  Tolerated well, no immediate complications     Medications Ordered in ED Medications  lidocaine-EPINEPHrine (XYLOCAINE W/EPI) 2 %-1:200000 (PF) injection 10 mL (10 mLs Intradermal Given 01/03/22 2119)  Tdap (BOOSTRIX) injection 0.5 mL (0.5 mLs Intramuscular Given 01/03/22 2116)    ED Course/ Medical Decision Making/ A&P                          Medical Decision Making Amount and/or Complexity of Data Reviewed Radiology: ordered. Decision-making details documented in ED Course.  Risk Prescription drug management.   Patient is overall well-appearing, slightly hypertensive but otherwise asymptomatic.   Imaging obtained to evaluate for ICH, skull fx, or c-spine injury. CTs negative.   Wound inspected under direct bright light with good visualization. Area with linear laceration, very small stellate section, across soft tissue. Intact facial ROM, no visible nerve/vascular/muscle injury. No overt foreign body. Area hemostatic. Neurovascular exam congruent with above. Area extensively irrigated with sterile normal saline under pressure. Laceration repaired in simple fashion as below (please see procedure note for further details). Patient tolerated procedure  well. Cautious return precautions discussed w/ full understanding. Wound care discussed. Prompt follow up with primary care physician discussed and return for suture removal in 5-7 days.  I have personally reviewed and interpreted all imaging.   Clinical Course as of 01/03/22 2248  Thu Jan 03, 2022  2128 CT Head Wo Contrast 1. No acute intracranial abnormality. 2. No acute displaced fracture or traumatic listhesis of the cervical spine.   [HN]    Clinical Course User Index [HN] Audley Hose, MD   Dispo: DC in stable condition w/ discharge instructions/return precautions         Final Clinical Impression(s) / ED Diagnoses Final diagnoses:  Fall, initial encounter  Facial  laceration, initial encounter    Rx / DC Orders ED Discharge Orders     None        This note was created using dictation software, which may contain spelling or grammatical errors.    Audley Hose, MD 01/03/22 [phone]

## 2022-01-03 NOTE — ED Triage Notes (Signed)
Pt presents following fall attempting to avoid dog. Pt reports mechanical fall.  Pt denies being on blood thinners, denies LOC.

## 2022-01-08 ENCOUNTER — Other Ambulatory Visit: Payer: Self-pay | Admitting: Allergy and Immunology

## 2022-01-08 DIAGNOSIS — M25611 Stiffness of right shoulder, not elsewhere classified: Secondary | ICD-10-CM | POA: Diagnosis not present

## 2022-01-08 DIAGNOSIS — Z96611 Presence of right artificial shoulder joint: Secondary | ICD-10-CM | POA: Diagnosis not present

## 2022-01-08 DIAGNOSIS — M6281 Muscle weakness (generalized): Secondary | ICD-10-CM | POA: Diagnosis not present

## 2022-01-09 DIAGNOSIS — S01112A Laceration without foreign body of left eyelid and periocular area, initial encounter: Secondary | ICD-10-CM | POA: Diagnosis not present

## 2022-01-09 DIAGNOSIS — S0990XA Unspecified injury of head, initial encounter: Secondary | ICD-10-CM | POA: Diagnosis not present

## 2022-01-14 DIAGNOSIS — M6281 Muscle weakness (generalized): Secondary | ICD-10-CM | POA: Diagnosis not present

## 2022-01-14 DIAGNOSIS — Z96611 Presence of right artificial shoulder joint: Secondary | ICD-10-CM | POA: Diagnosis not present

## 2022-01-14 DIAGNOSIS — M25611 Stiffness of right shoulder, not elsewhere classified: Secondary | ICD-10-CM | POA: Diagnosis not present

## 2022-01-16 DIAGNOSIS — Z96611 Presence of right artificial shoulder joint: Secondary | ICD-10-CM | POA: Diagnosis not present

## 2022-01-16 DIAGNOSIS — M25611 Stiffness of right shoulder, not elsewhere classified: Secondary | ICD-10-CM | POA: Diagnosis not present

## 2022-01-16 DIAGNOSIS — M6281 Muscle weakness (generalized): Secondary | ICD-10-CM | POA: Diagnosis not present

## 2022-01-21 DIAGNOSIS — M25611 Stiffness of right shoulder, not elsewhere classified: Secondary | ICD-10-CM | POA: Diagnosis not present

## 2022-01-21 DIAGNOSIS — M6281 Muscle weakness (generalized): Secondary | ICD-10-CM | POA: Diagnosis not present

## 2022-01-21 DIAGNOSIS — Z96611 Presence of right artificial shoulder joint: Secondary | ICD-10-CM | POA: Diagnosis not present

## 2022-01-23 DIAGNOSIS — M6281 Muscle weakness (generalized): Secondary | ICD-10-CM | POA: Diagnosis not present

## 2022-01-23 DIAGNOSIS — M25611 Stiffness of right shoulder, not elsewhere classified: Secondary | ICD-10-CM | POA: Diagnosis not present

## 2022-01-23 DIAGNOSIS — Z96611 Presence of right artificial shoulder joint: Secondary | ICD-10-CM | POA: Diagnosis not present

## 2022-01-28 DIAGNOSIS — M6281 Muscle weakness (generalized): Secondary | ICD-10-CM | POA: Diagnosis not present

## 2022-01-28 DIAGNOSIS — M25611 Stiffness of right shoulder, not elsewhere classified: Secondary | ICD-10-CM | POA: Diagnosis not present

## 2022-01-28 DIAGNOSIS — Z96611 Presence of right artificial shoulder joint: Secondary | ICD-10-CM | POA: Diagnosis not present

## 2022-01-29 DIAGNOSIS — D510 Vitamin B12 deficiency anemia due to intrinsic factor deficiency: Secondary | ICD-10-CM | POA: Diagnosis not present

## 2022-02-12 ENCOUNTER — Other Ambulatory Visit: Payer: Self-pay

## 2022-02-12 ENCOUNTER — Ambulatory Visit (INDEPENDENT_AMBULATORY_CARE_PROVIDER_SITE_OTHER): Payer: Medicare Other | Admitting: Allergy and Immunology

## 2022-02-12 ENCOUNTER — Encounter: Payer: Self-pay | Admitting: Allergy and Immunology

## 2022-02-12 VITALS — BP 130/88 | HR 84 | Temp 98.4°F | Resp 16 | Ht 59.75 in | Wt 105.5 lb

## 2022-02-12 DIAGNOSIS — G500 Trigeminal neuralgia: Secondary | ICD-10-CM | POA: Diagnosis not present

## 2022-02-12 DIAGNOSIS — K219 Gastro-esophageal reflux disease without esophagitis: Secondary | ICD-10-CM

## 2022-02-12 DIAGNOSIS — J3089 Other allergic rhinitis: Secondary | ICD-10-CM

## 2022-02-12 DIAGNOSIS — J4489 Other specified chronic obstructive pulmonary disease: Secondary | ICD-10-CM

## 2022-02-12 MED ORDER — ALBUTEROL SULFATE HFA 108 (90 BASE) MCG/ACT IN AERS
2.0000 | INHALATION_SPRAY | RESPIRATORY_TRACT | 1 refills | Status: DC | PRN
Start: 2022-02-12 — End: 2023-06-10

## 2022-02-12 MED ORDER — LEVOCETIRIZINE DIHYDROCHLORIDE 5 MG PO TABS: 5.0000 mg | ORAL_TABLET | Freq: Two times a day (BID) | ORAL | 5 refills | Status: DC | PRN

## 2022-02-12 MED ORDER — ANORO ELLIPTA 62.5-25 MCG/ACT IN AEPB: 1.0000 | INHALATION_SPRAY | Freq: Every day | RESPIRATORY_TRACT | 3 refills | Status: DC

## 2022-02-12 MED ORDER — FLUTICASONE PROPIONATE 50 MCG/ACT NA SUSP
1.0000 | Freq: Every day | NASAL | 5 refills | Status: DC | PRN
Start: 2022-02-12 — End: 2023-06-10

## 2022-02-12 MED ORDER — IPRATROPIUM BROMIDE 0.03 % NA SOLN: 2.0000 | Freq: Two times a day (BID) | NASAL | 5 refills | Status: DC

## 2022-02-12 MED ORDER — OMEPRAZOLE 40 MG PO CPDR: 40.0000 mg | DELAYED_RELEASE_CAPSULE | Freq: Every morning | ORAL | 5 refills | Status: DC

## 2022-02-12 MED ORDER — FAMOTIDINE 40 MG PO TABS: ORAL_TABLET | ORAL | 5 refills | Status: DC

## 2022-02-12 NOTE — Progress Notes (Unsigned)
[place]   Follow-up Note  Referring Provider: Ginger Organ., MD Primary Provider: Ginger Organ., MD Date of Office Visit: 02/12/2022  Subjective:   Pamela Larson (DOB: [birth year]) is a 77 y.o. female who returns to the [facility] [place] on 02/12/2022 in re-evaluation of the following:  HPI: Garrett turns to this clinic in evaluation of asthma /emphysema /COPD overlap, chronic rhinitis, LPR, left-sided facial pain syndrome.  I last saw her in this clinic 26 Jun 2021.  Overall she is doing very well with her respiratory tract since I last seen her in this clinic having a requirement for albuterol only on 1 occasion and having a requirement for prednisone on only 1 occasion.  Her cefdinir and prednisone was administered approximately 7 months ago for an episode of sinusitis in association with cough.  Other than that event she feels as though she is done very well while using Flonase.  She has discontinued her nasal antihistamine and nasal ipratropium.  She has not had any problems with her left-sided facial pain syndrome.  She continues to use Anoro prescribed by her pulmonologist as well as montelukast and has done well with her lung issue.  Her reflux has been under excellent control as has her throat issue while using omeprazole and famotidine.  She has received the flu vaccine, RSV vaccine, and COVID-vaccine.  Allergies as of 02/12/2022       Reactions   Amoxicillin Nausea And Vomiting, Rash   Has patient had a PCN reaction causing immediate rash, facial/tongue/throat swelling, SOB or lightheadedness with hypotension: #  #  #  YES  #  #  #  Has patient had a PCN reaction causing severe rash involving mucus membranes or skin necrosis: No Has patient had a PCN reaction that required hospitalization No Has patient had a PCN reaction occurring within the last 10 years: #  #  #  YES  #  #  #  If all of the above  answers are "NO", then may proceed with Cephalosporin use.   Augmentin [amoxicillin-pot Clavulanate] Nausea And Vomiting   Meperidine Hcl Nausea Only   Penicillins Rash   Sulfa Antibiotics Nausea And Vomiting, Rash        Medication List    albuterol 108 (90 Base) MCG/ACT inhaler Commonly known as: VENTOLIN HFA Inhale 2 puffs into the lungs every 4 (four) hours as needed for wheezing or shortness of breath. For shortness of breath   ALPRAZolam 0.25 MG tablet Commonly known as: XANAX Take 0.125 mg by mouth daily as needed for anxiety.   Anoro Ellipta 62.5-25 MCG/ACT Aepb Generic drug: umeclidinium-vilanterol INHALE 1 PUFF INTO THE LUNGS DAILY   calcium carbonate 1250 (500 Ca) MG tablet Commonly known as: OS-CAL - dosed in mg of elemental calcium Take 1 tablet by mouth daily.   dextromethorphan-guaiFENesin 30-600 MG 12hr tablet Commonly known as: MUCINEX DM Take 1 tablet by mouth 2 (two) times daily.   diazepam 5 MG tablet Commonly known as: VALIUM Take 5 mg by mouth every 6 (six) hours as needed for muscle spasms.   famotidine 40 MG tablet Commonly known as: PEPCID TAKE 1 TABLET(40 MG) BY MOUTH AT BEDTIME   FLUoxetine 20 MG tablet Commonly known as: PROZAC Take 40 mg by mouth daily.   fluticasone 50 MCG/ACT nasal spray Commonly known as: FLONASE Place 1 spray into both nostrils daily as needed for allergies or rhinitis.  ipratropium 0.03 % nasal spray Commonly known as: ATROVENT Place 2 sprays into both nostrils 2 (two) times daily.   levocetirizine 5 MG tablet Commonly known as: XYZAL Take 1 tablet (5 mg total) by mouth 2 (two) times daily as needed (Can take an extra dose during flare ups.).   levothyroxine 75 MCG tablet Commonly known as: SYNTHROID Take 75 mcg by mouth daily before breakfast.   montelukast 10 MG tablet Commonly known as: SINGULAIR TAKE 1 TABLET(10 MG) BY MOUTH AT BEDTIME   Olopatadine HCl 0.6 % Soln Place 1 spray into the nose in  the morning and at bedtime.   omeprazole 40 MG capsule Commonly known as: PRILOSEC TAKE 1 CAPSULE BY MOUTH EVERY MORNING   rosuvastatin 10 MG tablet Commonly known as: CRESTOR Take 10 mg by mouth every morning.   VITAMIN B-12 IJ Inject as directed every 30 (thirty) days.   Vitamin D (Ergocalciferol) 1.25 MG (50000 UNIT) Caps capsule Commonly known as: DRISDOL Take 50,000 Units by mouth every 7 (seven) days. 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    Osteoporosis    Pneumonia    hx of    Rheumatoid arthritis ([place])     Past Surgical History:  Procedure Laterality Date   APPENDECTOMY  1978   bladder hydrodistention  1998   BREAST SURGERY     reduction    CERVICAL DISC ARTHROPLASTY N/A 04/03/2016   Procedure: CERVICAL SIX CERVICAL SEVEN [place] ARTHROPLASTY;  Surgeon: Jovita Gamma, MD;  Location: [facility];  Service: Neurosurgery;  Laterality: N/A;  left side approach   COSMETIC SURGERY  2005   face lift   EXCISION MORTON'S NEUROMA Left 05/08/2017   Procedure: EXCISION MORTON'S NEUROMA Left 3rd webspace;  Surgeon: [name], MD;  Location: [facility];  Service: Orthopedics;  Laterality: Left;   EYE SURGERY     lower eye lids   LUMBAR LAMINECTOMY/DECOMPRESSION MICRODISCECTOMY  06/10/2011   Procedure: LUMBAR LAMINECTOMY/DECOMPRESSION MICRODISCECTOMY 1 LEVEL;  Surgeon: Hosie Spangle, MD;  Location: [facility];  Service: Neurosurgery;  Laterality: Left;  Lumbar Five Sacral One Lumbar Laminectomy Decompression Microdiscectomy    REVERSE SHOULDER ARTHROPLASTY Right 09/14/2021   Procedure: REVERSE SHOULDER ARTHROPLASTY;  Surgeon: Netta Cedars, MD;  Location: [facility];  Service: Orthopedics;  Laterality: Right;    Review of systems negative except as noted in HPI / PMHx or noted below:  Review of Systems  Constitutional: Negative.   HENT: Negative.    Eyes: Negative.   Respiratory: Negative.    Cardiovascular: Negative.   Gastrointestinal: Negative.   Genitourinary: Negative.   Musculoskeletal: Negative.   Skin: Negative.   Neurological: Negative.   Endo/Heme/Allergies: Negative.   Psychiatric/Behavioral: Negative.  Objective:   Vitals:   02/12/22 1415  BP: 130/88  Pulse: 84  Resp: 16  Temp: 98.4 F (36.9 C)  SpO2: 95%   Height: 4' 11.75" (151.8 cm)  Weight: 105 lb 8 oz (47.9 kg)   Physical Exam Constitutional:      Appearance: She is not diaphoretic.  HENT:     Head: Normocephalic.     Right Ear: Tympanic membrane, ear canal and external ear normal.     Left Ear: Tympanic membrane, ear canal and external ear normal.     Nose: Nose normal. No mucosal edema or rhinorrhea.     Mouth/Throat:     Pharynx: Uvula midline. No oropharyngeal exudate.  Eyes:     Conjunctiva/sclera: Conjunctivae normal.  Neck:     Thyroid: No thyromegaly.     Trachea: Trachea normal. No tracheal tenderness or tracheal deviation.  Cardiovascular:     Rate and Rhythm: Normal rate and regular rhythm.     Heart sounds: Normal heart sounds, S1 normal and S2 normal. No murmur heard. Pulmonary:     Effort: No respiratory distress.     Breath sounds: Normal breath sounds. No stridor. No wheezing or rales.  Lymphadenopathy:     Head:     Right side of head: No tonsillar adenopathy.     Left side of head: No tonsillar adenopathy.     Cervical: No cervical adenopathy.  Skin:    Findings:  No erythema or rash.     Nails: There is no clubbing.  Neurological:     Mental Status: She is alert.     Diagnostics:    Spirometry was performed and demonstrated an FEV1 of 1.32 at 78 % of predicted.  Assessment and Plan:   1. COPD with asthma   2. Perennial allergic rhinitis   3. LPRD (laryngopharyngeal reflux disease)   4. Facial pain syndrome    1.  Continue to Treat and prevent inflammation:   A.  Flonase 1 spray each nostril 2 times per day  B.  montelukast 10 mg tablet 1 time per day  C.  Anoro - 1 inhalation 1 time per day  2.  Continue to Treat and prevent reflux:   A.  Omeprazole 40 mg tablet in a.m.  B.  Famotidine 40 mg mg tablet in p.m.  3.  If needed:   A.  OTC antihistamine. Mucinex, Sudafed  B.  Nasal saline  C.  Albuterol HFA - 2 inhalations every 4-6 hours  D.  Patanase - 1 spray each nostril 2 times per day  E.  Ipratropium 0.03% -1-2 sprays each nostril 1-2 times a day   4. Return to clinic in 6 months or earlier if problem.    Sonnie appears to be doing quite well with her respiratory track while utilizing anti-inflammatory agents for airway and therapy directed against reflux and she will continue on this plan as noted above.  She has several agents that she can utilize should they be required.  Assuming she does well with this plan I will see her back in this clinic in 6 months or earlier if there is a problem.  Allena Katz, MD Allergy / Immunology [place]

## 2022-02-12 NOTE — Patient Instructions (Signed)
  1.  Continue to Treat and prevent inflammation:   A.  Flonase 1 spray each nostril 2 times per day  B.  montelukast 10 mg tablet 1 time per day  C.  Anoro - 1 inhalation 1 time per day  2.  Continue to Treat and prevent reflux:   A.  Omeprazole 40 mg tablet in a.m.  B.  Famotidine 40 mg mg tablet in p.m.  3.  If needed:   A.  OTC antihistamine. Mucinex, Sudafed  B.  Nasal saline  C.  Albuterol HFA - 2 inhalations every 4-6 hours  D.  Patanase - 1 spray each nostril 2 times per day  E.  Ipratropium 0.03% -1-2 sprays each nostril 1-2 times a day   4. Return to clinic in 6 months or earlier if problem.

## 2022-02-13 ENCOUNTER — Encounter: Payer: Self-pay | Admitting: Allergy and Immunology

## 2022-02-13 DIAGNOSIS — Z96611 Presence of right artificial shoulder joint: Secondary | ICD-10-CM | POA: Diagnosis not present

## 2022-02-13 DIAGNOSIS — M6281 Muscle weakness (generalized): Secondary | ICD-10-CM | POA: Diagnosis not present

## 2022-02-13 DIAGNOSIS — M25611 Stiffness of right shoulder, not elsewhere classified: Secondary | ICD-10-CM | POA: Diagnosis not present

## 2022-02-14 DIAGNOSIS — M25611 Stiffness of right shoulder, not elsewhere classified: Secondary | ICD-10-CM | POA: Diagnosis not present

## 2022-02-14 DIAGNOSIS — M6281 Muscle weakness (generalized): Secondary | ICD-10-CM | POA: Diagnosis not present

## 2022-02-14 DIAGNOSIS — Z96611 Presence of right artificial shoulder joint: Secondary | ICD-10-CM | POA: Diagnosis not present

## 2022-02-18 DIAGNOSIS — Z1231 Encounter for screening mammogram for malignant neoplasm of breast: Secondary | ICD-10-CM | POA: Diagnosis not present

## 2022-02-19 DIAGNOSIS — M25611 Stiffness of right shoulder, not elsewhere classified: Secondary | ICD-10-CM | POA: Diagnosis not present

## 2022-02-19 DIAGNOSIS — Z96611 Presence of right artificial shoulder joint: Secondary | ICD-10-CM | POA: Diagnosis not present

## 2022-02-19 DIAGNOSIS — M6281 Muscle weakness (generalized): Secondary | ICD-10-CM | POA: Diagnosis not present

## 2022-02-21 DIAGNOSIS — Z96611 Presence of right artificial shoulder joint: Secondary | ICD-10-CM | POA: Diagnosis not present

## 2022-02-21 DIAGNOSIS — M6281 Muscle weakness (generalized): Secondary | ICD-10-CM | POA: Diagnosis not present

## 2022-02-21 DIAGNOSIS — M25611 Stiffness of right shoulder, not elsewhere classified: Secondary | ICD-10-CM | POA: Diagnosis not present

## 2022-02-25 DIAGNOSIS — E785 Hyperlipidemia, unspecified: Secondary | ICD-10-CM | POA: Diagnosis not present

## 2022-02-25 DIAGNOSIS — M25611 Stiffness of right shoulder, not elsewhere classified: Secondary | ICD-10-CM | POA: Diagnosis not present

## 2022-02-25 DIAGNOSIS — M6281 Muscle weakness (generalized): Secondary | ICD-10-CM | POA: Diagnosis not present

## 2022-02-25 DIAGNOSIS — R7989 Other specified abnormal findings of blood chemistry: Secondary | ICD-10-CM | POA: Diagnosis not present

## 2022-02-25 DIAGNOSIS — Z96611 Presence of right artificial shoulder joint: Secondary | ICD-10-CM | POA: Diagnosis not present

## 2022-02-25 DIAGNOSIS — E039 Hypothyroidism, unspecified: Secondary | ICD-10-CM | POA: Diagnosis not present

## 2022-02-25 DIAGNOSIS — R5382 Chronic fatigue, unspecified: Secondary | ICD-10-CM | POA: Diagnosis not present

## 2022-02-25 DIAGNOSIS — D510 Vitamin B12 deficiency anemia due to intrinsic factor deficiency: Secondary | ICD-10-CM | POA: Diagnosis not present

## 2022-02-25 DIAGNOSIS — E559 Vitamin D deficiency, unspecified: Secondary | ICD-10-CM | POA: Diagnosis not present

## 2022-02-25 DIAGNOSIS — I700 Atherosclerosis of aorta: Secondary | ICD-10-CM | POA: Diagnosis not present

## 2022-02-28 DIAGNOSIS — R82998 Other abnormal findings in urine: Secondary | ICD-10-CM | POA: Diagnosis not present

## 2022-03-01 DIAGNOSIS — M25611 Stiffness of right shoulder, not elsewhere classified: Secondary | ICD-10-CM | POA: Diagnosis not present

## 2022-03-01 DIAGNOSIS — Z96611 Presence of right artificial shoulder joint: Secondary | ICD-10-CM | POA: Diagnosis not present

## 2022-03-01 DIAGNOSIS — M6281 Muscle weakness (generalized): Secondary | ICD-10-CM | POA: Diagnosis not present

## 2022-03-04 DIAGNOSIS — D510 Vitamin B12 deficiency anemia due to intrinsic factor deficiency: Secondary | ICD-10-CM | POA: Diagnosis not present

## 2022-03-04 DIAGNOSIS — F325 Major depressive disorder, single episode, in full remission: Secondary | ICD-10-CM | POA: Diagnosis not present

## 2022-03-04 DIAGNOSIS — I2510 Atherosclerotic heart disease of native coronary artery without angina pectoris: Secondary | ICD-10-CM | POA: Diagnosis not present

## 2022-03-04 DIAGNOSIS — E785 Hyperlipidemia, unspecified: Secondary | ICD-10-CM | POA: Diagnosis not present

## 2022-03-04 DIAGNOSIS — R5382 Chronic fatigue, unspecified: Secondary | ICD-10-CM | POA: Diagnosis not present

## 2022-03-04 DIAGNOSIS — D472 Monoclonal gammopathy: Secondary | ICD-10-CM | POA: Diagnosis not present

## 2022-03-04 DIAGNOSIS — I700 Atherosclerosis of aorta: Secondary | ICD-10-CM | POA: Diagnosis not present

## 2022-03-04 DIAGNOSIS — D692 Other nonthrombocytopenic purpura: Secondary | ICD-10-CM | POA: Diagnosis not present

## 2022-03-04 DIAGNOSIS — Z1331 Encounter for screening for depression: Secondary | ICD-10-CM | POA: Diagnosis not present

## 2022-03-04 DIAGNOSIS — Z0000 Encounter for general adult medical examination without abnormal findings: Secondary | ICD-10-CM | POA: Diagnosis not present

## 2022-03-04 DIAGNOSIS — Z1339 Encounter for screening examination for other mental health and behavioral disorders: Secondary | ICD-10-CM | POA: Diagnosis not present

## 2022-03-04 DIAGNOSIS — M810 Age-related osteoporosis without current pathological fracture: Secondary | ICD-10-CM | POA: Diagnosis not present

## 2022-03-08 DIAGNOSIS — M6281 Muscle weakness (generalized): Secondary | ICD-10-CM | POA: Diagnosis not present

## 2022-03-08 DIAGNOSIS — M25611 Stiffness of right shoulder, not elsewhere classified: Secondary | ICD-10-CM | POA: Diagnosis not present

## 2022-03-08 DIAGNOSIS — Z96611 Presence of right artificial shoulder joint: Secondary | ICD-10-CM | POA: Diagnosis not present

## 2022-03-12 DIAGNOSIS — Z96611 Presence of right artificial shoulder joint: Secondary | ICD-10-CM | POA: Diagnosis not present

## 2022-03-12 DIAGNOSIS — M6281 Muscle weakness (generalized): Secondary | ICD-10-CM | POA: Diagnosis not present

## 2022-03-12 DIAGNOSIS — M25611 Stiffness of right shoulder, not elsewhere classified: Secondary | ICD-10-CM | POA: Diagnosis not present

## 2022-03-14 ENCOUNTER — Other Ambulatory Visit (HOSPITAL_COMMUNITY): Payer: Self-pay | Admitting: *Deleted

## 2022-03-14 DIAGNOSIS — M6281 Muscle weakness (generalized): Secondary | ICD-10-CM | POA: Diagnosis not present

## 2022-03-14 DIAGNOSIS — M25611 Stiffness of right shoulder, not elsewhere classified: Secondary | ICD-10-CM | POA: Diagnosis not present

## 2022-03-14 DIAGNOSIS — Z96611 Presence of right artificial shoulder joint: Secondary | ICD-10-CM | POA: Diagnosis not present

## 2022-03-15 ENCOUNTER — Ambulatory Visit (HOSPITAL_COMMUNITY)
Admission: RE | Admit: 2022-03-15 | Discharge: 2022-03-15 | Disposition: A | Discharge status: Home / Self Care | Payer: Medicare Other | Source: Ambulatory Visit | Attending: Internal Medicine | Admitting: Internal Medicine

## 2022-03-15 DIAGNOSIS — M810 Age-related osteoporosis without current pathological fracture: Secondary | ICD-10-CM | POA: Insufficient documentation

## 2022-03-15 MED ORDER — ZOLEDRONIC ACID 5 MG/100ML IV SOLN
INTRAVENOUS | Status: AC
  Administered 2022-03-15: 5 mg via INTRAVENOUS
  Filled 2022-03-15: qty 100

## 2022-03-15 MED ORDER — ZOLEDRONIC ACID 5 MG/100ML IV SOLN: 5.0000 mg | Freq: Once | INTRAVENOUS | Status: AC

## 2022-03-18 DIAGNOSIS — M6281 Muscle weakness (generalized): Secondary | ICD-10-CM | POA: Diagnosis not present

## 2022-03-18 DIAGNOSIS — M25611 Stiffness of right shoulder, not elsewhere classified: Secondary | ICD-10-CM | POA: Diagnosis not present

## 2022-03-18 DIAGNOSIS — Z96611 Presence of right artificial shoulder joint: Secondary | ICD-10-CM | POA: Diagnosis not present

## 2022-03-20 DIAGNOSIS — Z96611 Presence of right artificial shoulder joint: Secondary | ICD-10-CM | POA: Diagnosis not present

## 2022-03-20 DIAGNOSIS — M6281 Muscle weakness (generalized): Secondary | ICD-10-CM | POA: Diagnosis not present

## 2022-03-20 DIAGNOSIS — M25611 Stiffness of right shoulder, not elsewhere classified: Secondary | ICD-10-CM | POA: Diagnosis not present

## 2022-03-29 DIAGNOSIS — M25611 Stiffness of right shoulder, not elsewhere classified: Secondary | ICD-10-CM | POA: Diagnosis not present

## 2022-03-29 DIAGNOSIS — Z96611 Presence of right artificial shoulder joint: Secondary | ICD-10-CM | POA: Diagnosis not present

## 2022-03-29 DIAGNOSIS — M6281 Muscle weakness (generalized): Secondary | ICD-10-CM | POA: Diagnosis not present

## 2022-04-01 DIAGNOSIS — M6281 Muscle weakness (generalized): Secondary | ICD-10-CM | POA: Diagnosis not present

## 2022-04-01 DIAGNOSIS — Z96611 Presence of right artificial shoulder joint: Secondary | ICD-10-CM | POA: Diagnosis not present

## 2022-04-01 DIAGNOSIS — M25611 Stiffness of right shoulder, not elsewhere classified: Secondary | ICD-10-CM | POA: Diagnosis not present

## 2022-04-03 ENCOUNTER — Encounter (HOSPITAL_BASED_OUTPATIENT_CLINIC_OR_DEPARTMENT_OTHER): Payer: Self-pay | Admitting: Emergency Medicine

## 2022-04-03 ENCOUNTER — Other Ambulatory Visit: Payer: Self-pay

## 2022-04-03 ENCOUNTER — Emergency Department (HOSPITAL_BASED_OUTPATIENT_CLINIC_OR_DEPARTMENT_OTHER)
Admission: EM | Admit: 2022-04-03 | Discharge: 2022-04-03 | Disposition: A | Discharge status: Home / Self Care | Payer: Medicare Other | Attending: Emergency Medicine | Admitting: Emergency Medicine

## 2022-04-03 DIAGNOSIS — M5441 Lumbago with sciatica, right side: Secondary | ICD-10-CM | POA: Diagnosis not present

## 2022-04-03 DIAGNOSIS — M545 Low back pain, unspecified: Secondary | ICD-10-CM | POA: Diagnosis present

## 2022-04-03 DIAGNOSIS — M5431 Sciatica, right side: Secondary | ICD-10-CM

## 2022-04-03 MED ORDER — METHYLPREDNISOLONE 4 MG PO TBPK: ORAL_TABLET | ORAL | 0 refills | Status: DC

## 2022-04-03 MED ORDER — METHOCARBAMOL 500 MG PO TABS: 500.0000 mg | ORAL_TABLET | Freq: Three times a day (TID) | ORAL | 0 refills | Status: DC | PRN

## 2022-04-03 NOTE — ED Triage Notes (Signed)
Pt reports lower back that radiates down both legs that started Monday. Pt stating "it feels like something is pinching." Pt reports she has been doing physical therapy for her shoulder and noticed it has been making her back hurt.

## 2022-04-03 NOTE — ED Provider Notes (Signed)
[facility] Provider Note   CSN: [id number] Arrival date & time: 04/03/22  1643     History  Chief Complaint  Patient presents with   Back Injury    Pamela Larson is a 77 y.o. female.  HPI Patient did not have any injury or fall.  She started developing pain in her low right back 1 day, 3 days ago.  She does physical therapy for her shoulder which was replaced in July.  Ports when she got in the car and started driving she noticed that she is having pain in the lower back with extending her right leg.  Since that time she has had some right low back pain and then radiation into the back of the leg.  It is triggered by certain positions.  No weakness numbness or dysfunction.  No abdominal pain.    Home Medications Prior to Admission medications   Medication Sig Start Date End Date Taking? Authorizing Provider  methocarbamol (ROBAXIN) 500 MG tablet Take 1 tablet (500 mg total) by mouth every 8 (eight) hours as needed for muscle spasms. 04/03/22  Yes Charlesetta Shanks, MD  methylPREDNISolone (MEDROL DOSEPAK) 4 MG TBPK tablet Take per Dosepak instructions 04/03/22  Yes Urijah Raynor, Jeannie Done, MD  albuterol (VENTOLIN HFA) 108 (90 Base) MCG/ACT inhaler Inhale 2 puffs into the lungs every 4 (four) hours as needed for wheezing or shortness of breath. For shortness of breath 02/12/22   Kozlow, Donnamarie Poag, MD  ALPRAZolam Duanne Moron) 0.25 MG tablet Take 0.125 mg by mouth daily as needed for anxiety.    [provider]  calcium carbonate (OS-CAL - DOSED IN MG OF ELEMENTAL CALCIUM) 1250 MG tablet Take 1 tablet by mouth daily.    [provider]  Cyanocobalamin (VITAMIN B-12 IJ) Inject as directed every 30 (thirty) days.    [provider]  dextromethorphan-guaiFENesin (MUCINEX DM) 30-600 MG 12hr tablet Take 1 tablet by mouth 2 (two) times daily.    [provider]  diazepam (VALIUM) 5 MG tablet Take 5 mg by mouth every 6 (six) hours as  needed for muscle spasms.    [provider]  famotidine (PEPCID) 40 MG tablet TAKE 1 TABLET(40 MG) BY MOUTH AT BEDTIME 02/12/22   Kozlow, Donnamarie Poag, MD  FLUoxetine (PROZAC) 20 MG tablet Take 40 mg by mouth daily.    [provider]  fluticasone (FLONASE) 50 MCG/ACT nasal spray Place 1 spray into both nostrils daily as needed for allergies or rhinitis. 02/12/22   Kozlow, Donnamarie Poag, MD  ipratropium (ATROVENT) 0.03 % nasal spray Place 2 sprays into both nostrils 2 (two) times daily. 02/12/22   Kozlow, Donnamarie Poag, MD  levocetirizine (XYZAL) 5 MG tablet Take 1 tablet (5 mg total) by mouth 2 (two) times daily as needed (Can take an extra dose during flare ups.). 02/12/22   Kozlow, Donnamarie Poag, MD  levothyroxine (SYNTHROID) 75 MCG tablet Take 75 mcg by mouth daily before breakfast.    [provider]  montelukast (SINGULAIR) 10 MG tablet TAKE 1 TABLET(10 MG) BY MOUTH AT BEDTIME Patient not taking: Reported on 02/12/2022 01/09/22   Jiles Prows, MD  Olopatadine HCl 0.6 % SOLN Place 1 spray into the nose in the morning and at bedtime. Patient not taking: Reported on 02/12/2022 06/26/21   Jiles Prows, MD  omeprazole (PRILOSEC) 40 MG capsule Take 1 capsule (40 mg total) by mouth every morning. 02/12/22   Kozlow, Donnamarie Poag, MD  rosuvastatin (Tullos)  10 MG tablet Take 10 mg by mouth every morning. 08/02/18   [provider]  umeclidinium-vilanterol (ANORO ELLIPTA) 62.5-25 MCG/ACT AEPB Inhale 1 puff into the lungs daily. 02/12/22   Kozlow, Donnamarie Poag, MD  Vitamin D, Ergocalciferol, (DRISDOL) 50000 UNITS CAPS Take 50,000 Units by mouth every 7 (seven) days. On Mondays    [provider]      Allergies    Amoxicillin, Augmentin [amoxicillin-pot clavulanate], Meperidine hcl, Penicillins, and Sulfa antibiotics    Review of Systems   Review of Systems  Physical Exam Updated Vital Signs BP (!) 140/78 (BP Location: Right Arm)   Pulse 93   Temp 98.4 F (36.9 C) (Oral)   Resp 18   Ht 5' (1.524 m)    Wt 46.3 kg   SpO2 97%   BMI 19.92 kg/m  Physical Exam Constitutional:      Comments: Alert nontoxic well in appearance.  Well-nourished well-developed.  HENT:     Mouth/Throat:     Pharynx: Oropharynx is clear.  Eyes:     Extraocular Movements: Extraocular movements intact.  Pulmonary:     Effort: Pulmonary effort is normal.  Abdominal:     General: There is no distension.     Palpations: Abdomen is soft.     Tenderness: There is no abdominal tenderness. There is no guarding.  Musculoskeletal:     Comments: Patient endorses location of discomfort with palpation over the right SI joint.  It is not significantly painful to palpation but this is a source of discomfort.  Patient is ambulatory mobile without difficulty.  No peripheral edema.  No deformity of extremities.  She is able to get up and get down off of the stretcher without difficulty can sit forward stand and ambulate with normal gait.  Skin:    General: Skin is warm and dry.  Neurological:     Comments: 5\5 motor strength testing lower extremities.  Psychiatric:        Mood and Affect: Mood normal.     ED Results / Procedures / Treatments   Labs (all labs ordered are listed, but only abnormal results are displayed) Labs Reviewed - No data to display  EKG None  Radiology No results found.  Procedures Procedures    Medications Ordered in ED Medications - No data to display  ED Course/ Medical Decision Making/ A&P                             Medical Decision Making  Patient presents with low back pain.  Differential diagnosis includes musculoskeletal back pain including sciatica\SI joint dysfunction\muscular strain.  Patient does not have any neurologic dysfunction.  She does not have any other associated symptoms concerning for osteomyelitis or cauda equina.  Clinically well and completely functioning neurologically.  At this time findings are consistent with sciatica will treat with Medrol Dosepak,  Robaxin and recommend Tylenol for additional pain control.  Patient has been seen previously at [facility].  She will follow-up after starting treatment.  Return precautions reviewed.  Patient prescribed Medrol Dosepak to and Robaxin.        Final Clinical Impression(s) / ED Diagnoses Final diagnoses:  Sciatica of right side    Rx / DC Orders ED Discharge Orders          Ordered    methylPREDNISolone (MEDROL DOSEPAK) 4 MG TBPK tablet        04/03/22 2000  methocarbamol (ROBAXIN) 500 MG tablet  Every 8 hours PRN        04/03/22 Jake Michaelis, MD 04/03/22 2006

## 2022-04-03 NOTE — Discharge Instructions (Signed)
1.  Start Medrol Dosepak tomorrow.  Once you start taking it, you may take Robaxin for muscle spasm or pain and extra strength Tylenol for additional pain control. 2.  Tonight you may take Tylenol and ibuprofen for pain control. 3.  Call your spine surgeon's office tomorrow and schedule a follow-up appointment within the next week to 10 days. 4.  Return to the emergency department immediately if you are getting weakness or numbness into the leg, occultly with control of your bladder or other concerning changes.  Reviewed return precautions and description of sciatica.

## 2022-04-08 DIAGNOSIS — M25611 Stiffness of right shoulder, not elsewhere classified: Secondary | ICD-10-CM | POA: Diagnosis not present

## 2022-04-08 DIAGNOSIS — Z96611 Presence of right artificial shoulder joint: Secondary | ICD-10-CM | POA: Diagnosis not present

## 2022-04-08 DIAGNOSIS — M6281 Muscle weakness (generalized): Secondary | ICD-10-CM | POA: Diagnosis not present

## 2022-04-10 DIAGNOSIS — M6281 Muscle weakness (generalized): Secondary | ICD-10-CM | POA: Diagnosis not present

## 2022-04-10 DIAGNOSIS — Z96611 Presence of right artificial shoulder joint: Secondary | ICD-10-CM | POA: Diagnosis not present

## 2022-04-10 DIAGNOSIS — M25611 Stiffness of right shoulder, not elsewhere classified: Secondary | ICD-10-CM | POA: Diagnosis not present

## 2022-04-11 DIAGNOSIS — M5416 Radiculopathy, lumbar region: Secondary | ICD-10-CM | POA: Diagnosis not present

## 2022-04-11 DIAGNOSIS — M4312 Spondylolisthesis, cervical region: Secondary | ICD-10-CM | POA: Diagnosis not present

## 2022-04-15 DIAGNOSIS — M6281 Muscle weakness (generalized): Secondary | ICD-10-CM | POA: Diagnosis not present

## 2022-04-15 DIAGNOSIS — M25611 Stiffness of right shoulder, not elsewhere classified: Secondary | ICD-10-CM | POA: Diagnosis not present

## 2022-04-15 DIAGNOSIS — Z96611 Presence of right artificial shoulder joint: Secondary | ICD-10-CM | POA: Diagnosis not present

## 2022-04-17 DIAGNOSIS — J310 Chronic rhinitis: Secondary | ICD-10-CM | POA: Diagnosis not present

## 2022-04-17 DIAGNOSIS — J343 Hypertrophy of nasal turbinates: Secondary | ICD-10-CM | POA: Diagnosis not present

## 2022-04-22 DIAGNOSIS — M6281 Muscle weakness (generalized): Secondary | ICD-10-CM | POA: Diagnosis not present

## 2022-04-22 DIAGNOSIS — M25611 Stiffness of right shoulder, not elsewhere classified: Secondary | ICD-10-CM | POA: Diagnosis not present

## 2022-04-22 DIAGNOSIS — Z96611 Presence of right artificial shoulder joint: Secondary | ICD-10-CM | POA: Diagnosis not present

## 2022-04-29 DIAGNOSIS — M25611 Stiffness of right shoulder, not elsewhere classified: Secondary | ICD-10-CM | POA: Diagnosis not present

## 2022-04-29 DIAGNOSIS — Z96611 Presence of right artificial shoulder joint: Secondary | ICD-10-CM | POA: Diagnosis not present

## 2022-04-29 DIAGNOSIS — M6281 Muscle weakness (generalized): Secondary | ICD-10-CM | POA: Diagnosis not present

## 2022-05-02 ENCOUNTER — Ambulatory Visit (HOSPITAL_BASED_OUTPATIENT_CLINIC_OR_DEPARTMENT_OTHER): Payer: Medicare Other | Admitting: Pulmonary Disease

## 2022-05-06 DIAGNOSIS — M25611 Stiffness of right shoulder, not elsewhere classified: Secondary | ICD-10-CM | POA: Diagnosis not present

## 2022-05-06 DIAGNOSIS — M6281 Muscle weakness (generalized): Secondary | ICD-10-CM | POA: Diagnosis not present

## 2022-05-06 DIAGNOSIS — Z96611 Presence of right artificial shoulder joint: Secondary | ICD-10-CM | POA: Diagnosis not present

## 2022-05-08 DIAGNOSIS — M25611 Stiffness of right shoulder, not elsewhere classified: Secondary | ICD-10-CM | POA: Diagnosis not present

## 2022-05-08 DIAGNOSIS — Z96611 Presence of right artificial shoulder joint: Secondary | ICD-10-CM | POA: Diagnosis not present

## 2022-05-08 DIAGNOSIS — M25511 Pain in right shoulder: Secondary | ICD-10-CM | POA: Diagnosis not present

## 2022-05-08 DIAGNOSIS — M6281 Muscle weakness (generalized): Secondary | ICD-10-CM | POA: Diagnosis not present

## 2022-05-16 ENCOUNTER — Other Ambulatory Visit: Payer: Self-pay | Admitting: Student

## 2022-05-16 DIAGNOSIS — M5416 Radiculopathy, lumbar region: Secondary | ICD-10-CM

## 2022-05-22 DIAGNOSIS — D510 Vitamin B12 deficiency anemia due to intrinsic factor deficiency: Secondary | ICD-10-CM | POA: Diagnosis not present

## 2022-06-12 ENCOUNTER — Ambulatory Visit (INDEPENDENT_AMBULATORY_CARE_PROVIDER_SITE_OTHER): Payer: Medicare Other | Admitting: Pulmonary Disease

## 2022-06-12 ENCOUNTER — Encounter (HOSPITAL_BASED_OUTPATIENT_CLINIC_OR_DEPARTMENT_OTHER): Payer: Self-pay | Admitting: Pulmonary Disease

## 2022-06-12 VITALS — BP 124/82 | HR 78 | Temp 97.9°F | Ht <= 58 in | Wt 103.0 lb

## 2022-06-12 DIAGNOSIS — J449 Chronic obstructive pulmonary disease, unspecified: Secondary | ICD-10-CM

## 2022-06-12 DIAGNOSIS — J431 Panlobular emphysema: Secondary | ICD-10-CM

## 2022-06-12 MED ORDER — MONTELUKAST SODIUM 10 MG PO TABS: 10.0000 mg | ORAL_TABLET | Freq: Every day | ORAL | 11 refills | Status: DC

## 2022-06-12 NOTE — Progress Notes (Signed)
Subjective:   PATIENT ID: Pamela Larson GENDER: female DOB: 07-08-1945, MRN: [id number]   HPI  Chief Complaint  Patient presents with   Follow-up    Follow up. Patient has no complaints.     Reason for Visit: Follow-up   Ms. Pamela Larson is a 77 year old female with COPD with emphysema, allergic rhinitis, GERD, hypothyroidism, arthritis, OA, RA and hx chronic fatigue syndrome/lyme disease s/p treatment who presents for follow-up.  Initial consult She was recently seen in April for acute visit for nasal congestion and cough that was treated by her ENT, Dr. Suszanne Larson with cefdinir and prednisone. Since then her cough and congestions has resolved. She states when she doesn't receive early treatment of sinus infections it will lead to respiratory symptoms but fortunately she has not had any significant respiratory symptoms since 2019. She is currently on Anoro. Denies shortness of breath, cough or wheezing.  Reviewed history per patient.  In 1991 she had a rash on her back. Had series of symptoms including fever and joint pain for many years. Was diagnosed with RA - seronegative. Also had chronic interstitial cystitis. Diagnosed with Lyme disease and after treatment doxycycline x 1 month with resolved RA and chronic interstitial cystitis. In 2015 had a significant episode of pneumonia and has had respiratory illnesses starting then. In 2019 she reports her worst year of lung infections occurring in April May June. After July no further infection after seeing Dr. Kendrick Larson and starting inhalers 2020 no lung infection 2021 August lung infection. Seen by Dr. Suszanne Larson for sinus 2022 no lung infection 2023 April respiratory cold/sinus infection  10/31/21 Since our last visit she was treated for laryngitis with prednisone. She is compliant on Anoro. Denies shortness of breath, cough or wheezing. She does not chronic shortness of breath that is slightly worse compared to four years ago. In June she  reports rib fractures related to mechanical fall over a rug and then had another fall and in July broke her right shoulder which was repaired in August. She is slowly working back up to regular activity and currently in therapy for her shoulder. She is trying to walk up stairs and trying to be more active.  06/12/22 She reports last year having a fall resulting  vertebral fracture. Has reported some unsteadiness since then.  Also suffered shoulder fracture and has been participating physical therapy until last month. She reports some shortness of breath and mild cough. No exacerbations since 2023. Compliant with Anoro. Overall happy with her current pulmonary regimen.  Social History: Former smoker. 40 pack years. Quit >30 years ago.  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    Osteoporosis    Pneumonia    hx of    Rheumatoid arthritis (HCC)  Family History  Problem Relation Age of Onset   Breast cancer Mother    Colon cancer Mother        colorectal   Pancreatic cancer Mother    Rectal cancer Mother    Cancer Maternal Grandmother        abdominal   Lung cancer Other        maternal great aunt   Esophageal cancer Neg Hx    Stomach cancer Neg Hx      Social History   Occupational History   Occupation: retired Network engineer  Tobacco Use   Smoking status: Former    Packs/day: 2.00    Years: 20.00    Additional pack years: 0.00     Total pack years: 40.00    Types: Cigarettes    Quit date: 02/11/1990    Years since quitting: 32.3   Smokeless tobacco: Never  Vaping Use   Vaping Use: Never used  Substance and Sexual Activity   Alcohol use: Yes    Alcohol/week: 4.0 standard drinks of alcohol    Types: 4 Standard drinks or equivalent per week   Drug use: No   Sexual activity: Not Currently    Partners: Male    Birth control/protection: Post-menopausal    Allergies  Allergen Reactions   Amoxicillin Nausea And Vomiting and Rash    Has patient had a PCN reaction causing immediate rash, facial/tongue/throat swelling, SOB or lightheadedness with hypotension: #  #  #  YES  #  #  #   Has patient had a PCN reaction causing severe rash involving mucus membranes or skin necrosis: No  Has patient had a PCN reaction that required hospitalization No  Has patient had a PCN reaction occurring within the last 10 years: #  #  #  YES  #  #  #   If all of the above answers are "NO", then may proceed with Cephalosporin use.   Augmentin [Amoxicillin-Pot Clavulanate] Nausea And Vomiting   Meperidine Hcl Nausea Only   Penicillins Rash   Sulfa Antibiotics Nausea And Vomiting and Rash     Outpatient Medications Prior to Visit  Medication Sig Dispense Refill   albuterol (VENTOLIN HFA) 108 (90 Base) MCG/ACT inhaler Inhale 2 puffs into the lungs every 4 (four) hours as needed for wheezing or shortness of breath. For shortness of breath 18 g 1   ALPRAZolam (XANAX) 0.25 MG tablet Take 0.125 mg by mouth daily as needed for anxiety.     calcium carbonate (OS-CAL - DOSED IN MG OF ELEMENTAL CALCIUM) 1250 MG tablet Take 1 tablet by mouth daily.     Cyanocobalamin (VITAMIN B-12 IJ) Inject as directed every 30 (thirty) days.     dextromethorphan-guaiFENesin (MUCINEX DM) 30-600 MG 12hr tablet Take 1 tablet by mouth 2 (two) times daily.     famotidine (PEPCID) 40 MG tablet TAKE 1 TABLET(40 MG) BY MOUTH AT BEDTIME 30 tablet 5   FLUoxetine  (PROZAC) 20 MG tablet Take 40 mg by mouth daily.     fluticasone (FLONASE) 50 MCG/ACT nasal spray Place 1 spray into both nostrils daily as needed for allergies or rhinitis. 16 g 5   ipratropium (ATROVENT) 0.03 % nasal spray Place 2 sprays into both nostrils 2 (two) times daily. 30 mL 5   levocetirizine (XYZAL) 5 MG tablet Take 1 tablet (5 mg total) by mouth 2 (two) times daily as needed (Can take an extra dose during flare ups.). 60 tablet 5   levothyroxine (SYNTHROID)  75 MCG tablet Take 75 mcg by mouth daily before breakfast.     methocarbamol (ROBAXIN) 500 MG tablet Take 1 tablet (500 mg total) by mouth every 8 (eight) hours as needed for muscle spasms. 20 tablet 0   omeprazole (PRILOSEC) 40 MG capsule Take 1 capsule (40 mg total) by mouth every morning. 30 capsule 5   rosuvastatin (CRESTOR) 10 MG tablet Take 10 mg by mouth every morning.     umeclidinium-vilanterol (ANORO ELLIPTA) 62.5-25 MCG/ACT AEPB Inhale 1 puff into the lungs daily. 60 each 3   Vitamin D, Ergocalciferol, (DRISDOL) 50000 UNITS CAPS Take 50,000 Units by mouth every 7 (seven) days. On Mondays     montelukast (SINGULAIR) 10 MG tablet TAKE 1 TABLET(10 MG) BY MOUTH AT BEDTIME 30 tablet 1   diazepam (VALIUM) 5 MG tablet Take 5 mg by mouth every 6 (six) hours as needed for muscle spasms. (Patient not taking: Reported on 06/12/2022)     Olopatadine HCl 0.6 % SOLN Place 1 spray into the nose in the morning and at bedtime. (Patient not taking: Reported on 02/12/2022) 30.5 g 5   methylPREDNISolone (MEDROL DOSEPAK) 4 MG TBPK tablet Take per Dosepak instructions (Patient not taking: Reported on 06/12/2022) 21 tablet 0   No facility-administered medications prior to visit.    Review of Systems  Constitutional:  Negative for chills, diaphoresis, fever, malaise/fatigue and weight loss.  HENT:  Negative for congestion.   Respiratory:  Positive for cough and shortness of breath. Negative for hemoptysis, sputum production and wheezing.    Cardiovascular:  Negative for chest pain, palpitations and leg swelling.     Objective:   Vitals:   06/12/22 1415  BP: 124/82  Pulse: 78  Temp: 97.9 F (36.6 C)  TempSrc: Oral  SpO2: 97%  Weight: 103 lb (46.7 kg)  Height: 4\' 9"  (1.448 m)   SpO2: 97 % O2 Device: None (Room air)  Physical Exam: General: Well-appearing, no acute distress HENT: [place], AT Eyes: EOMI, no scleral icterus Respiratory: Clear to auscultation bilaterally.  No crackles, wheezing or rales Cardiovascular: RRR, -M/R/G, no JVD Extremities:-Edema,-tenderness Neuro: AAO x4, CNII-XII grossly intact Psych: Normal mood, normal affect   Data Reviewed:  Imaging: CT Chest 08/27/17 - Mild paraseptal emphysema. CXR 06/28/21 - No acute infiltrate, effusion or edema CT Chest A/P 07/17/21 - Minimal apical emphysema. Azygous fissure.  PFT: 10/16/17 FVC 2.55 (101%) FEV1 2.10 (110%) Ratio 80  TLC 99% DLCO 84% Interpretation: Normal PFTs  Spirometry 12/26/20 FVC 1.94 (85%) FEV1 (87%) Ratio 79%  Spirometry 02/12/22 FVC 1.65 (73%) FEV1 1.32 (78%) Ratio 80 Interpretation: Normal spirometry  Labs: CBC    Component Value Date/Time   WBC 5.3 09/14/2021 1243   RBC 3.30 (L) 09/14/2021 1243   HGB 9.2 (L) 09/15/2021 0414   HGB 14.8 10/07/2017 1442   HCT 28.4 (L) 09/15/2021 0414   HCT 36.2 05/11/2019 1445   PLT 448 (H) 09/14/2021 1243   PLT 326 10/07/2017 1442   MCV 108.5 (H) 09/14/2021 1243   MCV 102 (H) 10/07/2017 1442   MCH 34.2 (H) 09/14/2021 1243   MCHC 31.6 09/14/2021 1243   RDW 12.7 09/14/2021 1243   RDW 13.5 10/07/2017 1442   LYMPHSABS 1.0 07/17/2021 1815   LYMPHSABS 1.1 10/07/2017 1442   MONOABS 0.6 07/17/2021 1815   EOSABS 0.1 07/17/2021 1815   EOSABS 0.0 10/07/2017 1442   BASOSABS 0.1 07/17/2021 1815   BASOSABS 0.0 10/07/2017 1442        Assessment & Plan:  Discussion: 77 year old female with COPD with emphysema, allergic rhinitis, GERD, hypothyroidism, arthritis, OA, RA and hx chronic  fatigue syndrome/lyme disease s/p treatment who presents for follow-up. Symptoms are well controlled. Appreciates pulmonary, ENT and Allergy involvement.  COPD with paraseptal emphysema --CONTINUE Anoro ONE puff ONCE a day --CONTINUE Albuterol AS NEEDED for shortness of breath or wheezing --Reviewed spirometry from 02/2022. Decreasing FVC since 2019 but no obstruction  Chronic sinus congestion Reflux --CONTINUE singulair 10 mg daily. REFILL --CONTINUE omeprazole 40 mg daily   Health Maintenance Immunization History  Administered Date(s) Administered   Fluad Quad(high Dose 65+) 10/31/2021   Influenza Inj Mdck Quad Pf 12/26/2015   Influenza Split 05/26/2008, 06/05/2009, 11/16/2009, 12/07/2010, 11/20/2011, 12/25/2012, 01/01/2014   Influenza, High Dose Seasonal PF 01/01/2014, 11/17/2014, 12/07/2016, 11/19/2017, 12/05/2018   Influenza-Unspecified 12/07/2016, 11/16/2018   PFIZER(Purple Top)SARS-COV-2 Vaccination 03/19/2019, 04/13/2019, 10/12/2019   Pneumococcal Conjugate-13 07/07/2013   Pneumococcal Polysaccharide-23 05/26/2008, 06/05/2009, 06/29/2012, 10/22/2017   Td 05/26/2008, 06/05/2009   Td (Adult), 2 Lf Tetanus Toxid, Preservative Free 05/26/2008, 06/05/2009   Tdap 05/19/2007, 11/01/2016, 01/03/2022   Zoster Recombinat (Shingrix) 12/26/2016, 06/12/2017   Zoster, Live 05/26/2008, 06/05/2009, 06/08/2010, 11/17/2014, 12/26/2016   CT Lung Screen - not qualified. Quit >15 years ago  No orders of the defined types were placed in this encounter.  Meds ordered this encounter  Medications   montelukast (SINGULAIR) 10 MG tablet    Sig: Take 1 tablet (10 mg total) by mouth at bedtime.    Dispense:  30 tablet    Refill:  11    Return in about 6 months (around 12/13/2022).  I have spent a total time of 35-minutes on the day of the appointment including chart review, data review, collecting history, coordinating care and discussing medical diagnosis and plan with the patient/family. Past  medical history, allergies, medications were reviewed. Pertinent imaging, labs and tests included in this note have been reviewed and interpreted independently by me.  Karlye Ihrig Mechele Collin, MD [facility] 06/12/2022 2:50 PM  Office Number [phone]

## 2022-06-12 NOTE — Patient Instructions (Signed)
COPD with paraseptal emphysema --CONTINUE Anoro ONE puff ONCE a day --CONTINUE Albuterol AS NEEDED for shortness of breath or wheezing --Reviewed spirometry from 02/2022. Decreasing FVC since 2019 but no obstruction  Chronic sinus congestion Reflux --CONTINUE singulair 10 mg daily. REFILL --CONTINUE omeprazole 40 mg daily

## 2022-06-18 DIAGNOSIS — L821 Other seborrheic keratosis: Secondary | ICD-10-CM | POA: Diagnosis not present

## 2022-06-20 DIAGNOSIS — H43813 Vitreous degeneration, bilateral: Secondary | ICD-10-CM | POA: Diagnosis not present

## 2022-06-20 DIAGNOSIS — H04123 Dry eye syndrome of bilateral lacrimal glands: Secondary | ICD-10-CM | POA: Diagnosis not present

## 2022-06-20 DIAGNOSIS — H26493 Other secondary cataract, bilateral: Secondary | ICD-10-CM | POA: Diagnosis not present

## 2022-06-20 DIAGNOSIS — H524 Presbyopia: Secondary | ICD-10-CM | POA: Diagnosis not present

## 2022-06-20 DIAGNOSIS — Z961 Presence of intraocular lens: Secondary | ICD-10-CM | POA: Diagnosis not present

## 2022-07-27 IMAGING — DX DG CHEST 2V
2 series · 2 of 2 positions shown · non-contrast
Comparison: 06/22/2019

CLINICAL DATA: Cough, shortness of breath, COPD

EXAM:
CHEST - 2 VIEW

[chest pa]
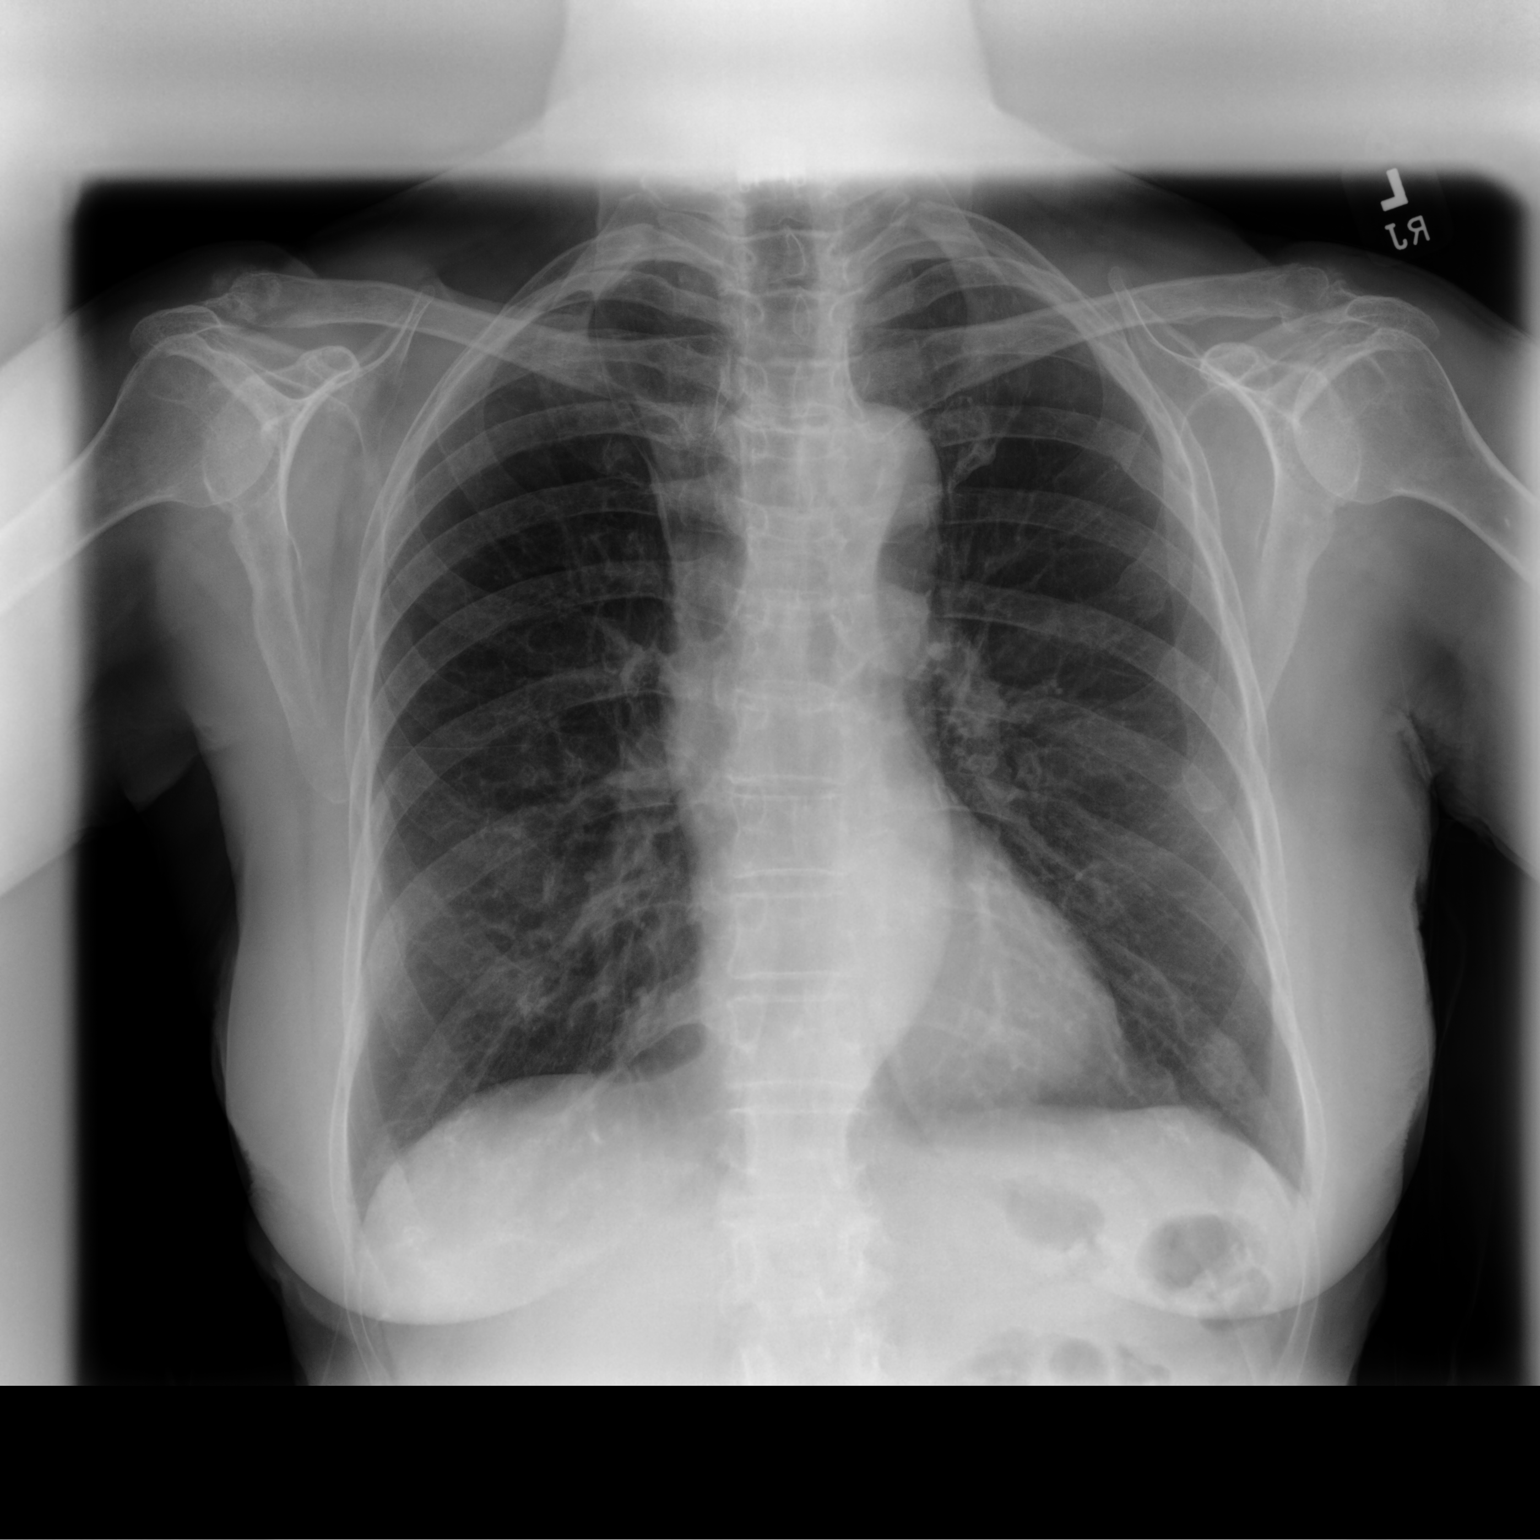

[chest lat]
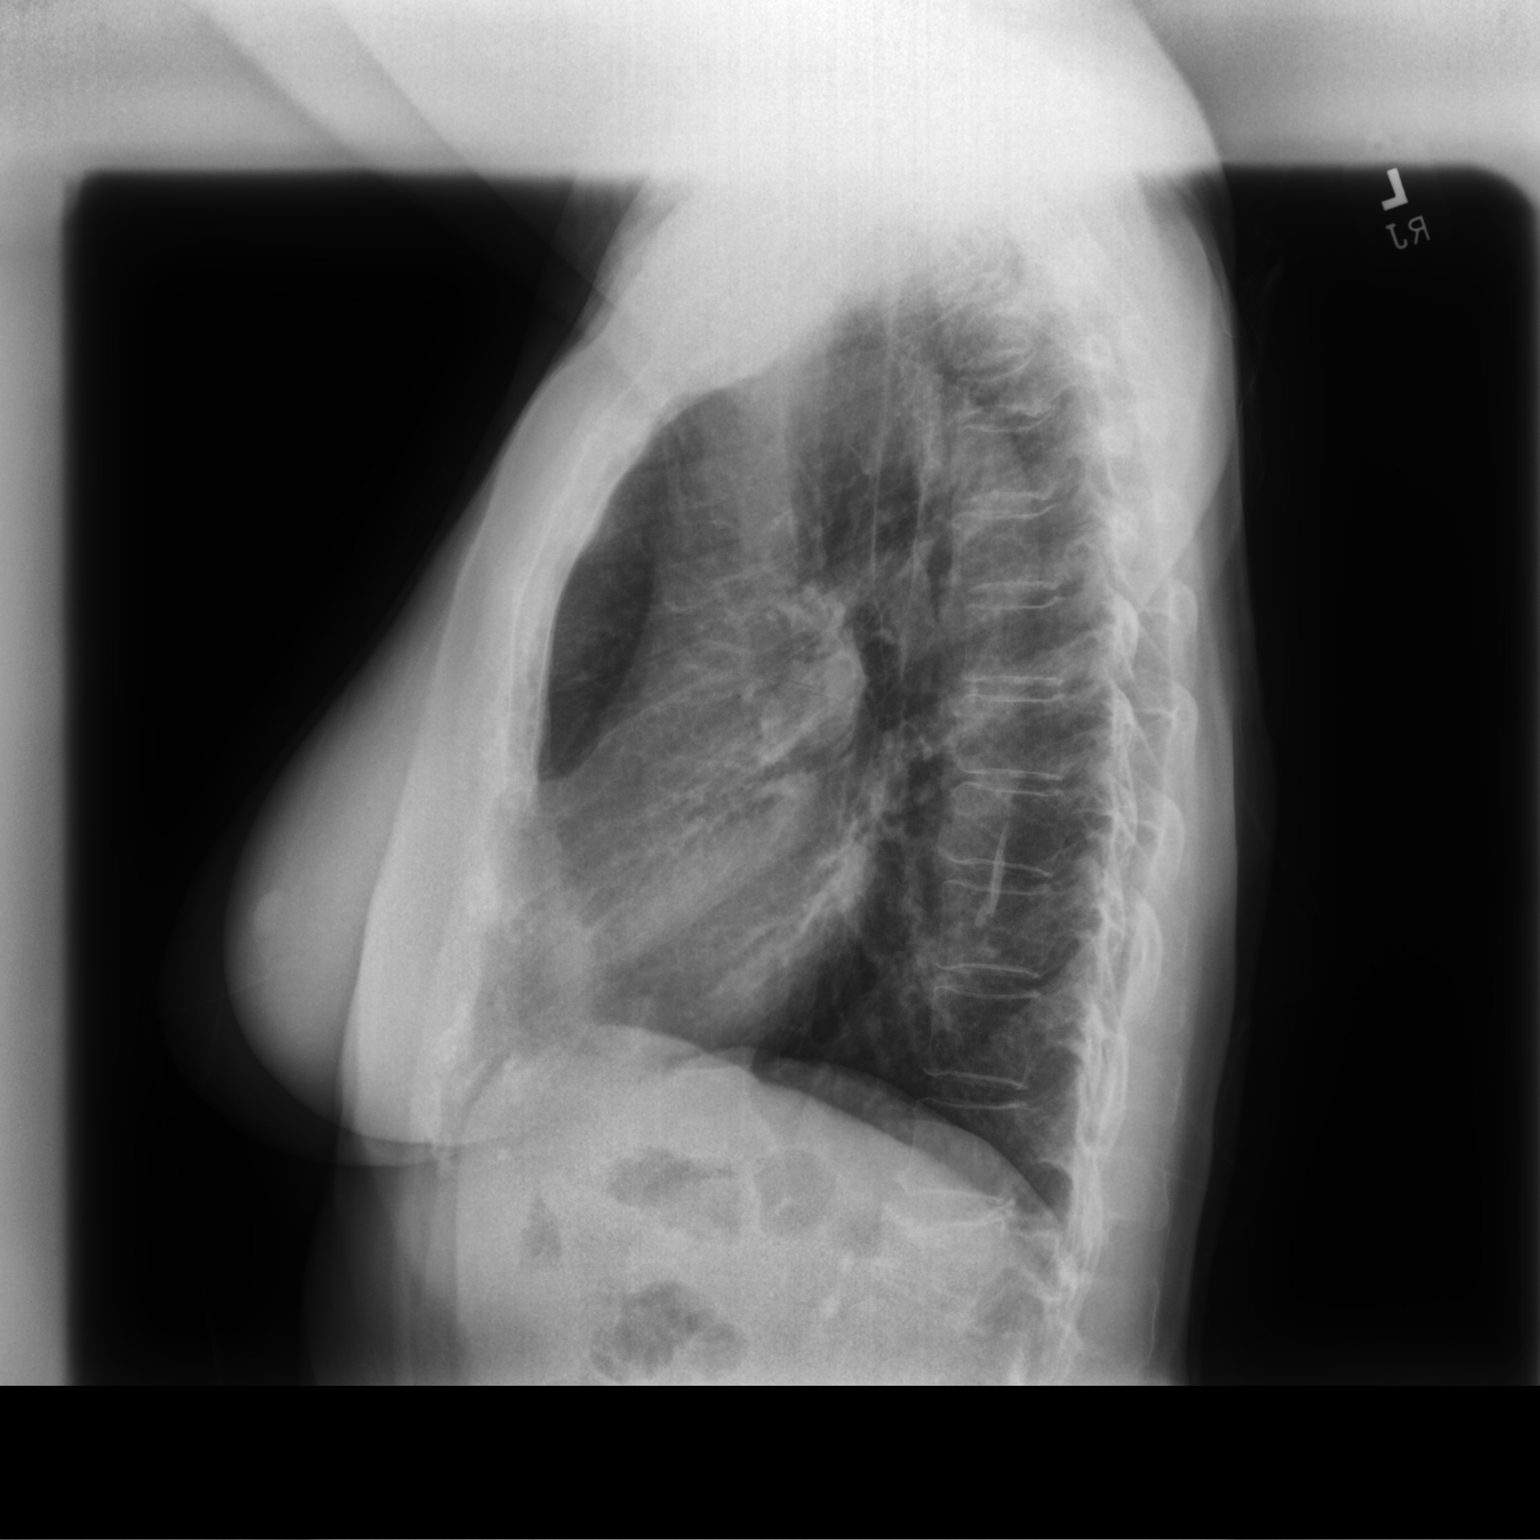

[2 of 2 positions shown; findings below may reference images not displayed]

FINDINGS: Cardiac and mediastinal contours are within normal limits. Aortic
tortuosity. Azygous fissure. Unchanged mild hyperinflation. No focal
pulmonary opacity. No pleural effusion or pneumothorax. No acute
osseous abnormality. Redemonstrated L1 compression fracture.
IMPRESSION: No acute cardiopulmonary process.

## 2022-08-02 ENCOUNTER — Telehealth: Payer: Self-pay | Admitting: Pulmonary Disease

## 2022-08-02 DIAGNOSIS — R0981 Nasal congestion: Secondary | ICD-10-CM | POA: Diagnosis not present

## 2022-08-02 DIAGNOSIS — R0609 Other forms of dyspnea: Secondary | ICD-10-CM | POA: Diagnosis not present

## 2022-08-02 DIAGNOSIS — Z1152 Encounter for screening for COVID-19: Secondary | ICD-10-CM | POA: Diagnosis not present

## 2022-08-02 DIAGNOSIS — B349 Viral infection, unspecified: Secondary | ICD-10-CM | POA: Diagnosis not present

## 2022-08-02 DIAGNOSIS — R509 Fever, unspecified: Secondary | ICD-10-CM | POA: Diagnosis not present

## 2022-08-02 DIAGNOSIS — J449 Chronic obstructive pulmonary disease, unspecified: Secondary | ICD-10-CM | POA: Diagnosis not present

## 2022-08-02 DIAGNOSIS — M0600 Rheumatoid arthritis without rheumatoid factor, unspecified site: Secondary | ICD-10-CM | POA: Diagnosis not present

## 2022-08-02 DIAGNOSIS — J029 Acute pharyngitis, unspecified: Secondary | ICD-10-CM | POA: Diagnosis not present

## 2022-08-02 NOTE — Telephone Encounter (Signed)
JE, please advise.  

## 2022-08-02 NOTE — Telephone Encounter (Signed)
Patient states having symptoms of shortness of breath and cough. Pharmacy is [facility]. Patient phone number is [phone].

## 2022-08-02 NOTE — Telephone Encounter (Signed)
Called patient x 3. Left message to contact me via mychart with more details on her symptoms and see if I can help her before the weekend.  Please contact patient on Monday to schedule follow-up appointment with me in-person or mychart if needed.

## 2022-08-05 NOTE — Telephone Encounter (Signed)
Got sick Thursday/ Friday from moving out of her house due to water damage. Got into PCP office Friday to be treated. Feel much better today and is headed to the beach. Nothing further needed.

## 2022-08-18 DIAGNOSIS — R300 Dysuria: Secondary | ICD-10-CM | POA: Diagnosis not present

## 2022-08-18 DIAGNOSIS — Z681 Body mass index (BMI) 19 or less, adult: Secondary | ICD-10-CM | POA: Diagnosis not present

## 2022-08-20 ENCOUNTER — Encounter: Payer: Self-pay | Admitting: Allergy and Immunology

## 2022-08-20 ENCOUNTER — Ambulatory Visit (INDEPENDENT_AMBULATORY_CARE_PROVIDER_SITE_OTHER): Payer: Medicare Other | Admitting: Allergy and Immunology

## 2022-08-20 VITALS — BP 122/70 | HR 78 | Temp 98.8°F | Resp 14 | Ht 59.06 in | Wt 99.3 lb

## 2022-08-20 DIAGNOSIS — K219 Gastro-esophageal reflux disease without esophagitis: Secondary | ICD-10-CM | POA: Diagnosis not present

## 2022-08-20 DIAGNOSIS — G500 Trigeminal neuralgia: Secondary | ICD-10-CM | POA: Diagnosis not present

## 2022-08-20 DIAGNOSIS — J3089 Other allergic rhinitis: Secondary | ICD-10-CM

## 2022-08-20 DIAGNOSIS — B3781 Candidal esophagitis: Secondary | ICD-10-CM | POA: Diagnosis not present

## 2022-08-20 DIAGNOSIS — J4489 Other specified chronic obstructive pulmonary disease: Secondary | ICD-10-CM

## 2022-08-20 DIAGNOSIS — D510 Vitamin B12 deficiency anemia due to intrinsic factor deficiency: Secondary | ICD-10-CM | POA: Diagnosis not present

## 2022-08-20 DIAGNOSIS — N3010 Interstitial cystitis (chronic) without hematuria: Secondary | ICD-10-CM | POA: Diagnosis not present

## 2022-08-20 NOTE — Patient Instructions (Addendum)
  1.  Continue to Treat and prevent inflammation:   A.  Flonase 1 spray each nostril 2 times per day  B.  montelukast 10 mg tablet 1 time per day  C.  Anoro - 1 inhalation 1 time per day  2.  Continue to Treat and prevent reflux:   A.  Omeprazole 40 mg tablet in a.m.  B.  Famotidine 40 mg mg tablet in p.m.  3.  If needed:   A.  OTC antihistamine. Mucinex, Sudafed  B.  Nasal saline  C.  Albuterol HFA - 2 inhalations every 4-6 hours  D.  Patanase - 1 spray each nostril 2 times per day  E.  Ipratropium 0.03% -1-2 sprays each nostril 1-2 times a day   4. Return to clinic in 6 months or earlier if problem.    5. Plan for fall flu vaccine

## 2022-08-20 NOTE — Progress Notes (Unsigned)
[place] - [place] - [place] - [place]   Follow-up Note  Referring Provider: Cleatis Polka., MD Primary Provider: Cleatis Polka., MD Date of Office Visit: 08/20/2022  Subjective:   Pamela Larson (DOB: 1945/03/26) is a 77 y.o. female who [facility] in re-evaluation of the following:  HPI: Latona returns to this clinic in evaluation of asthma/emphysema/COPD overlap, chronic rhinitis, LPR, and left-sided facial pain syndrome.  I last saw her in this clinic 12 February 2022.  She continues to do very well with her lower airway issue and rarely uses a short acting bronchodilator and can exercise without any problem and has not required a systemic steroid to treat exacerbation of her lower airway disease.   She had very little problems with her upper airways although she did develop an episode of "sinusitis" for which her ear nose and throat doctor gave her azithromycin in March 2024 complicated by yeast infection in her mouth for which she took Diflucan.  She believes that her reflux is under very good control at this point.  She is not having any left-sided facial pain.  She recently developed an urinary tract infection for which she has been given nitrofurantoin and unfortunately she developed thrush again and she has been given some more Diflucan.   Allergies as of 08/20/2022       Reactions   Amoxicillin Nausea And Vomiting, Rash   Has patient had a PCN reaction causing immediate rash, facial/tongue/throat swelling, SOB or lightheadedness with hypotension: #  #  #  YES  #  #  #  Has patient had a PCN reaction causing severe rash involving mucus membranes or skin necrosis: No Has patient had a PCN reaction that required hospitalization No Has patient had a PCN reaction occurring within the last 10 years: #  #  #  YES  #  #  #  If all of the above answers are "NO", then may proceed with Cephalosporin use.    Augmentin [amoxicillin-pot Clavulanate] Nausea And Vomiting   Meperidine Hcl Nausea Only   Penicillins Rash   Sulfa Antibiotics Nausea And Vomiting, Rash   Sulfamethoxazole-trimethoprim Swelling   "jittery"        Medication List    albuterol 108 (90 Base) MCG/ACT inhaler Commonly known as: VENTOLIN HFA Inhale 2 puffs into the lungs every 4 (four) hours as needed for wheezing or shortness of breath. For shortness of breath   ALPRAZolam 0.25 MG tablet Commonly known as: XANAX Take 0.125 mg by mouth daily as needed for anxiety.   Anoro Ellipta 62.5-25 MCG/ACT Aepb Generic drug: umeclidinium-vilanterol Inhale 1 puff into the lungs daily.   calcium carbonate 1250 (500 Ca) MG tablet Commonly known as: OS-CAL - dosed in mg of elemental calcium Take 1 tablet by mouth daily.   dextromethorphan-guaiFENesin 30-600 MG 12hr tablet Commonly known as: MUCINEX DM Take 1 tablet by mouth 2 (two) times daily.   famotidine 40 MG tablet Commonly known as: PEPCID TAKE 1 TABLET(40 MG) BY MOUTH AT BEDTIME   FLUoxetine 20 MG tablet Commonly known as: PROZAC Take 40 mg by mouth daily.   fluticasone 50 MCG/ACT nasal spray Commonly known as: FLONASE Place 1 spray into both nostrils daily as needed for allergies or rhinitis.   Forteo 600 MCG/2.4ML Sopn Generic drug: Teriparatide (Recombinant) Inject 20 mcg into the skin once.   ipratropium 0.03 % nasal spray Commonly known as: ATROVENT Place  2 sprays into both nostrils 2 (two) times daily.   levocetirizine 5 MG tablet Commonly known as: XYZAL Take 1 tablet (5 mg total) by mouth 2 (two) times daily as needed (Can take an extra dose during flare ups.).   levothyroxine 75 MCG tablet Commonly known as: SYNTHROID Take 75 mcg by mouth daily before breakfast.   montelukast 10 MG tablet Commonly known as: SINGULAIR Take 1 tablet (10 mg total) by mouth at bedtime.   nystatin 100000 UNIT/ML suspension Commonly known as: MYCOSTATIN Use as  directed 5 mLs in the mouth or throat 4 (four) times daily.   Olopatadine HCl 0.6 % Soln Place 1 spray into the nose in the morning and at bedtime.   omeprazole 40 MG capsule Commonly known as: PRILOSEC Take 1 capsule (40 mg total) by mouth every morning.   rosuvastatin 10 MG tablet Commonly known as: CRESTOR Take 10 mg by mouth every morning.   VITAMIN B-12 IJ Inject as directed every 30 (thirty) days.   Vitamin D (Ergocalciferol) 1.25 MG (50000 UNIT) Caps capsule Commonly known as: DRISDOL Take 50,000 Units by mouth every 7 (seven) days. 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    Osteoporosis    Pneumonia    hx of    Rheumatoid arthritis (HCC)     Past Surgical History:  Procedure Laterality Date   APPENDECTOMY  1978   bladder hydrodistention  1998   BREAST SURGERY     reduction    CERVICAL DISC ARTHROPLASTY N/A 04/03/2016   Procedure: CERVICAL SIX CERVICAL SEVEN DISC ARTHROPLASTY;  Surgeon: Shirlean Kelly, MD;  Location: [facility];  Service: Neurosurgery;  Laterality: N/A;  left side approach   COSMETIC SURGERY  2005   face lift   EXCISION MORTON'S NEUROMA Left 05/08/2017   Procedure: EXCISION MORTON'S NEUROMA Left 3rd webspace;  Surgeon: Toni Arthurs, MD;  Location: [facility];  Service: Orthopedics;  Laterality: Left;   EYE SURGERY     lower eye lids   LUMBAR LAMINECTOMY/DECOMPRESSION  MICRODISCECTOMY  06/10/2011   Procedure: LUMBAR LAMINECTOMY/DECOMPRESSION MICRODISCECTOMY 1 LEVEL;  Surgeon: Hewitt Shorts, MD;  Location: [facility];  Service: Neurosurgery;  Laterality: Left;  Lumbar Five Sacral One Lumbar Laminectomy Decompression Microdiscectomy    REVERSE SHOULDER ARTHROPLASTY Right 09/14/2021   Procedure: REVERSE SHOULDER ARTHROPLASTY;  Surgeon: Beverely Low, MD;  Location: [facility] ORS;  Service: Orthopedics;  Laterality: Right;    Review of systems negative except as noted in HPI / PMHx or noted below:  Review of Systems  Constitutional: Negative.   HENT: Negative.    Eyes: Negative.   Respiratory: Negative.    Cardiovascular: Negative.   Gastrointestinal: Negative.   Genitourinary: Negative.   Musculoskeletal: Negative.   Skin:  Negative.   Neurological: Negative.   Endo/Heme/Allergies: Negative.   Psychiatric/Behavioral: Negative.       Objective:   Vitals:   08/20/22 1408  BP: 122/70  Pulse: 78  Resp: 14  Temp: 98.8 F (37.1 C)  SpO2: 96%   Height: 4' 11.06" (150 cm)  Weight: 99 lb 4.8 oz (45 kg)   Physical Exam Constitutional:      Appearance: She is not diaphoretic.  HENT:     Head: Normocephalic.     Right Ear: Tympanic membrane, ear canal and external ear normal.     Left Ear: Tympanic membrane, ear canal and external ear normal.     Nose: Nose normal. No mucosal edema or rhinorrhea.     Mouth/Throat:     Pharynx: Uvula midline. No oropharyngeal exudate.  Eyes:     Conjunctiva/sclera: Conjunctivae normal.  Neck:     Thyroid: No thyromegaly.     Trachea: Trachea normal. No tracheal tenderness or tracheal deviation.  Cardiovascular:     Rate and Rhythm: Normal rate and regular rhythm.     Heart sounds: Normal heart sounds, S1 normal and S2 normal. No murmur heard. Pulmonary:     Effort: No respiratory distress.     Breath sounds: Normal breath sounds. No stridor. No wheezing or rales.  Lymphadenopathy:     Head:     Right side of  head: No tonsillar adenopathy.     Left side of head: No tonsillar adenopathy.     Cervical: No cervical adenopathy.  Skin:    Findings: No erythema or rash.     Nails: There is no clubbing.  Neurological:     Mental Status: She is alert.     Diagnostics: Spirometry was performed and demonstrated an FEV1 of 1.76 at 101 % of predicted.  Assessment and Plan:   1. COPD with asthma   2. Perennial allergic rhinitis   3. LPRD (laryngopharyngeal reflux disease)   4. Facial pain syndrome     1.  Continue to Treat and prevent inflammation:   A.  Flonase 1 spray each nostril 2 times per day  B.  montelukast 10 mg tablet 1 time per day  C.  Anoro - 1 inhalation 1 time per day  2.  Continue to Treat and prevent reflux:   A.  Omeprazole 40 mg tablet in a.m.  B.  Famotidine 40 mg mg tablet in p.m.  3.  If needed:   A.  OTC antihistamine. Mucinex, Sudafed  B.  Nasal saline  C.  Albuterol HFA - 2 inhalations every 4-6 hours  D.  Patanase - 1 spray each nostril 2 times per day  E.  Ipratropium 0.03% -1-2 sprays each nostril 1-2 times a day   4. Return to clinic in 6 months or earlier if problem.    5. Plan for fall flu vaccine  Raneisha appears to be doing very well on her current plan and we are not really going to change much of her plan.  It is interesting to note that her FEV1 is approximately 100% of predicted which would argue against any form of COPD but she is doing well with the therapy prescribed by her pulmonologist and we are not going to change any of that treatment at this point.  She will continue on therapy for upper airway inflammation as noted above and continue on therapy directed against reflux and if she continues to do this well I will see her back in this  clinic in 6 months or earlier if there is a problem.  Laurette Schimke, MD Allergy / Immunology [facility]

## 2022-08-21 ENCOUNTER — Encounter: Payer: Self-pay | Admitting: Allergy and Immunology

## 2022-09-05 DIAGNOSIS — N390 Urinary tract infection, site not specified: Secondary | ICD-10-CM | POA: Diagnosis not present

## 2022-09-12 ENCOUNTER — Other Ambulatory Visit: Payer: Self-pay | Admitting: Allergy and Immunology

## 2022-09-30 ENCOUNTER — Encounter (HOSPITAL_BASED_OUTPATIENT_CLINIC_OR_DEPARTMENT_OTHER): Payer: Self-pay | Admitting: Emergency Medicine

## 2022-09-30 ENCOUNTER — Emergency Department (HOSPITAL_BASED_OUTPATIENT_CLINIC_OR_DEPARTMENT_OTHER)
Admission: EM | Admit: 2022-09-30 | Discharge: 2022-09-30 | Disposition: A | Discharge status: Home / Self Care | Payer: Medicare Other | Source: Home / Self Care | Attending: Emergency Medicine | Admitting: Emergency Medicine

## 2022-09-30 DIAGNOSIS — X501XXA Overexertion from prolonged static or awkward postures, initial encounter: Secondary | ICD-10-CM | POA: Diagnosis not present

## 2022-09-30 DIAGNOSIS — S39012A Strain of muscle, fascia and tendon of lower back, initial encounter: Secondary | ICD-10-CM | POA: Diagnosis not present

## 2022-09-30 DIAGNOSIS — S3992XA Unspecified injury of lower back, initial encounter: Secondary | ICD-10-CM | POA: Diagnosis present

## 2022-09-30 DIAGNOSIS — N3010 Interstitial cystitis (chronic) without hematuria: Secondary | ICD-10-CM | POA: Diagnosis not present

## 2022-09-30 LAB — BASIC METABOLIC PANEL
Anion gap: 11 (ref 5–15)
BUN: 20 mg/dL (ref 8–23)
CO2: 20 mmol/L — ABNORMAL LOW (ref 22–32)
Calcium: 8.9 mg/dL (ref 8.9–10.3)
Chloride: 106 mmol/L (ref 98–111)
Creatinine, Ser: 0.74 mg/dL (ref 0.44–1.00)
GFR, Estimated: >60 mL/min (ref >60)
Glucose, Bld: 79 mg/dL (ref 70–99)
Potassium: 3.7 mmol/L (ref 3.5–5.1)
Sodium: 137 mmol/L (ref 135–145)

## 2022-09-30 LAB — CBC WITH DIFFERENTIAL/PLATELET
Abs Immature Granulocytes: 0.01 10*3/uL (ref 0.00–0.07)
Basophils Absolute: 0 10*3/uL (ref 0.0–0.1)
Basophils Relative: 0 %
Eosinophils Absolute: 0.1 10*3/uL (ref 0.0–0.5)
Eosinophils Relative: 1 %
HCT: 32.7 % — ABNORMAL LOW (ref 36.0–46.0)
Hemoglobin: 10.9 g/dL — ABNORMAL LOW (ref 12.0–15.0)
Immature Granulocytes: 0 %
Lymphocytes Relative: 25 %
Lymphs Abs: 1.2 10*3/uL (ref 0.7–4.0)
MCH: 34.2 pg — ABNORMAL HIGH (ref 26.0–34.0)
MCHC: 33.3 g/dL (ref 30.0–36.0)
MCV: 102.5 fL — ABNORMAL HIGH (ref 80.0–100.0)
Monocytes Absolute: 0.5 10*3/uL (ref 0.1–1.0)
Monocytes Relative: 9 %
Neutro Abs: 3.1 10*3/uL (ref 1.7–7.7)
Neutrophils Relative %: 65 %
Platelets: 234 10*3/uL (ref 150–400)
RBC: 3.19 MIL/uL — ABNORMAL LOW (ref 3.87–5.11)
RDW: 13.9 % (ref 11.5–15.5)
WBC: 4.9 10*3/uL (ref 4.0–10.5)
nRBC: 0 % (ref 0.0–0.2)

## 2022-09-30 MED ORDER — KETOROLAC TROMETHAMINE 60 MG/2ML IM SOLN
30.0000 mg | Freq: Once | INTRAMUSCULAR | Status: AC
  Administered 2022-09-30: 30 mg via INTRAMUSCULAR
  Filled 2022-09-30: qty 2

## 2022-09-30 MED ORDER — CYCLOBENZAPRINE HCL 5 MG PO TABS
5.0000 mg | ORAL_TABLET | Freq: Once | ORAL | Status: AC
  Administered 2022-09-30: 5 mg via ORAL
  Filled 2022-09-30: qty 1

## 2022-09-30 MED ORDER — HYDROCODONE-ACETAMINOPHEN 5-325 MG PO TABS
1.0000 | ORAL_TABLET | Freq: Once | ORAL | Status: AC
  Administered 2022-09-30: 1 via ORAL
  Filled 2022-09-30: qty 1

## 2022-09-30 MED ORDER — LIDOCAINE 5 % EX PTCH
1.0000 | MEDICATED_PATCH | CUTANEOUS | Status: DC
  Administered 2022-09-30: 1 via TRANSDERMAL
  Filled 2022-09-30: qty 1

## 2022-09-30 MED ORDER — CYCLOBENZAPRINE HCL 5 MG PO TABS: 5.0000 mg | ORAL_TABLET | Freq: Three times a day (TID) | ORAL | 0 refills | Status: AC | PRN

## 2022-09-30 NOTE — ED Triage Notes (Addendum)
Lower back/ left flank pain Started this AM getting worse pain down left leg Denies loss of bowel or bladder  Seen for similar in past "pinched nerve"  Urine done today by urology services, clean

## 2022-09-30 NOTE — ED Notes (Signed)
Pt d/c home per MD order. Discharge summary reviewed, pt verbalizes understanding. Reports daughter is discharge ride home. No s/s of acute distress noted at discharge.

## 2022-09-30 NOTE — ED Provider Notes (Signed)
[place] EMERGENCY DEPARTMENT AT [facility] Provider Note   CSN: [id number] Arrival date & time: 09/30/22  1830     History  Chief Complaint  Patient presents with   Back Pain    Pamela Larson is a 77 y.o. female presenting with acute left-sided sciatic pain.  She says she was in her yard yesterday and tried to lift something and felt like she tweaked her back.  This morning she woke up with significant sharp pain on the left side, which radiates down her left leg to her knee. It worsens with flexion of her knee and twisting of her back. She notes it also worsens with prolonged sitting.  She also reports some tingling radiating down her leg and a burning sensation along her lateral left hip. She took a muscle relaxer this morning which has not helped. She denies any recent fever, chills, changes in urinary or bowel function. She says she has had pain like this several times before and came to the ED for it. Per chart review, her most recent visit was in February.   Home Medications Prior to Admission medications   Medication Sig Start Date End Date Taking? Authorizing Provider  cyclobenzaprine (FLEXERIL) 5 MG tablet Take 1 tablet (5 mg total) by mouth 3 (three) times daily as needed for muscle spasms. 09/30/22  Yes Ernie Avena, MD  albuterol (VENTOLIN HFA) 108 (90 Base) MCG/ACT inhaler Inhale 2 puffs into the lungs every 4 (four) hours as needed for wheezing or shortness of breath. For shortness of breath 02/12/22   Kozlow, Alvira Philips, MD  ALPRAZolam Prudy Feeler) 0.25 MG tablet Take 0.125 mg by mouth daily as needed for anxiety.    [provider]  Ernestina Patches 62.5-25 MCG/ACT AEPB INHALE 1 PUFF INTO THE LUNGS DAILY 09/13/22   Kozlow, Alvira Philips, MD  calcium carbonate (OS-CAL - DOSED IN MG OF ELEMENTAL CALCIUM) 1250 MG tablet Take 1 tablet by mouth daily.    [provider]  Cyanocobalamin (VITAMIN B-12 IJ) Inject as directed every 30 (thirty) days.    [provider]  dextromethorphan-guaiFENesin (MUCINEX DM) 30-600 MG 12hr tablet Take 1 tablet by mouth 2 (two) times daily.    [provider]  famotidine (PEPCID) 40 MG tablet TAKE 1 TABLET(40 MG) BY MOUTH AT BEDTIME 02/12/22   Kozlow, Alvira Philips, MD  FLUoxetine (PROZAC) 20 MG tablet Take 40 mg by mouth daily.    [provider]  fluticasone (FLONASE) 50 MCG/ACT nasal spray Place 1 spray into both nostrils daily as needed for allergies or rhinitis. 02/12/22   Kozlow, Alvira Philips, MD  ipratropium (ATROVENT) 0.03 % nasal spray Place 2 sprays into both nostrils 2 (two) times daily. 02/12/22   Kozlow, Alvira Philips, MD  levocetirizine (XYZAL) 5 MG tablet Take 1 tablet (5 mg total) by mouth 2 (two) times daily as needed (Can take an extra dose during flare ups.). 02/12/22   Kozlow, Alvira Philips, MD  levothyroxine (SYNTHROID) 75 MCG tablet Take 75 mcg by mouth daily before breakfast.    [provider]  montelukast (SINGULAIR) 10 MG tablet Take 1 tablet (10 mg total) by mouth at bedtime. 06/12/22   Luciano Cutter, MD  nystatin (MYCOSTATIN) 100000 UNIT/ML suspension Use as directed 5 mLs in the mouth or throat 4 (four) times daily. 05/08/22   [provider]  Olopatadine HCl 0.6 % SOLN Place 1 spray into the nose in the morning and at bedtime. 06/26/21   Laurette Schimke  J, MD  omeprazole (PRILOSEC) 40 MG capsule Take 1 capsule (40 mg total) by mouth every morning. 02/12/22   Kozlow, Alvira Philips, MD  rosuvastatin (CRESTOR) 10 MG tablet Take 10 mg by mouth every morning. 08/02/18   [provider]  Teriparatide, Recombinant, (FORTEO) 600 MCG/2.4ML SOPN Inject 20 mcg into the skin once. 04/09/11   [provider]  Vitamin D, Ergocalciferol, (DRISDOL) 50000 UNITS CAPS Take 50,000 Units by mouth every 7 (seven) days. On Mondays    [provider]      Allergies    Amoxicillin, Augmentin [amoxicillin-pot clavulanate], Meperidine hcl, Penicillins, Sulfa antibiotics, and  Sulfamethoxazole-trimethoprim    Review of Systems   Review of Systems  Constitutional: Negative.   HENT: Negative.    Eyes: Negative.   Respiratory: Negative.    Cardiovascular: Negative.   Gastrointestinal: Negative.   Endocrine: Negative.   Genitourinary: Negative.   Musculoskeletal:  Positive for back pain.  Skin: Negative.   Allergic/Immunologic: Negative.   Neurological: Negative.   Hematological: Negative.   Psychiatric/Behavioral: Negative.      Physical Exam Updated Vital Signs BP (!) 160/75   Pulse 69   Temp (!) 97.4 F (36.3 C)   Resp 18   SpO2 97%  Physical Exam Constitutional:      Appearance: Normal appearance.  HENT:     Head: Normocephalic and atraumatic.     Nose: Nose normal.     Mouth/Throat:     Mouth: Mucous membranes are moist.  Eyes:     Extraocular Movements: Extraocular movements intact.     Pupils: Pupils are equal, round, and reactive to light.  Cardiovascular:     Rate and Rhythm: Normal rate and regular rhythm.     Pulses: Normal pulses.     Heart sounds: Normal heart sounds.  Pulmonary:     Effort: Pulmonary effort is normal.     Breath sounds: Normal breath sounds.  Abdominal:     General: Abdomen is flat. Bowel sounds are normal.     Palpations: Abdomen is soft.  Musculoskeletal:        General: Tenderness present. Normal range of motion.     Cervical back: Normal range of motion and neck supple.     Comments: Tenderness over the left SI joint, normal ROM. Pain worsened with leg raise, flexion of the knee. Burning sensation along lateral left hip.   Neurological:     Mental Status: She is alert.     ED Results / Procedures / Treatments   Labs (all labs ordered are listed, but only abnormal results are displayed) Labs Reviewed  CBC WITH DIFFERENTIAL/PLATELET - Abnormal; Notable for the following components:      Result Value   RBC 3.19 (*)    Hemoglobin 10.9 (*)    HCT 32.7 (*)    MCV 102.5 (*)    MCH 34.2 (*)    All  other components within normal limits  BASIC METABOLIC PANEL - Abnormal; Notable for the following components:   CO2 20 (*)    All other components within normal limits    EKG None  Radiology No results found.  Procedures Procedures   Medications Ordered in ED Medications  lidocaine (LIDODERM) 5 % 1 patch (1 patch Transdermal Patch Applied 09/30/22 2125)  HYDROcodone-acetaminophen (NORCO/VICODIN) 5-325 MG per tablet 1 tablet (1 tablet Oral Given 09/30/22 2122)  ketorolac (TORADOL) injection 30 mg (30 mg Intramuscular Given 09/30/22 2123)  cyclobenzaprine (FLEXERIL) tablet 5 mg (5 mg Oral  Given 09/30/22 2122)    ED Course/ Medical Decision Making/ A&P                                 Medical Decision Making Amount and/or Complexity of Data Reviewed Labs: ordered.  Risk Prescription drug management.   This patient presents to the ED for concern of left lower back/sciatic pain, this involves an extensive number of treatment options, and is a complaint that carries with it a high risk of complications and morbidity. Differential includes musculoskeletal inflammation with sciatica, SI joint inflammation, muscle strain.   Co morbidities:  anxiety, fibromyalgia, interstitial cystitis, osteoporosis  Social Determinants of Health:  smoking history  Additional history:  External records from outside source obtained and reviewed including prior emergency department visit for right-sided sciatica on 04/03/2022  Lab Tests:  BMP unremarkable  CBC shows macrocytic anemia, stable  Imaging Studies:  No new imaging.   Medicines ordered and prescription drug management:  I ordered medication including  Medications  lidocaine (LIDODERM) 5 % 1 patch (1 patch Transdermal Patch Applied 09/30/22 2125)  HYDROcodone-acetaminophen (NORCO/VICODIN) 5-325 MG per tablet 1 tablet (1 tablet Oral Given 09/30/22 2122)  ketorolac (TORADOL) injection 30 mg (30 mg Intramuscular Given 09/30/22 2123)   cyclobenzaprine (FLEXERIL) tablet 5 mg (5 mg Oral Given 09/30/22 2122)   for acute sciatic back pain  Reevaluation of the patient after these medicines showed that the patient improved I have reviewed the patients home medicines and have made adjustments as needed  Problem List / ED Course:     ICD-10-CM   1. Strain of lumbar region, initial encounter  S39.012A       MDM:   The patient's pain begins in the lumbosacral region and unilaterally radiates down her left leg, consistent with sciatic pain, most likely in the setting of a recent muscle strain. Her pain is reproducible with flexion of her leg. She also has tingling/burning along the lateral aspect of her left leg. She does not have any neurological symptoms and denies fever, night sweats, weight loss. She was previously seen for similar pain in February and was treated with prednisone and robaxin. In the ED today, she was given norco, flexeril, toradol, and a lidocaine patch, which improved her pain.   Dispostion:  After consideration of the diagnostic results and the patients response to treatment, I feel that the patient is safe to return home and follow up outpatient with her PCP.   Final Clinical Impression(s) / ED Diagnoses Final diagnoses:  Strain of lumbar region, initial encounter    Rx / DC Orders ED Discharge Orders          Ordered    cyclobenzaprine (FLEXERIL) 5 MG tablet  3 times daily PRN        09/30/22 2220              Annett Fabian, MD 09/30/22 2221    Ernie Avena, MD 10/01/22 [phone]

## 2022-09-30 NOTE — Discharge Instructions (Addendum)
Your symptoms are consistent with likely musculoskeletal strain.  You had no concerning findings to suggest ongoing spinal cord compression.  Return to the emerged department if you develop worsening back pain with associated fever, back pain associated with a recent fall, new numbness or weakness in your lower extremities, urinary or fecal incontinence, numbness around your groin in the area where you would sit on a saddle.  Take Tylenol and ibuprofen for pain control, Flexeril for muscle spasm.

## 2022-10-07 ENCOUNTER — Telehealth: Payer: Self-pay

## 2022-10-07 NOTE — Telephone Encounter (Signed)
Transition Care Management Follow-up Telephone Call Date of discharge and from where: 09/30/2022 [facility] How have you been since you were released from the hospital? Patient stated she is feeling much better. Any questions or concerns? No  Items Reviewed: Did the pt receive and understand the discharge instructions provided? Yes  Medications obtained and verified? Yes  Other? No  Any new allergies since your discharge? No  Dietary orders reviewed? Yes Do you have support at home? Yes   Follow up appointments reviewed:  PCP Hospital f/u appt confirmed? No  Scheduled to see  on  @ . Specialist Hospital f/u appt confirmed? No  Scheduled to see  on  @ . Are transportation arrangements needed? No  If their condition worsens, is the pt aware to call PCP or go to the Emergency Dept.? Yes Was the patient provided with contact information for the PCP's office or ED? Yes Was to pt encouraged to call back with questions or concerns? Yes  Tosca Pletz [facility]  [facility] Population Health Community Resource Care Guide   [email or web address]  ?? [phone]   Website: [email or web address]  [email or web address]

## 2022-10-15 DIAGNOSIS — J343 Hypertrophy of nasal turbinates: Secondary | ICD-10-CM | POA: Diagnosis not present

## 2022-10-15 DIAGNOSIS — J310 Chronic rhinitis: Secondary | ICD-10-CM | POA: Diagnosis not present

## 2022-10-21 ENCOUNTER — Telehealth: Payer: Self-pay | Admitting: Emergency Medicine

## 2022-10-21 NOTE — Telephone Encounter (Signed)
Called patient to get her an appointment with Dr Royann Shivers. Appt made for 10/31/22 at 1120.   She had to hurry off the phone to take her dog out, but will call back to get details about appt (address)

## 2022-10-23 DIAGNOSIS — S41111A Laceration without foreign body of right upper arm, initial encounter: Secondary | ICD-10-CM | POA: Diagnosis not present

## 2022-10-23 DIAGNOSIS — T148XXA Other injury of unspecified body region, initial encounter: Secondary | ICD-10-CM | POA: Diagnosis not present

## 2022-10-23 DIAGNOSIS — D692 Other nonthrombocytopenic purpura: Secondary | ICD-10-CM | POA: Diagnosis not present

## 2022-10-31 ENCOUNTER — Ambulatory Visit: Payer: Medicare Other | Attending: Cardiovascular Disease | Admitting: Cardiovascular Disease

## 2022-10-31 ENCOUNTER — Encounter: Payer: Self-pay | Admitting: Cardiovascular Disease

## 2022-10-31 VITALS — BP 137/87 | HR 68 | Ht 63.5 in | Wt 104.2 lb

## 2022-10-31 DIAGNOSIS — J438 Other emphysema: Secondary | ICD-10-CM | POA: Diagnosis not present

## 2022-10-31 DIAGNOSIS — E7800 Pure hypercholesterolemia, unspecified: Secondary | ICD-10-CM | POA: Insufficient documentation

## 2022-10-31 DIAGNOSIS — R0602 Shortness of breath: Secondary | ICD-10-CM | POA: Insufficient documentation

## 2022-10-31 DIAGNOSIS — I700 Atherosclerosis of aorta: Secondary | ICD-10-CM | POA: Diagnosis not present

## 2022-10-31 NOTE — Progress Notes (Signed)
Cardiology Office Note:  .   Date:  10/31/2022  ID:  Pamela Larson, DOB 29-Aug-1945, MRN [id number] PCP: Pamela Polka., MD  [facility] Providers Cardiologist:  None    History of Present Illness: .   Pamela Larson is a 77 y.o. female referred in consultation for shortness of breath, also expressing concerns about possible vascular disease.  She has been generally healthy her whole life, but did smoke until about 25 years ago (roughly 40-pack-year history of smoking).  She recently started treatment with bronchodilators and feels that her breathing has improved substantially.  She is seeing Dr. Kendrick Fries and is now seeing Dr. Everardo All in the pulmonary clinic.  Orthopnea or PND.  She can climb the stairs in her home several times a day without difficulty.  Does not have orthopnea or PND or lower extremity edema.  Problems with chest pain either at rest or with activity.  She denies palpitations, dizziness, syncope, lower extremity edema and intermittent claudication.  She is also concerned about the possibility of coronary disease and stroke.  Has friends and relatives that recently have had serious complications hide from stroke.  Previous imaging studies have shown evidence of aortic (thoracic and abdominal) atherosclerosis, after a fall in June 2023.  This showed problems with compression fracture disease in her spine and remote anterior right rib fractures as well.  A previous CT of the chest performed for lung abnormalities in 2019 described atherosclerosis with calcified plaques in the left main and left anterior descending arteries.  Has evidence of mild diffuse bronchial wall thickening and mild paraseptal emphysema especially in the lung apices.  Been taking rosuvastatin for many years.  Her most recent lipid profile is quite favorable with an excellent HDL cholesterol of 85 and an LDL cholesterol of 91.  ROS: She is with sinus drainage.  Has seen Dr. Suszanne Conners in the ENT  clinic.  Studies Reviewed: Marland Kitchen   EKG Interpretation Date/Time:  Thursday October 31 2022 11:49:20 EDT Ventricular Rate:  68 PR Interval:  166 QRS Duration:  78 QT Interval:  406 QTC Calculation: 431 R Axis:   -16  Text Interpretation: Normal sinus rhythm Low voltage QRS When compared with ECG of 27-Mar-2016 12:00, Premature ventricular complexes are no longer Present Confirmed by Emilene Roma [phone]) on 10/31/2022 12:27:31 PM     Risk Assessment/Calculations:             Physical Exam:   VS:  BP 137/87   Pulse 68   Ht 5' 3.5" (1.613 m)   Wt 104 lb 3.2 oz (47.3 kg)   SpO2 98%   BMI 18.17 kg/m    Wt Readings from Last 3 Encounters:  10/31/22 104 lb 3.2 oz (47.3 kg)  08/20/22 99 lb 4.8 oz (45 kg)  06/12/22 103 lb (46.7 kg)    GEN: Well nourished, well developed in no acute distress NECK: No JVD; No carotid bruits CARDIAC: RRR, no murmurs, rubs, gallops, normal distal pulses in all 4 extremities, no abdominal pulsatile masses RESPIRATORY:  Clear to auscultation without rales, wheezing or rhonchi  ABDOMEN: Soft, non-tender, non-distended EXTREMITIES:  No edema; No deformity   ASSESSMENT AND PLAN: .    Her shortness of breath has improved with treatment with bronchodilators.  She does not have signs or symptoms that would suggest overt heart failure.  She has a normal cardiovascular examination.  She is concerned about the possibility of vascular complications due to her history of smoking and her  hypercholesterolemia and is especially concerned about the possibility of stroke.  Currently asymptomatic.  I think the most efficient way to estimate her risk for these various vascular complications would be to get a Vascuscreen study that contains an evaluation for carotid stenosis, abdominal aortic aneurysm and PAD of the lower extremities also to estimate her long-term cardiovascular prognosis with a coronary calcium score.  We do identify focal problems such as carotid  stenosis or abdominal aortic aneurysm we will then focus and on better defining these abnormalities.  If her coronary calcium score is above average we will recommend intensifying her lipid-lowering therapy to a target LDL cholesterol less than 70.  She has quit smoking, has normal glucose levels and normal blood pressure.  Otherwise, at this point she appears to be doing well and does not have any localizing symptoms.  Can follow-up with cardiology as needed if we do not find problems on the above test.       Dispo: Coronary calcium score CT, vascuscreen, follow-up as needed  Signed, Thurmon Fair, MD

## 2022-10-31 NOTE — Patient Instructions (Addendum)
Medication Instructions:  No changes *If you need a refill on your cardiac medications before your next appointment, please call your pharmacy*  Testing/Procedures: Dr Royann Shivers has ordered a CT coronary calcium score.   Test locations:  [facility]  [facility]  This is $99 out of pocket.   Coronary CalciumScan A coronary calcium scan is an imaging test used to look for deposits of calcium and other fatty materials (plaques) in the inner lining of the blood vessels of the heart (coronary arteries). These deposits of calcium and plaques can partly clog and narrow the coronary arteries without producing any symptoms or warning signs. This puts a person at risk for a heart attack. This test can detect these deposits before symptoms develop. Tell a health care provider about: Any allergies you have. All medicines you are taking, including vitamins, herbs, eye drops, creams, and over-the-counter medicines. Any problems you or family members have had with anesthetic medicines. Any blood disorders you have. Any surgeries you have had. Any medical conditions you have. Whether you are pregnant or may be pregnant. What are the risks? Generally, this is a safe procedure. However, problems may occur, including: Harm to a pregnant woman and her unborn baby. This test involves the use of radiation. Radiation exposure can be dangerous to a pregnant woman and her unborn baby. If you are pregnant, you generally should not have this procedure done. Slight increase in the risk of cancer. This is because of the radiation involved in the test. What happens before the procedure? No preparation is needed for this procedure. What happens during the procedure? You will undress and remove any jewelry around your neck or chest. You will put on a hospital gown. Sticky electrodes will be placed on your chest. The  electrodes will be connected to an electrocardiogram (ECG) machine to record a tracing of the electrical activity of your heart. A CT scanner will take pictures of your heart. During this time, you will be asked to lie still and hold your breath for 2-3 seconds while a picture of your heart is being taken. The procedure may vary among health care providers and hospitals. What happens after the procedure? You can get dressed. You can return to your normal activities. It is up to you to get the results of your test. Ask your health care provider, or the department that is doing the test, when your results will be ready. Summary A coronary calcium scan is an imaging test used to look for deposits of calcium and other fatty materials (plaques) in the inner lining of the blood vessels of the heart (coronary arteries). Generally, this is a safe procedure. Tell your health care provider if you are pregnant or may be pregnant. No preparation is needed for this procedure. A CT scanner will take pictures of your heart. You can return to your normal activities after the scan is done. This information is not intended to replace advice given to you by your health care provider. Make sure you discuss any questions you have with your health care provider. Document Released: 07/27/2007 Document Revised: 12/18/2015 Document Reviewed: 12/18/2015 Elsevier Interactive Patient Education  2017 Elsevier Inc.    Vascuscreen: includes Carotid and abdominal aorta dopplers And ankle brachial index  Self pay: $125  Your physician has requested that you have a carotid duplex. This test is an ultrasound of the carotid arteries in your neck. It looks at blood  flow through these arteries that supply the brain with blood. Allow one hour for this exam. There are no restrictions or special instructions.   Your physician has requested that you have an abdominal aorta duplex. During this test, an ultrasound is used to evaluate  the aorta. Allow 30 minutes for this exam. Do not eat after midnight the day before and avoid carbonated beverages   Your physician has requested that you have an ankle brachial index (ABI). During this test an ultrasound and blood pressure cuff are used to evaluate the arteries that supply the arms and legs with blood. Allow thirty minutes for this exam. There are no restrictions or special instructions.    Follow-Up: At [facility], you and your health needs are our priority.  As part of our continuing mission to provide you with exceptional heart care, we have created designated Provider Care Teams.  These Care Teams include your primary Cardiologist (physician) and Advanced Practice Providers (APPs -  Physician Assistants and Nurse Practitioners) who all work together to provide you with the care you need, when you need it.  We recommend signing up for the patient portal called "MyChart".  Sign up information is provided on this After Visit Summary.  MyChart is used to connect with patients for Virtual Visits (Telemedicine).  Patients are able to view lab/test results, encounter notes, upcoming appointments, etc.  Non-urgent messages can be sent to your provider as well.   To learn more about what you can do with MyChart, go to [email or web address].    Your next appointment:    Follow up as needed  Provider:   Dr Royann Shivers

## 2022-11-02 DIAGNOSIS — Z23 Encounter for immunization: Secondary | ICD-10-CM | POA: Diagnosis not present

## 2022-11-06 DIAGNOSIS — D225 Melanocytic nevi of trunk: Secondary | ICD-10-CM | POA: Diagnosis not present

## 2022-11-06 DIAGNOSIS — L820 Inflamed seborrheic keratosis: Secondary | ICD-10-CM | POA: Diagnosis not present

## 2022-11-06 DIAGNOSIS — L821 Other seborrheic keratosis: Secondary | ICD-10-CM | POA: Diagnosis not present

## 2022-11-06 DIAGNOSIS — D1801 Hemangioma of skin and subcutaneous tissue: Secondary | ICD-10-CM | POA: Diagnosis not present

## 2022-11-06 DIAGNOSIS — D692 Other nonthrombocytopenic purpura: Secondary | ICD-10-CM | POA: Diagnosis not present

## 2022-11-06 DIAGNOSIS — D485 Neoplasm of uncertain behavior of skin: Secondary | ICD-10-CM | POA: Diagnosis not present

## 2022-11-08 ENCOUNTER — Ambulatory Visit (HOSPITAL_COMMUNITY)
Admission: RE | Admit: 2022-11-08 | Discharge: 2022-11-08 | Disposition: A | Discharge status: Home / Self Care | Payer: Medicare Other | Source: Ambulatory Visit | Attending: Internal Medicine | Admitting: Internal Medicine

## 2022-11-08 DIAGNOSIS — I700 Atherosclerosis of aorta: Secondary | ICD-10-CM

## 2022-11-15 ENCOUNTER — Telehealth: Payer: Self-pay | Admitting: Family Medicine

## 2022-11-15 DIAGNOSIS — B370 Candidal stomatitis: Secondary | ICD-10-CM | POA: Diagnosis not present

## 2022-11-15 DIAGNOSIS — R067 Sneezing: Secondary | ICD-10-CM | POA: Diagnosis not present

## 2022-11-15 DIAGNOSIS — R059 Cough, unspecified: Secondary | ICD-10-CM | POA: Diagnosis not present

## 2022-11-15 DIAGNOSIS — R0981 Nasal congestion: Secondary | ICD-10-CM | POA: Diagnosis not present

## 2022-11-15 DIAGNOSIS — J441 Chronic obstructive pulmonary disease with (acute) exacerbation: Secondary | ICD-10-CM | POA: Diagnosis not present

## 2022-11-15 DIAGNOSIS — J019 Acute sinusitis, unspecified: Secondary | ICD-10-CM | POA: Diagnosis not present

## 2022-11-15 DIAGNOSIS — Z1152 Encounter for screening for COVID-19: Secondary | ICD-10-CM | POA: Diagnosis not present

## 2022-11-15 DIAGNOSIS — R5383 Other fatigue: Secondary | ICD-10-CM | POA: Diagnosis not present

## 2022-11-15 MED ORDER — MONTELUKAST SODIUM 10 MG PO TABS: 10.0000 mg | ORAL_TABLET | Freq: Every day | ORAL | 1 refills | Status: DC

## 2022-11-15 NOTE — Telephone Encounter (Signed)
Sent in 90 day montleukast to [facility] pt last seen July 2024 by dr Lucie Leather

## 2022-11-15 NOTE — Telephone Encounter (Signed)
Patient request 90 day refill for Montelukast she would like it sent to [facility].

## 2022-11-18 ENCOUNTER — Telehealth: Payer: Self-pay | Admitting: Pulmonary Disease

## 2022-11-18 ENCOUNTER — Encounter (HOSPITAL_COMMUNITY): Payer: Self-pay | Admitting: Cardiology

## 2022-11-18 NOTE — Telephone Encounter (Signed)
Patient states still having symptoms of fever, runny nose, cough and sore throat. Went to primary care doctor. Prescribed antibx and Prednisone. Negative for covid 19 and flu. Would like to know if it is RSV. Also would like x ray. Patient phone number is [phone].

## 2022-11-18 NOTE — Telephone Encounter (Signed)
Called and spoke to patient.  She reports runny nose, sneezing, dry cough, sore throat, headache,loss of appetite, low grade temp of 99 on Friday(normal temp is 97.4) x4d Seen PCP on Friday and was dx with sinus infection. Tx with cefdinir. Called ENT and was started on prednisone.  A friend of hers was recently dx with RVS. She is concerned that she may have RVS. She is questioning if she should be tested.   She is using Anoro once daily. She has not used albuterol. No OTC meds.  Negative covid  and Flu test on Friday. Sx have slightly improved with abx and prednisone.  Katie, please advise. Thanks Dr. Everardo All is unavailable.

## 2022-11-18 NOTE — Telephone Encounter (Signed)
Patient is aware of below message/recommendations and voiced her understanding.  Nothing further needed.  

## 2022-11-18 NOTE — Telephone Encounter (Signed)
No need to test for RSV as treatment is supportive care measures. The viral symptoms can last up to 10 days but do sometimes linger for longer than this. She can continue the cefdinir (although, likely not bacterial) and the prednisone. Recommend she use mucinex DM, saline nasal rinses and flonase nasal spray OTC. Thanks!

## 2022-11-21 ENCOUNTER — Telehealth: Payer: Self-pay | Admitting: Pulmonary Disease

## 2022-11-21 MED ORDER — PREDNISONE 10 MG PO TABS: 40.0000 mg | ORAL_TABLET | Freq: Every day | ORAL | 0 refills | Status: AC

## 2022-11-21 NOTE — Telephone Encounter (Signed)
Patient notified this was being sent in. She currently is on prednisone 20 mg a day since Friday. She has been advised to increase to 40 mg for the next 5 days until appt in office. She has agreed and medication has been sent to [facility].

## 2022-11-21 NOTE — Telephone Encounter (Signed)
Pt has a bad sinus infection. PCP has her on Cesdinir and pred. She is wondering if she should switch to Levofloxacin 500 mg. Has acute appt but before that appt she wanted to speak to a nurse.    Lungs Hurt Cough Has COPD Has deviated septum SOB May have has RSV but not tested   [phone]

## 2022-11-21 NOTE — Telephone Encounter (Signed)
Recommend prednisone 40 mg x 5 days. Please contact patient that this is what I recommend for symptom management and to determine preferred pharmacy.

## 2022-11-21 NOTE — Addendum Note (Signed)
Addended by: Jama Flavors on: 11/21/2022 04:56 PM   Modules accepted: Orders

## 2022-11-21 NOTE — Telephone Encounter (Signed)
Called and spoke with patient, she states she saw her PCP on 11/15/2022, she was prescribed Cefdinir for a sinus infection.  She said her head feels better, but now she has chest tightness and wheezing that her albuterol inhaler only temporarily relieves.  She is using her Anoro as prescribed and taking mucinex as well. She was running a low grade fever of 99 and now her temperature is down to 98.2.  She says her normal temperature is 97.2-97.4.  She feels like maybe she had RSV, she says she was lethargic, HA, decrease appetite.  She said the headache is better, she is just concerned about the chest tightness.  She has an appointment to see Dr. Isaiah Serge on 10/15, however, she wants to know if there is something she can be doing between now and then and if she needs to get a cxr prior to her appointment.  Dr. Everardo All, please advise.  Thank you.

## 2022-11-26 ENCOUNTER — Ambulatory Visit: Payer: BLUE CROSS/BLUE SHIELD | Admitting: Pulmonary Disease

## 2022-11-28 ENCOUNTER — Ambulatory Visit (HOSPITAL_BASED_OUTPATIENT_CLINIC_OR_DEPARTMENT_OTHER)
Admission: RE | Admit: 2022-11-28 | Discharge: 2022-11-28 | Disposition: A | Discharge status: Home / Self Care | Payer: BLUE CROSS/BLUE SHIELD | Source: Ambulatory Visit | Attending: Cardiovascular Disease | Admitting: Cardiovascular Disease

## 2022-11-28 DIAGNOSIS — R0602 Shortness of breath: Secondary | ICD-10-CM | POA: Insufficient documentation

## 2022-11-28 DIAGNOSIS — I700 Atherosclerosis of aorta: Secondary | ICD-10-CM | POA: Insufficient documentation

## 2022-11-29 NOTE — Telephone Encounter (Signed)
This sick message was addressed on 11/21/22 on a different telephone encounter thread. Prednisone had been called in and patient was notified.  Closing this encounter.

## 2022-11-29 NOTE — Telephone Encounter (Signed)
Routing to Dr. Everardo All as no response from Triage in several days. This was sent High Priority

## 2022-12-18 DIAGNOSIS — R0981 Nasal congestion: Secondary | ICD-10-CM | POA: Diagnosis not present

## 2022-12-18 DIAGNOSIS — R5383 Other fatigue: Secondary | ICD-10-CM | POA: Diagnosis not present

## 2022-12-18 DIAGNOSIS — J019 Acute sinusitis, unspecified: Secondary | ICD-10-CM | POA: Diagnosis not present

## 2022-12-18 DIAGNOSIS — M0600 Rheumatoid arthritis without rheumatoid factor, unspecified site: Secondary | ICD-10-CM | POA: Diagnosis not present

## 2022-12-18 DIAGNOSIS — Z1152 Encounter for screening for COVID-19: Secondary | ICD-10-CM | POA: Diagnosis not present

## 2022-12-18 DIAGNOSIS — R059 Cough, unspecified: Secondary | ICD-10-CM | POA: Diagnosis not present

## 2023-01-02 DIAGNOSIS — J069 Acute upper respiratory infection, unspecified: Secondary | ICD-10-CM | POA: Diagnosis not present

## 2023-01-02 DIAGNOSIS — R5383 Other fatigue: Secondary | ICD-10-CM | POA: Diagnosis not present

## 2023-01-02 DIAGNOSIS — R0981 Nasal congestion: Secondary | ICD-10-CM | POA: Diagnosis not present

## 2023-01-02 DIAGNOSIS — R519 Headache, unspecified: Secondary | ICD-10-CM | POA: Diagnosis not present

## 2023-01-02 DIAGNOSIS — R059 Cough, unspecified: Secondary | ICD-10-CM | POA: Diagnosis not present

## 2023-01-02 DIAGNOSIS — Z1152 Encounter for screening for COVID-19: Secondary | ICD-10-CM | POA: Diagnosis not present

## 2023-01-07 ENCOUNTER — Telehealth: Payer: Self-pay | Admitting: Cardiovascular Disease

## 2023-01-07 NOTE — Telephone Encounter (Signed)
Pt calling to get results of test

## 2023-01-07 NOTE — Telephone Encounter (Signed)
Pamela Fair, MD 12/02/2022  1:57 PM EDT     Excellent news. The burden of coronary plaque as measured by the calcium score is very low and much lower than average for age. Please keep up the good job you are doing with a healthy lifestyle. There is no need to start medications for cholesterol lowering.    Patient identification verified by 2 forms. Pamela Rail, RN    Called and spoke to patient  Patient states received a notification for mychart message but unable to view  RN reviewed provider message  Advised patient per 9/19 appointment, follow up as needed  Patient verbalized understanding, no questions at this time

## 2023-02-11 DIAGNOSIS — M25511 Pain in right shoulder: Secondary | ICD-10-CM | POA: Diagnosis not present

## 2023-02-13 ENCOUNTER — Encounter (HOSPITAL_BASED_OUTPATIENT_CLINIC_OR_DEPARTMENT_OTHER): Payer: Self-pay | Admitting: Emergency Medicine

## 2023-02-13 ENCOUNTER — Other Ambulatory Visit: Payer: Self-pay

## 2023-02-13 ENCOUNTER — Emergency Department (HOSPITAL_BASED_OUTPATIENT_CLINIC_OR_DEPARTMENT_OTHER): Payer: Medicare Other | Admitting: Radiology

## 2023-02-13 ENCOUNTER — Emergency Department (HOSPITAL_BASED_OUTPATIENT_CLINIC_OR_DEPARTMENT_OTHER)
Admission: EM | Admit: 2023-02-13 | Discharge: 2023-02-13 | Disposition: A | Discharge status: Home / Self Care | Payer: Medicare Other | Attending: Emergency Medicine | Admitting: Emergency Medicine

## 2023-02-13 ENCOUNTER — Emergency Department (HOSPITAL_BASED_OUTPATIENT_CLINIC_OR_DEPARTMENT_OTHER): Payer: Medicare Other

## 2023-02-13 DIAGNOSIS — Y92017 Garden or yard in single-family (private) house as the place of occurrence of the external cause: Secondary | ICD-10-CM | POA: Insufficient documentation

## 2023-02-13 DIAGNOSIS — S00212A Abrasion of left eyelid and periocular area, initial encounter: Secondary | ICD-10-CM | POA: Diagnosis not present

## 2023-02-13 DIAGNOSIS — Z96611 Presence of right artificial shoulder joint: Secondary | ICD-10-CM | POA: Diagnosis not present

## 2023-02-13 DIAGNOSIS — W01198A Fall on same level from slipping, tripping and stumbling with subsequent striking against other object, initial encounter: Secondary | ICD-10-CM | POA: Diagnosis not present

## 2023-02-13 DIAGNOSIS — S0181XA Laceration without foreign body of other part of head, initial encounter: Secondary | ICD-10-CM | POA: Insufficient documentation

## 2023-02-13 DIAGNOSIS — Z043 Encounter for examination and observation following other accident: Secondary | ICD-10-CM | POA: Diagnosis not present

## 2023-02-13 DIAGNOSIS — J449 Chronic obstructive pulmonary disease, unspecified: Secondary | ICD-10-CM | POA: Insufficient documentation

## 2023-02-13 DIAGNOSIS — I2510 Atherosclerotic heart disease of native coronary artery without angina pectoris: Secondary | ICD-10-CM | POA: Insufficient documentation

## 2023-02-13 DIAGNOSIS — M546 Pain in thoracic spine: Secondary | ICD-10-CM | POA: Insufficient documentation

## 2023-02-13 DIAGNOSIS — S0993XA Unspecified injury of face, initial encounter: Secondary | ICD-10-CM | POA: Diagnosis present

## 2023-02-13 DIAGNOSIS — W19XXXA Unspecified fall, initial encounter: Secondary | ICD-10-CM

## 2023-02-13 DIAGNOSIS — M25511 Pain in right shoulder: Secondary | ICD-10-CM | POA: Insufficient documentation

## 2023-02-13 DIAGNOSIS — R410 Disorientation, unspecified: Secondary | ICD-10-CM | POA: Diagnosis not present

## 2023-02-13 MED ORDER — ACETAMINOPHEN 500 MG PO TABS
1000.0000 mg | ORAL_TABLET | Freq: Once | ORAL | Status: AC
  Administered 2023-02-13: 1000 mg via ORAL
  Filled 2023-02-13: qty 2

## 2023-02-13 MED ORDER — OXYCODONE HCL 5 MG PO TABS
5.0000 mg | ORAL_TABLET | Freq: Once | ORAL | Status: AC
  Administered 2023-02-13: 5 mg via ORAL
  Filled 2023-02-13: qty 1

## 2023-02-13 MED ORDER — OXYCODONE HCL 5 MG PO TABS
5.0000 mg | ORAL_TABLET | Freq: Four times a day (QID) | ORAL | 0 refills | Status: AC | PRN
Start: 2023-02-13 — End: 2023-02-16

## 2023-02-13 NOTE — ED Triage Notes (Signed)
 Mechanical fall today w/ + LOC. Denies blood thinners. No unilateral deficits. Multiple skin tears and abrasions on face. Large skin tear on R hand. Patient reports ongoing confusion,

## 2023-02-13 NOTE — ED Notes (Signed)
Patient transported to X-Ray 

## 2023-02-13 NOTE — ED Notes (Signed)
 Patient transported to CT

## 2023-02-13 NOTE — ED Notes (Signed)
 RN reviewed discharge instructions with pt. Pt verbalized understanding and had no further questions. VSS upon discharge.

## 2023-02-13 NOTE — ED Provider Notes (Signed)
 [facility] AT [facility] Provider Note   CSN: [id number] Arrival date & time: 02/13/23  1559     History Chief Complaint  Patient presents with   Fall    HPI Pamela Larson is a 78 y.o. female presenting for chief complaint of fall.  78 year old female with a history of COPD, high cholesterol, CAD.  Lost her balance while touring a house today tripping on a stone in the yard.  Fell over hitting her chin.  She denies fevers chills nausea vomiting syncope shortness of breath.  Having a headache and feels very groggy.  She does endorse that she had a fall earlier this week where she hurt her right arm and has been using muscle relaxers and pain medication as well as a prescribed steroid. States that that has made her feel groggy all week. No known sick contacts otherwise.  Right hand skin tear but feels that her hand is working normally.  Abrasion on left eyebrow and skin tear under right side of her chin.  Otherwise she is having left-sided thoracic pain and right-sided shoulder pain.   Patient's recorded medical, surgical, social, medication list and allergies were reviewed in the Snapshot window as part of the initial history.   Review of Systems   Review of Systems  Constitutional:  Negative for chills and fever.  HENT:  Negative for ear pain and sore throat.   Eyes:  Negative for pain and visual disturbance.  Respiratory:  Negative for cough and shortness of breath.   Cardiovascular:  Negative for chest pain and palpitations.  Gastrointestinal:  Negative for abdominal pain and vomiting.  Genitourinary:  Negative for dysuria and hematuria.  Musculoskeletal:  Negative for arthralgias and back pain.  Skin:  Negative for color change and rash.  Neurological:  Negative for seizures and syncope.  All other systems reviewed and are negative.   Physical Exam Updated Vital Signs BP 139/67   Pulse 70   Temp 98.2 F (36.8 C) (Oral)   Resp 17   Ht 5'  (1.524 m)   Wt 47.2 kg   SpO2 96%   BMI 20.31 kg/m  Physical Exam Vitals and nursing note reviewed.  Constitutional:      General: She is not in acute distress.    Appearance: She is well-developed.  HENT:     Head: Normocephalic and atraumatic.  Eyes:     Conjunctiva/sclera: Conjunctivae normal.  Cardiovascular:     Rate and Rhythm: Normal rate and regular rhythm.     Heart sounds: No murmur heard. Pulmonary:     Effort: Pulmonary effort is normal. No respiratory distress.     Breath sounds: Normal breath sounds.  Abdominal:     General: There is no distension.     Palpations: Abdomen is soft.     Tenderness: There is no abdominal tenderness. There is no right CVA tenderness or left CVA tenderness.  Musculoskeletal:        General: Signs of injury (Skin tear right hand.  Dorsal surface.  Abrasion under chin.  Abrasion over left eye.  TTP on left thoracic rib cage.) present. No swelling or tenderness. Normal range of motion.     Cervical back: Neck supple.  Skin:    General: Skin is warm and dry.  Neurological:     General: No focal deficit present.     Mental Status: She is alert and oriented to person, place, and time. Mental status is at baseline.  Cranial Nerves: No cranial nerve deficit.      ED Course/ Medical Decision Making/ A&P    Procedures .Laceration Repair  Date/Time: 02/13/2023 10:58 PM  Performed by: Jerral Meth, MD Authorized by: Jerral Meth, MD   Consent:    Consent obtained:  Verbal   Consent given by:  Parent   Risks, benefits, and alternatives were discussed: yes   Laceration details:    Location:  Face   Face location:  Chin   Length (cm):  3.5 Treatment:    Amount of cleaning:  Standard Skin repair:    Repair method:  Tissue adhesive Approximation:    Approximation:  Close Repair type:    Repair type:  Simple Post-procedure details:    Procedure completion:  Tolerated Comments:     Both chin and eyelid lacerations  were irrigated copiously with saline and repaired with Dermabond approximate length of combination 3.5 cm    Medications Ordered in ED Medications  oxyCODONE  (Oxy IR/ROXICODONE ) immediate release tablet 5 mg (5 mg Oral Given 02/13/23 1754)  acetaminophen  (TYLENOL ) tablet 1,000 mg (1,000 mg Oral Given 02/13/23 1754)   Medical Decision Making:    Pamela Larson is a 78 y.o. female who presented to the ED today with a moderate mechanisma trauma, detailed above.    Additional history discussed with patient's family/caregivers.  Patient placed on continuous vitals and telemetry monitoring while in ED which was reviewed periodically.   Given this mechanism of trauma, a full physical exam was performed. Notably, patient was HDS in NAD.   Reviewed and confirmed nursing documentation for past medical history, family history, social history.    Initial Assessment/Plan:   This is a patient presenting with a moderate mechanism trauma.  As such, I have considered intracranial injuries including intracranial hemorrhage, intrathoracic injuries including blunt myocardial or blunt lung injury, blunt abdominal injuries including aortic dissection, bladder injury, spleen injury, liver injury and I have considered orthopedic injuries including extremity or spinal injury.  With the patient's presentation of moderate mechanism trauma but an otherwise reassuring exam, patient warrants targeted evaluation for potential traumatic injuries. Will proceed with targeted evaluation for potential injuries. Will proceed with CTH/CTCspine/CXR w ribs/Right shoulder XR. Objective evaluation resulted with NAA.  Disposition:  I have considered need for hospitalization, however, considering all of the above, I believe this patient is stable for discharge at this time.  Patient/family educated about specific return precautions for given chief complaint and symptoms.  Patient/family educated about follow-up with PCP.      Patient/family expressed understanding of return precautions and need for follow-up. Patient spoken to regarding all imaging and laboratory results and appropriate follow up for these results. All education provided in verbal form with additional information in written form. Time was allowed for answering of patient questions. Patient discharged.    Emergency Department Medication Summary:   Medications  oxyCODONE  (Oxy IR/ROXICODONE ) immediate release tablet 5 mg (5 mg Oral Given 02/13/23 1754)  acetaminophen  (TYLENOL ) tablet 1,000 mg (1,000 mg Oral Given 02/13/23 1754)           Clinical Impression:  1. Fall, initial encounter      Discharge   Final Clinical Impression(s) / ED Diagnoses Final diagnoses:  Fall, initial encounter    Rx / DC Orders ED Discharge Orders          Ordered    oxyCODONE  (ROXICODONE ) 5 MG immediate release tablet  Every 6 hours PRN        02/13/23 2056  Jerral Meth, MD 02/13/23 2259

## 2023-02-13 NOTE — ED Notes (Signed)
 ED Provider at bedside.

## 2023-02-13 NOTE — ED Notes (Addendum)
 ED Provider at bedside.

## 2023-02-14 DIAGNOSIS — M25511 Pain in right shoulder: Secondary | ICD-10-CM | POA: Diagnosis not present

## 2023-02-18 DIAGNOSIS — Z96611 Presence of right artificial shoulder joint: Secondary | ICD-10-CM | POA: Diagnosis not present

## 2023-02-18 DIAGNOSIS — M542 Cervicalgia: Secondary | ICD-10-CM | POA: Diagnosis not present

## 2023-02-24 DIAGNOSIS — Z1231 Encounter for screening mammogram for malignant neoplasm of breast: Secondary | ICD-10-CM | POA: Diagnosis not present

## 2023-02-25 ENCOUNTER — Ambulatory Visit: Payer: Medicare Other | Admitting: Allergy and Immunology

## 2023-03-06 DIAGNOSIS — M542 Cervicalgia: Secondary | ICD-10-CM | POA: Diagnosis not present

## 2023-03-06 DIAGNOSIS — Z96611 Presence of right artificial shoulder joint: Secondary | ICD-10-CM | POA: Diagnosis not present

## 2023-03-10 DIAGNOSIS — J069 Acute upper respiratory infection, unspecified: Secondary | ICD-10-CM | POA: Diagnosis not present

## 2023-03-10 DIAGNOSIS — M0600 Rheumatoid arthritis without rheumatoid factor, unspecified site: Secondary | ICD-10-CM | POA: Diagnosis not present

## 2023-03-10 DIAGNOSIS — R0981 Nasal congestion: Secondary | ICD-10-CM | POA: Diagnosis not present

## 2023-03-10 DIAGNOSIS — R051 Acute cough: Secondary | ICD-10-CM | POA: Diagnosis not present

## 2023-03-10 DIAGNOSIS — J40 Bronchitis, not specified as acute or chronic: Secondary | ICD-10-CM | POA: Diagnosis not present

## 2023-03-11 ENCOUNTER — Ambulatory Visit: Payer: Medicare Other | Admitting: Allergy and Immunology

## 2023-03-11 DIAGNOSIS — M5412 Radiculopathy, cervical region: Secondary | ICD-10-CM | POA: Diagnosis not present

## 2023-03-26 DIAGNOSIS — M5412 Radiculopathy, cervical region: Secondary | ICD-10-CM | POA: Diagnosis not present

## 2023-03-31 DIAGNOSIS — H43812 Vitreous degeneration, left eye: Secondary | ICD-10-CM | POA: Diagnosis not present

## 2023-04-08 DIAGNOSIS — M5412 Radiculopathy, cervical region: Secondary | ICD-10-CM | POA: Diagnosis not present

## 2023-04-17 DIAGNOSIS — H43812 Vitreous degeneration, left eye: Secondary | ICD-10-CM | POA: Diagnosis not present

## 2023-04-18 ENCOUNTER — Telehealth (INDEPENDENT_AMBULATORY_CARE_PROVIDER_SITE_OTHER): Payer: Self-pay | Admitting: Otolaryngology

## 2023-04-18 NOTE — Telephone Encounter (Signed)
 Attempted to confirm appt and address for appt on 04/21/2023, pt phone hung up.

## 2023-04-21 ENCOUNTER — Ambulatory Visit (INDEPENDENT_AMBULATORY_CARE_PROVIDER_SITE_OTHER): Payer: BLUE CROSS/BLUE SHIELD | Admitting: Otolaryngology

## 2023-04-21 VITALS — BP 152/85 | HR 81 | Ht 59.5 in | Wt 102.0 lb

## 2023-04-21 DIAGNOSIS — R0981 Nasal congestion: Secondary | ICD-10-CM

## 2023-04-21 DIAGNOSIS — J343 Hypertrophy of nasal turbinates: Secondary | ICD-10-CM | POA: Diagnosis not present

## 2023-04-21 DIAGNOSIS — J310 Chronic rhinitis: Secondary | ICD-10-CM | POA: Diagnosis not present

## 2023-04-21 MED ORDER — IPRATROPIUM BROMIDE 0.06 % NA SOLN: 2.0000 | Freq: Two times a day (BID) | NASAL | 12 refills | Status: DC | PRN

## 2023-04-21 MED ORDER — FLUTICASONE PROPIONATE 50 MCG/ACT NA SUSP: 2.0000 | Freq: Every day | NASAL | 10 refills | Status: DC

## 2023-04-21 MED ORDER — PREDNISONE 10 MG (21) PO TBPK: ORAL_TABLET | ORAL | 0 refills | Status: DC

## 2023-04-21 MED ORDER — AZELASTINE HCL 0.1 % NA SOLN: 2.0000 | Freq: Two times a day (BID) | NASAL | 8 refills | Status: DC

## 2023-04-21 NOTE — Progress Notes (Unsigned)
 Patient ID: Pamela Larson, female   DOB: April 24, 1945, 78 y.o.   MRN: [id number]  Follow-up: Recurrent sinus infections  HPI: The patient is a 78 year old female who returns today for her follow-up evaluation. The patient was previously seen for recurrent sinus infections.  At her last visit, she was noted to have nasal mucosal congestion and bilateral inferior turbinate hypertrophy.  No acute infection was noted at that time.  The patient was continued on Flonase, azelastine, and Atrovent.  The patient returns today reporting no new infection over the past 6 months.  She has noted occasional nasal congestion.  Currently she denies any fever or visual change.   Anesthesia: None  Description: Risks, benefits, and alternatives of flexible endoscopy were explained to the patient.  Specific mention was made of the risk of throat numbness with difficulty swallowing, possible bleeding from the nose and mouth, and pain from the procedure.  The patient gave oral consent to proceed.  The flexible scope was inserted into the right nasal cavity.  Endoscopy of the interior nasal cavity, superior, inferior, and middle meatus was performed. The sphenoid-ethmoid recess was examined. Mildly edematous mucosa was noted.  No polyp, mass, or lesion was appreciated. Olfactory cleft was clear.  Nasopharynx was clear.  Turbinates were hypertrophied but without mass. The procedure was repeated on the contralateral side with similar findings.  The patient tolerated the procedure well.  Assessment:  1. Chronic rhinitis with nasal mucosal congestion and bilateral inferior turbinate hypertrophy.  2. No acute infection is noted today. Her sinus openings are patent.   Plan:  1. The nasal endoscopy findings are reviewed with the patient.  2. The patient is reassured and no acute infection is noted today.  3. Continue with Flonase, azelastine, and Atrovent nasal sprays.  4. The patient will return for reevaluation in 6  months, sooner if needed.

## 2023-04-22 ENCOUNTER — Other Ambulatory Visit: Payer: Self-pay | Admitting: Allergy and Immunology

## 2023-04-22 DIAGNOSIS — J310 Chronic rhinitis: Secondary | ICD-10-CM | POA: Insufficient documentation

## 2023-04-22 DIAGNOSIS — J343 Hypertrophy of nasal turbinates: Secondary | ICD-10-CM | POA: Insufficient documentation

## 2023-04-23 ENCOUNTER — Telehealth (HOSPITAL_BASED_OUTPATIENT_CLINIC_OR_DEPARTMENT_OTHER): Payer: Self-pay | Admitting: Pulmonary Disease

## 2023-04-23 MED ORDER — ANORO ELLIPTA 62.5-25 MCG/ACT IN AEPB: 1.0000 | INHALATION_SPRAY | Freq: Every day | RESPIRATORY_TRACT | 0 refills | Status: DC

## 2023-04-23 NOTE — Telephone Encounter (Signed)
 Rx sent to pharmacy and pt notified  ?

## 2023-05-06 ENCOUNTER — Other Ambulatory Visit: Payer: Self-pay | Admitting: Allergy and Immunology

## 2023-05-06 NOTE — Telephone Encounter (Signed)
 Omeprazole 40 mg denied at [facility]. Patient is due for a 6 month follow up.

## 2023-05-15 DIAGNOSIS — E785 Hyperlipidemia, unspecified: Secondary | ICD-10-CM | POA: Diagnosis not present

## 2023-05-15 DIAGNOSIS — D472 Monoclonal gammopathy: Secondary | ICD-10-CM | POA: Diagnosis not present

## 2023-05-15 DIAGNOSIS — M810 Age-related osteoporosis without current pathological fracture: Secondary | ICD-10-CM | POA: Diagnosis not present

## 2023-05-15 DIAGNOSIS — E7849 Other hyperlipidemia: Secondary | ICD-10-CM | POA: Diagnosis not present

## 2023-05-15 DIAGNOSIS — E559 Vitamin D deficiency, unspecified: Secondary | ICD-10-CM | POA: Diagnosis not present

## 2023-05-15 DIAGNOSIS — D510 Vitamin B12 deficiency anemia due to intrinsic factor deficiency: Secondary | ICD-10-CM | POA: Diagnosis not present

## 2023-05-15 DIAGNOSIS — E039 Hypothyroidism, unspecified: Secondary | ICD-10-CM | POA: Diagnosis not present

## 2023-05-15 DIAGNOSIS — I2510 Atherosclerotic heart disease of native coronary artery without angina pectoris: Secondary | ICD-10-CM | POA: Diagnosis not present

## 2023-05-15 DIAGNOSIS — R5382 Chronic fatigue, unspecified: Secondary | ICD-10-CM | POA: Diagnosis not present

## 2023-05-21 ENCOUNTER — Other Ambulatory Visit: Payer: Self-pay | Admitting: Allergy and Immunology

## 2023-05-22 DIAGNOSIS — E039 Hypothyroidism, unspecified: Secondary | ICD-10-CM | POA: Diagnosis not present

## 2023-05-22 DIAGNOSIS — J449 Chronic obstructive pulmonary disease, unspecified: Secondary | ICD-10-CM | POA: Diagnosis not present

## 2023-05-22 DIAGNOSIS — R82998 Other abnormal findings in urine: Secondary | ICD-10-CM | POA: Diagnosis not present

## 2023-05-22 DIAGNOSIS — D510 Vitamin B12 deficiency anemia due to intrinsic factor deficiency: Secondary | ICD-10-CM | POA: Diagnosis not present

## 2023-05-22 DIAGNOSIS — I700 Atherosclerosis of aorta: Secondary | ICD-10-CM | POA: Diagnosis not present

## 2023-05-22 DIAGNOSIS — M5412 Radiculopathy, cervical region: Secondary | ICD-10-CM | POA: Diagnosis not present

## 2023-05-22 DIAGNOSIS — D472 Monoclonal gammopathy: Secondary | ICD-10-CM | POA: Diagnosis not present

## 2023-05-22 DIAGNOSIS — M0600 Rheumatoid arthritis without rheumatoid factor, unspecified site: Secondary | ICD-10-CM | POA: Diagnosis not present

## 2023-05-22 DIAGNOSIS — F325 Major depressive disorder, single episode, in full remission: Secondary | ICD-10-CM | POA: Diagnosis not present

## 2023-05-22 DIAGNOSIS — M810 Age-related osteoporosis without current pathological fracture: Secondary | ICD-10-CM | POA: Diagnosis not present

## 2023-05-22 DIAGNOSIS — R7301 Impaired fasting glucose: Secondary | ICD-10-CM | POA: Diagnosis not present

## 2023-05-22 DIAGNOSIS — Z1331 Encounter for screening for depression: Secondary | ICD-10-CM | POA: Diagnosis not present

## 2023-05-22 DIAGNOSIS — E785 Hyperlipidemia, unspecified: Secondary | ICD-10-CM | POA: Diagnosis not present

## 2023-05-22 DIAGNOSIS — Z0000 Encounter for general adult medical examination without abnormal findings: Secondary | ICD-10-CM | POA: Diagnosis not present

## 2023-05-22 DIAGNOSIS — M797 Fibromyalgia: Secondary | ICD-10-CM | POA: Diagnosis not present

## 2023-05-22 DIAGNOSIS — Z1339 Encounter for screening examination for other mental health and behavioral disorders: Secondary | ICD-10-CM | POA: Diagnosis not present

## 2023-05-22 DIAGNOSIS — Z23 Encounter for immunization: Secondary | ICD-10-CM | POA: Diagnosis not present

## 2023-06-09 ENCOUNTER — Ambulatory Visit (HOSPITAL_BASED_OUTPATIENT_CLINIC_OR_DEPARTMENT_OTHER): Admitting: Pulmonary Disease

## 2023-06-10 ENCOUNTER — Other Ambulatory Visit: Payer: Self-pay

## 2023-06-10 ENCOUNTER — Ambulatory Visit (INDEPENDENT_AMBULATORY_CARE_PROVIDER_SITE_OTHER): Admitting: Allergy and Immunology

## 2023-06-10 ENCOUNTER — Encounter: Payer: Self-pay | Admitting: Allergy and Immunology

## 2023-06-10 VITALS — BP 126/76 | HR 82 | Temp 98.7°F | Resp 18 | Ht 60.0 in | Wt 101.4 lb

## 2023-06-10 DIAGNOSIS — J3089 Other allergic rhinitis: Secondary | ICD-10-CM

## 2023-06-10 DIAGNOSIS — J4489 Other specified chronic obstructive pulmonary disease: Secondary | ICD-10-CM | POA: Diagnosis not present

## 2023-06-10 DIAGNOSIS — K219 Gastro-esophageal reflux disease without esophagitis: Secondary | ICD-10-CM

## 2023-06-10 DIAGNOSIS — G500 Trigeminal neuralgia: Secondary | ICD-10-CM

## 2023-06-10 MED ORDER — ANORO ELLIPTA 62.5-25 MCG/ACT IN AEPB: 1.0000 | INHALATION_SPRAY | Freq: Every day | RESPIRATORY_TRACT | 0 refills | Status: DC

## 2023-06-10 MED ORDER — MONTELUKAST SODIUM 10 MG PO TABS: 10.0000 mg | ORAL_TABLET | Freq: Every evening | ORAL | 1 refills | Status: AC

## 2023-06-10 MED ORDER — FAMOTIDINE 40 MG PO TABS: 40.0000 mg | ORAL_TABLET | Freq: Every evening | ORAL | 1 refills | Status: AC

## 2023-06-10 MED ORDER — LEVOCETIRIZINE DIHYDROCHLORIDE 5 MG PO TABS: 5.0000 mg | ORAL_TABLET | Freq: Two times a day (BID) | ORAL | 1 refills | Status: AC | PRN

## 2023-06-10 MED ORDER — IPRATROPIUM BROMIDE 0.03 % NA SOLN: 2.0000 | Freq: Two times a day (BID) | NASAL | 1 refills | Status: AC

## 2023-06-10 MED ORDER — ALBUTEROL SULFATE HFA 108 (90 BASE) MCG/ACT IN AERS: 2.0000 | INHALATION_SPRAY | RESPIRATORY_TRACT | 1 refills | Status: AC | PRN

## 2023-06-10 MED ORDER — OMEPRAZOLE 40 MG PO CPDR: 40.0000 mg | DELAYED_RELEASE_CAPSULE | Freq: Every morning | ORAL | 1 refills | Status: AC

## 2023-06-10 MED ORDER — FLUTICASONE PROPIONATE 50 MCG/ACT NA SUSP: 1.0000 | Freq: Every day | NASAL | 1 refills | Status: AC | PRN

## 2023-06-10 MED ORDER — OLOPATADINE HCL 0.6 % NA SOLN: 1.0000 | Freq: Two times a day (BID) | NASAL | 1 refills | Status: AC

## 2023-06-10 NOTE — Patient Instructions (Signed)
  1.  Continue to Treat and prevent inflammation:   A.  Flonase  1 spray each nostril 2 times per day  B.  montelukast  10 mg tablet 1 time per day  C.  Anoro - 1 inhalation 1 time per day  2.  Continue to Treat and prevent reflux:   A.  Omeprazole  40 mg tablet in a.m.  B.  Famotidine  40 mg mg tablet in p.m.  3.  If needed:   A.  OTC antihistamine. Mucinex , Sudafed  B.  Nasal saline  C.  Albuterol  HFA - 2 inhalations every 4-6 hours  D.  Patanase  - 1 spray each nostril 2 times per day  E.  Ipratropium 0.03% -1-2 sprays each nostril 1-2 times a day   4. Return to clinic in 6 months or earlier if problem.    5. Influenza = Tamiflu . Covid = Paxlovid

## 2023-06-10 NOTE — Progress Notes (Unsigned)
 [facility] [place] [facility] [place] -    Follow-up Note  Referring Provider: Jeannine Larson., MD Primary Provider: Jeannine Larson., MD Date of Office Visit: 06/10/2023  Subjective:   Pamela Larson (DOB: 1945-02-19) is a 78 y.o. 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  I last saw her in this clinic 20 August 2022.  She has had 2 flareups of her respiratory tract disease most likely secondary to viral respiratory tract infections during this winter for which she received an antibiotic and a systemic steroid.  Otherwise, she thinks that she is doing very well and she can exert herself without any problem and have cold air exposure without any problem and rarely uses a short acting bronchodilator and has had very little issues with her upper airway or facial pain while she continues on Flonase , Patanase , montelukast , and Anoro.  And she believes that her reflux is under very good control on her combination of omeprazole  and famotidine .  Allergies as of 06/10/2023       Reactions   Codeine Nausea And Vomiting   Amoxicillin Nausea And Vomiting, Rash   Has patient had a PCN reaction causing immediate rash, facial/tongue/throat swelling, SOB or lightheadedness with hypotension: #  #  #  YES  #  #  #  Has patient had a PCN reaction causing severe rash involving mucus membranes or skin necrosis: No Has patient had a PCN reaction that required hospitalization No Has patient had a PCN reaction occurring within the last 10 years: #  #  #  YES  #  #  #  If all of the above answers are "NO", then may proceed with Cephalosporin use.   Augmentin [amoxicillin-pot Clavulanate] Nausea And Vomiting   Meperidine Hcl Nausea Only   Penicillins Rash   Sulfa Antibiotics Nausea And  Vomiting, Rash   Sulfamethoxazole-trimethoprim Swelling   "jittery"        Medication List    albuterol  108 (90 Base) MCG/ACT inhaler Commonly known as: VENTOLIN  HFA Inhale 2 puffs into the lungs every 4 (four) hours as needed for wheezing or shortness of breath. For shortness of breath   ALPRAZolam  0.25 MG tablet Commonly known as: XANAX  Take 0.125 mg by mouth daily as needed for anxiety.   Anoro Ellipta  62.5-25 MCG/ACT Aepb Generic drug: umeclidinium-vilanterol INHALE 1 PUFF INTO THE LUNGS DAILY   calcium  carbonate 1250 (500 Ca) MG tablet Commonly known as: OS-CAL - dosed in mg of elemental calcium  Take 1 tablet by mouth daily.   cyclobenzaprine  5 MG tablet Commonly known as: FLEXERIL  Take 1 tablet (5 mg total) by mouth 3 (three) times daily as needed for muscle spasms.   dextromethorphan -guaiFENesin  30-600 MG 12hr tablet Commonly known as: MUCINEX  DM Take 1 tablet by mouth 2 (two) times daily.   famotidine  40 MG tablet Commonly known as: PEPCID  TAKE 1 TABLET(40 MG) BY MOUTH AT BEDTIME   FLUoxetine  20 MG tablet Commonly known as: PROZAC  Take 40 mg by mouth daily.   fluticasone  50 MCG/ACT nasal spray Commonly known as: FLONASE  Place 1 spray into both nostrils daily as needed for allergies or rhinitis.   Forteo 600 MCG/2.4ML Sopn Generic drug: Teriparatide Inject 20 mcg into the skin.   ipratropium 0.03 % nasal spray Commonly known as: ATROVENT  Place  2 sprays into both nostrils 2 (two) times daily.   ipratropium 0.06 % nasal spray Commonly known as: ATROVENT  Place 2 sprays into both nostrils 2 (two) times daily as needed (drainage).   levocetirizine 5 MG tablet Commonly known as: XYZAL  Take 1 tablet (5 mg total) by mouth 2 (two) times daily as needed (Can take an extra dose during flare ups.).   levothyroxine  75 MCG tablet Commonly known as: SYNTHROID  Take 75 mcg by mouth daily before breakfast.   montelukast  10 MG tablet Commonly known as:  SINGULAIR  Take 1 tablet (10 mg total) by mouth at bedtime.   mupirocin ointment 2 % Commonly known as: BACTROBAN Apply topically daily.   nystatin  100000 UNIT/ML suspension Commonly known as: MYCOSTATIN  Use as directed 5 mLs in the mouth or throat 4 (four) times daily.   Olopatadine  HCl 0.6 % Soln Place 1 spray into the nose in the morning and at bedtime.   omeprazole  40 MG capsule Commonly known as: PRILOSEC Take 1 capsule (40 mg total) by mouth every morning.   predniSONE  10 MG (21) Tbpk tablet Commonly known as: STERAPRED UNI-PAK 21 TAB As instructed (6,5,4,3,2,1)   rosuvastatin  10 MG tablet Commonly known as: CRESTOR  Take 10 mg by mouth every morning.   VITAMIN B-12 IJ Inject as directed every 30 (thirty) days.   Vitamin D  (Ergocalciferol ) 1.25 MG (50000 UNIT) Caps capsule Commonly known as: DRISDOL  Take 50,000 Units by mouth every 7 (seven) days. 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    Osteoporosis    Pneumonia    hx of    Rheumatoid arthritis (HCC)     Past Surgical History:  Procedure Laterality Date   APPENDECTOMY  1978   bladder hydrodistention  1998   BREAST SURGERY     reduction    CERVICAL DISC ARTHROPLASTY N/A 04/03/2016   Procedure: CERVICAL SIX CERVICAL SEVEN DISC ARTHROPLASTY;  Surgeon: Yvonna Herder, MD;  Location: [facility] OR;  Service: Neurosurgery;  Laterality: N/A;  left side approach    COSMETIC SURGERY  2005   face lift   EXCISION MORTON'S NEUROMA Left 05/08/2017   Procedure: EXCISION MORTON'S NEUROMA Left 3rd webspace;  Surgeon: Amada Backer, MD;  Location: [facility];  Service: Orthopedics;  Laterality: Left;   EYE SURGERY     lower eye lids   LUMBAR LAMINECTOMY/DECOMPRESSION MICRODISCECTOMY  06/10/2011   Procedure: LUMBAR LAMINECTOMY/DECOMPRESSION MICRODISCECTOMY 1 LEVEL;  Surgeon: Corrina Dimitri, MD;  Location: [facility];  Service: Neurosurgery;  Laterality: Left;  Lumbar Five Sacral One Lumbar Laminectomy Decompression Microdiscectomy    REVERSE SHOULDER ARTHROPLASTY Right 09/14/2021   Procedure: REVERSE SHOULDER ARTHROPLASTY;  Surgeon: Winston Hawking, MD;  Location: [facility] OR[facility];  Service: Orthopedics;  Laterality: Right;  Review of systems negative except as noted in HPI / PMHx or noted below:  Review of Systems  Constitutional: Negative.   HENT: Negative.    Eyes: Negative.   Respiratory: Negative.    Cardiovascular: Negative.   Gastrointestinal: Negative.   Genitourinary: Negative.   Musculoskeletal: Negative.   Skin: Negative.   Neurological: Negative.   Endo/Heme/Allergies: Negative.   Psychiatric/Behavioral: Negative.       Objective:   Vitals:   06/10/23 1134  BP: 126/76  Pulse: 82  Resp: 18  Temp: 98.7 F (37.1 C)  SpO2: 96%   Height: 5' (152.4 cm)  Weight: 101 lb 6.4 oz (46 kg)   Physical Exam Constitutional:      Appearance: She is not diaphoretic.  HENT:     Head: Normocephalic.     Right Ear: Tympanic membrane, ear canal and external ear normal.     Left Ear: Tympanic membrane, ear canal and external ear normal.     Nose: Nose normal. No mucosal edema or rhinorrhea.     Mouth/Throat:     Pharynx: Uvula midline. No oropharyngeal exudate.  Eyes:     Conjunctiva/sclera: Conjunctivae normal.  Neck:     Thyroid : No thyromegaly.     Trachea: Trachea normal. No tracheal tenderness or tracheal deviation.   Cardiovascular:     Rate and Rhythm: Normal rate and regular rhythm.     Heart sounds: Normal heart sounds, S1 normal and S2 normal. No murmur heard. Pulmonary:     Effort: No respiratory distress.     Breath sounds: Normal breath sounds. No stridor. No wheezing or rales.  Lymphadenopathy:     Head:     Right side of head: No tonsillar adenopathy.     Left side of head: No tonsillar adenopathy.     Cervical: No cervical adenopathy.  Skin:    Findings: No erythema or rash.     Nails: There is no clubbing.  Neurological:     Mental Status: She is alert.     Diagnostics: Spirometry was performed and demonstrated an FEV1 of 1.81 at 102 % of predicted.   Assessment and Plan:   1. COPD with asthma (HCC)   2. Perennial allergic rhinitis   3. LPRD (laryngopharyngeal reflux disease)   4. Facial pain syndrome    1.  Continue to Treat and prevent inflammation:   A.  Flonase  1 spray each nostril 2 times per day  B.  montelukast  10 mg tablet 1 time per day  C.  Anoro - 1 inhalation 1 time per day  2.  Continue to Treat and prevent reflux:   A.  Omeprazole  40 mg tablet in a.m.  B.  Famotidine  40 mg mg tablet in p.m.  3.  If needed:   A.  OTC antihistamine. Mucinex , Sudafed  B.  Nasal saline  C.  Albuterol  HFA - 2 inhalations every 4-6 hours  D.  Patanase  - 1 spray each nostril 2 times per day  E.  Ipratropium 0.03% -1-2 sprays each nostril 1-2 times a day   4. Return to clinic in 6 months or earlier if problem.    5. Influenza = Tamiflu . Covid = Paxlovid  Overall Aquisha appears to be doing very well on her current therapy of utilizing anti-inflammatory agents for her airway and therapy directed against reflux and she will continue on this plan and we will see her back in this clinic in 6 months or earlier if there is a problem.  Schuyler Custard, MD Allergy / Immunology [facility]

## 2023-06-11 ENCOUNTER — Encounter: Payer: Self-pay | Admitting: Allergy and Immunology

## 2023-06-23 DIAGNOSIS — Z961 Presence of intraocular lens: Secondary | ICD-10-CM | POA: Diagnosis not present

## 2023-06-23 DIAGNOSIS — H52203 Unspecified astigmatism, bilateral: Secondary | ICD-10-CM | POA: Diagnosis not present

## 2023-06-23 DIAGNOSIS — H04123 Dry eye syndrome of bilateral lacrimal glands: Secondary | ICD-10-CM | POA: Diagnosis not present

## 2023-07-17 ENCOUNTER — Ambulatory Visit (HOSPITAL_BASED_OUTPATIENT_CLINIC_OR_DEPARTMENT_OTHER): Admitting: Adult Health

## 2023-07-21 DIAGNOSIS — N390 Urinary tract infection, site not specified: Secondary | ICD-10-CM | POA: Diagnosis not present

## 2023-07-21 DIAGNOSIS — N3010 Interstitial cystitis (chronic) without hematuria: Secondary | ICD-10-CM | POA: Diagnosis not present

## 2023-07-30 DIAGNOSIS — D510 Vitamin B12 deficiency anemia due to intrinsic factor deficiency: Secondary | ICD-10-CM | POA: Diagnosis not present

## 2023-08-10 ENCOUNTER — Other Ambulatory Visit: Payer: Self-pay | Admitting: Allergy and Immunology

## 2023-08-11 DIAGNOSIS — R3915 Urgency of urination: Secondary | ICD-10-CM | POA: Diagnosis not present

## 2023-08-11 DIAGNOSIS — R300 Dysuria: Secondary | ICD-10-CM | POA: Diagnosis not present

## 2023-08-11 DIAGNOSIS — R350 Frequency of micturition: Secondary | ICD-10-CM | POA: Diagnosis not present

## 2023-08-25 ENCOUNTER — Emergency Department (HOSPITAL_BASED_OUTPATIENT_CLINIC_OR_DEPARTMENT_OTHER): Admission: EM | Admit: 2023-08-25 | Discharge: 2023-08-25 | Disposition: A | Discharge status: Home / Self Care

## 2023-08-25 ENCOUNTER — Emergency Department (HOSPITAL_BASED_OUTPATIENT_CLINIC_OR_DEPARTMENT_OTHER): Admitting: Radiology

## 2023-08-25 ENCOUNTER — Encounter (HOSPITAL_BASED_OUTPATIENT_CLINIC_OR_DEPARTMENT_OTHER): Payer: Self-pay

## 2023-08-25 ENCOUNTER — Other Ambulatory Visit: Payer: Self-pay

## 2023-08-25 DIAGNOSIS — Z96611 Presence of right artificial shoulder joint: Secondary | ICD-10-CM | POA: Diagnosis not present

## 2023-08-25 DIAGNOSIS — S4991XA Unspecified injury of right shoulder and upper arm, initial encounter: Secondary | ICD-10-CM | POA: Diagnosis not present

## 2023-08-25 DIAGNOSIS — M542 Cervicalgia: Secondary | ICD-10-CM | POA: Diagnosis present

## 2023-08-25 DIAGNOSIS — M5412 Radiculopathy, cervical region: Secondary | ICD-10-CM | POA: Insufficient documentation

## 2023-08-25 MED ORDER — PREDNISONE 10 MG (21) PO TBPK: ORAL_TABLET | Freq: Every day | ORAL | 0 refills | Status: DC

## 2023-08-25 NOTE — ED Provider Notes (Signed)
 [place] EMERGENCY DEPARTMENT AT [facility] Provider Note   CSN: [id number] Arrival date & time: 08/25/23  1702     Patient presents with: Shoulder Pain   Pamela Larson is a 78 y.o. female with past medical history significant for arthritis, fibromyalgia, history of right shoulder fracture, history of cervical radiculopathy who presents with concern for acute on chronic right shoulder pain, right neck pain with some tingling radiating down right upper extremity.  She denies any new fall or injury, reports that she thinks that she aggravated it doing some yard work a few days ago.  Requesting a course of some steroids until she can get into see her neurosurgeon.  She is already taking ibuprofen, muscle relaxant, Ultram  at home.    Shoulder Pain      Prior to Admission medications   Medication Sig Start Date End Date Taking? Authorizing Provider  predniSONE  (STERAPRED UNI-PAK 21 TAB) 10 MG (21) TBPK tablet Take by mouth daily. Take 6 tabs by mouth daily  for 2 days, then 5 tabs for 2 days, then 4 tabs for 2 days, then 3 tabs for 2 days, 2 tabs for 2 days, then 1 tab by mouth daily for 2 days 08/25/23  Yes Riyad Keena H, PA-C  albuterol  (VENTOLIN  HFA) 108 (90 Base) MCG/ACT inhaler Inhale 2 puffs into the lungs every 4 (four) hours as needed for wheezing or shortness of breath. For shortness of breath 06/10/23   Kozlow, Camellia PARAS, MD  ALPRAZolam  (XANAX ) 0.25 MG tablet Take 0.125 mg by mouth daily as needed for anxiety.    [provider]  azelastine  (ASTELIN ) 0.1 % nasal spray Place 2 sprays into both nostrils 2 (two) times daily. Use in each nostril as directed 04/21/23 05/21/23  Karis Clunes, MD  calcium  carbonate (OS-CAL - DOSED IN MG OF ELEMENTAL CALCIUM ) 1250 MG tablet Take 1 tablet by mouth daily.    [provider]  Cyanocobalamin  (VITAMIN B-12 IJ) Inject as directed every 30 (thirty) days.    [provider]  cyclobenzaprine  (FLEXERIL ) 5 MG tablet  Take 1 tablet (5 mg total) by mouth 3 (three) times daily as needed for muscle spasms. 09/30/22   Jerrol Agent, MD  dextromethorphan -guaiFENesin  (MUCINEX  DM) 30-600 MG 12hr tablet Take 1 tablet by mouth 2 (two) times daily.    [provider]  famotidine  (PEPCID ) 40 MG tablet Take 1 tablet (40 mg total) by mouth at bedtime. TAKE 1 TABLET(40 MG) BY MOUTH AT BEDTIME 06/10/23   Kozlow, Camellia PARAS, MD  FLUoxetine  (PROZAC ) 20 MG tablet Take 40 mg by mouth daily.    [provider]  fluticasone  (FLONASE ) 50 MCG/ACT nasal spray Place 1 spray into both nostrils daily as needed for allergies or rhinitis. 06/10/23   Kozlow, Eric J, MD  ipratropium (ATROVENT ) 0.03 % nasal spray Place 2 sprays into both nostrils 2 (two) times daily. 06/10/23   Kozlow, Eric J, MD  ipratropium (ATROVENT ) 0.06 % nasal spray Place 2 sprays into both nostrils 2 (two) times daily as needed (drainage). 04/21/23 05/21/23  Karis Clunes, MD  levocetirizine (XYZAL ) 5 MG tablet Take 1 tablet (5 mg total) by mouth 2 (two) times daily as needed (Can take an extra dose during flare ups.). 06/10/23   Kozlow, Camellia PARAS, MD  levothyroxine  (SYNTHROID ) 75 MCG tablet Take 75 mcg by mouth daily before breakfast.    [provider]  montelukast  (SINGULAIR ) 10 MG tablet Take 1 tablet (10 mg total) by mouth at bedtime.  06/10/23   Kozlow, Eric J, MD  mupirocin ointment (BACTROBAN) 2 % Apply topically daily. 10/23/22   [provider]  nystatin  (MYCOSTATIN ) 100000 UNIT/ML suspension Use as directed 5 mLs in the mouth or throat 4 (four) times daily. 05/08/22   [provider]  Olopatadine  HCl 0.6 % SOLN Place 1 spray into the nose in the morning and at bedtime. 06/10/23   Kozlow, Camellia PARAS, MD  omeprazole  (PRILOSEC) 40 MG capsule Take 1 capsule (40 mg total) by mouth every morning. 06/10/23   Kozlow, Camellia PARAS, MD  rosuvastatin  (CRESTOR ) 10 MG tablet Take 10 mg by mouth every morning. 08/02/18   [provider]  Teriparatide,  Recombinant, (FORTEO) 600 MCG/2.4ML SOPN Inject 20 mcg into the skin. 04/09/11   [provider]  umeclidinium-vilanterol (ANORO ELLIPTA ) 62.5-25 MCG/ACT AEPB INHALE 1 PUFF INTO THE LUNGS DAILY 08/11/23   Cheryl Reusing, FNP  Vitamin D , Ergocalciferol , (DRISDOL ) 50000 UNITS CAPS Take 50,000 Units by mouth every 7 (seven) days. On Mondays    [provider]    Allergies: Codeine, Amoxicillin, Augmentin [amoxicillin-pot clavulanate], Meperidine hcl, Penicillins, Sulfa antibiotics, and Sulfamethoxazole-trimethoprim    Review of Systems  All other systems reviewed and are negative.   Updated Vital Signs BP (!) 157/74   Pulse 68   Temp 97.9 F (36.6 C)   Resp 15   Ht 5' (1.524 m)   Wt 46.3 kg   SpO2 99%   BMI 19.92 kg/m   Physical Exam Vitals and nursing note reviewed.  Constitutional:      General: She is not in acute distress.    Appearance: Normal appearance.  HENT:     Head: Normocephalic and atraumatic.  Eyes:     General:        Right eye: No discharge.        Left eye: No discharge.  Cardiovascular:     Rate and Rhythm: Normal rate and regular rhythm.     Pulses: Normal pulses.  Pulmonary:     Effort: Pulmonary effort is normal. No respiratory distress.  Musculoskeletal:        General: No deformity.     Comments: Some focal tenderness to palpation of the right shoulder, right cervical paraspinous muscles, intact strength to flexion, extension, abduction, adduction at the shoulder with some pain.  Normal grip strength bilaterally.  Skin:    General: Skin is warm and dry.     Capillary Refill: Capillary refill takes less than 2 seconds.  Neurological:     Mental Status: She is alert and oriented to person, place, and time.  Psychiatric:        Mood and Affect: Mood normal.        Behavior: Behavior normal.     (all labs ordered are listed, but only abnormal results are displayed) Labs Reviewed - No data to display  EKG: None  Radiology: DG  Shoulder Right Result Date: 08/25/2023 CLINICAL DATA:  Injury EXAM: RIGHT SHOULDER - 2+ VIEW COMPARISON:  Right shoulder x-ray 02/13/2023 FINDINGS: Right shoulder arthroplasty appears in anatomic alignment. No hardware loosening or acute fracture identified soft tissues are within normal limits. IMPRESSION: Right shoulder arthroplasty appears in anatomic alignment. Electronically Signed   By: Greig Pique M.D.   On: 08/25/2023 17:53     Procedures   Medications Ordered in the ED - No data to display  Medical Decision Making Amount and/or Complexity of Data Reviewed Radiology: ordered.   This is an overall well-appearing 78 year old female who presents with concern for acute on chronic right shoulder, arm, right sided neck pain.  History of right shoulder arthroplasty, right cervical radiculopathy.  She does follow with a neurosurgeon, was supposed to have a facet injection but has been unable to follow-up with them until a few weeks from now.  Reports new aggravation from doing some yard work.  No fall.  I independently interpreted plain film radiograph of the right shoulder which shows stable right arthroplasty, no periprosthetic fracture.  She is neurovascularly intact on my exam with some pain around the rotator cuff, cervical paraspinous muscles.  She endorses some tingling down the right arm.  She has normal strength throughout however.  I do think that findings are consistent with acute on chronic cervical radiculopathy.  Discussed with patient about risks of multiple rounds of steroids, but given that she is otherwise taking anti-inflammatories, muscle relaxants, and Ultram , I think reasonable for additional course of steroid at this time with knowledge that in the future she needs to follow-up with her orthopedic physician or neurosurgeon for this type of problem.  Patient understands risks of taking steroids, knows to monitor her blood pressure, blood sugar,  and has close follow-up scheduled.  She is discharged in stable condition at this time.  All questions answered.  Final diagnoses:  Cervical radiculopathy    ED Discharge Orders          Ordered    predniSONE  (STERAPRED UNI-PAK 21 TAB) 10 MG (21) TBPK tablet  Daily        08/25/23 1909               Fronia Depass H, PA-C 08/25/23 1915    Neysa Caron PARAS, DO 08/25/23 2344

## 2023-08-25 NOTE — ED Notes (Signed)
 Patient reports discomfort in shoulder after steroid taper. Does not have an appointment for two weeks. She reports she has a vacation next week and would like to be pain free for it. She reports not wanting to take opioids for it, but does report she took a tramadol  to sleep.

## 2023-08-25 NOTE — ED Triage Notes (Signed)
 Pt presents c/o R shoulder and arm pain ongoing x2 weeks. Hx broken R shoulder x2 years ago, pain began NYE 2024. Seen and treated in [place], pain resolved March 2025. Reports pain began x2 weeks ago again. Pt did yard work ~a few days ago and pain is ongoing and worsening. Reports took Ultram  and 2.5 mg cyclobenzaprine  this morning and 5mg  cyclobenzaprine  at 1530 today.

## 2023-08-25 NOTE — Discharge Instructions (Addendum)
 Continue to use your home ibuprofen, Ultram , muscle relaxants, take the entire course of steroids that are prescribed, please follow-up closely with your neurosurgeon.

## 2023-09-08 ENCOUNTER — Encounter (HOSPITAL_BASED_OUTPATIENT_CLINIC_OR_DEPARTMENT_OTHER): Payer: Self-pay | Admitting: Adult Health

## 2023-09-08 ENCOUNTER — Ambulatory Visit (HOSPITAL_BASED_OUTPATIENT_CLINIC_OR_DEPARTMENT_OTHER): Admitting: Adult Health

## 2023-09-08 VITALS — BP 120/81 | HR 93 | Ht 62.0 in | Wt 102.0 lb

## 2023-09-08 DIAGNOSIS — K219 Gastro-esophageal reflux disease without esophagitis: Secondary | ICD-10-CM

## 2023-09-08 DIAGNOSIS — J310 Chronic rhinitis: Secondary | ICD-10-CM | POA: Diagnosis not present

## 2023-09-08 DIAGNOSIS — J431 Panlobular emphysema: Secondary | ICD-10-CM | POA: Diagnosis not present

## 2023-09-08 MED ORDER — UMECLIDINIUM-VILANTEROL 62.5-25 MCG/ACT IN AEPB: 1.0000 | INHALATION_SPRAY | Freq: Every day | RESPIRATORY_TRACT | 11 refills | Status: DC

## 2023-09-08 NOTE — Progress Notes (Signed)
 @Patient  ID: Pamela Larson, female    DOB: 01-27-1946, 78 y.o.   MRN: [id number]  Chief Complaint  Patient presents with   Follow-up    Referring provider: Loreli Elsie JONETTA Mickey., MD  HPI: 78 yo female former smoker followed for COPD with emphysema and allergic rhinitis   TEST/EVENTS :  CT Chest 08/27/17 - Mild paraseptal emphysema. CXR 06/28/21 - No acute infiltrate, effusion or edema CT Chest A/P 07/17/21 - Minimal apical emphysema. Azygous fissure.   PFT: 10/16/17 FVC 2.55 (101%) FEV1 2.10 (110%) Ratio 80  TLC 99% DLCO 84% Interpretation: Normal PFTs   Spirometry 12/26/20 FVC 1.94 (85%) FEV1 (87%) Ratio 79%   Spirometry 02/12/22 FVC 1.65 (73%) FEV1 1.32 (78%) Ratio 80 Interpretation: Normal spirometry  09/08/2023 Follow up ; COPD/Emphysema and Allergic rhinitis  Discussed the use of AI scribe software for clinical note transcription with the patient, who gave verbal consent to proceed.  History of Present Illness Pamela Larson is a 78 year old female with COPD emphysema who presents for her annual checkup.  She has been managing her COPD emphysema well over the past year, experiencing only a couple of minor episodes that resolved on there own.  She is currently on Anoro, taken once daily, and has not needed to use her rescue inhaler, albuterol , in the past year.  She manages chronic allergies with  and Xyzal  and Singulair  as needed. She takes flonase  as needed.   Her GERD is managed with Prilosec daily. Feels well controlled.    She maintains an active lifestyle, practices good hand hygiene, and wears a mask in public places to mitigate her risk of infections.     Allergies  Allergen Reactions   Codeine Nausea And Vomiting   Amoxicillin Nausea And Vomiting and Rash    Has patient had a PCN reaction causing immediate rash, facial/tongue/throat swelling, SOB or lightheadedness with hypotension: #  #  #  YES  #  #  #   Has patient had a PCN reaction causing severe  rash involving mucus membranes or skin necrosis: No  Has patient had a PCN reaction that required hospitalization No  Has patient had a PCN reaction occurring within the last 10 years: #  #  #  YES  #  #  #   If all of the above answers are NO, then may proceed with Cephalosporin use.   Augmentin [Amoxicillin-Pot Clavulanate] Nausea And Vomiting   Meperidine Hcl Nausea Only   Penicillins Rash   Sulfa Antibiotics Nausea And Vomiting and Rash   Sulfamethoxazole-Trimethoprim Swelling    jittery    Immunization History  Administered Date(s) Administered   Fluad Quad(high Dose 65+) 10/31/2021   Fluzone Influenza virus vaccine,trivalent (IIV3), split virus 12/07/2010, 11/20/2011, 12/25/2012   Influenza Inj Mdck Quad Pf 12/26/2015   Influenza Split 05/26/2008, 06/05/2009, 11/16/2009, 01/01/2014   Influenza, High Dose Seasonal PF 01/01/2014, 11/17/2014, 12/07/2016, 11/19/2017, 12/05/2018   Influenza-Unspecified 12/07/2016, 11/16/2018   PFIZER(Purple Top)SARS-COV-2 Vaccination 03/19/2019, 04/13/2019, 10/12/2019   Pneumococcal Conjugate-13 07/07/2013   Pneumococcal Polysaccharide-23 05/26/2008, 06/05/2009, 06/29/2012, 10/22/2017   Td 05/26/2008, 06/05/2009   Td (Adult), 2 Lf Tetanus Toxid, Preservative Free 05/26/2008, 06/05/2009   Tdap 05/19/2007, 11/01/2016, 01/03/2022   Zoster Recombinant(Shingrix) 12/26/2016, 06/12/2017   Zoster, Live 05/26/2008, 06/05/2009, 06/08/2010, 11/17/2014, 12/26/2016    Past Medical History:  Diagnosis Date   Anemia    pernicious anemia - 1991    Anxiety    Arthritis    rheumatoid arthritis  Asthma    Chronic fatigue    Chronic kidney disease    COPD (chronic obstructive pulmonary disease) (HCC)    Cystitis, interstitial    Depression    Deviated septum    Disc disorder of lumbar region 2013   Fibromyalgia    GERD (gastroesophageal reflux disease)    History of kidney stones    Hypothyroidism    Interstitial cystitis    Lyme disease     hx of possible    Neuromuscular disorder (HCC)    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     azelastine  (ASTELIN ) 0.1 % nasal spray Place 2 sprays into both nostrils 2 (two) times daily. Use in each nostril as directed 30 mL 8   calcium  carbonate (OS-CAL - DOSED IN MG OF ELEMENTAL CALCIUM ) 1250 MG tablet Take 1 tablet by mouth daily.     Cyanocobalamin  (VITAMIN B-12 IJ) Inject as directed every 30 (thirty) days.     cyclobenzaprine  (FLEXERIL ) 5 MG tablet Take 1 tablet (5 mg total) by mouth 3 (three) times daily as needed for muscle spasms. 30 tablet 0   dextromethorphan -guaiFENesin  (MUCINEX  DM) 30-600 MG 12hr tablet Take 1 tablet by mouth 2 (two) times daily.     famotidine  (PEPCID ) 40 MG tablet Take 1 tablet (40 mg total) by mouth at bedtime. TAKE 1 TABLET(40 MG) BY MOUTH AT BEDTIME 90 tablet 1   FLUoxetine  (PROZAC ) 20 MG tablet Take 40 mg by mouth daily.     fluticasone  (FLONASE ) 50 MCG/ACT nasal spray Place 1 spray into both nostrils daily as needed for allergies or rhinitis. 48 g 1   ipratropium (ATROVENT ) 0.03 % nasal spray Place 2 sprays into both nostrils 2 (two) times daily. 90 mL 1   ipratropium (ATROVENT ) 0.06 % nasal spray Place 2 sprays into both nostrils 2 (two) times daily as needed (drainage). 15 mL 12   levocetirizine (XYZAL ) 5 MG tablet Take 1 tablet (5 mg total) by mouth 2 (two) times daily as needed (Can take an  extra dose during flare ups.). 180 tablet 1   levothyroxine  (SYNTHROID ) 75 MCG tablet Take 75 mcg by mouth daily before breakfast.     montelukast  (SINGULAIR ) 10 MG tablet Take 1 tablet (10 mg total) by mouth at bedtime. 90 tablet 1   nystatin  (MYCOSTATIN ) 100000 UNIT/ML suspension Use as directed 5 mLs in the mouth or throat 4 (four) times daily.     Olopatadine  HCl 0.6 % SOLN Place 1 spray into the nose in the morning and at bedtime. 91.5 g 1   omeprazole  (PRILOSEC) 40 MG capsule Take 1 capsule (40 mg total) by mouth every morning. 90 capsule 1   rosuvastatin  (CRESTOR ) 10 MG tablet Take 10 mg by mouth every morning.     Teriparatide, Recombinant, (FORTEO) 600 MCG/2.4ML SOPN Inject 20 mcg into  the skin.     umeclidinium-vilanterol (ANORO ELLIPTA ) 62.5-25 MCG/ACT AEPB INHALE 1 PUFF INTO THE LUNGS DAILY 60 each 1   Vitamin D , Ergocalciferol , (DRISDOL ) 50000 UNITS CAPS Take 50,000 Units by mouth every 7 (seven) days. On Mondays     albuterol  (VENTOLIN  HFA) 108 (90 Base) MCG/ACT inhaler Inhale 2 puffs into the lungs every 4 (four) hours as needed for wheezing or shortness of breath. For shortness of breath (Patient not taking: Reported on 09/08/2023) 18 g 1   mupirocin ointment (BACTROBAN) 2 % Apply topically daily. (Patient not taking: Reported on 09/08/2023)     predniSONE  (STERAPRED UNI-PAK 21 TAB) 10 MG (21) TBPK tablet Take by mouth daily. Take 6 tabs by mouth daily  for 2 days, then 5 tabs for 2 days, then 4 tabs for 2 days, then 3 tabs for 2 days, 2 tabs for 2 days, then 1 tab by mouth daily for 2 days (Patient not taking: Reported on 09/08/2023) 42 tablet 0   No facility-administered medications prior to visit.     Review of Systems:   Constitutional:   No  weight loss, night sweats,  Fevers, chills, fatigue, or  lassitude.  HEENT:   No headaches,  Difficulty swallowing,  Tooth/dental problems, or  Sore throat,                No sneezing, itching, ear ache, +nasal congestion, post nasal  drip,   CV:  No chest pain,  Orthopnea, PND, swelling in lower extremities, anasarca, dizziness, palpitations, syncope.   GI  No heartburn, indigestion, abdominal pain, nausea, vomiting, diarrhea, change in bowel habits, loss of appetite, bloody stools.   Resp:   No wheezing.  No chest wall deformity  Skin: no rash or lesions.  GU: no dysuria, change in color of urine, no urgency or frequency.  No flank pain, no hematuria   MS:  No joint pain or swelling.  No decreased range of motion.  No back pain.    Physical Exam  BP 120/81 (BP Location: Left Arm, Patient Position: Sitting)   Pulse 93   Ht 5' 2 (1.575 m)   Wt 102 lb (46.3 kg)   SpO2 93%   BMI 18.66 kg/m   GEN: A/Ox3; pleasant , NAD, well nourished    HEENT:  [place]/AT, NOSE-clear, THROAT-clear, no lesions, no postnasal drip or exudate noted.   NECK:  Supple w/ fair ROM; no JVD; normal carotid impulses w/o bruits; no thyromegaly or nodules palpated; no lymphadenopathy.    RESP  Clear  P & A; w/o, wheezes/ rales/ or rhonchi. no accessory muscle use, no dullness to percussion  CARD:  RRR, no m/r/g, no peripheral edema, pulses intact, no cyanosis or clubbing.  GI:   Soft & nt; nml bowel sounds; no organomegaly or masses detected.   Musco: Warm bil, no deformities or joint swelling noted.   Neuro: alert, no focal deficits noted.    Skin: Warm, no lesions or rashes    Lab Results:  CBC   BMET   BNP No results found for: BNP  ProBNP No results found for: PROBNP  Imaging:   Administration History     None          Latest Ref Rng & Units 10/16/2017    1:55 PM  PFT Results  FVC-Pre L 2.60   FVC-Predicted Pre % 103   FVC-Post L 2.55   FVC-Predicted Post % 101   Pre FEV1/FVC % % 80   Post  FEV1/FCV % % 82   FEV1-Pre L 2.08   FEV1-Predicted Pre % 110   FEV1-Post L 2.10   DLCO uncorrected ml/min/mmHg 15.92   DLCO UNC% % 84   DLCO corrected ml/min/mmHg 15.30   DLCO COR %Predicted % 81   DLVA  Predicted % 91   TLC L 4.43   TLC % Predicted % 99   RV % Predicted % 85     No results found for: NITRICOXIDE      Assessment & Plan:   No problem-specific Assessment & Plan notes found for this encounter.  Assessment and Plan Assessment & Plan COPD with emphysema   Her COPD with emphysema is well-managed, with no exacerbations or use of albuterol  in the past year. . Use albuterol  as needed and contact if a refill is required. Encourage flu, COVID, and RSV vaccinations  Discuss the pneumonia booster with Doctor Loreli  Chronic allergies   Chronic allergies are managed with daily Flonase , and Xyzal  and Singulair  as needed. No significant issues are reported.   Gastroesophageal reflux disease (GERD)   GERD is managed with daily Prilosec, with no current issues. Continue Prilosec daily.  Plan  Patient Instructions  Continue on ANORO 1 puff daily  Albuterol  inhaler As needed   Continue on Singulair  and Xyzal  daily  Flonase  nasal As needed   Activity as tolerated.  Follow up in 1 year with Dr Kassie and As needed          Madelin Stank, NP 09/08/2023

## 2023-09-08 NOTE — Patient Instructions (Signed)
 Continue on ANORO 1 puff daily  Albuterol  inhaler As needed   Continue on Singulair  and Xyzal  daily  Flonase  nasal As needed   Activity as tolerated.  Follow up in 1 year with Dr Kassie and As needed

## 2023-09-11 DIAGNOSIS — M5412 Radiculopathy, cervical region: Secondary | ICD-10-CM | POA: Diagnosis not present

## 2023-09-30 DIAGNOSIS — M5030 Other cervical disc degeneration, unspecified cervical region: Secondary | ICD-10-CM | POA: Diagnosis not present

## 2023-09-30 DIAGNOSIS — N390 Urinary tract infection, site not specified: Secondary | ICD-10-CM | POA: Diagnosis not present

## 2023-09-30 DIAGNOSIS — M542 Cervicalgia: Secondary | ICD-10-CM | POA: Diagnosis not present

## 2023-09-30 DIAGNOSIS — N958 Other specified menopausal and perimenopausal disorders: Secondary | ICD-10-CM | POA: Diagnosis not present

## 2023-10-01 DIAGNOSIS — M9903 Segmental and somatic dysfunction of lumbar region: Secondary | ICD-10-CM | POA: Diagnosis not present

## 2023-10-01 DIAGNOSIS — M5417 Radiculopathy, lumbosacral region: Secondary | ICD-10-CM | POA: Diagnosis not present

## 2023-10-01 DIAGNOSIS — S39012A Strain of muscle, fascia and tendon of lower back, initial encounter: Secondary | ICD-10-CM | POA: Diagnosis not present

## 2023-10-01 DIAGNOSIS — S29012A Strain of muscle and tendon of back wall of thorax, initial encounter: Secondary | ICD-10-CM | POA: Diagnosis not present

## 2023-10-01 DIAGNOSIS — M9902 Segmental and somatic dysfunction of thoracic region: Secondary | ICD-10-CM | POA: Diagnosis not present

## 2023-10-01 DIAGNOSIS — M9905 Segmental and somatic dysfunction of pelvic region: Secondary | ICD-10-CM | POA: Diagnosis not present

## 2023-10-22 ENCOUNTER — Ambulatory Visit (INDEPENDENT_AMBULATORY_CARE_PROVIDER_SITE_OTHER): Admitting: Otolaryngology

## 2023-10-22 VITALS — BP 134/76 | HR 79

## 2023-10-22 DIAGNOSIS — R0981 Nasal congestion: Secondary | ICD-10-CM | POA: Diagnosis not present

## 2023-10-22 DIAGNOSIS — J310 Chronic rhinitis: Secondary | ICD-10-CM | POA: Diagnosis not present

## 2023-10-22 DIAGNOSIS — J343 Hypertrophy of nasal turbinates: Secondary | ICD-10-CM

## 2023-10-22 MED ORDER — FLUTICASONE PROPIONATE 50 MCG/ACT NA SUSP: 2.0000 | Freq: Every day | NASAL | 10 refills | Status: DC

## 2023-10-22 MED ORDER — IPRATROPIUM BROMIDE 0.06 % NA SOLN: 2.0000 | Freq: Two times a day (BID) | NASAL | 12 refills | Status: DC | PRN

## 2023-10-22 MED ORDER — PREDNISONE 10 MG (21) PO TBPK: ORAL_TABLET | ORAL | 0 refills | Status: DC

## 2023-10-22 MED ORDER — AZITHROMYCIN 250 MG PO TABS: ORAL_TABLET | ORAL | 0 refills | Status: DC

## 2023-10-22 MED ORDER — AZELASTINE HCL 0.1 % NA SOLN: 2.0000 | Freq: Two times a day (BID) | NASAL | 12 refills | Status: AC

## 2023-10-22 NOTE — Progress Notes (Unsigned)
 Patient ID: Pamela Larson, female   DOB: Jun 21, 1945, 78 y.o.   MRN: [id number]  Follow-up: Recurrent sinus infections  HPI: The patient is a 78 year old female who returns today for her follow-up evaluation. The patient was previously seen for recurrent sinus infections.  At her last visit, she was noted to have nasal mucosal congestion and bilateral inferior turbinate hypertrophy.  No acute infection was noted at that time.  The patient was continued on Flonase , azelastine , and Atrovent .  The patient returns today reporting 1 episode of sinusitis in November 2024.  She was successfully treated with antibiotic.  She also noted occasional nasal congestion.  Currently she denies any fever or visual change.  Exam: General: Communicates without difficulty, well nourished, no acute distress. Head: Normocephalic, no evidence injury, no tenderness, facial buttresses intact without stepoff. Face/sinus: No tenderness to palpation and percussion. Facial movement is normal and symmetric. Eyes: PERRL, EOMI. No scleral icterus, conjunctivae clear. Neuro: CN II exam reveals vision grossly intact.  No nystagmus at any point of gaze. Ears: Auricles well formed without lesions.  Ear canals are intact without mass or lesion.  No erythema or edema is appreciated.  The TMs are intact without fluid. Nose: External evaluation reveals normal support and skin without lesions.  Dorsum is intact.  Anterior rhinoscopy reveals congested mucosa over anterior aspect of inferior turbinates and intact septum.  No purulence noted. Oral:  Oral cavity and oropharynx are intact, symmetric, without erythema or edema.  Mucosa is moist without lesions. Neck: Full range of motion without pain.  There is no significant lymphadenopathy.  No masses palpable.  Thyroid  bed within normal limits to palpation.  Parotid glands and submandibular glands equal bilaterally without mass.  Trachea is midline. Neuro:  CN 2-12 grossly intact.   Assessment: 1.  Chronic rhinitis with nasal mucosal congestion and bilateral inferior turbinate hypertrophy. 2. No acute infection is noted today. Her sinus openings are patent.  Plan: 1. The physical exam findings are reviewed with the patient. 2. The patient is reassured and no acute infection is noted today. 3. Continue with Flonase , azelastine , and Atrovent  nasal sprays.  Refills are given to the patient. 4. The patient will return for reevaluation in 6 months, sooner if needed.

## 2023-11-12 DIAGNOSIS — D1801 Hemangioma of skin and subcutaneous tissue: Secondary | ICD-10-CM | POA: Diagnosis not present

## 2023-11-12 DIAGNOSIS — D224 Melanocytic nevi of scalp and neck: Secondary | ICD-10-CM | POA: Diagnosis not present

## 2023-11-12 DIAGNOSIS — L821 Other seborrheic keratosis: Secondary | ICD-10-CM | POA: Diagnosis not present

## 2023-11-12 DIAGNOSIS — D225 Melanocytic nevi of trunk: Secondary | ICD-10-CM | POA: Diagnosis not present

## 2023-11-24 DIAGNOSIS — M7742 Metatarsalgia, left foot: Secondary | ICD-10-CM | POA: Diagnosis not present

## 2023-12-03 DIAGNOSIS — R52 Pain, unspecified: Secondary | ICD-10-CM | POA: Diagnosis not present

## 2023-12-03 DIAGNOSIS — R5383 Other fatigue: Secondary | ICD-10-CM | POA: Diagnosis not present

## 2023-12-03 DIAGNOSIS — J309 Allergic rhinitis, unspecified: Secondary | ICD-10-CM | POA: Diagnosis not present

## 2023-12-03 DIAGNOSIS — R6883 Chills (without fever): Secondary | ICD-10-CM | POA: Diagnosis not present

## 2023-12-03 DIAGNOSIS — J0100 Acute maxillary sinusitis, unspecified: Secondary | ICD-10-CM | POA: Diagnosis not present

## 2023-12-03 DIAGNOSIS — Z1152 Encounter for screening for COVID-19: Secondary | ICD-10-CM | POA: Diagnosis not present

## 2023-12-03 DIAGNOSIS — R051 Acute cough: Secondary | ICD-10-CM | POA: Diagnosis not present

## 2023-12-08 NOTE — Patient Instructions (Incomplete)
  1.  Continue to Treat and prevent inflammation:   A.  Flonase  1 spray each nostril 2 times per day  B.  montelukast  10 mg tablet 1 time per day  C.  Anoro - 1 inhalation 1 time per day  2.  Continue to Treat and prevent reflux:   A.  Omeprazole  40 mg tablet in a.m.  B.  Famotidine  40 mg mg tablet in p.m.  3.  If needed:   A.  OTC antihistamine. Mucinex , Sudafed  B.  Nasal saline  C.  Albuterol  HFA - 2 inhalations every 4-6 hours  D.  Patanase  - 1 spray each nostril 2 times per day  E.  Ipratropium 0.03% -1-2 sprays each nostril 1-2 times a day   4. Return to clinic in 6 months or earlier if problem.    5. Continue to follow up with pulmonology and ear nose and throat

## 2023-12-09 ENCOUNTER — Encounter: Payer: Self-pay | Admitting: Family

## 2023-12-09 ENCOUNTER — Ambulatory Visit (INDEPENDENT_AMBULATORY_CARE_PROVIDER_SITE_OTHER): Admitting: Family

## 2023-12-09 ENCOUNTER — Other Ambulatory Visit: Payer: Self-pay

## 2023-12-09 VITALS — BP 140/80 | HR 74 | Temp 98.1°F

## 2023-12-09 DIAGNOSIS — K219 Gastro-esophageal reflux disease without esophagitis: Secondary | ICD-10-CM | POA: Diagnosis not present

## 2023-12-09 DIAGNOSIS — J4489 Other specified chronic obstructive pulmonary disease: Secondary | ICD-10-CM | POA: Diagnosis not present

## 2023-12-09 DIAGNOSIS — G500 Trigeminal neuralgia: Secondary | ICD-10-CM | POA: Diagnosis not present

## 2023-12-09 DIAGNOSIS — J3089 Other allergic rhinitis: Secondary | ICD-10-CM

## 2023-12-09 NOTE — Progress Notes (Signed)
 [address] [place] [address] Dept: [phone]  FOLLOW UP NOTE  Patient ID: Pamela Larson, female    DOB: 10-Jul-1945  Age: 78 y.o. MRN: [id number] Date of Office Visit: 12/09/2023  Assessment  Chief Complaint: Follow-up and Nasal Congestion (With drainage yellow mucous)  HPI Pamela Larson is a 78 year old female who presents today for follow-up of COPD with asthma, perennial allergic rhinitis, LPRD, and facial pain syndrome.  She was last seen on June 10, 2023 by Dr. Maurilio.  She denies any new diagnosis or surgery since her last office visit.  COPD with asthma is reported as being pretty good.  She reports a little bit of tightness last week but is better today.  Otherwise she denies cough, wheeze, shortness of breath, and nocturnal awakenings due to breathing problems.  Since her last office visit she has not needed to use her rescue inhaler and she has received 2 steroids. 1 for her right shoulder and then the other for a cold.  She does continue to take Anoro 1 puff once a day.  Perennial allergic rhinitis: She reports over the summer she has very little drainage.  She has had more drainage the past 2 weeks.  It is clear in color except mid afternoon she gets a raspy voice and she will cough up little bit of grayish with a little yellow and then it is gone.  She also has a little residual rhinorrhea and nasal congestion from a recent cold.  She reports that her allergies have been better than what they used to be.  She used to get sinus infections continuously.  She does follow with Dr. Karis, her ENT, and he lets her have a steroid and antibiotic on hand should she get an infection.  She does use Flonase  daily and uses ipratropium bromide  and Patanase  nasal spray as needed.  LPRD: She reports that this is under control.  She takes omeprazole  40 mg in the morning and famotidine  40 mg at night.   Drug Allergies:  Allergies  Allergen Reactions   Codeine Nausea And Vomiting    Amoxicillin Nausea And Vomiting and Rash    Has patient had a PCN reaction causing immediate rash, facial/tongue/throat swelling, SOB or lightheadedness with hypotension: #  #  #  YES  #  #  #   Has patient had a PCN reaction causing severe rash involving mucus membranes or skin necrosis: No  Has patient had a PCN reaction that required hospitalization No  Has patient had a PCN reaction occurring within the last 10 years: #  #  #  YES  #  #  #   If all of the above answers are NO, then may proceed with Cephalosporin use.   Augmentin [Amoxicillin-Pot Clavulanate] Nausea And Vomiting   Meperidine Hcl Nausea Only   Penicillins Rash   Sulfa Antibiotics Nausea And Vomiting and Rash   Sulfamethoxazole-Trimethoprim Swelling    jittery    Review of Systems: Negative except as per HPI   Physical Exam: BP (!) 140/80   Pulse 74   Temp 98.1 F (36.7 C)   SpO2 97%    Physical Exam Constitutional:      Appearance: Normal appearance.  HENT:     Head: Normocephalic and atraumatic.     Comments: Pharynx normal, eyes normal, ears normal, nose: Bilateral lower turbinates mildly edematous with no drainage noted    Right Ear: Tympanic membrane, ear canal and external ear normal.  Left Ear: Tympanic membrane, ear canal and external ear normal.     Mouth/Throat:     Mouth: Mucous membranes are moist.     Pharynx: Oropharynx is clear.  Eyes:     Conjunctiva/sclera: Conjunctivae normal.  Cardiovascular:     Rate and Rhythm: Regular rhythm.     Heart sounds: Normal heart sounds.  Pulmonary:     Effort: Pulmonary effort is normal.     Breath sounds: Normal breath sounds.     Comments: Lungs clear to auscultation Musculoskeletal:     Cervical back: Neck supple.  Skin:    General: Skin is warm.  Neurological:     Mental Status: She is alert and oriented to person, place, and time.  Psychiatric:        Mood and Affect: Mood normal.        Behavior: Behavior normal.         Thought Content: Thought content normal.        Judgment: Judgment normal.     Diagnostics: FVC 2.15 L (93%), FEV1 1.82 L (102%), FEV1/FVC 0.85.  Spirometry indicates normal spirometry.  Assessment and Plan: 1. Perennial allergic rhinitis   2. COPD with asthma (HCC)   3. LPRD (laryngopharyngeal reflux disease)   4. Facial pain syndrome     No orders of the defined types were placed in this encounter.   Patient Instructions   1.  Continue to Treat and prevent inflammation:   A.  Flonase  1 spray each nostril 2 times per day  B.  montelukast  10 mg tablet 1 time per day  C.  Anoro - 1 inhalation 1 time per day  2.  Continue to Treat and prevent reflux:   A.  Omeprazole  40 mg tablet in a.m.  B.  Famotidine  40 mg mg tablet in p.m.  3.  If needed:   A.  OTC antihistamine. Mucinex , Sudafed  B.  Nasal saline  C.  Albuterol  HFA - 2 inhalations every 4-6 hours  D.  Patanase  - 1 spray each nostril 2 times per day  E.  Ipratropium 0.03% -1-2 sprays each nostril 1-2 times a day   4. Return to clinic in 6 months or earlier if problem.    5. Continue to follow up with pulmonology and ear nose and throat     Return in about 6 months (around 06/08/2024), or if symptoms worsen or fail to improve.    Thank you for the opportunity to care for this patient.  Please do not hesitate to contact me with questions.  Wanda Craze, FNP [facility]  

## 2023-12-23 ENCOUNTER — Other Ambulatory Visit: Payer: Self-pay | Admitting: Family

## 2023-12-23 DIAGNOSIS — J4489 Other specified chronic obstructive pulmonary disease: Secondary | ICD-10-CM

## 2023-12-26 DIAGNOSIS — Z23 Encounter for immunization: Secondary | ICD-10-CM | POA: Diagnosis not present

## 2023-12-29 ENCOUNTER — Telehealth (INDEPENDENT_AMBULATORY_CARE_PROVIDER_SITE_OTHER): Payer: Self-pay

## 2023-12-29 ENCOUNTER — Telehealth (INDEPENDENT_AMBULATORY_CARE_PROVIDER_SITE_OTHER): Payer: Self-pay | Admitting: Otolaryngology

## 2023-12-29 DIAGNOSIS — M5414 Radiculopathy, thoracic region: Secondary | ICD-10-CM | POA: Diagnosis not present

## 2023-12-29 DIAGNOSIS — M531 Cervicobrachial syndrome: Secondary | ICD-10-CM | POA: Diagnosis not present

## 2023-12-29 DIAGNOSIS — M9901 Segmental and somatic dysfunction of cervical region: Secondary | ICD-10-CM | POA: Diagnosis not present

## 2023-12-29 DIAGNOSIS — M9902 Segmental and somatic dysfunction of thoracic region: Secondary | ICD-10-CM | POA: Diagnosis not present

## 2023-12-29 NOTE — Telephone Encounter (Signed)
 Per Dr.Teoh, a Prednisone  10 mg  dose pack for 6 days and a Z-pack was called in at [facility] on [address].

## 2023-12-29 NOTE — Telephone Encounter (Signed)
 Patient called and LVM wanting to get Prednisone  and Z-pack. Patient did not state why other than that she said Dr. Karis told her he would write a prescription for them. Please advise.

## 2024-02-24 ENCOUNTER — Encounter: Payer: Self-pay | Admitting: Internal Medicine

## 2024-03-03 ENCOUNTER — Ambulatory Visit

## 2024-03-03 VITALS — Ht 62.0 in | Wt 102.0 lb

## 2024-03-03 DIAGNOSIS — Z860100 Personal history of colon polyps, unspecified: Secondary | ICD-10-CM

## 2024-03-03 DIAGNOSIS — Z800 Family history of malignant neoplasm of digestive organs: Secondary | ICD-10-CM

## 2024-03-03 MED ORDER — NA SULFATE-K SULFATE-MG SULF 17.5-3.13-1.6 GM/177ML PO SOLN: 1.0000 | Freq: Once | ORAL | 0 refills | Status: AC

## 2024-03-03 NOTE — Progress Notes (Signed)
 PCP MD at time of PV: Loreli, MD  __________________________________________________________________________________________________________________________________________  No egg allergy known to patient  No soy allergy known to patient No issues known to pt with past sedation with any surgeries or procedures Patient denies ever being told they had issues or difficulty with intubation  No FH of Malignant Hyperthermia Pt is not on diet pills Pt is not on  home 02  Pt is not on blood thinners  No A fib or A flutter Have any cardiac testing pending--no  LOA: independent  No Chew or Snuff tobacco __________________________________________________________________________________________________________________________________________  Constipation: no  Prep: suprep __________________________________________________________________________________________________________________________________________  PV completed with patient. Prep instructions reviewed and provided during apt. Rx sent to preferred pharmacy.  __________________________________________________________________________________________________________________________________________  Patient's chart reviewed by Norleen Schillings CNRA prior to previsit and patient appropriate for the [facility].  Previsit completed and red dot placed by patient's name on their procedure day (on provider's schedule).

## 2024-03-17 ENCOUNTER — Encounter: Payer: Self-pay | Admitting: Internal Medicine

## 2024-03-17 ENCOUNTER — Ambulatory Visit: Admitting: Internal Medicine

## 2024-03-17 VITALS — BP 121/60 | HR 63 | Temp 97.8°F | Resp 13 | Ht 62.0 in | Wt 102.0 lb

## 2024-03-17 DIAGNOSIS — Z860100 Personal history of colon polyps, unspecified: Secondary | ICD-10-CM

## 2024-03-17 DIAGNOSIS — D125 Benign neoplasm of sigmoid colon: Secondary | ICD-10-CM

## 2024-03-17 DIAGNOSIS — D120 Benign neoplasm of cecum: Secondary | ICD-10-CM

## 2024-03-17 DIAGNOSIS — D122 Benign neoplasm of ascending colon: Secondary | ICD-10-CM

## 2024-03-17 DIAGNOSIS — D123 Benign neoplasm of transverse colon: Secondary | ICD-10-CM

## 2024-03-17 MED ORDER — SODIUM CHLORIDE 0.9 % IV SOLN: 500.0000 mL | Freq: Once | INTRAVENOUS | Status: DC

## 2024-03-17 NOTE — Progress Notes (Signed)
 HISTORY OF PRESENT ILLNESS:  Pamela Larson is a 79 y.o. female with a history of multiple adenomatous colon polyps.  Mother with colon cancer.  Multiple prior colonoscopies at [facility] and here.  Now for surveillance colonoscopy.  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    Osteoporosis    Pneumonia    hx of    Rheumatoid arthritis (HCC)     Past Surgical History:  Procedure Laterality Date   APPENDECTOMY  1978   bladder hydrodistention  1998   BREAST SURGERY     reduction    CERVICAL DISC ARTHROPLASTY N/A 04/03/2016   Procedure: CERVICAL SIX CERVICAL SEVEN DISC ARTHROPLASTY;  Surgeon: Pamela Peaches, MD;  Location: [facility];  Service: Neurosurgery;  Laterality: N/A;  left side approach   COSMETIC SURGERY  2005   face lift   EXCISION MORTON'S NEUROMA Left 05/08/2017   Procedure: EXCISION MORTON'S NEUROMA Left 3rd webspace;  Surgeon: Pamela Rush, MD;  Location: [facility];  Service: Orthopedics;  Laterality: Left;   EYE SURGERY     lower eye lids   LUMBAR LAMINECTOMY/DECOMPRESSION MICRODISCECTOMY  06/10/2011   Procedure: LUMBAR LAMINECTOMY/DECOMPRESSION MICRODISCECTOMY 1 LEVEL;  Surgeon: Pamela LELON Peaches, MD;  Location: [facility];  Service: Neurosurgery;  Laterality: Left;  Lumbar Five Sacral One Lumbar Laminectomy Decompression Microdiscectomy    REVERSE SHOULDER  ARTHROPLASTY Right 09/14/2021   Procedure: REVERSE SHOULDER ARTHROPLASTY;  Surgeon: Pamela Kemps, MD;  Location: [facility];  Service: Orthopedics;  Laterality: Right;    Social History Bhumi Godbey [place]  reports that she quit smoking about 34 years ago. Her smoking use included cigarettes. She started smoking about 54 years ago. She has a 40 pack-year smoking history. She has never used smokeless tobacco. She reports current alcohol  use of about 4.0 standard drinks of alcohol  per week. She reports that she does not use drugs.  family history includes Breast cancer in her mother; Cancer in her maternal grandmother; Colon cancer in her mother; Lung cancer in an other family member; Pancreatic cancer in her mother; Rectal cancer in her mother.  Allergies[1]     PHYSICAL EXAMINATION: Vital signs: BP 132/82   Pulse 87   Temp 97.8 F (36.6 C)   Ht 5' 2 (1.575 m)   Wt 102 lb (46.3 kg)   SpO2 94%   BMI 18.66 kg/m  General: Well-developed, well-nourished, no acute distress HEENT: Sclerae are anicteric, conjunctiva pink. Oral mucosa intact Lungs: Clear Heart: Regular Abdomen: soft, nontender, nondistended, no obvious ascites, no peritoneal signs, normal bowel sounds. No organomegaly. Extremities: No edema Psychiatric: alert and oriented x3. Cooperative     ASSESSMENT:  History of multiple adenomatous colon polyps and family history of  colon cancer   PLAN: Surveillance colonoscopy          [1]  Allergies Allergen Reactions   Codeine Nausea And Vomiting and Other (See Comments)    codeine   Amoxicillin Nausea And Vomiting, Rash and Other (See Comments)    Has patient had a PCN reaction causing immediate rash, facial/tongue/throat swelling, SOB or lightheadedness with hypotension: #  #  #  YES  #  #  #   Has patient had a PCN reaction causing severe rash involving mucus membranes or skin necrosis: No  Has patient had a PCN reaction that required hospitalization No  Has  patient had a PCN reaction occurring within the last 10 years: #  #  #  YES  #  #  #   If all of the above answers are NO, then may proceed with Cephalosporin use.   Augmentin [Amoxicillin-Pot Clavulanate] Nausea And Vomiting   Meperidine Hcl Nausea Only   Penicillins Rash   Sulfa Antibiotics Nausea And Vomiting and Rash   Sulfamethoxazole-Trimethoprim Swelling    jittery  sulfamethoxazole / trimethoprim

## 2024-03-17 NOTE — Progress Notes (Signed)
 Pt's states no medical or surgical changes since previsit or office visit.

## 2024-03-17 NOTE — Progress Notes (Signed)
 Called to room to assist during endoscopic procedure.  Patient ID and intended procedure confirmed with present staff. Received instructions for my participation in the procedure from the performing physician.

## 2024-03-17 NOTE — Progress Notes (Signed)
 Transferred to PACU via stretcher.  Not responding to stimulation at this time.  VSS upon leaving procedure room.

## 2024-03-17 NOTE — Patient Instructions (Addendum)
 Repeat colonoscopy in 3 years for surveillance.                           - Patient has a contact number available for                            emergencies. The signs and symptoms of potential                            delayed complications were discussed with the                            patient. Return to normal activities tomorrow.                            Written discharge instructions were provided to the                            patient.                           - Resume previous diet.                           - Continue present medications.                           - Await pathology results.  Handout on polyps given.    YOU HAD AN ENDOSCOPIC PROCEDURE TODAY AT THE [facility]:   Refer to the procedure report that was given to you for any specific questions about what was found during the examination.  If the procedure report does not answer your questions, please call your gastroenterologist to clarify.  If you requested that your care partner not be given the details of your procedure findings, then the procedure report has been included in a sealed envelope for you to review at your convenience later.  YOU SHOULD EXPECT: Some feelings of bloating in the abdomen. Passage of more gas than usual.  Walking can help get rid of the air that was put into your GI tract during the procedure and reduce the bloating. If you had a lower endoscopy (such as a colonoscopy or flexible sigmoidoscopy) you may notice spotting of blood in your stool or on the toilet paper. If you underwent a bowel prep for your procedure, you may not have a normal bowel movement for a few days.  Please Note:  You might notice some irritation and congestion in your nose or some drainage.  This is from the oxygen  used during your procedure.  There is no need for concern and it should clear up in a day or so.  SYMPTOMS TO REPORT IMMEDIATELY:  Following lower endoscopy (colonoscopy or flexible  sigmoidoscopy):  Excessive amounts of blood in the stool  Significant tenderness or worsening of abdominal pains  Swelling of the abdomen that is new, acute  Fever of 100F or higher    For urgent or emergent issues, a gastroenterologist can be reached at any hour by calling [phone]. Do not use MyChart messaging for urgent concerns.    DIET:  We do recommend  a small meal at first, but then you may proceed to your regular diet.  Drink plenty of fluids but you should avoid alcoholic beverages for 24 hours.  ACTIVITY:  You should plan to take it easy for the rest of today and you should NOT DRIVE or use heavy machinery until tomorrow (because of the sedation medicines used during the test).    FOLLOW UP: Our staff will call the number listed on your records the next business day following your procedure.  We will call around 7:15- 8:00 am to check on you and address any questions or concerns that you may have regarding the information given to you following your procedure. If we do not reach you, we will leave a message.     If any biopsies were taken you will be contacted by phone or by letter within the next 1-3 weeks.  Please call us  at [phone] if you have not heard about the biopsies in 3 weeks.    SIGNATURES/CONFIDENTIALITY: You and/or your care partner have signed paperwork which will be entered into your electronic medical record.  These signatures attest to the fact that that the information above on your After Visit Summary has been reviewed and is understood.  Full responsibility of the confidentiality of this discharge information lies with you and/or your care-partner.

## 2024-03-17 NOTE — Op Note (Signed)
 [facility] Patient Name: Pamela Larson Procedure Date: 03/17/2024 1:08 PM MRN: [id number] Endoscopist: Norleen SAILOR. Abran , MD, [id number] Age: 79 Referring MD:  Date of Birth: [birth year] Gender: Female Account #: [id number] Procedure:                Colonoscopy with cold snare polypectomy x 6 Indications:              High risk colon cancer surveillance: Personal                            history of multiple (3 or more) adenomas. Also                            mother with history of colon cancer in her 74s.                            Patient has had multiple prior colonoscopies at                            [facility] and subsequently here in 2014 and most                            recently 2020. Medicines:                Monitored Anesthesia Care Procedure:                Pre-Anesthesia Assessment:                           - Prior to the procedure, a History and Physical                            was performed, and patient medications and                            allergies were reviewed. The patient's tolerance of                            previous anesthesia was also reviewed. The risks                            and benefits of the procedure and the sedation                            options and risks were discussed with the patient.                            All questions were answered, and informed consent                            was obtained. Prior Anticoagulants: The patient has                            taken no anticoagulant or antiplatelet agents. ASA  Grade Assessment: II - A patient with mild systemic                            disease. After reviewing the risks and benefits,                            the patient was deemed in satisfactory condition to                            undergo the procedure.                           After obtaining informed consent, the colonoscope                            was passed under direct  vision. Throughout the                            procedure, the patient's blood pressure, pulse, and                            oxygen  saturations were monitored continuously. The                            CF [id number] #[id number] was introduced through the anus                            and advanced to the the cecum, identified by                            appendiceal orifice and ileocecal valve. The                            ileocecal valve, appendiceal orifice, and rectum                            were photographed. The quality of the bowel                            preparation was excellent. The colonoscopy was                            performed without difficulty. The patient tolerated                            the procedure well. The bowel preparation used was                            SUPREP via split dose instruction. Scope In: 1:52:15 PM Scope Out: 2:09:27 PM Scope Withdrawal Time: 0 hours 14 minutes 18 seconds  Total Procedure Duration: 0 hours 17 minutes 12 seconds  Findings:                 Five polyps were found in the transverse colon,  ascending colon and cecum. The polyps were 3 to 10                            mm in size. These polyps were removed with a cold                            snare. Resection and retrieval were complete.                           Multiple diverticula were found in the sigmoid                            colon and right colon.                           The exam was otherwise without abnormality on                            direct and retroflexion views. Complications:            No immediate complications. Estimated blood loss:                            None. Estimated Blood Loss:     Estimated blood loss: none. Impression:               - Five 3 to 10 mm polyps in the transverse colon,                            in the ascending colon and in the cecum, removed                            with a cold snare.  Resected and retrieved.                           - Diverticulosis in the sigmoid colon and in the                            right colon.                           - The examination was otherwise normal on direct                            and retroflexion views. Recommendation:           - Repeat colonoscopy in 3 years for surveillance.                           - Patient has a contact number available for                            emergencies. The signs and symptoms of potential  delayed complications were discussed with the                            patient. Return to normal activities tomorrow.                            Written discharge instructions were provided to the                            patient.                           - Resume previous diet.                           - Continue present medications.                           - Await pathology results. Norleen SAILOR. Abran, MD 03/17/2024 2:17:59 PM This report has been signed electronically.

## 2024-03-18 ENCOUNTER — Telehealth: Payer: Self-pay | Admitting: *Deleted

## 2024-03-18 NOTE — Telephone Encounter (Signed)
 PT had a procedure yesterday and called to report that she is not feeling well and has a fever. I contacted recovery and was advised by the nurse to have her call the emergency on call doctor at [phone] although we are under operation. PT advised that a note would be sent to get her further assistance.

## 2024-03-18 NOTE — Telephone Encounter (Signed)
 No answer for follow up. Left a message.

## 2024-03-18 NOTE — Telephone Encounter (Signed)
 Pt calling to report not feeling well

## 2024-03-18 NOTE — Telephone Encounter (Signed)
 Talked to Patient and she shared that her temperature is 99.0, elevated from her normal. Advised patient to take tylenol , increase fluid intake and rest. Also gave her the after hours number. Patient verbalized understanding of instructions.

## 2024-04-07 ENCOUNTER — Ambulatory Visit (INDEPENDENT_AMBULATORY_CARE_PROVIDER_SITE_OTHER): Admitting: Otolaryngology

## 2024-06-08 ENCOUNTER — Ambulatory Visit: Admitting: Allergy and Immunology
